# Patient Record
Sex: Male | Born: 1940 | Race: Black or African American | Hispanic: No | Smoking: Former smoker
Health system: Southern US, Community
[De-identification: ages and names within clinical notes are randomized; demographics above are authoritative.]

## PROBLEM LIST (undated history)

## (undated) DIAGNOSIS — H547 Unspecified visual loss: Secondary | ICD-10-CM

## (undated) DIAGNOSIS — E871 Hypo-osmolality and hyponatremia: Secondary | ICD-10-CM

## (undated) DIAGNOSIS — F329 Major depressive disorder, single episode, unspecified: Secondary | ICD-10-CM

## (undated) DIAGNOSIS — E785 Hyperlipidemia, unspecified: Secondary | ICD-10-CM

## (undated) DIAGNOSIS — J189 Pneumonia, unspecified organism: Secondary | ICD-10-CM

## (undated) DIAGNOSIS — N400 Enlarged prostate without lower urinary tract symptoms: Secondary | ICD-10-CM

## (undated) DIAGNOSIS — R600 Localized edema: Secondary | ICD-10-CM

## (undated) DIAGNOSIS — L209 Atopic dermatitis, unspecified: Secondary | ICD-10-CM

## (undated) DIAGNOSIS — M1990 Unspecified osteoarthritis, unspecified site: Secondary | ICD-10-CM

## (undated) DIAGNOSIS — K219 Gastro-esophageal reflux disease without esophagitis: Secondary | ICD-10-CM

## (undated) DIAGNOSIS — E11319 Type 2 diabetes mellitus with unspecified diabetic retinopathy without macular edema: Secondary | ICD-10-CM

## (undated) DIAGNOSIS — F419 Anxiety disorder, unspecified: Secondary | ICD-10-CM

## (undated) DIAGNOSIS — H409 Unspecified glaucoma: Secondary | ICD-10-CM

## (undated) DIAGNOSIS — Z7409 Other reduced mobility: Secondary | ICD-10-CM

## (undated) DIAGNOSIS — B029 Zoster without complications: Secondary | ICD-10-CM

## (undated) DIAGNOSIS — F32A Depression, unspecified: Secondary | ICD-10-CM

## (undated) DIAGNOSIS — I10 Essential (primary) hypertension: Secondary | ICD-10-CM

## (undated) DIAGNOSIS — E559 Vitamin D deficiency, unspecified: Secondary | ICD-10-CM

## (undated) DIAGNOSIS — E1140 Type 2 diabetes mellitus with diabetic neuropathy, unspecified: Secondary | ICD-10-CM

## (undated) DIAGNOSIS — J309 Allergic rhinitis, unspecified: Secondary | ICD-10-CM

## (undated) DIAGNOSIS — R609 Edema, unspecified: Secondary | ICD-10-CM

## (undated) DIAGNOSIS — I340 Nonrheumatic mitral (valve) insufficiency: Secondary | ICD-10-CM

## (undated) DIAGNOSIS — I714 Abdominal aortic aneurysm, without rupture, unspecified: Secondary | ICD-10-CM

## (undated) DIAGNOSIS — E663 Overweight: Secondary | ICD-10-CM

## (undated) DIAGNOSIS — F1010 Alcohol abuse, uncomplicated: Secondary | ICD-10-CM

## (undated) HISTORY — DX: Hyperlipidemia, unspecified: E78.5

## (undated) HISTORY — DX: Overweight: E66.3

## (undated) HISTORY — DX: Atopic dermatitis, unspecified: L20.9

## (undated) HISTORY — DX: Vitamin D deficiency, unspecified: E55.9

## (undated) HISTORY — PX: BACK SURGERY: SHX140

## (undated) HISTORY — DX: Edema, unspecified: R60.9

## (undated) HISTORY — DX: Hypo-osmolality and hyponatremia: E87.1

## (undated) HISTORY — PX: EYE SURGERY: SHX253

## (undated) HISTORY — DX: Type 2 diabetes mellitus with unspecified diabetic retinopathy without macular edema: E11.319

## (undated) HISTORY — DX: Zoster without complications: B02.9

## (undated) HISTORY — DX: Other reduced mobility: Z74.09

## (undated) HISTORY — DX: Type 2 diabetes mellitus with diabetic neuropathy, unspecified: E11.40

## (undated) HISTORY — DX: Abdominal aortic aneurysm, without rupture, unspecified: I71.40

## (undated) HISTORY — PX: PROSTATE SURGERY: SHX751

## (undated) HISTORY — DX: Essential (primary) hypertension: I10

## (undated) HISTORY — DX: Benign prostatic hyperplasia without lower urinary tract symptoms: N40.0

## (undated) HISTORY — DX: Unspecified visual loss: H54.7

## (undated) HISTORY — DX: Localized edema: R60.0

## (undated) HISTORY — DX: Allergic rhinitis, unspecified: J30.9

## (undated) HISTORY — DX: Nonrheumatic mitral (valve) insufficiency: I34.0

## (undated) HISTORY — DX: Unspecified glaucoma: H40.9

---

## 1998-07-25 ENCOUNTER — Encounter: Payer: Self-pay | Admitting: *Deleted

## 1998-07-25 ENCOUNTER — Ambulatory Visit (HOSPITAL_COMMUNITY): Admission: RE | Admit: 1998-07-25 | Discharge: 1998-07-25 | Payer: Self-pay | Admitting: *Deleted

## 1998-08-01 ENCOUNTER — Ambulatory Visit (HOSPITAL_COMMUNITY): Admission: RE | Admit: 1998-08-01 | Discharge: 1998-08-01 | Payer: Self-pay | Admitting: Neurosurgery

## 1998-08-01 ENCOUNTER — Encounter: Payer: Self-pay | Admitting: Neurosurgery

## 1998-08-02 ENCOUNTER — Inpatient Hospital Stay (HOSPITAL_COMMUNITY): Admission: RE | Admit: 1998-08-02 | Discharge: 1998-08-07 | Payer: Self-pay | Admitting: Neurosurgery

## 1998-08-03 ENCOUNTER — Encounter: Payer: Self-pay | Admitting: Neurosurgery

## 1998-08-23 ENCOUNTER — Encounter: Admission: RE | Admit: 1998-08-23 | Discharge: 1998-11-21 | Payer: Self-pay | Admitting: Internal Medicine

## 2001-02-25 ENCOUNTER — Ambulatory Visit (HOSPITAL_COMMUNITY): Admission: RE | Admit: 2001-02-25 | Discharge: 2001-02-25 | Payer: Self-pay | Admitting: Gastroenterology

## 2009-12-30 ENCOUNTER — Encounter: Payer: Self-pay | Admitting: Physician Assistant

## 2009-12-30 ENCOUNTER — Emergency Department (HOSPITAL_COMMUNITY): Admission: EM | Admit: 2009-12-30 | Discharge: 2009-12-30 | Payer: Self-pay | Admitting: Emergency Medicine

## 2009-12-30 LAB — CONVERTED CEMR LAB
HCT: 44.7 %
Hemoglobin: 15.2 g/dL
Platelets: 199 10*3/uL
WBC: 5.8 10*3/uL

## 2010-02-07 ENCOUNTER — Ambulatory Visit: Payer: Self-pay | Admitting: Physician Assistant

## 2010-02-07 ENCOUNTER — Telehealth (INDEPENDENT_AMBULATORY_CARE_PROVIDER_SITE_OTHER): Payer: Self-pay | Admitting: *Deleted

## 2010-02-07 DIAGNOSIS — E785 Hyperlipidemia, unspecified: Secondary | ICD-10-CM | POA: Insufficient documentation

## 2010-02-07 DIAGNOSIS — I10 Essential (primary) hypertension: Secondary | ICD-10-CM | POA: Insufficient documentation

## 2010-02-07 DIAGNOSIS — M48061 Spinal stenosis, lumbar region without neurogenic claudication: Secondary | ICD-10-CM

## 2010-02-07 DIAGNOSIS — F1010 Alcohol abuse, uncomplicated: Secondary | ICD-10-CM | POA: Insufficient documentation

## 2010-02-07 DIAGNOSIS — E119 Type 2 diabetes mellitus without complications: Secondary | ICD-10-CM | POA: Insufficient documentation

## 2010-02-07 LAB — CONVERTED CEMR LAB: Hgb A1c MFr Bld: >14 %

## 2010-02-09 LAB — CONVERTED CEMR LAB
ALT: 14 units/L (ref 0–53)
AST: 20 units/L (ref 0–37)
Albumin: 4.3 g/dL (ref 3.5–5.2)
Alkaline Phosphatase: 58 units/L (ref 39–117)
BUN: 14 mg/dL (ref 6–23)
CO2: 24 meq/L (ref 19–32)
Calcium: 9.4 mg/dL (ref 8.4–10.5)
Chloride: 99 meq/L (ref 96–112)
Creatinine, Ser: 0.82 mg/dL (ref 0.40–1.50)
Free T4: 1.43 ng/dL (ref 0.80–1.80)
Glucose, Bld: 329 mg/dL — ABNORMAL HIGH (ref 70–99)
Potassium: 3.6 meq/L (ref 3.5–5.3)
Sodium: 138 meq/L (ref 135–145)
TSH: 2.366 microintl units/mL (ref 0.350–4.500)
Total Bilirubin: 0.6 mg/dL (ref 0.3–1.2)
Total Protein: 6.8 g/dL (ref 6.0–8.3)

## 2010-02-11 ENCOUNTER — Ambulatory Visit: Payer: Self-pay | Admitting: Physician Assistant

## 2010-02-11 ENCOUNTER — Ambulatory Visit (HOSPITAL_COMMUNITY): Admission: RE | Admit: 2010-02-11 | Discharge: 2010-02-11 | Payer: Self-pay | Admitting: Internal Medicine

## 2010-02-14 ENCOUNTER — Telehealth: Payer: Self-pay | Admitting: Physician Assistant

## 2010-02-14 DIAGNOSIS — D179 Benign lipomatous neoplasm, unspecified: Secondary | ICD-10-CM | POA: Insufficient documentation

## 2010-02-18 ENCOUNTER — Ambulatory Visit: Payer: Self-pay | Admitting: Physician Assistant

## 2010-02-20 ENCOUNTER — Telehealth (INDEPENDENT_AMBULATORY_CARE_PROVIDER_SITE_OTHER): Payer: Self-pay | Admitting: *Deleted

## 2010-02-27 ENCOUNTER — Encounter: Payer: Self-pay | Admitting: Physician Assistant

## 2010-02-28 ENCOUNTER — Telehealth: Payer: Self-pay | Admitting: Physician Assistant

## 2010-03-04 ENCOUNTER — Encounter: Payer: Self-pay | Admitting: Physician Assistant

## 2010-03-07 ENCOUNTER — Ambulatory Visit: Payer: Self-pay | Admitting: Physician Assistant

## 2010-03-07 LAB — CONVERTED CEMR LAB
BUN: 11 mg/dL (ref 6–23)
Blood Glucose, Fingerstick: 116
CO2: 28 meq/L (ref 19–32)
Calcium: 9.4 mg/dL (ref 8.4–10.5)
Chloride: 102 meq/L (ref 96–112)
Creatinine, Ser: 0.88 mg/dL (ref 0.40–1.50)
Glucose, Bld: 108 mg/dL — ABNORMAL HIGH (ref 70–99)
Potassium: 3.8 meq/L (ref 3.5–5.3)
Sodium: 144 meq/L (ref 135–145)

## 2010-03-10 ENCOUNTER — Encounter: Payer: Self-pay | Admitting: Physician Assistant

## 2010-03-11 ENCOUNTER — Encounter: Payer: Self-pay | Admitting: Physician Assistant

## 2010-03-12 ENCOUNTER — Encounter (INDEPENDENT_AMBULATORY_CARE_PROVIDER_SITE_OTHER): Payer: Self-pay | Admitting: Nurse Practitioner

## 2010-03-17 ENCOUNTER — Ambulatory Visit: Payer: Self-pay | Admitting: Physician Assistant

## 2010-03-18 DIAGNOSIS — E876 Hypokalemia: Secondary | ICD-10-CM | POA: Insufficient documentation

## 2010-03-18 LAB — CONVERTED CEMR LAB
BUN: 12 mg/dL (ref 6–23)
CO2: 26 meq/L (ref 19–32)
Calcium: 9.6 mg/dL (ref 8.4–10.5)
Chloride: 99 meq/L (ref 96–112)
Creatinine, Ser: 0.84 mg/dL (ref 0.40–1.50)
Glucose, Bld: 163 mg/dL — ABNORMAL HIGH (ref 70–99)
Potassium: 3.4 meq/L — ABNORMAL LOW (ref 3.5–5.3)
Sodium: 138 meq/L (ref 135–145)

## 2010-03-31 ENCOUNTER — Ambulatory Visit: Payer: Self-pay | Admitting: Physician Assistant

## 2010-03-31 ENCOUNTER — Telehealth (INDEPENDENT_AMBULATORY_CARE_PROVIDER_SITE_OTHER): Payer: Self-pay | Admitting: Nurse Practitioner

## 2010-04-01 LAB — CONVERTED CEMR LAB
BUN: 10 mg/dL (ref 6–23)
CO2: 31 meq/L (ref 19–32)
Calcium: 9.2 mg/dL (ref 8.4–10.5)
Chloride: 95 meq/L — ABNORMAL LOW (ref 96–112)
Creatinine, Ser: 0.83 mg/dL (ref 0.40–1.50)
Glucose, Bld: 116 mg/dL — ABNORMAL HIGH (ref 70–99)
Potassium: 3.1 meq/L — ABNORMAL LOW (ref 3.5–5.3)
Sodium: 139 meq/L (ref 135–145)

## 2010-04-04 ENCOUNTER — Telehealth (INDEPENDENT_AMBULATORY_CARE_PROVIDER_SITE_OTHER): Payer: Self-pay | Admitting: Internal Medicine

## 2010-04-14 ENCOUNTER — Encounter (INDEPENDENT_AMBULATORY_CARE_PROVIDER_SITE_OTHER): Payer: Self-pay | Admitting: General Surgery

## 2010-04-14 ENCOUNTER — Ambulatory Visit (HOSPITAL_COMMUNITY)
Admission: RE | Admit: 2010-04-14 | Discharge: 2010-04-15 | Payer: Self-pay | Source: Home / Self Care | Admitting: General Surgery

## 2010-04-18 ENCOUNTER — Encounter (INDEPENDENT_AMBULATORY_CARE_PROVIDER_SITE_OTHER): Payer: Self-pay | Admitting: Nurse Practitioner

## 2010-04-22 ENCOUNTER — Encounter (INDEPENDENT_AMBULATORY_CARE_PROVIDER_SITE_OTHER): Payer: Self-pay | Admitting: Internal Medicine

## 2010-05-12 ENCOUNTER — Ambulatory Visit: Payer: Self-pay | Admitting: Nurse Practitioner

## 2010-05-12 DIAGNOSIS — G609 Hereditary and idiopathic neuropathy, unspecified: Secondary | ICD-10-CM

## 2010-05-12 LAB — CONVERTED CEMR LAB
Bilirubin Urine: NEGATIVE
Blood Glucose, Fingerstick: 121
Blood in Urine, dipstick: NEGATIVE
Cholesterol, target level: 200 mg/dL
Glucose, Urine, Semiquant: NEGATIVE
HDL goal, serum: 40 mg/dL
Ketones, urine, test strip: NEGATIVE
LDL Goal: 100 mg/dL
Nitrite: NEGATIVE
Protein, U semiquant: NEGATIVE
Specific Gravity, Urine: 1.01
Urobilinogen, UA: 0.2
WBC Urine, dipstick: NEGATIVE
pH: 7.5

## 2010-05-13 LAB — CONVERTED CEMR LAB
Cholesterol: 154 mg/dL (ref 0–200)
HDL: 46 mg/dL (ref >39)
Hgb A1c MFr Bld: 7 % — ABNORMAL HIGH (ref <5.7)
LDL Cholesterol: 88 mg/dL (ref 0–99)
Microalb, Ur: 0.99 mg/dL (ref 0.00–1.89)
Total CHOL/HDL Ratio: 3.3
Triglycerides: 102 mg/dL (ref <150)
VLDL: 20 mg/dL (ref 0–40)

## 2010-05-15 ENCOUNTER — Encounter (INDEPENDENT_AMBULATORY_CARE_PROVIDER_SITE_OTHER): Payer: Self-pay | Admitting: Nurse Practitioner

## 2010-05-27 ENCOUNTER — Inpatient Hospital Stay (HOSPITAL_COMMUNITY)
Admission: RE | Admit: 2010-05-27 | Discharge: 2010-05-30 | Payer: Self-pay | Source: Home / Self Care | Attending: Neurosurgery | Admitting: Neurosurgery

## 2010-06-03 ENCOUNTER — Encounter (INDEPENDENT_AMBULATORY_CARE_PROVIDER_SITE_OTHER): Payer: Self-pay | Admitting: Nurse Practitioner

## 2010-06-11 ENCOUNTER — Encounter (INDEPENDENT_AMBULATORY_CARE_PROVIDER_SITE_OTHER): Payer: Self-pay | Admitting: Nurse Practitioner

## 2010-06-19 ENCOUNTER — Ambulatory Visit
Admission: RE | Admit: 2010-06-19 | Discharge: 2010-06-20 | Payer: Self-pay | Source: Home / Self Care | Attending: Urology | Admitting: Urology

## 2010-06-19 DIAGNOSIS — N401 Enlarged prostate with lower urinary tract symptoms: Secondary | ICD-10-CM | POA: Insufficient documentation

## 2010-06-19 LAB — GLUCOSE, CAPILLARY: Glucose-Capillary: 134 mg/dL — ABNORMAL HIGH (ref 70–99)

## 2010-06-19 LAB — POCT I-STAT 4, (NA,K, GLUC, HGB,HCT)
Glucose, Bld: 121 mg/dL — ABNORMAL HIGH (ref 70–99)
HCT: 38 % — ABNORMAL LOW (ref 39.0–52.0)
Hemoglobin: 12.9 g/dL — ABNORMAL LOW (ref 13.0–17.0)
Potassium: 3.8 mEq/L (ref 3.5–5.1)
Sodium: 129 mEq/L — ABNORMAL LOW (ref 135–145)

## 2010-06-20 ENCOUNTER — Encounter (INDEPENDENT_AMBULATORY_CARE_PROVIDER_SITE_OTHER): Payer: Self-pay | Admitting: Nurse Practitioner

## 2010-06-20 LAB — GLUCOSE, CAPILLARY: Glucose-Capillary: 124 mg/dL — ABNORMAL HIGH (ref 70–99)

## 2010-06-20 LAB — BASIC METABOLIC PANEL
BUN: 10 mg/dL (ref 6–23)
CO2: 29 mEq/L (ref 19–32)
Calcium: 8.6 mg/dL (ref 8.4–10.5)
Chloride: 93 mEq/L — ABNORMAL LOW (ref 96–112)
Creatinine, Ser: 0.83 mg/dL (ref 0.4–1.5)
GFR calc Af Amer: >60 mL/min (ref >60)
GFR calc non Af Amer: >60 mL/min (ref >60)
Glucose, Bld: 100 mg/dL — ABNORMAL HIGH (ref 70–99)
Potassium: 3.9 mEq/L (ref 3.5–5.1)
Sodium: 128 mEq/L — ABNORMAL LOW (ref 135–145)

## 2010-06-21 ENCOUNTER — Emergency Department (HOSPITAL_COMMUNITY)
Admission: EM | Admit: 2010-06-21 | Discharge: 2010-06-21 | Payer: Self-pay | Source: Home / Self Care | Admitting: Emergency Medicine

## 2010-06-27 ENCOUNTER — Encounter (INDEPENDENT_AMBULATORY_CARE_PROVIDER_SITE_OTHER): Payer: Self-pay | Admitting: Nurse Practitioner

## 2010-06-27 DIAGNOSIS — R220 Localized swelling, mass and lump, head: Secondary | ICD-10-CM | POA: Insufficient documentation

## 2010-06-27 DIAGNOSIS — R221 Localized swelling, mass and lump, neck: Secondary | ICD-10-CM | POA: Insufficient documentation

## 2010-07-17 NOTE — Progress Notes (Signed)
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 I don't like how it is wrapped around and next to certain important structures in his neck. I want him to see a surgeon to evaluate this. Referral in system. Send to Ar[facility] Medici once he is notified.  Initial call taken by: Tereso Newcomer PA-C,  February 14, 2010 1:24 PM  Follow-up for Phone Call        Left message for the pt. to call the office... Linzie Collin CMA  Student February 14, 2010 5:12 PM   pt is aware... [place] Shannon  February 18, 2010 4:00 PM    New Problems: LIPOMA (ICD-214.9)   New Problems: LIPOMA (ICD-214.9)     Past History:  Past Medical History: Diabetes mellitus, type II Hypertension Spinal Stenosis Hyperlipidemia ? h/o Colon Cancer   a.  first told he had cancer then had repeat colo and was told he did not have cancer   b.  record of colo done in 2002 avail; + hem's; no mention of polyps   c.  states last colo 2007(?) Large right neck lipoma   a.  CT 01/2010: Large lipoma in the right neck.  This appears to be an intramuscular lipoma of the        sternocleidomastoid muscle and appears to be a simple lipoma.  No soft tissue mass.   Impression & Recommendations:  Problem # 1:  LIPOMA (ICD-214.9)  refer to surgery  Orders: Surgical Referral (Surgery)  Complete Medication List: 1)  Metformin Hcl 500 Mg Tabs (Metformin hcl) .... Take 1 tablet by mouth two times a day for diabetes 2)  Lantus 100 Unit/ml Soln (Insulin glargine) .... Inject 10 units at bedtime for diabetes 3)  Insulin Syringes  .... Inject as directed 4)  Glucose Meter  .... Check sugar three times a day before each meal 5)  Glucose Meter Strips  .... Check sugar three times a day before each meal 6)  Lancets  .... Check sugar three times a day 7)  Lisinopril 20 Mg Tabs (Lisinopril) .... Take 1 tablet by mouth once a day for blood  pressure 8)  Oxycodone Hcl 5 Mg Tabs (Oxycodone hcl) .... Take 1-2 tabs every 8 hours as needed for severe pain

## 2010-07-17 NOTE — Letter (Signed)
Summary: OFFICE NOTE DR. FAERA BYERLY  OFFICE NOTE DR. FAERA BYERLY   Imported By: Arta Bruce 03/24/2010 10:01:35  _____________________________________________________________________  External Attachment:    Type:   Image     Comment:   External Document

## 2010-07-17 NOTE — Letter (Signed)
Summary: TEST ORDER FORM  TEST ORDER FORM   Imported By: Arta Bruce 02/07/2010 14:34:55  _____________________________________________________________________  External Attachment:    Type:   Image     Comment:   External Document

## 2010-07-17 NOTE — Medication Information (Signed)
Summary: [facility]   Imported By: Arta Bruce 03/10/2010 10:50:13  _____________________________________________________________________  External Attachment:    Type:   Image     Comment:   External Document

## 2010-07-17 NOTE — Progress Notes (Signed)
  Phone Note From Pharmacy   Caller: [facility] Summary of Call: Request for refill of KCl, though should have refills--will send in new Rx.  Looks like pt. received a written Rx and did not take in--please call him and have him toss the one he has if still in his possession Initial call taken by: Julieanne Manson MD,  April 04, 2010 9:13 AM  Follow-up for Phone Call        spoke with pt and he said that he pick up his med from the pharamcy yesterday Follow-up by: [place] Shannon,  April 04, 2010 9:45 AM    Prescriptions: KLOR-CON M20 20 MEQ CR-TABS (POTASSIUM CHLORIDE CRYS CR) Take 1 tablet by mouth once a day  #30 x 3   Entered and Authorized by:   Julieanne Manson MD   Signed by:   Julieanne Manson MD on 04/04/2010   Method used:   Electronically to        [facility] (retail)       [address]       [place], [place]  [id number]       Ph: [phone]       Fax: [phone]   RxID:   [id number]

## 2010-07-17 NOTE — Assessment & Plan Note (Signed)
Summary: Diabetes/HTN   Vital Signs:  Patient profile:   70 year old male Height:      69.25 inches Weight:      180.0 pounds BMI:     26.49 BSA:     1.98 Temp:     97.3 degrees F oral Pulse rate:   112 / minute Pulse rhythm:   regular Resp:     20 per minute BP sitting:   122 / 90  (left arm) Cuff size:   large  Vitals Entered By: Levon Hedger (May 12, 2010 11:47 AM)  Nutrition Counseling: Patient's BMI is greater than 25 and therefore counseled on weight management options. CC: follow-up visit BP, DM, Hypertension Management, Lipid Management Is Patient Diabetic? Yes Pain Assessment Patient in pain? no      CBG Result 121 CBG Device ID B  Does patient need assistance? Functional Status Self care Ambulation Normal  Diabetic Foot Exam Last Podiatry Exam Date: 03/07/2010 Foot Inspection Is there a history of a foot ulcer?              No Is there a foot ulcer now?              No Can the patient see the bottom of their feet?          No Are the shoes appropriate in style and fit?          Yes Is there swelling or an abnormal foot shape?          No Are the toenails long?                Yes Are the toenails thick?                Yes Are the toenails ingrown?              No Is there heavy callous build-up?              No Is there pain in the calf muscle (Intermittent claudication) when walking?    NoIs there a claw toe deformity?              No Is there elevated skin temperature?            No Is there limited ankle dorsiflexion?            No Is there foot or ankle muscle weakness?            No  Diabetic Foot Care Education Patient educated on appropriate care of diabetic feet.  Pulse Check          Right Foot          Left Foot Dorsalis Pedis:        normal            normal  High Risk Feet? Yes   10-g (5.07) Semmes-Weinstein Monofilament Test Performed by: Levon Hedger          Right Foot          Left Foot Visual Inspection                  Primary Care Vertis Bauder:  Tereso Newcomer, PA-C  CC:  follow-up visit BP, DM, Hypertension Management, and Lipid Management.  History of Present Illness:  Pt into the office for diabetes and htn  s/p lipoma removal about 3 weeks ago. healing well  Diabetes Management History:      The patient is  a 70 years old male who comes in for evaluation of DM Type 2.  He has not been enrolled in the "Diabetic Education Program".  He states understanding of dietary principles and is following his diet appropriately.  No sensory loss is reported.  Self foot exams are not being performed.  He is checking home blood sugars.        Other comments include: Pt is checking blood sugar twice daily and in the morning.        No changes have been made to his treatment plan since last visit.    Hypertension History:      He denies headache, chest pain, and palpitations.  He notes no problems with any antihypertensive medication side effects.        Positive major cardiovascular risk factors include male age 68 years old or older, diabetes, hyperlipidemia, and hypertension.  Negative major cardiovascular risk factors include non-tobacco-user status.        Further assessment for target organ damage reveals no history of peripheral vascular disease.    Lipid Management History:      Positive NCEP/ATP III risk factors include male age 4 years old or older, diabetes, and hypertension.  Negative NCEP/ATP III risk factors include non-tobacco-user status and no peripheral vascular disease.        The patient states that he does not know about the "Therapeutic Lifestyle Change" diet.  The patient does not know about adjunctive measures for cholesterol lowering.  He expresses no side effects from his lipid-lowering medication.  The patient denies any symptoms to suggest myopathy or liver disease.      Habits & Providers  Alcohol-Tobacco-Diet     Alcohol drinks/day: 0     Alcohol Counseling: to decrease amount and/or  frequency of alcohol intake     Tobacco Status: quit  Exercise-Depression-Behavior     Have you felt down or hopeless? no     Have you felt little pleasure in things? no     Drug Use: no  Allergies: 1)  ! Penicillin G Potassium (Penicillin G Potassium)  Review of Systems General:  Denies fever. CV:  Denies chest pain or discomfort. Resp:  Denies cough. GI:  Denies abdominal pain, nausea, and vomiting. MS:  Complains of low back pain; pt is going to have back surgery on next month - by Dr. Channing Mutters at [facility]. Neuro:  Complains of numbness and tingling; bilateral feet.  Physical Exam  General:  alert.   Head:  normocephalic.   Mouth:  poor dentition.   Lungs:  normal breath sounds.   Heart:  normal rate and regular rhythm.   Neurologic:  cane use Skin:  right - carotid healed incision Psych:  Oriented X3.    Diabetes Management Exam:    Foot Exam (with socks and/or shoes not present):       Sensory-Monofilament:          Left foot: abnormal          Right foot: abnormal       Nails:          Left foot: normal          Right foot: normal   Impression & Recommendations:  Problem # 1:  DIABETES MELLITUS, TYPE II (ICD-250.00) will check hgba1c may need titration of meds ? neuropathy due to uncontrolled blood sugar His updated medication list for this problem includes:    Metformin Hcl 500 Mg Tabs (Metformin hcl) .[name]... Take 1 tablet by  mouth two times a day for diabetes    Lantus 100 Unit/ml Soln (Insulin glargine) ..... Inject 10 units at bedtime for diabetes    Lisinopril 20 Mg Tabs (Lisinopril) .[name]... Take 1 tablet by mouth once a day for blood pressure  Orders: UA Dipstick w/o Micro (manual) (29528) T- Hemoglobin A1C (41324-40102)  Problem # 2:  HYPERTENSION (ICD-401.9) BP is stable continue current meds His updated medication list for this problem includes:    Lisinopril 20 Mg Tabs (Lisinopril) .[name]... Take 1 tablet by mouth once a day for blood pressure     Hydrochlorothiazide 25 Mg Tabs (Hydrochlorothiazide) .[name]... 1 tab by mouth once daily for blood pressure  Problem # 3:  HYPERLIPIDEMIA (ICD-272.4) will check labs today Orders: T-Lipid Profile ([id number]) T-Urine Microalbumin w/creat. 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 Take 1 tablet by mouth two times a day for diabetes 2)  Lantus 100 Unit/ml Soln (Insulin glargine) .... Inject 10 units at bedtime for diabetes 3)  Insulin Syringes  .... Inject as directed 4)  Glucose Meter  .... Check sugar three times a day before each meal 5)  Glucose Meter Strips  .... Check sugar three times a day before each meal 6)  Lancets  .... Check sugar three times a day 7)  Lisinopril 20 Mg Tabs (Lisinopril) .... Take 1 tablet by mouth once a day for blood pressure 8)  Oxycodone Hcl 5 Mg Tabs (Oxycodone hcl) .... Take 1-2 tabs every 8 hours as needed for severe pain 9)  Hydrochlorothiazide 25 Mg Tabs (Hydrochlorothiazide) .[name].. 1 tab by mouth once daily for blood pressure 10)  Klor-con M20 20 Meq Cr-tabs (Potassium chloride crys cr) .... Take 1 tablet by mouth once a day  Diabetes Management Assessment/Plan:      The following lipid goals have been established for the patient: Total cholesterol goal of 200; LDL cholesterol goal of 100; HDL cholesterol goal of 40; Triglyceride goal of 150.  His blood pressure goal is < 130/80.    Hypertension Assessment/Plan:      The patient's hypertensive risk group is category C: Target organ damage and/or diabetes.  Today's blood pressure is 122/90.  His blood pressure goal is < 130/80.  Lipid Assessment/Plan:      Based on NCEP/ATP III, the patient's risk factor category is "history of diabetes".  The patient's lipid goals are as follows: Total cholesterol goal is 200;  LDL cholesterol goal is 100; HDL cholesterol goal is 40; Triglyceride goal is 150.    Patient Instructions: 1)  Diabetes - your Hgba1c (long term blood sugar value) will be checked today and you will be notified of the results.  The goal is less than 7.  If still elevated you will be notified of a medication change 2)  It was 14 back in august 3)  uncontrolled blood sugar may be the cause of your feet tingling - neuropathy - read handout.  Getting blood sugar under good control is key. There are some medications you can take if it continues to be a problems 4)  Blood pressure - doing well today 5)  Keep your appointment for back surgery as ordered 6)  CALL for a follow up appointment in 3 months for diabetes.   Orders Added: 1)  Est. Patient Level IV [59563]  2)  T-Lipid Profile [80061-22930] 3)  T-Urine Microalbumin w/creat. ratio [[id number]] 4)  UA Dipstick w/o Micro (manual) [81002] 5)  T- Hemoglobin A1C [83036-23375]    Laboratory Results   Urine Tests  Date/Time Received: May 12, 2010 12:03 PM   Routine Urinalysis   Color: lt. yellow Appearance: Clear Glucose: negative   (Normal Range: Negative) Bilirubin: negative   (Normal Range: Negative) Ketone: negative   (Normal Range: Negative) Spec. Gravity: 1.010   (Normal Range: 1.003-1.035) Blood: negative   (Normal Range: Negative) pH: 7.5   (Normal Range: 5.0-8.0) Protein: negative   (Normal Range: Negative) Urobilinogen: 0.2   (Normal Range: 0-1) Nitrite: negative   (Normal Range: Negative) Leukocyte Esterace: negative   (Normal Range: Negative)     Blood Tests     CBG Random:: 121            Diabetic Foot Exam Last Podiatry Exam Date: 03/07/2010 Diabetic Foot Care Education :Patient educated on appropriate care of diabetic feet.  Pulse Check          Right Foot          Left Foot Dorsalis Pedis:        normal            normal  High Risk Feet? Yes   10-g (5.07) Semmes-Weinstein Monofilament  Test Performed by: Levon Hedger          Right Foot          Left Foot Visual Inspection               Test Control      abnormal         abnormal Site 1         normal         normal Site 2         normal         abnormal Site 3         abnormal         abnormal Site 4         abnormal         abnormal Site 5         abnormal         abnormal Site 6         abnormal         abnormal Site 7         abnormal         abnormal Site 8         abnormal         abnormal Site 9         abnormal         abnormal Site 10         abnormal         abnormal  Impression      abnormal         abnormal

## 2010-07-17 NOTE — Letter (Signed)
Summary: *HSN Results Follow up  [facility]  [address] [place], [place] [address]   Phone: [phone]  Fax: [phone]      03/11/2010   Donald Calderon [address] [place], [place]  [address]   Dear  Mr. Donald Calderon,                            ____S.Drinkard,FNP   ____D. Gore,FNP       ____B. McPherson,MD   ____V. Rankins,MD    ____E. Mulberry,MD    ____N. Daphine Deutscher, FNP  ____D. Reche Dixon, MD    ____K. Philipp Deputy, MD    __x__S. Alben Spittle, PA-C     This letter is to inform you that your recent test(s):  _______Pap Smear    ___x____Lab Test     _______X-ray    ___x____ is within acceptable limits  _______ requires a medication change  _______ requires a follow-up lab visit  ___x____ requires a follow-up visit with your provider   Comments: Kidney function and potassium ok.  Make sure you come in for your follow up labs in a couple weeks.       _________________________________________________________ If you have any questions, please contact our office                     Sincerely,  Donald Newcomer PA-C [facility]

## 2010-07-17 NOTE — Letter (Signed)
Summary: [facility]  [facility]   Imported By: Arta Bruce 04/18/2010 11:32:51  _____________________________________________________________________  External Attachment:    Type:   Image     Comment:   External Document

## 2010-07-17 NOTE — Letter (Signed)
Summary: AGREEMENT FOR CONTROLL SUBANCES  AGREEMENT FOR CONTROLL SUBANCES   Imported By: Arta Bruce 02/07/2010 14:24:54  _____________________________________________________________________  External Attachment:    Type:   Image     Comment:   External Document

## 2010-07-17 NOTE — Miscellaneous (Signed)
Summary: Dx update  Clinical Lists Changes  Problems: Changed problem from SWELLING MASS OR LUMP IN HEAD AND NECK (ICD-784.2) to SWELLING MASS OR LUMP IN HEAD AND NECK (ICD-784.2) - Removed 05/2010 by Laurelyn Sickle

## 2010-07-17 NOTE — Progress Notes (Signed)
Summary: Surgery appt  Phone Note Outgoing Call   Summary of Call: Pls make sure patient knows about appt 9.20.2011 with [facility] at 2:10 pm, Initial call taken by: Brynda Rim,  February 28, 2010 2:13 PM  Follow-up for Phone Call        Left message on answering machine for pt to call back.Marland KitchenMarland KitchenMarland KitchenArmenia Shannon  February 28, 2010 3:27 PM   pt is aware  Follow-up by: [place] Shannon,  March 03, 2010 12:10 PM

## 2010-07-17 NOTE — Letter (Signed)
Summary: Handout Printed  Printed Handout:  - Neuropathy 

## 2010-07-17 NOTE — Miscellaneous (Signed)
Summary: [facility] Results   Diabetes Management Exam:    Eye Exam:       Eye Exam done elsewhere          Date: 03/12/2010          Results: ? Glaucome          Done by: retasure Phone Note Outgoing Call   Summary of Call: Contact pt and see if anyone every called him regarding his retasure results done 03/12/2010 looks like it was abnormal, ? glaucoma pt should try to schedule an eye exam with an eye doctor in the next 3 months to get this further tested Initial call taken by: Lehman Prom FNP,  April 18, 2010 8:17 AM  Follow-up for Phone Call        pt is aware Follow-up by: [place] Shannon,  April 18, 2010 1:02 PM    Clinical Lists Changes  Observations: Added new observation of EYES COMMENT: 03/2011 (04/18/2010 8:15) Added new observation of EYE EXAM BY: [facility] (03/12/2010 8:16) Added new observation of DMEYEEXMRES: ? Glaucome (03/12/2010 8:16) Added new observation of DIAB EYE EX: ? Glaucome (03/12/2010 8:16)

## 2010-07-17 NOTE — Progress Notes (Signed)
Summary: [facility]  Phone Note Other Incoming   Caller: [facility] Summary of Call: pt had labs drawn on 8/26 they need to verify pt insurance. contact [phone] ext [phone]  Initial call taken by: Levon Hedger,  February 20, 2010 12:38 PM  Follow-up for Phone Call        Pt was self-pay.

## 2010-07-17 NOTE — Consult Note (Signed)
Summary: DR.FAERA BYERLY  DR.FAERA BYERLY   Imported By: Arta Bruce 06/27/2010 14:55:40  _____________________________________________________________________  External Attachment:    Type:   Image     Comment:   External Document

## 2010-07-17 NOTE — Progress Notes (Signed)
Summary: Neurosurgery referral  Phone Note Outgoing Call   Summary of Call: Refer to neurosurgery for spinal stenosis.  Initial call taken by: Brynda Rim,  February 07, 2010 4:16 PM  Follow-up for Phone Call        SEND A REFERRAL WAITING FOR AN APPT   Follow-up by: Cheryll Dessert,  February 11, 2010 11:33 AM

## 2010-07-17 NOTE — Assessment & Plan Note (Signed)
Summary: NEW MEDICARE PT/ DM/ GK   Vital Signs:  Patient profile:   70 year old male Weight:      193 pounds (87.73 kg) Temp:     98.6 degrees F (37.00 degrees C) oral Pulse rate:   72 / minute Pulse rhythm:   regular Resp:     18 per minute BP sitting:   180 / 120  (left arm) Cuff size:   large  Vitals Entered By: Armenia Shannon (February 07, 2010 10:23 AM) CC: np... Is Patient Diabetic? Yes Pain Assessment Patient in pain? no       Does patient need assistance? Functional Status Self care Ambulation Normal   Primary Care Provider:  Tereso Newcomer, PA-C  CC:  np....  History of Present Illness: New Patient. Went to ED recently with back pain.  Has a h/o low back surgery.  Got MRI that demonstrated spinal stenosis.  Has increasingly worse back pain over last 6 monts.  Has pain down back of leg on left in L5-S1 region.  Notes bending forward makes better.  Pain increases the longer he is on his feet.  He denies loss of bowel or bladder function.    No meds in a long time for diabetes or HTN.    Habits & Providers  Alcohol-Tobacco-Diet     Tobacco Status: quit  Exercise-Depression-Behavior     Drug Use: no  Allergies (verified): 1)  ! Penicillin G Potassium (Penicillin G Potassium)  Past History:  Past Medical History: Diabetes mellitus, type II Hypertension Spinal Stenosis Hyperlipidemia ? h/o Colon Cancer   a.  first told he had cancer then had repeat colo and was told he did not have cancer   b.  record of colo done in 2002 avail; + hem's; no mention of polyps   c.  states last colo 2007(?)  Past Surgical History: Lumbar laminectomy   a.  L4-5; 2003; Dr. Channing Mutters   b.  MRI 12/2009:  L4-5 prior laminectomy; mod disc degen; L3-4 mod-severe spinal stenosis s/p crainial surgery by Dr. Phoebe Perch (congenital CSF leak - ?)   a.  no record in [place]  Family History: Family History Diabetes 1st degree relative  Social History: Married Retired prior Curator Former  Smoker Alcohol use-yes   a.  4 shots of whiskey per day   b.  CAGE - + C, G, E Drug use-no Smoking Status:  quit Drug Use:  no  Review of Systems      See HPI General:  Denies fever and sweats. ENT:  Denies difficulty swallowing. CV:  Denies chest pain or discomfort and shortness of breath with exertion. Resp:  Denies cough. GI:  Denies bloody stools. GU:  Denies hematuria. Neuro:  Denies headaches; + dizziness with increased BP. Endo:  Complains of polyuria.  Physical Exam  General:  alert, well-developed, and well-nourished.   Head:  normocephalic and atraumatic.   Eyes:  pupils equal, pupils round, and pupils reactive to light.  diff to visualize fundi bilat  Mouth:  pharynx pink and moist.   Neck:  very large soft mass on right neck smaller soft mass c/w probable lipomas right post scalp  Lungs:  normal breath sounds and no wheezes.   Heart:  normal rate, regular rhythm, and no murmur.   Abdomen:  soft, non-tender, and no hepatomegaly.   Extremities:  trace left pedal edema and trace right pedal edema.   Neurologic:  alert & oriented X3 and cranial nerves II-XII intact.  Psych:  normally interactive and good eye contact.     Impression & Recommendations:  Problem # 1:  DIABETES MELLITUS, TYPE II (ICD-250.00)  no tx for years creat 0.76 in ED in July start back on Metformin also start Lantus Insulin bring back with Susie next week for diet and insulin instruction  His updated medication list for this problem includes:    Metformin Hcl 500 Mg Tabs (Metformin hcl) .[name]... Take 1 tablet by mouth two times a day for diabetes    Lantus 100 Unit/ml Soln (Insulin glargine) ..... Inject 10 units at bedtime for diabetes    Lisinopril 20 Mg Tabs (Lisinopril) .[name]... Take 1 tablet by mouth once a day for blood pressure  Orders: Capillary Blood Glucose/CBG (16109) Hemoglobin A1C (60454) T-Comprehensive Metabolic Panel ([id number])  Problem # 2:  HYPERTENSION  (ICD-401.9)  no tx in years took HCTZ briefly in July creat ok at that time recheck creat today  His updated medication list for this problem includes:    Lisinopril 20 Mg Tabs (Lisinopril) .[name]... 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 Take 1 tablet by mouth two times a day for diabetes 2)  Lantus 100 Unit/ml Soln (Insulin glargine) .... Inject 10 units at bedtime for diabetes 3)  Insulin Syringes  .... Inject as directed 4)  Glucose Meter  .... Check sugar three times a day before each meal 5)  Glucose Meter Strips  .... Check sugar three times a day before each meal 6)  Lancets  .... Check sugar three times a day 7)  Lisinopril 20 Mg Tabs (Lisinopril) .... Take 1 tablet by mouth once a day for blood pressure 8)  Oxycodone Hcl 5 Mg Tabs (Oxycodone hcl) .... Take 1-2 tabs every 8 hours as needed for severe pain  Patient Instructions: 1)  Get records and record of colonoscopy from Dr. Loreta Ave (Gastroenterology). 2)  Get records from Dr. Phoebe Perch regarding surgery done on head/neck approx 8 years ago. 3)   Schedule appt with Susie Piper for Tuesday Aug 30 for diet education and training with Lantus insulin.  Bring your meter and your insulin to your appt. 4)  Come fasting to your next appt for lipid check. 5)  Return to see the nurse in 2 weeks for BP check and BMET (Dx 401.1).  Notify provider if BP > 140/90 or < 110/60. 6)  Sign pain contract. 7)  Schedule appt with Erie Noe at [facility]. for counseling on alcohol. 8)  Please schedule a follow-up appointment in 1 month with Scott for diabetes and blood pressure.  9)  Someone will call you about a referral to Dr.  Phoebe Perch. 10)  You can use Tylenol (Acetaminophen) 500 mg 1-2 tabs every 6 hours as needed for pain. 11)  You can take Ibuprofen 600 mg every 8 hours with food as needed for pain.  Try to limit the amount you take.  It makes your blood pressure go up. 12)  Use the hydrocodone only as needed for severe pain. Prescriptions: OXYCODONE HCL 5 MG TABS (OXYCODONE HCL) Take 1-2 tabs every 8 hours as needed for severe pain  #30 per month x 0   Entered and Authorized by:   Tereso Newcomer PA-C   Signed by:   Tereso Newcomer PA-C on 02/07/2010   Method used:   Print then Give to Patient   RxID:   [id number] LISINOPRIL 20 MG TABS (LISINOPRIL) Take 1 tablet by mouth once a day for blood pressure  #30 x 3   Entered and Authorized by:   Tereso Newcomer PA-C   Signed by:   Tereso Newcomer PA-C on 02/07/2010   Method used:   Print then Give to Patient   RxID:   [id number] LANCETS check sugar three times a day  #1 mo supply x 11   Entered and Authorized by:   Tereso Newcomer PA-C   Signed by:   Tereso Newcomer PA-C on 02/07/2010   Method used:   Print then Give to Patient   RxID:   [id number] GLUCOSE METER STRIPS check sugar three times a day before each meal  #1 mo supply x 11   Entered and Authorized by:   Tereso Newcomer PA-C   Signed by:   Tereso Newcomer PA-C on 02/07/2010   Method used:   Print then Give to Patient   RxID:    [id number] GLUCOSE METER check sugar three times a day before each meal  #1 x 0   Entered and Authorized by:   Tereso Newcomer PA-C   Signed by:   Tereso Newcomer PA-C on 02/07/2010   Method used:   Print then Give to Patient   RxID:   [id number] INSULIN SYRINGES inject as directed  #1 box x 11   Entered and Authorized by:   Tereso Newcomer PA-C   Signed by:   Tereso Newcomer PA-C on 02/07/2010   Method used:   Print then Give to Patient   RxID:   [id number] LANTUS 100 UNIT/ML SOLN (INSULIN GLARGINE) inject 10 units at bedtime for diabetes  #1 mo supply x 3   Entered and Authorized by:   Tereso Newcomer PA-C   Signed by:   Tereso Newcomer PA-C on 02/07/2010   Method used:   Print then Give to Patient   RxID:   [id number] METFORMIN HCL 500 MG TABS (METFORMIN HCL) Take 1 tablet by mouth two times a day for diabetes  #60 x 3   Entered and Authorized by:   Tereso Newcomer PA-C   Signed by:   Tereso Newcomer PA-C on 02/07/2010   Method used:   Print then Give to Patient   RxID:   [id number]   Laboratory Results   Blood Tests     HGBA1C: >14%   (Normal Range: Non-Diabetic - 3-6%   Control Diabetic - 6-8%)

## 2010-07-17 NOTE — Letter (Signed)
Summary: [facility]  [facility]   Imported By: Arta Bruce 02/10/2010 16:47:58  _____________________________________________________________________  External Attachment:    Type:   Image     Comment:   External Document

## 2010-07-17 NOTE — Letter (Addendum)
Summary: NUTRITION SUMMARY//SUSIE  NUTRITION SUMMARY//SUSIE   Imported By: Arta Bruce 03/10/2010 15:54:26  _____________________________________________________________________  External Attachments:     1. Type:   Image          Comment:   External Document    2. Type:   Image          Comment:   External Document

## 2010-07-17 NOTE — Assessment & Plan Note (Signed)
Summary:  BP and BMET dx 401.9  Nurse Visit   Vital Signs:  Patient profile:   70 year old male Pulse rate:   96 / minute Pulse rhythm:   regular Resp:     20 per minute BP sitting:   144 / 96  (left arm) Cuff size:   large  Vitals Entered By: Dutch Quint RN (March 17, 2010 9:27 AM)  Patient Instructions: 1)  Reviewed with Wende Mott 2)  Your blood pressure today was 144/96, which is still elevated 3)  Increase your hydrochlorothiazine to one full tablet by mouth once daily. 4)  Continue your other medications as ordered. 5)  We will call with results of your lab work. 6)  Return for blood pressure check and lab work in two weeks with triage nurse. 7)  Call if anything changes or if you have any questions.   Physical Exam  Lungs:  normal respiratory effort, normal breath sounds, no crackles, and no wheezes.   Heart:  normal rate and regular rhythm.     Primary Care Provider:  Tereso Newcomer, PA-C  CC:  BP check and BMET.  History of Present Illness: BP check and BMET.  Last visit, 03/07/10 BP was 140/86.  Currently, has some cough, nonproductive, comes usually in the morning, then subsides.  Also having back/leg pain on right.  Takes hydrocodone when pain gets bad.  Was supposed to bring in personal BP monitor but forgot it -- takes BP in the morning, around noon and in the evening.  Is having surgery 04/14/10 to have a right neck mass removal at [facility] with Dr. Donell Beers.   Review of Systems CV:  Complains of leg cramps with exertion and shortness of breath with exertion; denies bluish discoloration of lips or nails, chest pain or discomfort, difficulty breathing at night, difficulty breathing while lying down, fainting, fatigue, lightheadness, near fainting, palpitations, swelling of feet, swelling of hands, and weight gain; leg cramps start at ankle, radiates to feet.  Does complain of some coughing, denies headache, does have some haziness to vision at timesl..   Impression  & Recommendations:  Problem # 1:  HYPERTENSION (ICD-401.9) BMET done BP 144/86 Increase HCTZ to one full tablet by mouth once daily. Return for BP check and BMET in two weeks with triage nurse.   His updated medication list for this problem includes:    Lisinopril 20 Mg Tabs (Lisinopril) .[name]... Take 1 tablet by mouth once a day for blood pressure    Hydrochlorothiazide 25 Mg Tabs (Hydrochlorothiazide) .[name]... 1 tab by mouth once daily for blood pressure  Orders: Est. Patient Level I (16109) T-Basic Metabolic Panel (60454-09811)  Complete Medication List: 1)  Metformin Hcl 500 Mg Tabs (Metformin hcl) .... Take 1 tablet by mouth two times a day for diabetes 2)  Lantus 100 Unit/ml Soln (Insulin glargine) .... Inject 10 units at bedtime for diabetes 3)  Insulin Syringes  .... Inject as directed 4)  Glucose Meter  .... Check sugar three times a day before each meal 5)  Glucose Meter Strips  .... Check sugar three times a day before each meal 6)  Lancets  .... Check sugar three times a day 7)  Lisinopril 20 Mg Tabs (Lisinopril) .... Take 1 tablet by mouth once a day for blood pressure 8)  Oxycodone Hcl 5 Mg Tabs (Oxycodone hcl) .... Take 1-2 tabs every 8 hours as needed for severe pain 9)  Hydrochlorothiazide 25 Mg Tabs (Hydrochlorothiazide) .[name].. 1 tab by  mouth once daily for blood pressure  CC: BP check and BMET Is Patient Diabetic? Yes Did you bring your meter with you today? Yes Pain Assessment Patient in pain? yes     Location: back, down back of leg to foot Intensity: 10 Type: dull, sometimes shooting pain  Does patient need assistance? Functional Status Self care Ambulation Impaired:Risk for fall Comments Uses cane with ambulation   Allergies: 1)  ! Penicillin G Potassium (Penicillin G Potassium)  Orders Added: 1)  Est. Patient Level I [57846] 2)  T-Basic Metabolic Panel [[id number]]  Appended Document:  BP and BMET dx 401.9 Notify provider if BP . 140/90.  Dutch Quint RN  March 17, 2010 11:52 AM

## 2010-07-17 NOTE — Letter (Signed)
Summary: FAXED REQUESTED RECORDS TO [facility]  FAXED REQUESTED RECORDS TO [facility]   Imported By: Arta Bruce 03/03/2010 14:25:23  _____________________________________________________________________  External Attachment:    Type:   Image     Comment:   External Document

## 2010-07-17 NOTE — Letter (Signed)
Summary: NUTRITIONIST/SUSIE  NUTRITIONIST/SUSIE   Imported By: Arta Bruce 02/19/2010 12:13:59  _____________________________________________________________________  External Attachment:    Type:   Image     Comment:   External Document

## 2010-07-17 NOTE — Assessment & Plan Note (Signed)
Summary: DM and HTN   Vital Signs:  Patient profile:   70 year old male Weight:      191 pounds Temp:     98.5 degrees F oral Pulse rate:   76 / minute Pulse rhythm:   regular Resp:     18 per minute BP sitting:   140 / 86  (left arm) Cuff size:   large  Vitals Entered By: Armenia Shannon (March 07, 2010 9:12 AM) CC: f/u dm and htn... med reviewed Is Patient Diabetic? Yes Pain Assessment Patient in pain? no      CBG Result 116  Does patient need assistance? Functional Status Self care Ambulation Normal   Primary Care Waynesha Rammel:  Tereso Newcomer, PA-C  CC:  f/u dm and htn... med reviewed.  History of Present Illness: Here for f/u. Taking all meds.  Sugars were in 300 range at first.  In last 2 weeks, has reading mainly under 200.  Last few days avg near 150.  No hypoglycemic episodes. Notes some polydipsia and polyuria.  No nausea or abd pain. Feels good except for back pain.  Needs refill on oxycodone. Has appt with neurosurg in 2 mos. Saw surgeon for lipoma.  Has appt to discuss resection.  Has dysphagia sometimes.  Has hoarse voice as well.   Problems Prior to Update: 1)  Lipoma  (ICD-214.9) 2)  Swelling Mass or Lump in Head and Neck  (ICD-784.2) 3)  Spinal Stenosis, Lumbar  (ICD-724.02) 4)  Alcohol Abuse  (ICD-305.00) 5)  Family History Diabetes 1st Degree Relative  (ICD-V18.0) 6)  Hyperlipidemia  (ICD-272.4) 7)  Hypertension  (ICD-401.9) 8)  Diabetes Mellitus, Type II  (ICD-250.00)  Current Medications (verified): 1)  Metformin Hcl 500 Mg Tabs (Metformin Hcl) .... Take 1 Tablet By Mouth Two Times A Day For Diabetes 2)  Lantus 100 Unit/ml Soln (Insulin Glargine) .... Inject 10 Units At Bedtime For Diabetes 3)  Insulin Syringes .... Inject As Directed 4)  Glucose Meter .... Check Sugar Three Times A Day Before Each Meal 5)  Glucose Meter Strips .... Check Sugar Three Times A Day Before Each Meal 6)  Lancets .... Check Sugar Three Times A Day 7)  Lisinopril  20 Mg Tabs (Lisinopril) .... Take 1 Tablet By Mouth Once A Day For Blood Pressure 8)  Oxycodone Hcl 5 Mg Tabs (Oxycodone Hcl) .... Take 1-2 Tabs Every 8 Hours As Needed For Severe Pain  Allergies (verified): 1)  ! Penicillin G Potassium (Penicillin G Potassium)  Past History:  Past Medical History: Last updated: 02/14/2010 Diabetes mellitus, type II Hypertension Spinal Stenosis Hyperlipidemia ? h/o Colon Cancer   a.  first told he had cancer then had repeat colo and was told he did not have cancer   b.  record of colo done in 2002 avail; + hem's; no mention of polyps   c.  states last colo 2007(?) Large right neck lipoma   a.  CT 01/2010: Large lipoma in the right neck.  This appears to be an intramuscular lipoma of the        sternocleidomastoid muscle and appears to be a simple lipoma.  No soft tissue mass.  Past Surgical History: Last updated: 02/07/2010 Lumbar laminectomy   a.  L4-5; 2003; Dr. Channing Mutters   b.  MRI 12/2009:  L4-5 prior laminectomy; mod disc degen; L3-4 mod-severe spinal stenosis s/p crainial surgery by Dr. Phoebe Perch (congenital CSF leak - ?)   a.  no record in [place]  Physical Exam  General:  alert, well-developed, and well-nourished.   Head:  normocephalic and atraumatic.   Neck:  very large soft mass on right neck smaller soft mass c/w probable lipomas right post scalp  Lungs:  normal breath sounds and no wheezes.   Heart:  normal rate, regular rhythm, and no murmur.   Neurologic:  alert & oriented X3 and cranial nerves II-XII intact.   Psych:  normally interactive.    Diabetes Management Exam:    Foot Exam (with socks and/or shoes not present):       Sensory-Monofilament:          Left foot: normal          Right foot: normal       Inspection:          Left foot: normal          Right foot: normal       Nails:          Left foot: fungal infection          Right foot: fungal infection   Impression & Recommendations:  Problem # 1:  HYPERTENSION  (ICD-401.9) Assessment Improved add hctz  His updated medication list for this problem includes:    Lisinopril 20 Mg Tabs (Lisinopril) .[name]... Take 1 tablet by mouth once a day for blood pressure    Hydrochlorothiazide 25 Mg Tabs (Hydrochlorothiazide) .[name]... 1/2 tab by mouth once daily for blood pressure  Orders: T-Basic Metabolic Panel [phone])  Problem # 2:  HYPERLIPIDEMIA (ICD-272.4)  no fasting return fasting for lab  Problem # 3:  DIABETES MELLITUS, TYPE II (ICD-250.00) Assessment: Improved taking meds shows me his sugars last 1-2 weeks sugars consistently below 200 no hypoglycemic episodes will get retasure  His updated medication list for this problem includes:    Metformin Hcl 500 Mg Tabs (Metformin hcl) .[name]... Take 1 tablet by mouth two times a day for diabetes    Lantus 100 Unit/ml Soln (Insulin glargine) ..... Inject 10 units at bedtime for diabetes    Lisinopril 20 Mg Tabs (Lisinopril) .[name]... 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 . surgery is planned  Problem # 6:  ALCOHOL ABUSE (ICD-305.00) states he has not had anything to drink since first visit has appt with SA counselor in 2 weeks  Complete Medication List: 1)  Metformin Hcl 500 Mg Tabs (Metformin hcl) .... Take 1 tablet by mouth two times a day for diabetes 2)  Lantus 100 Unit/ml Soln (Insulin glargine) .... Inject 10 units at bedtime for diabetes 3)  Insulin Syringes  .... Inject as directed 4)  Glucose Meter  .... Check sugar three times a day before each meal 5)  Glucose Meter Strips  .... Check sugar three times a day before each meal 6)  Lancets  .... Check sugar three times a day 7)  Lisinopril 20 Mg Tabs (Lisinopril) .... Take 1 tablet by mouth once a day  for blood pressure 8)  Oxycodone Hcl 5 Mg Tabs (Oxycodone hcl) .... Take 1-2 tabs every 8 hours as needed for severe pain 9)  Hydrochlorothiazide 25 Mg Tabs (Hydrochlorothiazide) .... 1/2 tab by mouth once daily for blood pressure  Patient Instructions: 1)  Schedule Retasure. 2)  Schedule lab visit  in next 1-2 weeks fasting for Lipids (nothing to eat or drink after midnight the night before).  You can take your diabetes medicines after your blood draw.  It is ok to take your blood pressure medicine with some water. 3)  Keep taking your same medicines.  I am adding HCTZ 25 mg 1/2 tablet once daily for blood pressure. 4)  Check BP and BMET at lab visit in 1-2 weeks.  Notify Anatasia Tino if BP > 140/90. 5)  Please schedule a follow-up appointment in 2 months for blood pressure and diabetes.  Prescriptions: OXYCODONE HCL 5 MG TABS (OXYCODONE HCL) Take 1-2 tabs every 8 hours as needed for severe pain  #40 x 0   Entered and Authorized by:   Tereso Newcomer PA-C   Signed by:   Tereso Newcomer PA-C on 03/07/2010   Method used:   Print then Give to Patient   RxID:   [id number] HYDROCHLOROTHIAZIDE 25 MG TABS (HYDROCHLOROTHIAZIDE) 1/2 tab by mouth once daily for blood pressure  #90 x 1   Entered and Authorized by:   Tereso Newcomer PA-C   Signed by:   Tereso Newcomer PA-C on 03/07/2010   Method used:   Print then Give to Patient   RxID:   [id number]          Diabetic Foot Exam Last Podiatry Exam Date: 03/07/2010  Foot Inspection Is there a history of a foot ulcer?              No Is there a foot ulcer now?              No Can the patient see the bottom of their feet?          Yes Are the shoes appropriate in style and fit?          Yes Is there swelling or an abnormal foot shape?          No Are the toenails long?                No Are the toenails thick?                No Are the toenails ingrown?              No Is there heavy callous build-up?              No Is there a claw toe  deformity?                          No Is there elevated skin temperature?            No Is there limited ankle dorsiflexion?            No Is there foot or ankle muscle weakness?            No Do you have pain in calf while walking?           No         10-g (5.07) Semmes-Weinstein Monofilament Test Performed by: Armenia Shannon          Right Foot          Left Foot Visual Inspection               Test Control      normal         normal Site 1         normal  normal Site 2         normal         normal Site 3         normal         normal Site 4         normal         normal Site 5         normal         normal Site 6         normal         normal Site 7         normal         normal Site 8         normal         normal Site 9         normal         normal Site 10         normal         normal  Impression      normal         normal

## 2010-07-17 NOTE — Progress Notes (Signed)
Summary: Needs refill of pain meds  Phone Note Other Incoming   Summary of Call: Needs refills of hydrocodone.   Initial call taken by: Dutch Quint RN,  March 31, 2010 12:48 PM  Follow-up for Phone Call        Cannot refill until 10/23.  Ok to refill then, but I will not be here.  Have [facility] or Dr. Delrae Alfred fill on 10/23 Follow-up by: Tereso Newcomer PA-C,  March 31, 2010 2:01 PM  Additional Follow-up for Phone Call Additional follow up Details #1::        Sent to N. Daphine Deutscher.  Dutch Quint RN  April 01, 2010 5:06 PM     Additional Follow-up for Phone Call Additional follow up Details #2::    will fill on 04/07/2010 (Monday)  Follow-up by: Lehman Prom FNP,  April 02, 2010 8:33 AM  Additional Follow-up for Phone Call Additional follow up Details #3:: Details for Additional Follow-up Action Taken: Rx printed and in basket notify pt that he will need to come into the office to pick up n.martin,fnp April 07, 2010  6:40 PM  Pt. notified.  Dutch Quint RN  April 08, 2010 10:22 AM   Prescriptions: OXYCODONE HCL 5 MG TABS (OXYCODONE HCL) Take 1-2 tabs every 8 hours as needed for severe pain  #40 x 0   Entered and Authorized by:   Lehman Prom FNP   Signed by:   Lehman Prom FNP on 04/07/2010   Method used:   Print then Give to Patient   RxID:   [id number]

## 2010-07-17 NOTE — Letter (Signed)
Summary: External Correspondence  External Correspondence   Imported By: Arta Bruce 05/06/2010 09:30:22  _____________________________________________________________________  External Attachment:    Type:   Image     Comment:   External Document

## 2010-07-17 NOTE — Assessment & Plan Note (Signed)
Summary: Recheck BP, BMET and flu shot  Nurse Visit   Vital Signs:  Patient profile:   70 year old male Pulse rate:   88 / minute Pulse rhythm:   regular Resp:     20 per minute BP sitting:   128 / 88  (left arm) Cuff size:   large  Vitals Entered By: Dutch Quint RN (March 31, 2010 10:26 AM)  Patient Instructions: 1)  Reviewed with Wende Mott 2)  Your blood pressure is much better today 128/88. 3)  Continue medications as ordered -- no change at this time. 4)  Keep appointment with Josh Nicolosi in November. 5)  We will call with results of your lab work. 6)  You received your flu shot today -- please read handout 7)  Call if anything changes or if you have any questions.   Primary Care Kye Silverstein:  Tereso Newcomer, PA-C  CC:  BP recheck and BMET.  History of Present Illness: Took all his medications this morning.  Denies cough, headache or visual changes   Review of Systems CV:  Complains of fatigue and leg cramps with exertion; denies bluish discoloration of lips or nails, chest pain or discomfort, difficulty breathing at night, difficulty breathing while lying down, fainting, lightheadness, near fainting, palpitations, shortness of breath with exertion, swelling of feet, swelling of hands, and weight gain.   Physical Exam  Lungs:  normal respiratory effort, normal breath sounds, no crackles, and no wheezes.   Heart:  normal rate and regular rhythm.     Impression & Recommendations:  Problem # 1:  HYPERTENSION (ICD-401.9)  BP better today - 128/88 No change in meds Keep appt. with Stephie Xu in November BMET done Flu shot given  His updated medication list for this problem includes:    Lisinopril 20 Mg Tabs (Lisinopril) .[name]... Take 1 tablet by mouth once a day for blood pressure    Hydrochlorothiazide 25 Mg Tabs (Hydrochlorothiazide) .[name]... 1 tab by mouth once daily for blood pressure  Orders: T-Basic Metabolic Panel [phone])  Complete Medication List: 1)   Metformin Hcl 500 Mg Tabs (Metformin hcl) .... Take 1 tablet by mouth two times a day for diabetes 2)  Lantus 100 Unit/ml Soln (Insulin glargine) .... Inject 10 units at bedtime for diabetes 3)  Insulin Syringes  .... Inject as directed 4)  Glucose Meter  .... Check sugar three times a day before each meal 5)  Glucose Meter Strips  .... Check sugar three times a day before each meal 6)  Lancets  .... Check sugar three times a day 7)  Lisinopril 20 Mg Tabs (Lisinopril) .... Take 1 tablet by mouth once a day for blood pressure 8)  Oxycodone Hcl 5 Mg Tabs (Oxycodone hcl) .... Take 1-2 tabs every 8 hours as needed for severe pain 9)  Hydrochlorothiazide 25 Mg Tabs (Hydrochlorothiazide) .[name].. 1 tab by mouth once daily for blood pressure  Other Orders: Flu Vaccine 68yrs + (21308) Admin 1st Vaccine (65784)  CC: BP recheck and BMET Is Patient Diabetic? Yes Did you bring your meter with you today? No Pain Assessment Patient in pain? yes     Location: right leg into foot Intensity: 10 Type: shooting Onset of pain  Chronic  Does patient need assistance? Functional Status Self care Ambulation Impaired:Risk for fall Comments Uses cane for ambulation   Allergies: 1)  ! Penicillin G Potassium (Penicillin G Potassium)  Immunizations Administered:  Influenza Vaccine # 1:    Vaccine Type: Fluvax 3+  Site: right deltoid    Mfr: GlaxoSmithKline    Dose: 0.5 ml    Route: IM    Given by: Dutch Quint RN    Exp. Date: 12/13/2010    Lot #: [id number]    VIS given: 01/07/10 version given March 31, 2010.  Flu Vaccine Consent Questions:    Do you have a history of severe allergic reactions to this vaccine? no    Any prior history of allergic reactions to egg and/or gelatin? no    Do you have a sensitivity to the preservative Thimersol? no    Do you have a past history of Guillan-Barre Syndrome? no    Do you currently have an acute febrile illness? no    Have you ever had a severe reaction  to latex? no    Vaccine information given and explained to patient? yes  Orders Added: 1)  Flu Vaccine 108yrs + [90658] 2)  Admin 1st Vaccine [90471] 3)  Est. Patient Level I [09811] 4)  T-Basic Metabolic Panel [[id number]]

## 2010-07-17 NOTE — Miscellaneous (Signed)
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

## 2010-07-17 NOTE — Letter (Signed)
Summary: [facility]   Imported By: Arta Bruce 06/27/2010 14:30:41  _____________________________________________________________________  External Attachment:    Type:   Image     Comment:   External Document

## 2010-07-23 NOTE — Letter (Signed)
Summary: [facility]  [facility]   Imported By: Arta Bruce 07/17/2010 16:45:59  _____________________________________________________________________  External Attachment:    Type:   Image     Comment:   External Document

## 2010-08-08 ENCOUNTER — Encounter: Payer: Self-pay | Admitting: Nurse Practitioner

## 2010-08-08 ENCOUNTER — Encounter (INDEPENDENT_AMBULATORY_CARE_PROVIDER_SITE_OTHER): Payer: Self-pay | Admitting: Nurse Practitioner

## 2010-08-08 DIAGNOSIS — G589 Mononeuropathy, unspecified: Secondary | ICD-10-CM | POA: Insufficient documentation

## 2010-08-08 LAB — CONVERTED CEMR LAB
Bilirubin Urine: NEGATIVE
Blood Glucose, Fingerstick: 154
Glucose, Urine, Semiquant: 100
Hgb A1c MFr Bld: 6.2 %
Ketones, urine, test strip: NEGATIVE
Nitrite: NEGATIVE
Specific Gravity, Urine: 1.01
Urobilinogen, UA: 0.2
pH: 7

## 2010-08-12 NOTE — Letter (Signed)
Summary: Handout Printed  Printed Handout:  - Neuropathy 

## 2010-08-12 NOTE — Assessment & Plan Note (Signed)
Summary: Neuropathy   Vital Signs:  Patient profile:   70 year old male Weight:      181.9 pounds BMI:     26.76 Temp:     97.7 degrees F oral Pulse rate:   96 / minute Pulse rhythm:   regular Resp:     20 per minute BP sitting:   142 / 90  (left arm) Cuff size:   large  Vitals Entered By: Levon Hedger (August 08, 2010 12:17 PM)  Nutrition Counseling: Patient's BMI is greater than 25 and therefore counseled on weight management options. CC: follow-up visit DM.[name].feet numbness, Back Pain, Hypertension Management Is Patient Diabetic? Yes Pain Assessment Patient in pain? yes     Location: right side, foot, knee Intensity: 6 CBG Result 154 CBG Device ID B  Does patient need assistance? Functional Status Self care Ambulation Normal   CC:  follow-up visit DM.[name].feet numbness, Back Pain, and Hypertension Management.  History of Present Illness:  Pt into the office for routine f/u on diabetes However he c/o 3 weeks of leg pain. Right > Left   Previous history of neck x 2 and low back surgery. Last back procedure was done 05/2010 with Dr. Channing Mutters. He did f/u for suture removal and review of wound.  Back Pain History:      The patient's back pain has been present for > 6 weeks.  He states this is not work related.  He states that he has had a prior history of back pain.    Critical Exclusionary Diagnosis Criteria (CEDC) for Back Pain:      The patient denies a history of previous trauma.  He notes a prior history of spinal surgery.  There are no symptoms to suggest infection, cancer, cauda equina, or psychosocial factors for back pain.    Diabetes Management History:      The patient is a 70 years old male who comes in for evaluation of DM Type 2.  He has not been enrolled in the "Diabetic Education Program".  He states understanding of dietary principles and is following his diet appropriately.  No sensory loss is reported.  Self foot exams are not being performed.  He is  checking home blood sugars.  He says that he is not exercising regularly.        Hypoglycemic symptoms are not occurring.  No hyperglycemic symptoms are reported.  Other comments include: pt has been out of his meds for the past 3 days.    Hypertension History:      He denies headache, chest pain, and palpitations.  He notes no problems with any antihypertensive medication side effects.        Positive major cardiovascular risk factors include male age 32 years old or older, diabetes, hyperlipidemia, and hypertension.  Negative major cardiovascular risk factors include non-tobacco-user status.        Further assessment for target organ damage reveals no history of peripheral vascular disease.      Habits & Providers  Exercise-Depression-Behavior     Does Patient Exercise: no  Medications Prior to Update: 1)  Metformin Hcl 500 Mg Tabs (Metformin Hcl) .... Take 1 Tablet By Mouth Two Times A Day For Diabetes 2)  Lantus 100 Unit/ml Soln (Insulin Glargine) .... Inject 10 Units At Bedtime For Diabetes 3)  Insulin Syringes .... Inject As Directed 4)  Glucose Meter .... Check Sugar Three Times A Day Before Each Meal 5)  Glucose Meter Strips .... Check Sugar Three Times A  Day Before Each Meal 6)  Lancets .... Check Sugar Three Times A Day 7)  Lisinopril 20 Mg Tabs (Lisinopril) .... Take 1 Tablet By Mouth Once A Day For Blood Pressure 8)  Oxycodone Hcl 5 Mg Tabs (Oxycodone Hcl) .... Take 1-2 Tabs Every 8 Hours As Needed For Severe Pain 9)  Hydrochlorothiazide 25 Mg Tabs (Hydrochlorothiazide) .[name].. 1 Tab By Mouth Once Daily For Blood Pressure 10)  Klor-Con M20 20 Meq Cr-Tabs (Potassium Chloride Crys Cr) .... Take 1 Tablet By Mouth Once A Day  Current Medications (verified): 1)  Metformin Hcl 500 Mg Tabs (Metformin Hcl) .... Take 1 Tablet By Mouth Two Times A Day For Diabetes 2)  Lantus 100 Unit/ml Soln (Insulin Glargine) .... Inject 10 Units At Bedtime For Diabetes 3)  Insulin Syringes .... Inject  As Directed 4)  Glucose Meter .... Check Sugar Three Times A Day Before Each Meal 5)  Glucose Meter Strips .... Check Sugar Three Times A Day Before Each Meal 6)  Lancets .... Check Sugar Three Times A Day 7)  Lisinopril 20 Mg Tabs (Lisinopril) .... Take 1 Tablet By Mouth Once A Day For Blood Pressure 8)  Oxycodone Hcl 5 Mg Tabs (Oxycodone Hcl) .... Take 1-2 Tabs Every 8 Hours As Needed For Severe Pain 9)  Hydrochlorothiazide 25 Mg Tabs (Hydrochlorothiazide) .[name].. 1 Tab By Mouth Once Daily For Blood Pressure 10)  Klor-Con M20 20 Meq Cr-Tabs (Potassium Chloride Crys Cr) .... Take 1 Tablet By Mouth Once A Day  Allergies (verified): 1)  ! Penicillin G Potassium (Penicillin G Potassium)  Social History: Does Patient Exercise:  no  Review of Systems General:  Denies fever. CV:  Denies chest pain or discomfort. Resp:  Denies cough. GI:  Denies abdominal pain, nausea, and vomiting. Neuro:  Complains of numbness and tingling; R>L.  Physical Exam  General:  alert.   Head:  normocephalic.   Ears:  bil TM with bony landmarks present Lungs:  normal breath sounds.   Heart:  normal rate and regular rhythm.   Neurologic:  cane use  Diabetes Management Exam:    Foot Exam (with socks and/or shoes not present):       Sensory-Monofilament:          Left foot: normal          Right foot: normal   Impression & Recommendations:  Problem # 1:  DIABETES MELLITUS, TYPE II (ICD-250.00) Hgba1c = 6.2 pt would like to use lantus solostar as he has some problems with vision and it is difficult to draw up lantus using the syringe The following medications were removed from the medication list:    Lantus 100 Unit/ml Soln (Insulin glargine) ..... Inject 10 units at bedtime for diabetes His updated medication list for this problem includes:    Metformin Hcl 500 Mg Tabs (Metformin hcl) .[name]... Take 1 tablet by mouth two times a day for diabetes    Lisinopril 20 Mg Tabs (Lisinopril) .[name]... Take 1 tablet by  mouth once a day for blood pressure    Lantus Solostar 100 Unit/ml Soln (Insulin glargine) .Marland KitchenMarland KitchenMarland KitchenMarland Kitchen 10 units subcutaneously nightly for diabetes  Orders: Capillary Blood Glucose/CBG (16109)  Problem # 2:  HYPERTENSION (ICD-401.9)  His updated medication list for this problem includes:    Lisinopril 20 Mg Tabs (Lisinopril) .[name]... Take 1 tablet by mouth once a day for blood pressure    Hydrochlorothiazide 25 Mg Tabs (Hydrochlorothiazide) .[name]... 1 tab by mouth once daily for blood pressure  Problem #  3:  HYPERLIPIDEMIA (ICD-272.4)  Problem # 4:  NEUROPATHY (ICD-355.9) handout given will start neurontin  Complete Medication List: 1)  Metformin Hcl 500 Mg Tabs (Metformin hcl) .... Take 1 tablet by mouth two times a day for diabetes 2)  Insulin Syringes  .... Inject as directed 3)  Glucose Meter  .... Check sugar three times a day before each meal 4)  Glucose Meter Strips  .... Check sugar three times a day before each meal 5)  Lancets  .... Check sugar three times a day 6)  Lisinopril 20 Mg Tabs (Lisinopril) .... Take 1 tablet by mouth once a day for blood pressure 7)  Oxycodone Hcl 5 Mg Tabs (Oxycodone hcl) .... Take 1-2 tabs every 8 hours as needed for severe pain 8)  Hydrochlorothiazide 25 Mg Tabs (Hydrochlorothiazide) .[name].. 1 tab by mouth once daily for blood pressure 9)  Klor-con M20 20 Meq Cr-tabs (Potassium chloride crys cr) .... Take 1 tablet by mouth once a day 10)  Lantus Solostar 100 Unit/ml Soln (Insulin glargine) .[name].. 10 units subcutaneously nightly for diabetes 11)  Gabapentin 300 Mg Caps (Gabapentin) .... One capsule by mouth nightly for 3 nights then increase to 2 capsules by mouth nightly  Other Orders: UA Dipstick w/o Micro (manual) (16109)  Diabetes Management Assessment/Plan:      The following lipid goals have been established for the patient: Total cholesterol goal of 200; LDL cholesterol goal of 100; HDL cholesterol goal of 40; Triglyceride goal of 150.  His blood  pressure goal is < 130/80.    Hypertension Assessment/Plan:      The patient's hypertensive risk group is category C: Target organ damage and/or diabetes.  His calculated 10 year risk of coronary heart disease is 18 %.  Today's blood pressure is 142/90.  His blood pressure goal is < 130/80.  Patient Instructions: 1)  Continue to take the meloxicam for inflammation.  Take this daily with food. 2)  Gapapentin - start with 300mg  by mouth nightly for 3 nights then increase to 2 capsules by mouth nightly.  This will make you sleepy. 3)  Follow up in 4 weeks for back pain and diabetes. Prescriptions: OXYCODONE HCL 5 MG TABS (OXYCODONE HCL) Take 1-2 tabs every 8 hours as needed for severe pain  #40 x 0   Entered and Authorized by:   Lehman Prom FNP   Signed by:   Lehman Prom FNP on 08/08/2010   Method used:   Print then Give to Patient   RxID:   [id number] GABAPENTIN 300 MG CAPS (GABAPENTIN) One capsule by mouth nightly for 3 nights then increase to 2 capsules by mouth nightly  #60 x 1   Entered and Authorized by:   Lehman Prom FNP   Signed by:   Lehman Prom FNP on 08/08/2010   Method used:   Print then Give to Patient   RxID:   [id number] LANTUS SOLOSTAR 100 UNIT/ML SOLN (INSULIN GLARGINE) 10 units subcutaneously nightly for diabetes  #1 month qs x 11   Entered and Authorized by:   Lehman Prom FNP   Signed by:   Lehman Prom FNP on 08/08/2010   Method used:   Electronically to        [facility] (retail)       [address]       [place], [place]  [id number]       Ph: [phone]       Fax: [phone]   RxID:  [id number] KLOR-CON M20 20 MEQ CR-TABS (POTASSIUM CHLORIDE CRYS CR) Take 1 tablet by mouth once a day  #30 x 11   Entered and Authorized by:   Lehman Prom FNP   Signed by:   Lehman Prom FNP on 08/08/2010   Method used:   Electronically to        [facility] (retail)       [address]       [place], [place]  [address]       Ph: [phone]       Fax: [phone]   RxID:   [id number] HYDROCHLOROTHIAZIDE 25 MG TABS (HYDROCHLOROTHIAZIDE) 1 tab by mouth once daily for blood pressure  #30 x 11   Entered and Authorized by:   Lehman Prom FNP   Signed by:   Lehman Prom FNP on 08/08/2010   Method used:   Electronically to        [facility] (retail)       [address]       [place], [place]  [address]       Ph: [phone]       Fax: [phone]   RxID:   [id number] LISINOPRIL 20 MG TABS (LISINOPRIL) Take 1 tablet by mouth once a day for blood pressure  #30 x 11   Entered and Authorized by:   Lehman Prom FNP   Signed by:   Lehman Prom FNP on 08/08/2010   Method used:   Electronically to        [facility] (retail)       [address]       [place], [place]  [address]       Ph: [phone]       Fax: [phone]   RxID:   [id number]    Orders Added: 1)  Capillary Blood Glucose/CBG [82948] 2)  Est. Patient Level IV [99371] 3)  UA Dipstick w/o Micro (manual) [81002]    Laboratory Results   Urine Tests  Date/Time Received: August 08, 2010 12:50 PM   Routine Urinalysis   Color: yellow Appearance: Hazy Glucose: 100   (Normal Range: Negative) Bilirubin: negative   (Normal Range: Negative) Ketone: negative   (Normal Range: Negative) Spec. Gravity: 1.010   (Normal Range: 1.003-1.035) Blood: trace-intact   (Normal Range: Negative) pH: 7.0   (Normal Range: 5.0-8.0) Protein: trace   (Normal Range: Negative) Urobilinogen: 0.2   (Normal Range: 0-1) Nitrite: negative   (Normal Range: Negative) Leukocyte Esterace: large   (Normal Range: Negative)     Blood Tests   Date/Time Received: August 08, 2010 12:47 PM   HGBA1C: 6.2%   (Normal Range: Non-Diabetic - 3-6%   Control Diabetic - 6-8%) CBG Random:: 154       Last LDL:                                                  88 (05/12/2010 10:45:00 PM)        Diabetic Foot Exam Last Podiatry Exam Date: 03/07/2010    10-g (5.07) Semmes-Weinstein Monofilament Test Performed by: Levon Hedger          Right Foot          Left Foot Visual Inspection               Test  Control      normal         normal Site 1         normal         normal Site 2         normal         normal Site 3         normal         normal Site 4         normal         normal Site 5         normal         normal Site 6         normal         normal Site 7         normal         normal Site 8         normal         normal Site 9         normal         normal Site 10         normal         normal  Impression      normal         normal  Laboratory Results   Urine Tests    Routine Urinalysis   Color: yellow Appearance: Hazy Glucose: 100   (Normal Range: Negative) Bilirubin: negative   (Normal Range: Negative) Ketone: negative   (Normal Range: Negative) Spec. Gravity: 1.010   (Normal Range: 1.003-1.035) Blood: trace-intact   (Normal Range: Negative) pH: 7.0   (Normal Range: 5.0-8.0) Protein: trace   (Normal Range: Negative) Urobilinogen: 0.2   (Normal Range: 0-1) Nitrite: negative   (Normal Range: Negative) Leukocyte Esterace: large   (Normal Range: Negative)     Blood Tests     HGBA1C: 6.2%   (Normal Range: Non-Diabetic - 3-6%   Control Diabetic - 6-8%) CBG Random:: 154mg /dL

## 2010-08-25 LAB — GLUCOSE, CAPILLARY
Glucose-Capillary: 114 mg/dL — ABNORMAL HIGH (ref 70–99)
Glucose-Capillary: 122 mg/dL — ABNORMAL HIGH (ref 70–99)
Glucose-Capillary: 123 mg/dL — ABNORMAL HIGH (ref 70–99)
Glucose-Capillary: 174 mg/dL — ABNORMAL HIGH (ref 70–99)
Glucose-Capillary: 201 mg/dL — ABNORMAL HIGH (ref 70–99)
Glucose-Capillary: 214 mg/dL — ABNORMAL HIGH (ref 70–99)

## 2010-08-26 LAB — GLUCOSE, CAPILLARY
Glucose-Capillary: 127 mg/dL — ABNORMAL HIGH (ref 70–99)
Glucose-Capillary: 156 mg/dL — ABNORMAL HIGH (ref 70–99)
Glucose-Capillary: 159 mg/dL — ABNORMAL HIGH (ref 70–99)
Glucose-Capillary: 265 mg/dL — ABNORMAL HIGH (ref 70–99)
Glucose-Capillary: 146 mg/dL — ABNORMAL HIGH (ref 70–99)

## 2010-08-26 LAB — BASIC METABOLIC PANEL
BUN: 12 mg/dL (ref 6–23)
CO2: 32 mEq/L (ref 19–32)
Calcium: 9.8 mg/dL (ref 8.4–10.5)
Chloride: 95 mEq/L — ABNORMAL LOW (ref 96–112)
Creatinine, Ser: 0.88 mg/dL (ref 0.4–1.5)
GFR calc Af Amer: >60 mL/min (ref >60)
GFR calc non Af Amer: >60 mL/min (ref >60)
Glucose, Bld: 138 mg/dL — ABNORMAL HIGH (ref 70–99)
Potassium: 3.8 mEq/L (ref 3.5–5.1)
Sodium: 134 mEq/L — ABNORMAL LOW (ref 135–145)

## 2010-08-26 LAB — URINALYSIS, ROUTINE W REFLEX MICROSCOPIC
Bilirubin Urine: NEGATIVE
Glucose, UA: NEGATIVE mg/dL
Hgb urine dipstick: NEGATIVE
Specific Gravity, Urine: 1.012 (ref 1.005–1.030)
pH: 7 (ref 5.0–8.0)

## 2010-08-26 LAB — CBC
HCT: 41.3 % (ref 39.0–52.0)
Hemoglobin: 14.2 g/dL (ref 13.0–17.0)
MCH: 29.6 pg (ref 26.0–34.0)
MCHC: 34.4 g/dL (ref 30.0–36.0)
MCV: 86.2 fL (ref 78.0–100.0)
Platelets: 246 10*3/uL (ref 150–400)
RBC: 4.79 MIL/uL (ref 4.22–5.81)
RDW: 12.7 % (ref 11.5–15.5)
WBC: 7.4 10*3/uL (ref 4.0–10.5)

## 2010-08-26 LAB — DIFFERENTIAL
Basophils Relative: 1 % (ref 0–1)
Eosinophils Absolute: 0.2 10*3/uL (ref 0.0–0.7)
Monocytes Absolute: 0.6 10*3/uL (ref 0.1–1.0)
Monocytes Relative: 9 % (ref 3–12)
Neutrophils Relative %: 62 % (ref 43–77)

## 2010-08-26 LAB — SURGICAL PCR SCREEN: MRSA, PCR: NEGATIVE

## 2010-08-27 LAB — CBC
HCT: 42.2 % (ref 39.0–52.0)
Hemoglobin: 14.3 g/dL (ref 13.0–17.0)
MCV: 89.2 fL (ref 78.0–100.0)
RDW: 12.3 % (ref 11.5–15.5)
WBC: 6 10*3/uL (ref 4.0–10.5)

## 2010-08-27 LAB — DIFFERENTIAL
Basophils Absolute: 0 10*3/uL (ref 0.0–0.1)
Eosinophils Relative: 1 % (ref 0–5)
Lymphocytes Relative: 28 % (ref 12–46)
Monocytes Absolute: 0.5 10*3/uL (ref 0.1–1.0)
Monocytes Relative: 8 % (ref 3–12)

## 2010-08-27 LAB — BASIC METABOLIC PANEL
BUN: 7 mg/dL (ref 6–23)
Chloride: 97 mEq/L (ref 96–112)
Glucose, Bld: 171 mg/dL — ABNORMAL HIGH (ref 70–99)
Potassium: 4.1 mEq/L (ref 3.5–5.1)
Sodium: 136 mEq/L (ref 135–145)

## 2010-08-27 LAB — SURGICAL PCR SCREEN: MRSA, PCR: NEGATIVE

## 2010-08-27 LAB — TYPE AND SCREEN
ABO/RH(D): O POS
Antibody Screen: NEGATIVE

## 2010-08-27 LAB — ABO/RH: ABO/RH(D): O POS

## 2010-08-27 LAB — APTT: aPTT: 28 seconds (ref 24–37)

## 2010-08-27 LAB — GLUCOSE, CAPILLARY: Glucose-Capillary: 147 mg/dL — ABNORMAL HIGH (ref 70–99)

## 2010-08-30 LAB — URINALYSIS, ROUTINE W REFLEX MICROSCOPIC
Bilirubin Urine: NEGATIVE
Glucose, UA: >1000 mg/dL — AB
Hgb urine dipstick: NEGATIVE
Specific Gravity, Urine: 1.026 (ref 1.005–1.030)
Urobilinogen, UA: 0.2 mg/dL (ref 0.0–1.0)
pH: 7 (ref 5.0–8.0)

## 2010-08-30 LAB — CBC
Hemoglobin: 15.2 g/dL (ref 13.0–17.0)
MCHC: 34 g/dL (ref 30.0–36.0)
RDW: 11.8 % (ref 11.5–15.5)
WBC: 5.8 10*3/uL (ref 4.0–10.5)

## 2010-08-30 LAB — URINE MICROSCOPIC-ADD ON

## 2010-08-30 LAB — BASIC METABOLIC PANEL
Calcium: 9.4 mg/dL (ref 8.4–10.5)
GFR calc non Af Amer: >60 mL/min (ref >60)
Glucose, Bld: 311 mg/dL — ABNORMAL HIGH (ref 70–99)
Potassium: 3 mEq/L — ABNORMAL LOW (ref 3.5–5.1)
Sodium: 133 mEq/L — ABNORMAL LOW (ref 135–145)

## 2010-08-30 LAB — DIFFERENTIAL
Basophils Absolute: 0 10*3/uL (ref 0.0–0.1)
Basophils Relative: 0 % (ref 0–1)
Lymphocytes Relative: 24 % (ref 12–46)
Monocytes Relative: 11 % (ref 3–12)
Neutro Abs: 3.7 10*3/uL (ref 1.7–7.7)
Neutrophils Relative %: 64 % (ref 43–77)

## 2010-10-23 ENCOUNTER — Other Ambulatory Visit: Payer: Self-pay | Admitting: Neurosurgery

## 2010-10-23 DIAGNOSIS — M47816 Spondylosis without myelopathy or radiculopathy, lumbar region: Secondary | ICD-10-CM

## 2010-10-30 ENCOUNTER — Ambulatory Visit
Admission: RE | Admit: 2010-10-30 | Discharge: 2010-10-30 | Disposition: A | Discharge status: Home / Self Care | Payer: Medicare Other | Source: Ambulatory Visit | Attending: Neurosurgery | Admitting: Neurosurgery

## 2010-10-30 DIAGNOSIS — M47816 Spondylosis without myelopathy or radiculopathy, lumbar region: Secondary | ICD-10-CM

## 2010-10-31 NOTE — Procedures (Signed)
[place]  Patient:    Donald Calderon, Donald Calderon Visit Number: [id number] MRN: [id number]          Service Type: END Location: ENDO Attending Physician:  Charna Elizabeth Dictated by:   Anselmo Rod, M.D. Proc. Date: 02/25/01 Admit Date:  02/25/2001   CC:         Earlene Plater L. Cloward, M.D.   Procedure Report  DATE OF BIRTH:  03-May-1941  PROCEDURE PERFORMED:  Colonoscopy.  ENDOSCOPSIT:  Anselmo Rod, M.D.  INSTRUMENT USED:  Olympus video colonoscope.  INDICATIONS FOR PROCEDURE:  Blood in stools and recent history of rectal bleediNG in a 70 year old African-American male.  Rule out polyps, ______, hemorrhoids, etc.  PREPROCEDURE PREPARATION:  Informed consent was procured from the patient. The patient was fasted for eight hours prior to the procedure and prepped with a bottle of magnesium citrate and a gallon of NuLytely the night prior to the procedure.  PREPROCEDURE PHYSICAL:  VITAL SIGNS:  Stable.  NECK:  Supple.  CHEST:  Clear to auscultation.  CARDIAC:  S1 and S2 regular.  ABDOMEN:  Soft with normal bowel sounds.  DESCRIPTION OF PROCEDURE:  The patient was placed in the left lateral decubitus position and sedated with 60 mg of Demerol and 7 mg of Versed intravenously.  Once the patient was adequately sedated and maintained on low-flow oxygen and continuous cardiac monitoring, the Olympus video colonoscope was advanced from the rectum to the cecum with difficulty secondary to large ______ throughout the colon.  There was questionable extrinsic compression of the rectum.  No definite masses were seen in the rectum.  The entire colonic mucosa was covered with pasty stool.  Multiple washes were done.  Small lesions could have been missed.  The procedure was completed up the cecum.  The ileocecal valve and appendiceal orifice were clearly visualized and photographed.  Small internal hemorrhoids were seen on retroflexion in the  rectum.  IMPRESSION: 1. Small, nonbleeding internal hemorrhoids. 2. Question extrinsic compression of the rectum. 3. Question prostate hypertrophy.  RECOMMENDATIONS: 1. Outpatient follow up in the next four weeks for repeat guaiac testing. 2. The patient was advised to consume a high fiber diet with increased fluid    intake. Dictated by:   Anselmo Rod, M.D. Attending Physician:  Charna Elizabeth DD:  02/25/01 TD:  02/26/01 Job: [id number]

## 2010-12-04 ENCOUNTER — Other Ambulatory Visit: Payer: Self-pay | Admitting: Neurosurgery

## 2010-12-04 DIAGNOSIS — M4800 Spinal stenosis, site unspecified: Secondary | ICD-10-CM

## 2010-12-09 ENCOUNTER — Ambulatory Visit
Admission: RE | Admit: 2010-12-09 | Discharge: 2010-12-09 | Disposition: A | Discharge status: Home / Self Care | Payer: Medicare Other | Source: Ambulatory Visit | Attending: Neurosurgery | Admitting: Neurosurgery

## 2010-12-09 DIAGNOSIS — M4800 Spinal stenosis, site unspecified: Secondary | ICD-10-CM

## 2011-09-27 ENCOUNTER — Encounter (HOSPITAL_COMMUNITY): Payer: Self-pay

## 2011-09-27 ENCOUNTER — Observation Stay (HOSPITAL_COMMUNITY)
Admission: EM | Admit: 2011-09-27 | Discharge: 2011-09-29 | DRG: 552 | Disposition: A | Discharge status: Home / Self Care | Payer: Medicare Other | Attending: Internal Medicine | Admitting: Internal Medicine

## 2011-09-27 ENCOUNTER — Emergency Department (HOSPITAL_COMMUNITY): Payer: Medicare Other

## 2011-09-27 DIAGNOSIS — G609 Hereditary and idiopathic neuropathy, unspecified: Secondary | ICD-10-CM | POA: Diagnosis present

## 2011-09-27 DIAGNOSIS — E785 Hyperlipidemia, unspecified: Secondary | ICD-10-CM | POA: Diagnosis present

## 2011-09-27 DIAGNOSIS — M549 Dorsalgia, unspecified: Secondary | ICD-10-CM

## 2011-09-27 DIAGNOSIS — E1149 Type 2 diabetes mellitus with other diabetic neurological complication: Secondary | ICD-10-CM | POA: Diagnosis present

## 2011-09-27 DIAGNOSIS — I10 Essential (primary) hypertension: Secondary | ICD-10-CM | POA: Diagnosis present

## 2011-09-27 DIAGNOSIS — E119 Type 2 diabetes mellitus without complications: Secondary | ICD-10-CM | POA: Diagnosis present

## 2011-09-27 DIAGNOSIS — E1142 Type 2 diabetes mellitus with diabetic polyneuropathy: Secondary | ICD-10-CM | POA: Diagnosis present

## 2011-09-27 DIAGNOSIS — G8929 Other chronic pain: Secondary | ICD-10-CM | POA: Diagnosis present

## 2011-09-27 DIAGNOSIS — M48061 Spinal stenosis, lumbar region without neurogenic claudication: Secondary | ICD-10-CM | POA: Diagnosis present

## 2011-09-27 DIAGNOSIS — M5126 Other intervertebral disc displacement, lumbar region: Principal | ICD-10-CM | POA: Diagnosis present

## 2011-09-27 HISTORY — DX: Depression, unspecified: F32.A

## 2011-09-27 HISTORY — DX: Pneumonia, unspecified organism: J18.9

## 2011-09-27 HISTORY — DX: Essential (primary) hypertension: I10

## 2011-09-27 HISTORY — DX: Major depressive disorder, single episode, unspecified: F32.9

## 2011-09-27 HISTORY — DX: Gastro-esophageal reflux disease without esophagitis: K21.9

## 2011-09-27 HISTORY — DX: Unspecified osteoarthritis, unspecified site: M19.90

## 2011-09-27 LAB — COMPREHENSIVE METABOLIC PANEL
ALT: 13 U/L (ref 0–53)
Alkaline Phosphatase: 72 U/L (ref 39–117)
CO2: 26 mEq/L (ref 19–32)
Chloride: 93 mEq/L — ABNORMAL LOW (ref 96–112)
GFR calc Af Amer: >90 mL/min (ref >90)
GFR calc non Af Amer: >90 mL/min (ref >90)
Glucose, Bld: 312 mg/dL — ABNORMAL HIGH (ref 70–99)
Potassium: 3.8 mEq/L (ref 3.5–5.1)
Sodium: 132 mEq/L — ABNORMAL LOW (ref 135–145)
Total Bilirubin: 0.6 mg/dL (ref 0.3–1.2)

## 2011-09-27 LAB — DIFFERENTIAL
Lymphocytes Relative: 6 % — ABNORMAL LOW (ref 12–46)
Lymphs Abs: 0.5 10*3/uL — ABNORMAL LOW (ref 0.7–4.0)
Neutrophils Relative %: 93 % — ABNORMAL HIGH (ref 43–77)

## 2011-09-27 LAB — CBC
Hemoglobin: 14.8 g/dL (ref 13.0–17.0)
Platelets: 244 10*3/uL (ref 150–400)
RBC: 4.9 MIL/uL (ref 4.22–5.81)
WBC: 8.6 10*3/uL (ref 4.0–10.5)

## 2011-09-27 LAB — GLUCOSE, CAPILLARY
Glucose-Capillary: 299 mg/dL — ABNORMAL HIGH (ref 70–99)
Glucose-Capillary: 309 mg/dL — ABNORMAL HIGH (ref 70–99)
Glucose-Capillary: 335 mg/dL — ABNORMAL HIGH (ref 70–99)

## 2011-09-27 LAB — URINALYSIS, ROUTINE W REFLEX MICROSCOPIC
Bilirubin Urine: NEGATIVE
Ketones, ur: 40 mg/dL — AB
Nitrite: NEGATIVE
pH: 7.5 (ref 5.0–8.0)

## 2011-09-27 LAB — HEMOGLOBIN A1C: Mean Plasma Glucose: 252 mg/dL — ABNORMAL HIGH (ref <117)

## 2011-09-27 MED ORDER — AMLODIPINE BESYLATE 5 MG PO TABS
5.0000 mg | ORAL_TABLET | Freq: Every day | ORAL | Status: DC
  Administered 2011-09-27 – 2011-09-29 (×3): 5 mg via ORAL
  Filled 2011-09-27 (×4): qty 1

## 2011-09-27 MED ORDER — ALUM & MAG HYDROXIDE-SIMETH 200-200-20 MG/5ML PO SUSP
30.0000 mL | Freq: Once | ORAL | Status: AC
  Administered 2011-09-27: 30 mL via ORAL
  Filled 2011-09-27: qty 30

## 2011-09-27 MED ORDER — SODIUM CHLORIDE 0.9 % IV SOLN
INTRAVENOUS | Status: DC
  Administered 2011-09-27 – 2011-09-28 (×3): via INTRAVENOUS

## 2011-09-27 MED ORDER — KETOROLAC TROMETHAMINE 30 MG/ML IJ SOLN
30.0000 mg | Freq: Once | INTRAMUSCULAR | Status: AC
  Administered 2011-09-27: 30 mg via INTRAVENOUS
  Filled 2011-09-27: qty 1

## 2011-09-27 MED ORDER — HYDROMORPHONE HCL PF 1 MG/ML IJ SOLN
1.0000 mg | INTRAMUSCULAR | Status: DC | PRN
  Administered 2011-09-28: 1 mg via INTRAVENOUS
  Filled 2011-09-27: qty 1

## 2011-09-27 MED ORDER — LABETALOL HCL 100 MG PO TABS
100.0000 mg | ORAL_TABLET | ORAL | Status: AC
  Filled 2011-09-27: qty 1

## 2011-09-27 MED ORDER — OXYCODONE HCL 5 MG PO TABS
5.0000 mg | ORAL_TABLET | ORAL | Status: DC | PRN
  Administered 2011-09-27 – 2011-09-29 (×5): 5 mg via ORAL
  Filled 2011-09-27 (×5): qty 1

## 2011-09-27 MED ORDER — ONDANSETRON HCL 4 MG/2ML IJ SOLN: 4.0000 mg | Freq: Four times a day (QID) | INTRAMUSCULAR | Status: DC | PRN

## 2011-09-27 MED ORDER — SODIUM CHLORIDE 0.9 % IV SOLN: INTRAVENOUS | Status: DC

## 2011-09-27 MED ORDER — INSULIN ASPART 100 UNIT/ML ~~LOC~~ SOLN
0.0000 [IU] | Freq: Three times a day (TID) | SUBCUTANEOUS | Status: DC
  Administered 2011-09-27: 11 [IU] via SUBCUTANEOUS
  Administered 2011-09-28: 7 [IU] via SUBCUTANEOUS
  Administered 2011-09-28: 11 [IU] via SUBCUTANEOUS
  Administered 2011-09-29: 7 [IU] via SUBCUTANEOUS
  Filled 2011-09-27: qty 1

## 2011-09-27 MED ORDER — ONDANSETRON HCL 4 MG/2ML IJ SOLN: 4.0000 mg | Freq: Three times a day (TID) | INTRAMUSCULAR | Status: DC | PRN

## 2011-09-27 MED ORDER — ENOXAPARIN SODIUM 40 MG/0.4ML ~~LOC~~ SOLN
40.0000 mg | SUBCUTANEOUS | Status: DC
  Administered 2011-09-27 – 2011-09-28 (×2): 40 mg via SUBCUTANEOUS
  Filled 2011-09-27 (×3): qty 0.4

## 2011-09-27 MED ORDER — ALBUTEROL SULFATE (5 MG/ML) 0.5% IN NEBU: 2.5000 mg | INHALATION_SOLUTION | RESPIRATORY_TRACT | Status: DC | PRN

## 2011-09-27 MED ORDER — MORPHINE SULFATE 4 MG/ML IJ SOLN
8.0000 mg | Freq: Once | INTRAMUSCULAR | Status: AC
  Administered 2011-09-27: 8 mg via INTRAVENOUS
  Filled 2011-09-27: qty 2

## 2011-09-27 MED ORDER — HYDROMORPHONE HCL PF 1 MG/ML IJ SOLN
1.0000 mg | Freq: Once | INTRAMUSCULAR | Status: AC
  Administered 2011-09-27: 1 mg via INTRAVENOUS
  Filled 2011-09-27: qty 1

## 2011-09-27 MED ORDER — HYDRALAZINE HCL 20 MG/ML IJ SOLN
10.0000 mg | Freq: Four times a day (QID) | INTRAMUSCULAR | Status: DC | PRN
  Administered 2011-09-27 – 2011-09-29 (×3): 10 mg via INTRAVENOUS
  Filled 2011-09-27 (×3): qty 1

## 2011-09-27 MED ORDER — METHOCARBAMOL 100 MG/ML IJ SOLN
500.0000 mg | Freq: Three times a day (TID) | INTRAVENOUS | Status: DC | PRN
  Administered 2011-09-29: 500 mg via INTRAVENOUS
  Filled 2011-09-27 (×2): qty 5

## 2011-09-27 MED ORDER — HYDROMORPHONE HCL PF 1 MG/ML IJ SOLN: 1.0000 mg | INTRAMUSCULAR | Status: DC | PRN

## 2011-09-27 MED ORDER — MORPHINE SULFATE 4 MG/ML IJ SOLN
4.0000 mg | Freq: Once | INTRAMUSCULAR | Status: AC
  Administered 2011-09-27: 4 mg via INTRAVENOUS
  Filled 2011-09-27: qty 1

## 2011-09-27 MED ORDER — METHYLPREDNISOLONE SODIUM SUCC 40 MG IJ SOLR
40.0000 mg | Freq: Two times a day (BID) | INTRAMUSCULAR | Status: DC
  Administered 2011-09-27 – 2011-09-29 (×4): 40 mg via INTRAVENOUS
  Filled 2011-09-27 (×6): qty 1

## 2011-09-27 MED ORDER — ENALAPRILAT 1.25 MG/ML IV SOLN
1.2500 mg | Freq: Four times a day (QID) | INTRAVENOUS | Status: DC | PRN
  Administered 2011-09-27: 1.25 mg via INTRAVENOUS
  Filled 2011-09-27: qty 1

## 2011-09-27 MED ORDER — METHYLPREDNISOLONE SODIUM SUCC 125 MG IJ SOLR
125.0000 mg | Freq: Once | INTRAMUSCULAR | Status: AC
  Administered 2011-09-27: 125 mg via INTRAVENOUS
  Filled 2011-09-27: qty 2

## 2011-09-27 MED ORDER — LISINOPRIL 20 MG PO TABS
20.0000 mg | ORAL_TABLET | Freq: Every day | ORAL | Status: DC
  Administered 2011-09-27 – 2011-09-29 (×3): 20 mg via ORAL
  Filled 2011-09-27 (×4): qty 1

## 2011-09-27 MED ORDER — CLONIDINE HCL 0.3 MG/24HR TD PTWK
0.3000 mg | MEDICATED_PATCH | TRANSDERMAL | Status: DC
  Administered 2011-09-27: 0.3 mg via TRANSDERMAL
  Filled 2011-09-27: qty 1

## 2011-09-27 MED ORDER — INSULIN GLARGINE 100 UNIT/ML ~~LOC~~ SOLN
25.0000 [IU] | Freq: Every day | SUBCUTANEOUS | Status: DC
  Administered 2011-09-27: 25 [IU] via SUBCUTANEOUS

## 2011-09-27 MED ORDER — ONDANSETRON HCL 4 MG PO TABS: 4.0000 mg | ORAL_TABLET | Freq: Four times a day (QID) | ORAL | Status: DC | PRN

## 2011-09-27 NOTE — ED Provider Notes (Signed)
History     CSN: [id number]  Arrival date & time 09/27/11  1033   First MD Initiated Contact with Patient 09/27/11 1122      Chief Complaint  Patient presents with  . Back Pain    right side spasm radiating to the right leg    (Consider location/radiation/quality/duration/timing/severity/associated sxs/prior treatment) HPI Comments: History of lumbar spine fusion.  Here for worsening pain since trying to get out of bed this morning.  Patient is a 71 y.o. male presenting with back pain. The history is provided by the patient.  Back Pain  This is a recurrent problem. The current episode started 3 to 5 hours ago. The problem occurs constantly. The problem has not changed since onset.The pain is associated with no known injury. The pain is present in the lumbar spine. The quality of the pain is described as shooting and stabbing. Radiates to: right buttock. The pain is at a severity of 10/10. The pain is severe. The pain is the same all the time.    Past Medical History  Diagnosis Date  . Diabetes mellitus   . Hypertension     Past Surgical History  Procedure Date  . Back surgery     No family history on file.  History  Substance Use Topics  . Smoking status: Never Smoker   . Smokeless tobacco: Not on file  . Alcohol Use: Yes      Review of Systems  Musculoskeletal: Positive for back pain.  All other systems reviewed and are negative.    Allergies  Penicillins  Home Medications   Current Outpatient Rx  Name Route Sig Dispense Refill  . BEN GAY EX Apply externally Apply 1 application topically 2 (two) times daily as needed. For pain    . NAPROXEN SODIUM 220 MG PO TABS Oral Take 660 mg by mouth 2 (two) times daily as needed. For pain      BP 155/105  Pulse 87  Temp(Src) 97.2 F (36.2 C) (Oral)  Resp 20  SpO2 97%  Physical Exam  Nursing note and vitals reviewed. Constitutional: He is oriented to person, place, and time. He appears well-developed and  well-nourished.       The patient appears very uncomfortable.  Neck: Normal range of motion. Neck supple.  Cardiovascular: Normal rate, regular rhythm and normal heart sounds.   Pulmonary/Chest: Effort normal and breath sounds normal.  Abdominal: Soft. Bowel sounds are normal. He exhibits no distension.  Musculoskeletal:       There is ttp in the soft tissues of the lumbar spine.  The dtr's are trace and equal.    Neurological: He is alert and oriented to person, place, and time.  Skin: Skin is warm and dry.    ED Course  Procedures (including critical care time)  Labs Reviewed  GLUCOSE, CAPILLARY - Abnormal; Notable for the following:    Glucose-Capillary 289 (*)    All other components within normal limits   No results found.   No diagnosis found.    MDM  The patient has received no relief with multiple doses of pain meds.  I have consulted medicine to have him admitted for intractable back pain.          Geoffery Lyons, MD 09/27/11 [phone]

## 2011-09-27 NOTE — ED Notes (Signed)
CBG registered [id number] on ED Glucometer

## 2011-09-27 NOTE — ED Notes (Signed)
Denies recent injury 

## 2011-09-27 NOTE — ED Notes (Signed)
Pt in from home with back pain hx of back surgery pr ems pt is having pain 10/10 spasms, 180/110 bp per ems pt states can't afford medications cbg 283 in route

## 2011-09-27 NOTE — ED Notes (Signed)
Pt in from home via ems with right side back pain states radiating to the leg states worsening this am pt has a hx of back pain and back surgery (05-28-2011)

## 2011-09-27 NOTE — ED Notes (Signed)
Spoke with Dr. Susie Cassette states she does not want patient admitted to [facility]

## 2011-09-27 NOTE — ED Notes (Signed)
CBG registered [id number] on ED Glucometer

## 2011-09-27 NOTE — ED Notes (Signed)
CBG registered [id number] on ED Glucometer

## 2011-09-27 NOTE — H&P (Signed)
Donald Calderon MRN: [id number] DOB/AGE: 09/23/1940 71 y.o. Primary Care Physician:Scott [place], [place], [place] Admit date: 09/27/2011 Chief Complaint: Back pain  HPI: Patient is a 71 y.o. male presenting with back pain. The history is provided by the patient.  Back Pain . The patient woke up with this pain. He denies any trauma to his back. Denies lifting any heavy weights. The pain radiates into his right buttock and into his right leg. He is noted intermittent swelling of his right lower extremity for the last couple of weeks. Patient had back surgery in January of 2012   Pain is aggravated with bending over and rotation of the lumbar spine.The pain is associated with no known injury. He denies any fever chills or rigors. The quality of the pain is described as shooting and stabbing. Radiates to: right buttock. The pain is at a severity of 10/10. The pain is severe. The pain is the same all the time.   He is also noted to be significantly hypertensive with systolic blood pressure greater than 200. He states that he has been noncompliant with his antihypertensive medications and has not been able to afford his insulin for   Past Medical History  Diagnosis Date  . Diabetes mellitus   . Hypertension    Spinal Stenosis  Hyperlipidemia  ? h/o Colon Cancer  a. first told he had cancer then had repeat colo and was told he did not have cancer  b. record of colo done in 2002 avail; + hem's; no mention of polyps  c. states last colo 2007(?)  Large right neck lipoma  a. CT 01/2010: Large lipoma in the right neck. This appears to be an intramuscular lipoma of the sternocleidomastoid muscle and appears to be a simple lipoma. No soft tissue mass.     Past Surgical History:  Last updated: 02/07/2010  Lumbar laminectomy  a. L4-5; 2003; Dr. Channing Mutters  b. MRI 12/2009: L4-5 prior laminectomy; mod disc degen; L3-4 mod-severe spinal stenosis  s/p crainial surgery by Dr. Phoebe Perch (congenital CSF leak - ?)  a. no record  in [place]   Past Surgical History  Procedure Date  . Back surgery     Prior to Admission medications   Medication Sig Start Date End Date Taking? Authorizing Provider  Liniments (BEN GAY EX) Apply 1 application topically 2 (two) times daily as needed. For pain   Yes Historical Provider, MD  naproxen sodium (ANAPROX) 220 MG tablet Take 660 mg by mouth 2 (two) times daily as needed. For pain   Yes Historical Provider, MD  His home medications in 2011  1) Metformin Hcl 500 Mg Tabs (Metformin Hcl) .... Take 1 Tablet By Mouth Two Times A Day For Diabetes  2) Lantus 100 Unit/ml Soln (Insulin Glargine) .... Inject 10 Units At Bedtime For Diabetes  3) Insulin Syringes .... Inject As Directed  4) Glucose Meter .... Check Sugar Three Times A Day Before Each Meal  5) Glucose Meter Strips .... Check Sugar Three Times A Day Before Each Meal  6) Lancets .... Check Sugar Three Times A Day  7) Lisinopril 20 Mg Tabs (Lisinopril) .... Take 1 Tablet By Mouth Once A Day For Blood Pressure  8) Oxycodone Hcl 5 Mg Tabs (Oxycodone Hcl) .... Take 1-2 Tabs Every 8 Hours As Needed For Severe Pain  rior medications in 2011   Allergies:  Allergies  Allergen Reactions  . Penicillins Rash    No family history on file.  Social History:  reports that  he has never smoked. He does not have any smokeless tobacco history on file. He reports that he drinks alcohol. He reports that he does not use illicit drugs.         XBJ:YNWGNF of Systems  Musculoskeletal: Positive for back pain.  All other systems reviewed and are negative.   PHYSICAL EXAM: Blood pressure 232/119, pulse 89, temperature 97.9 F (36.6 C), temperature source Oral, resp. rate 18, SpO2 93.00%. Constitutional: He is oriented to person, place, and time. He appears well-developed and well-nourished.  The patient appears very uncomfortable.  Neck: Normal range of motion. Neck supple.  Cardiovascular: Normal rate, regular rhythm and normal  heart sounds.  Pulmonary/Chest: Effort normal and breath sounds normal.  Abdominal: Soft. Bowel sounds are normal. He exhibits no distension.  Musculoskeletal:  There is ttp in the soft tissues of the lumbar spine. The dtr's are trace and equal. Increased paraspinal muscle spasm . Deep tendon reflexes 2+ bilaterally  Neurological: He is alert and oriented to person, place, and time.  Skin: Skin is warm and dry.  Psychiatric appropriate mood and affect  No results found for this or any previous visit (from the past 240 hour(s)).   Results for orders placed during the hospital encounter of 09/27/11 (from the past 48 hour(s))  GLUCOSE, CAPILLARY     Status: Abnormal   Collection Time   09/27/11 10:43 AM      Component Value Range Comment   Glucose-Capillary 289 (*) 70 - 99 (mg/dL)   URINALYSIS, ROUTINE W REFLEX MICROSCOPIC     Status: Abnormal   Collection Time   09/27/11  3:40 PM      Component Value Range Comment   Color, Urine YELLOW  YELLOW     APPearance CLEAR  CLEAR     Specific Gravity, Urine 1.024  1.005 - 1.030     pH 7.5  5.0 - 8.0     Glucose, UA >1000 (*) NEGATIVE (mg/dL)    Hgb urine dipstick NEGATIVE  NEGATIVE     Bilirubin Urine NEGATIVE  NEGATIVE     Ketones, ur 40 (*) NEGATIVE (mg/dL)    Protein, ur 30 (*) NEGATIVE (mg/dL)    Urobilinogen, UA 0.2  0.0 - 1.0 (mg/dL)    Nitrite NEGATIVE  NEGATIVE     Leukocytes, UA NEGATIVE  NEGATIVE    URINE MICROSCOPIC-ADD ON     Status: Normal   Collection Time   09/27/11  3:40 PM      Component Value Range Comment   RBC / HPF 0-2  <3 (RBC/hpf)    Bacteria, UA RARE  RARE    CBC     Status: Normal   Collection Time   09/27/11  3:55 PM      Component Value Range Comment   WBC 8.6  4.0 - 10.5 (K/uL)    RBC 4.90  4.22 - 5.81 (MIL/uL)    Hemoglobin 14.8  13.0 - 17.0 (g/dL)    HCT 62.1  30.8 - 65.7 (%)    MCV 88.0  78.0 - 100.0 (fL)    MCH 30.2  26.0 - 34.0 (pg)    MCHC 34.3  30.0 - 36.0 (g/dL)    RDW 84.6  96.2 - 95.2 (%)     Platelets 244  150 - 400 (K/uL)   DIFFERENTIAL     Status: Abnormal   Collection Time   09/27/11  3:55 PM      Component Value Range Comment   Neutrophils Relative 93 (*)  43 - 77 (%)    Neutro Abs 8.0 (*) 1.7 - 7.7 (K/uL)    Lymphocytes Relative 6 (*) 12 - 46 (%)    Lymphs Abs 0.5 (*) 0.7 - 4.0 (K/uL)    Monocytes Relative 1 (*) 3 - 12 (%)    Monocytes Absolute 0.1  0.1 - 1.0 (K/uL)    Eosinophils Relative 0  0 - 5 (%)    Eosinophils Absolute 0.0  0.0 - 0.7 (K/uL)    Basophils Relative 0  0 - 1 (%)    Basophils Absolute 0.0  0.0 - 0.1 (K/uL)   COMPREHENSIVE METABOLIC PANEL     Status: Abnormal   Collection Time   09/27/11  3:55 PM      Component Value Range Comment   Sodium 132 (*) 135 - 145 (mEq/L)    Potassium 3.8  3.5 - 5.1 (mEq/L)    Chloride 93 (*) 96 - 112 (mEq/L)    CO2 26  19 - 32 (mEq/L)    Glucose, Bld 312 (*) 70 - 99 (mg/dL)    BUN 12  6 - 23 (mg/dL)    Creatinine, Ser 1.61  0.50 - 1.35 (mg/dL)    Calcium 9.5  8.4 - 10.5 (mg/dL)    Total Protein 7.6  6.0 - 8.3 (g/dL)    Albumin 4.1  3.5 - 5.2 (g/dL)    AST 19  0 - 37 (U/L)    ALT 13  0 - 53 (U/L)    Alkaline Phosphatase 72  39 - 117 (U/L)    Total Bilirubin 0.6  0.3 - 1.2 (mg/dL)    GFR calc non Af Amer >90  >90 (mL/min)    GFR calc Af Amer >90  >90 (mL/min)   GLUCOSE, CAPILLARY     Status: Abnormal   Collection Time   09/27/11  4:23 PM      Component Value Range Comment   Glucose-Capillary 299 (*) 70 - 99 (mg/dL)   GLUCOSE, CAPILLARY     Status: Abnormal   Collection Time   09/27/11  5:06 PM      Component Value Range Comment   Glucose-Capillary 309 (*) 70 - 99 (mg/dL)     Dg Lumbar Spine Complete  09/27/2011  *RADIOLOGY REPORT*  Clinical Data: Low back pain radiating down right leg.  LUMBAR SPINE - COMPLETE 4+ VIEW  Comparison: 10/30/2010  Findings: The same numbering system will be utilized as on the prior radiographs with no ribs identified at T12.  Normal alignment is noted. There is no evidence of fracture  or subluxation. Moderate degenerative disc disease, spondylosis and facet arthropathy from L3-L1 identified. There is no evidence of focal bony lesion or spondylolysis.  IMPRESSION: No evidence of acute abnormality.  Unchanged moderate degenerative changes of the lower lumbar spine.  Original Report Authenticated By: Rosendo Gros, M.D.    Impression:  Principal Problem:  *Back pain, acute Active Problems:  DIABETES MELLITUS, TYPE II  HYPERLIPIDEMIA  PERIPHERAL NEUROPATHY  SPINAL STENOSIS, LUMBAR     Plan: #1 back pain will start the patient on IV Solu-Medrol since he has failed narcotic treatment. Start Robaxin, K pad, MRI of the lumbar spine.  #2 diabetes insulin-dependent in the past, the patient has not been taking his insulin. Check hemoglobin A1c. Start the patient on Lantus as anticipated the patient will have extremely high blood sugars once he started on Solu-Medrol #3 accelerated hypertension worsened by his back pain, start IV hydralazine as needed. The patient  hasn't been placed on lisinopril, Norvasc, IV enalaprilat full   Alsace Dowd 09/27/2011, 6:06 PM

## 2011-09-27 NOTE — ED Notes (Signed)
Pt c/o pain after being moved in radiology. Verbal order given to repeat prior morphine dose.

## 2011-09-27 NOTE — ED Notes (Signed)
Bed:WA03<BR> Expected date:<BR> Expected time:<BR> Means of arrival:<BR> Comments:<BR> ems

## 2011-09-28 ENCOUNTER — Inpatient Hospital Stay (HOSPITAL_COMMUNITY): Payer: Medicare Other

## 2011-09-28 DIAGNOSIS — M7989 Other specified soft tissue disorders: Secondary | ICD-10-CM

## 2011-09-28 LAB — CBC
MCH: 29.8 pg (ref 26.0–34.0)
MCHC: 34.4 g/dL (ref 30.0–36.0)
Platelets: 248 10*3/uL (ref 150–400)
RBC: 4.47 MIL/uL (ref 4.22–5.81)
RDW: 12.3 % (ref 11.5–15.5)

## 2011-09-28 LAB — GLUCOSE, CAPILLARY: Glucose-Capillary: 237 mg/dL — ABNORMAL HIGH (ref 70–99)

## 2011-09-28 MED ORDER — INSULIN GLARGINE 100 UNIT/ML ~~LOC~~ SOLN
40.0000 [IU] | Freq: Every day | SUBCUTANEOUS | Status: DC
  Administered 2011-09-28: 40 [IU] via SUBCUTANEOUS

## 2011-09-28 MED ORDER — INSULIN GLARGINE 100 UNIT/ML ~~LOC~~ SOLN
20.0000 [IU] | Freq: Once | SUBCUTANEOUS | Status: AC
  Administered 2011-09-28: 20 [IU] via SUBCUTANEOUS

## 2011-09-28 MED ORDER — LIVING WELL WITH DIABETES BOOK
Freq: Once | Status: AC
  Administered 2011-09-28: 14:00:00
  Filled 2011-09-28: qty 1

## 2011-09-28 MED ORDER — GADOBENATE DIMEGLUMINE 529 MG/ML IV SOLN
17.0000 mL | Freq: Once | INTRAVENOUS | Status: AC | PRN
  Administered 2011-09-28: 17 mL via INTRAVENOUS

## 2011-09-28 MED ORDER — INSULIN ASPART 100 UNIT/ML ~~LOC~~ SOLN
10.0000 [IU] | Freq: Once | SUBCUTANEOUS | Status: AC
  Administered 2011-09-28: 10 [IU] via SUBCUTANEOUS

## 2011-09-28 NOTE — Progress Notes (Signed)
Subjective: Back pain somewhat improved\ bp improved overnight  Objective: Vital signs in last 24 hours: Filed Vitals:   09/27/11 2147 09/28/11 0146 09/28/11 0442 09/28/11 0925  BP: 176/88 132/85 150/87 176/87  Pulse: 99 98 91   Temp: 98.6 F (37 C) 98.5 F (36.9 C) 98.9 F (37.2 C)   TempSrc: Oral Oral Oral   Resp: 18 18 18    Height:      Weight:      SpO2: 96% 96% 93%     Intake/Output Summary (Last 24 hours) at 09/28/11 0926 Last data filed at 09/28/11 0600  Gross per 24 hour  Intake 1882.5 ml  Output   1500 ml  Net  382.5 ml    Weight change:   Constitutional: He is oriented to person, place, and time. He appears well-developed and well-nourished.  The patient appears very uncomfortable.  Neck: Normal range of motion. Neck supple.  Cardiovascular: Normal rate, regular rhythm and normal heart sounds.  Pulmonary/Chest: Effort normal and breath sounds normal.  Abdominal: Soft. Bowel sounds are normal. He exhibits no distension.  Musculoskeletal:  There is ttp in the soft tissues of the lumbar spine. The dtr's are trace and equal. Increased paraspinal muscle spasm . Deep tendon reflexes 2+ bilaterally  Neurological: He is alert and oriented to person, place, and time.  Skin: Skin is warm and dry.  Psychiatric appropriate mood and affect   Lab Results: Results for orders placed during the hospital encounter of 09/27/11 (from the past 24 hour(s))  GLUCOSE, CAPILLARY     Status: Abnormal   Collection Time   09/27/11 10:43 AM      Component Value Range   Glucose-Capillary 289 (*) 70 - 99 (mg/dL)  URINALYSIS, ROUTINE W REFLEX MICROSCOPIC     Status: Abnormal   Collection Time   09/27/11  3:40 PM      Component Value Range   Color, Urine YELLOW  YELLOW    APPearance CLEAR  CLEAR    Specific Gravity, Urine 1.024  1.005 - 1.030    pH 7.5  5.0 - 8.0    Glucose, UA >1000 (*) NEGATIVE (mg/dL)   Hgb urine dipstick NEGATIVE  NEGATIVE    Bilirubin Urine NEGATIVE  NEGATIVE     Ketones, ur 40 (*) NEGATIVE (mg/dL)   Protein, ur 30 (*) NEGATIVE (mg/dL)   Urobilinogen, UA 0.2  0.0 - 1.0 (mg/dL)   Nitrite NEGATIVE  NEGATIVE    Leukocytes, UA NEGATIVE  NEGATIVE   URINE MICROSCOPIC-ADD ON     Status: Normal   Collection Time   09/27/11  3:40 PM      Component Value Range   RBC / HPF 0-2  <3 (RBC/hpf)   Bacteria, UA RARE  RARE   CBC     Status: Normal   Collection Time   09/27/11  3:55 PM      Component Value Range   WBC 8.6  4.0 - 10.5 (K/uL)   RBC 4.90  4.22 - 5.81 (MIL/uL)   Hemoglobin 14.8  13.0 - 17.0 (g/dL)   HCT 16.1  09.6 - 04.5 (%)   MCV 88.0  78.0 - 100.0 (fL)   MCH 30.2  26.0 - 34.0 (pg)   MCHC 34.3  30.0 - 36.0 (g/dL)   RDW 40.9  81.1 - 91.4 (%)   Platelets 244  150 - 400 (K/uL)  DIFFERENTIAL     Status: Abnormal   Collection Time   09/27/11  3:55 PM  Component Value Range   Neutrophils Relative 93 (*) 43 - 77 (%)   Neutro Abs 8.0 (*) 1.7 - 7.7 (K/uL)   Lymphocytes Relative 6 (*) 12 - 46 (%)   Lymphs Abs 0.5 (*) 0.7 - 4.0 (K/uL)   Monocytes Relative 1 (*) 3 - 12 (%)   Monocytes Absolute 0.1  0.1 - 1.0 (K/uL)   Eosinophils Relative 0  0 - 5 (%)   Eosinophils Absolute 0.0  0.0 - 0.7 (K/uL)   Basophils Relative 0  0 - 1 (%)   Basophils Absolute 0.0  0.0 - 0.1 (K/uL)  COMPREHENSIVE METABOLIC PANEL     Status: Abnormal   Collection Time   09/27/11  3:55 PM      Component Value Range   Sodium 132 (*) 135 - 145 (mEq/L)   Potassium 3.8  3.5 - 5.1 (mEq/L)   Chloride 93 (*) 96 - 112 (mEq/L)   CO2 26  19 - 32 (mEq/L)   Glucose, Bld 312 (*) 70 - 99 (mg/dL)   BUN 12  6 - 23 (mg/dL)   Creatinine, Ser 6.21  0.50 - 1.35 (mg/dL)   Calcium 9.5  8.4 - 30.8 (mg/dL)   Total Protein 7.6  6.0 - 8.3 (g/dL)   Albumin 4.1  3.5 - 5.2 (g/dL)   AST 19  0 - 37 (U/L)   ALT 13  0 - 53 (U/L)   Alkaline Phosphatase 72  39 - 117 (U/L)   Total Bilirubin 0.6  0.3 - 1.2 (mg/dL)   GFR calc non Af Amer >90  >90 (mL/min)   GFR calc Af Amer >90  >90 (mL/min)    HEMOGLOBIN A1C     Status: Abnormal   Collection Time   09/27/11  3:55 PM      Component Value Range   Hemoglobin A1C 10.4 (*) <5.7 (%)   Mean Plasma Glucose 252 (*) <117 (mg/dL)  GLUCOSE, CAPILLARY     Status: Abnormal   Collection Time   09/27/11  4:23 PM      Component Value Range   Glucose-Capillary 299 (*) 70 - 99 (mg/dL)  GLUCOSE, CAPILLARY     Status: Abnormal   Collection Time   09/27/11  5:06 PM      Component Value Range   Glucose-Capillary 309 (*) 70 - 99 (mg/dL)  D-DIMER, QUANTITATIVE     Status: Abnormal   Collection Time   09/27/11  7:15 PM      Component Value Range   D-Dimer, Quant 0.70 (*) 0.00 - 0.48 (ug/mL-FEU)  GLUCOSE, CAPILLARY     Status: Abnormal   Collection Time   09/27/11  9:43 PM      Component Value Range   Glucose-Capillary 335 (*) 70 - 99 (mg/dL)  CBC     Status: Abnormal   Collection Time   09/28/11  4:00 AM      Component Value Range   WBC 11.8 (*) 4.0 - 10.5 (K/uL)   RBC 4.47  4.22 - 5.81 (MIL/uL)   Hemoglobin 13.3  13.0 - 17.0 (g/dL)   HCT 65.7 (*) 84.6 - 52.0 (%)   MCV 86.6  78.0 - 100.0 (fL)   MCH 29.8  26.0 - 34.0 (pg)   MCHC 34.4  30.0 - 36.0 (g/dL)   RDW 96.2  95.2 - 84.1 (%)   Platelets 248  150 - 400 (K/uL)  GLUCOSE, CAPILLARY     Status: Abnormal   Collection Time   09/28/11  8:08 AM  Component Value Range   Glucose-Capillary 263 (*) 70 - 99 (mg/dL)     Micro: No results found for this or any previous visit (from the past 240 hour(s)).  Studies/Results: Dg Lumbar Spine Complete  09/27/2011  *RADIOLOGY REPORT*  Clinical Data: Low back pain radiating down right leg.  LUMBAR SPINE - COMPLETE 4+ VIEW  Comparison: 10/30/2010  Findings: The same numbering system will be utilized as on the prior radiographs with no ribs identified at T12.  Normal alignment is noted. There is no evidence of fracture or subluxation. Moderate degenerative disc disease, spondylosis and facet arthropathy from L3-L1 identified. There is no evidence of  focal bony lesion or spondylolysis.  IMPRESSION: No evidence of acute abnormality.  Unchanged moderate degenerative changes of the lower lumbar spine.  Original Report Authenticated By: Rosendo Gros, M.D.   Mr Lumbar Spine W Wo Contrast  09/28/2011  *RADIOLOGY REPORT*  Clinical Data: Acute low back pain with right buttock and right leg pain.  MRI LUMBAR SPINE WITHOUT AND WITH CONTRAST  Technique:  Multiplanar and multiecho pulse sequences of the lumbar spine were obtained without and with intravenous contrast.  Contrast: 17mL MULTIHANCE GADOBENATE DIMEGLUMINE 529 MG/ML IV SOLN  Comparison: Radiographs dated 09/27/2011 and lumbar MRI dated 12/31/2009  Findings: Scan extends from T11-12 through S4.  Tip of the conus is at T12-L1 and appears normal.  T11-12 and T12-L1:  Normal.  L1-2:  Tiny focal disc protrusion into the left neural foramen without impingement.  L2-3:  Small broad-based disc bulge with slight hypertrophy of the ligamenta flava and facets.  L3-4:  Focal disc extrusion measuring 11 x 11 x 6 mm extending superiorly behind the body of L3 in the right lateral recess discretely compressing the right L3 nerve just proximal and into the right neural foramen.  Hypertrophy of the ligamenta flava and facet joints and a small broad-based disc bulge create severe spinal stenosis and severe bilateral lateral recess stenosis.  L4-5:  Previous right hemilaminotomy.  No disc bulge or protrusion. Severe left lateral recess stenosis due to left facet and ligamentum flavum hypertrophy.  L5 S1:  Mild right facet arthritis.  No abnormality of the disc.  IMPRESSION: 1.  Focal soft disc extrusion at L3-4 extends superiorly into the right lateral recess and right neural foramen compressing the right L3 nerve. 2.  Severe spinal stenosis at L3-4. 3.  Severe left lateral recess stenosis at L4-5.  Original Report Authenticated By: Gwynn Burly, M.D.    Medications:  Scheduled Meds:   . alum & mag hydroxide-simeth  30  mL Oral Once  . amLODipine  5 mg Oral Daily  . cloNIDine  0.3 mg Transdermal Weekly  . enoxaparin  40 mg Subcutaneous Q24H  .  HYDROmorphone (DILAUDID) injection  1 mg Intravenous Once  .  HYDROmorphone (DILAUDID) injection  1 mg Intravenous Once  . insulin aspart  0-20 Units Subcutaneous TID WC  . insulin glargine  40 Units Subcutaneous QHS  . ketorolac  30 mg Intravenous Once  . labetalol  100 mg Oral To ER  . lisinopril  20 mg Oral Daily  . methylPREDNISolone (SOLU-MEDROL) injection  125 mg Intravenous Once  . methylPREDNISolone (SOLU-MEDROL) injection  40 mg Intravenous Q12H  .  morphine injection  4 mg Intravenous Once  .  morphine injection  8 mg Intravenous Once  . DISCONTD: sodium chloride   Intravenous STAT  . DISCONTD: insulin glargine  25 Units Subcutaneous QHS   Continuous Infusions:   .  sodium chloride 75 mL/hr at 09/27/11 1830   PRN Meds:.albuterol, enalaprilat, gadobenate dimeglumine, hydrALAZINE, HYDROmorphone, methocarbamol (ROBAXIN) IV, ondansetron (ZOFRAN) IV, ondansetron, oxyCODONE, DISCONTD:  HYDROmorphone (DILAUDID) injection, DISCONTD: ondansetron (ZOFRAN) IV   Assessment: Principal Problem:  *Back pain, acute Active Problems:  DIABETES MELLITUS, TYPE II  HYPERLIPIDEMIA  PERIPHERAL NEUROPATHY  SPINAL STENOSIS, LUMBAR   Plan: #1 back pain will start the patient on IV Solu-Medrol since he has failed narcotic treatment. Start Robaxin, K pad, MRI of the lumbar spine.  MRI shows 1. Focal soft disc extrusion at L3-4 extends superiorly into the  right lateral recess and right neural foramen compressing the right  L3 nerve.  2. Severe spinal stenosis at L3-4.  3. Severe left lateral recess stenosis at L4-5. Call Dr. Channing Mutters, office and left a message about ABR  Called Dr Lindalou Hose office and requested a consult   #2 diabetes insulin-dependent in the past, the patient has not been taking his insulin. Check hemoglobin A1c. Start the patient on Lantus as  anticipated the patient will have extremely high blood sugars once he started on Solu-Medrol   #3 accelerated hypertension worsened by his back pain, start IV hydralazine as needed. The patient hasn't been placed on lisinopril, Norvasc, IV enalaprilat full    LOS: 1 day   [facility] 09/28/2011, 9:26 AM

## 2011-09-28 NOTE — Progress Notes (Signed)
Bilateral lower extremity venous duplex completed.  Preliminary report is negative for DVT, SVT, or a Baker's cyst. 

## 2011-09-28 NOTE — Progress Notes (Signed)
Inpatient Diabetes Program Recommendations  AACE/ADA: New Consensus Statement on Inpatient Glycemic Control (2009)  Target Ranges:  Prepandial:   less than 140 mg/dL      Peak postprandial:   less than 180 mg/dL (1-2 hours)      Critically ill patients:  140 - 180 mg/dL   Reason for Visit: Hyperglycemia  Results for Donald Calderon, Donald Calderon (MRN [id number]) as of 09/28/2011 14:04  Ref. Range 09/27/2011 10:43 09/27/2011 16:23 09/27/2011 17:06 09/27/2011 21:43 09/28/2011 08:08 09/28/2011 11:08  Glucose-Capillary Latest Range: 70-99 mg/dL 409 (H) 811 (H) 914 (H) 335 (H) 263 (H) 460 (H)    Inpatient Diabetes Program Recommendations Correction (SSI): Add HS coverage Insulin - Meal Coverage: Add meal coverage insulin while on steroids - Novolog 4 units tidwc  Note: Long discussion with pt and son regarding glycemic control at home.  Pt states he drinks a lot of juice and eats fried foods and Gingersnaps.  Stressed importance of healthy balanced diet and adding exercise.  Pt states he hasn't been on his diabetes meds for several months d/t finances.  [name] he now has a supplemental insurance so he will be able to get his meds.  Has meter at home.  Encouraged pt to view diabetes videos on pt ed channel.  Will order Living Well with Diabetes book.  Will followup for diabetes care with Dr. Alben Spittle.  Discussed with RN.

## 2011-09-28 NOTE — Evaluation (Signed)
Physical Therapy Evaluation Patient Details Name: DMAURI ROSENOW MRN: [id number] DOB: [birth year] Today's Date: 09/28/2011  Problem List:  Patient Active Problem List  Diagnoses  . LIPOMA  . DIABETES MELLITUS, TYPE II  . HYPERLIPIDEMIA  . HYPOKALEMIA  . ALCOHOL ABUSE  . PERIPHERAL NEUROPATHY  . HYPERTENSION  . SPINAL STENOSIS, LUMBAR  . BENIGN PROSTATIC HYPERTROPHY, WITH URINARY OBSTRUCTION  . SWELLING MASS OR LUMP IN HEAD AND NECK  . NEUROPATHY  . Back pain, acute    Past Medical History:  Past Medical History  Diagnosis Date  . Diabetes mellitus   . Hypertension   . Pneumonia   . Sleep apnea   . GERD (gastroesophageal reflux disease)   . Headache   . Arthritis   . Depression    Past Surgical History:  Past Surgical History  Procedure Date  . Back surgery     PT Assessment/Plan/Recommendation PT Assessment Clinical Impression Statement: Pt presents with acute back pain with history of spinal stenosis s/p 2 back surgeries with decreased strength in RLE and decreased mobility.  Noted moderate R knee instability initially with ambulation in room, however pt with increased stability as he kept ambulating.  Pt states he had MRI this am, however results are pending.  Pt will benefit from skilled PT in acute venue to address deficits.  PT recommends HHPT vs no follow up, pending pt progress and results of MRI.   PT Recommendation/Assessment: Patient will need skilled PT in the acute care venue PT Problem List: Decreased strength;Decreased activity tolerance;Decreased balance;Decreased mobility;Impaired sensation;Pain;Decreased knowledge of use of DME Barriers to Discharge: None PT Therapy Diagnosis : Difficulty walking;Abnormality of gait;Generalized weakness;Acute pain PT Plan PT Frequency: Min 5X/week PT Treatment/Interventions: DME instruction;Gait training;Stair training;Functional mobility training;Therapeutic activities;Therapeutic exercise;Balance  training;Patient/family education PT Recommendation Follow Up Recommendations: No PT follow up;Home health PT (pending pt progress) Equipment Recommended: None recommended by PT PT Goals  Acute Rehab PT Goals PT Goal Formulation: With patient Time For Goal Achievement: 2 weeks Pt will go Sit to Stand: with modified independence PT Goal: Sit to Stand - Progress: Goal set today Pt will go Stand to Sit: with modified independence PT Goal: Stand to Sit - Progress: Goal set today Pt will Ambulate: >150 feet;with modified independence;with least restrictive assistive device PT Goal: Ambulate - Progress: Goal set today Pt will Go Up / Down Stairs: 3-5 stairs;with modified independence;with least restrictive assistive device PT Goal: Up/Down Stairs - Progress: Goal set today Pt will Perform Home Exercise Program: Independently PT Goal: Perform Home Exercise Program - Progress: Goal set today  PT Evaluation Precautions/Restrictions  Precautions Precautions: Fall Restrictions Weight Bearing Restrictions: No Prior Functioning  Home Living Lives With: Spouse Available Help at Discharge: Family Type of Home: House Home Access: Stairs to enter Secretary/administrator of Steps: 3 Entrance Stairs-Rails: None Home Layout: One level Bathroom Shower/Tub: Engineer, manufacturing systems: Standard Home Adaptive Equipment: Environmental consultant - rolling;Straight cane Prior Function Level of Independence: Independent Able to Take Stairs?: Yes Driving: Yes Vocation: Retired Financial risk analyst Arousal/Alertness: Awake/alert Overall Cognitive Status: Appears within functional limits for tasks assessed Sensation/Coordination Sensation Light Touch: Impaired by gross assessment Additional Comments: Pt states that he has very little sensation in B feet, asked pt if he has diabetic neuropathy, pt states he has not heard of that but does have numbess/tingling in the feet and cannot tell where they are when he  walks.  Coordination Gross Motor Movements are Fluid and Coordinated: Yes Extremity Assessment RLE Assessment RLE  Assessment: Exceptions to [facility] RLE Strength RLE Overall Strength Comments: hip flex 3+/5, knee ext 3+/5, knee flex 4/5, ankle DF 5/5 LLE Assessment LLE Assessment: Exceptions to [facility] LLE Strength LLE Overall Strength Comments: hip flex 4/5, knee flex/ext 5/5, ankle DF 5/5 Mobility (including Balance) Bed Mobility Bed Mobility: Yes Rolling Right: 5: Supervision Rolling Right Details (indicate cue type and reason): Supervision for safety with pt correctly demonstrating log roll.   Right Sidelying to Sit: 5: Supervision Right Sidelying to Sit Details (indicate cue type and reason): Supervision for safety with pt correctly correctly demonstrating technique.  Transfers Transfers: Yes Sit to Stand: 3: Mod assist;From elevated surface;With upper extremity assist;From bed Sit to Stand Details (indicate cue type and reason): Mod assist for rise due to R knee instability with cues for hand placement on bed.   Stand to Sit: 4: Min assist;With upper extremity assist;With armrests;To chair/3-in-1 Stand to Sit Details: Min/guard for safety with cues for hand placement and safety.  Ambulation/Gait Ambulation/Gait: Yes Ambulation/Gait Assistance: 4: Min assist;3: Mod assist Ambulation/Gait Assistance Details (indicate cue type and reason): Mod assist initially due to R knee instability/buckling, however this was noted to improve with increased ambulation.  Cues for sequencing/technique with RW.   Ambulation Distance (Feet): 200 Feet Assistive device: Rolling walker Gait Pattern: Step-through pattern (R knee instability) Gait velocity: increased initially, cues for slower gait speed for safety.  Stairs: No Naval architect Mobility: No    Exercise    End of Session PT - End of Session Equipment Utilized During Treatment: Gait belt Activity Tolerance: Patient tolerated  treatment well Patient left: in chair;with call bell in reach Nurse Communication: Mobility status for transfers;Mobility status for ambulation General Behavior During Session: [facility] for tasks performed Cognition: [facility] for tasks performed Patient Class: Inpatient Page, Meribeth Mattes 09/28/2011, 10:17 AM

## 2011-09-28 NOTE — Clinical Documentation Improvement (Signed)
Hypertension Documentation Clarification Query  THIS DOCUMENT IS NOT A PERMANENT PART OF THE MEDICAL RECORD  TO RESPOND TO THE THIS QUERY, FOLLOW THE INSTRUCTIONS BELOW:  1. If needed, update documentation for the patient's encounter via the notes activity.  2. Access this query again and click edit on the In [employer].  3. After updating, or not, click F2 to complete all highlighted (required) fields concerning your review. Select "additional documentation in the medical record" OR "no additional documentation provided".  4. Click Sign note button.  5. The deficiency will fall out of your In Basket *Please let us know if you are not able to complete this workflow by phone or e-mail (listed below).        09/28/11  Dear Dr. Susie Cassette, Dorris Carnes Marton Redwood  In an effort to better capture your patient's severity of illness, reflect appropriate length of stay and utilization of resources, a review of the patient medical record has revealed the following indicators.    Based on your clinical judgment, please clarify and document in a progress note and/or discharge summary the clinical condition associated with the following supporting information:  In responding to this query please exercise your independent judgment.  The fact that a query is asked, does not imply that any particular answer is desired or expected.  Pt admitted with severe back pain.  Pt's B/P range (176/88-232/119) in setting of severe back pain and H/O HTN   Please clarify if elevated B/P can be linked to another a more acute form of HTN and document in pn or d/c summary.  Possible Clinical Conditions?   " Hypertension  " Accelerated Hypertension  " Malignant Hypertension  " Or Other Condition __________________________  " Cannot Clinically Determine   Supporting Information:  Risk Factors: Back pain, H/O HTN, DM  Signs and Symptoms: SBP range: DBP  range: Encephalopathy?  Diagnostics: 09/27/11 176/88  09/26/11 B/P 200/114 223/129 232/119  Treatment: NORMODYNE PRINIVIL, NORVASC CATAPRES    You may use possible, probable, or suspect with inpatient documentation. Possible, probable, suspected diagnoses MUST be documented at the time of discharge.  Reviewed: additional documentation in the medical record ljh  Thank You,  Enis Slipper  RN, BSN, CCDS Clinical Documentation Specialist [facility] HIM Dept Pager: [phone] / E-mail: [email or web address]  Health Information Management [place]

## 2011-09-29 LAB — GLUCOSE, CAPILLARY: Glucose-Capillary: 263 mg/dL — ABNORMAL HIGH (ref 70–99)

## 2011-09-29 MED ORDER — AMLODIPINE BESYLATE 5 MG PO TABS: 10.0000 mg | ORAL_TABLET | Freq: Every day | ORAL | Status: DC

## 2011-09-29 MED ORDER — METHOCARBAMOL 500 MG PO TABS: 500.0000 mg | ORAL_TABLET | Freq: Three times a day (TID) | ORAL | Status: AC | PRN

## 2011-09-29 MED ORDER — LISINOPRIL 20 MG PO TABS: 20.0000 mg | ORAL_TABLET | Freq: Every day | ORAL | Status: DC

## 2011-09-29 MED ORDER — CLONIDINE HCL 0.3 MG/24HR TD PTWK: 1.0000 | MEDICATED_PATCH | TRANSDERMAL | Status: DC

## 2011-09-29 MED ORDER — PREDNISONE 5 MG PO TABS: 30.0000 mg | ORAL_TABLET | Freq: Every day | ORAL | Status: AC

## 2011-09-29 MED ORDER — OXYCODONE HCL 5 MG PO TABS: 5.0000 mg | ORAL_TABLET | Freq: Three times a day (TID) | ORAL | Status: AC | PRN

## 2011-09-29 MED ORDER — INSULIN GLARGINE 100 UNIT/ML ~~LOC~~ SOLN: 40.0000 [IU] | Freq: Every day | SUBCUTANEOUS | Status: DC

## 2011-09-29 NOTE — Progress Notes (Signed)
Physical Therapy Treatment Patient Details Name: Donald Calderon MRN: [id number] DOB: 05-21-1941 Today's Date: 09/29/2011  PT Assessment/Plan  PT - Assessment/Plan Comments on Treatment Session: Pt doing well with ambulation with min cues for safety.  Again, noted somewhat improved knee control on R LE, however noted decreased foot clearance.  Pt can tell that his foot is clearing as well as it should.  PT Plan: Discharge plan remains appropriate PT Frequency: Min 5X/week Follow Up Recommendations: No PT follow up;Home health PT Equipment Recommended: None recommended by PT PT Goals  Acute Rehab PT Goals PT Goal Formulation: With patient Time For Goal Achievement: 2 weeks Pt will go Sit to Stand: with modified independence PT Goal: Sit to Stand - Progress: Progressing toward goal Pt will go Stand to Sit: with modified independence PT Goal: Stand to Sit - Progress: Progressing toward goal Pt will Ambulate: >150 feet;with modified independence;with least restrictive assistive device PT Goal: Ambulate - Progress: Progressing toward goal Pt will Perform Home Exercise Program: Independently PT Goal: Perform Home Exercise Program - Progress: Progressing toward goal  PT Treatment Precautions/Restrictions  Precautions Precautions: Fall Restrictions Weight Bearing Restrictions: No Mobility (including Balance) Bed Mobility Bed Mobility: Yes Rolling Right: 5: Supervision Rolling Right Details (indicate cue type and reason): Supervision for safety  Right Sidelying to Sit: 5: Supervision Right Sidelying to Sit Details (indicate cue type and reason): Supervision for safety  Transfers Transfers: Yes Sit to Stand: 4: Min assist;With upper extremity assist;From elevated surface;From bed Sit to Stand Details (indicate cue type and reason): Min guard for safety.  Noted somewhat better R knee control during todays session.  Stand to Sit: 4: Min assist;With upper extremity assist;With armrests;To  chair/3-in-1 Stand to Sit Details: Min/guard for safety with mod cuing for keeping RW with pt until all the way at chair for safety.  Ambulation/Gait Ambulation/Gait: Yes Ambulation/Gait Assistance: 4: Min assist;3: Mod assist Ambulation/Gait Assistance Details (indicate cue type and reason): Continue to provide Min/Mod assist due to pt stating his R leg still feels weak.  Noted that pt unable to pick up (DF) R ankle, pt is aware of this when he ambulates.  Ambulation Distance (Feet): 200 Feet Assistive device: Rolling walker Gait Pattern: Step-through pattern    Exercise  General Exercises - Lower Extremity Long Arc Quad: Strengthening;Both;20 reps;Seated Hip Flexion/Marching: Strengthening;Both;Other reps (comment);Seated (20 reps) Toe Raises: Strengthening;Both;20 reps;Seated Heel Raises: Strengthening;Both;Other reps (comment);Seated (20) End of Session PT - End of Session Equipment Utilized During Treatment: Gait belt Activity Tolerance: Patient limited by pain Patient left: in chair;with call bell in reach General Behavior During Session: [facility] for tasks performed Cognition: [facility] for tasks performed  Page, Meribeth Mattes 09/29/2011, 9:48 AM

## 2011-09-29 NOTE — Progress Notes (Signed)
CARE MANAGEMENT NOTE 09/29/2011  Patient:  Donald Calderon, Donald Calderon   Account Number:  [id number]  Date Initiated:  09/29/2011  Documentation initiated by:  Colleen Can  Subjective/Objective Assessment:   DX ACUTE BACK PAIN ,PMHX HTN, SPINAL STENOSIS, LUMBAR SPINAL FUSION 2011  MD-SCOTT WEAVER  NEURO-DR ROY  Pharmacy-[facility] [address]     Action/Plan:   CM spoke with patient and spouse. Plans are for patient to retun to his home in [place] where spouse will be caregiver. Pt currently has prescription coverage thru his ins which was effective Jun 16, 2011   Anticipated DC Date:  09/29/2011   Anticipated DC Plan:  HOME W HOME HEALTH SERVICES  In-house referral  NA      DC Planning Services  CM consult      [facility] Choice  HOME HEALTH   Choice offered to / List presented to:  C-1 Patient   DME arranged  NA      DME agency  NA     HH arranged  HH-2 PT  HH-3 OT      [facility] agency  [facility]   Status of service:  Completed, signed off Medicare Important Message given?  NA - LOS <3 / Initial given by admissions (If response is "NO", the following Medicare IM given date fields will be blank) Date Medicare IM given:   Date Additional Medicare IM given:    Discharge Disposition:  HOME W HOME HEALTH SERVICES  Comments:  09/29/2011 Raynelle Bring BSN CCM [phone] Pt already has RW. HHpt/ot has been ordered by MD. Choice list of [facility] agencies offered to patient. He chose [facility]. [facility] services with start date of tomorrow 09/30/2011. List of HH agencies placed in shadow chart. Pt will follow up with Dr.Roy. Pt understands the importance of getting prescriptions filled and taking meds as prescribed.

## 2011-09-29 NOTE — Discharge Summary (Addendum)
Physician Discharge Summary  DHRUVA ORNDOFF MRN: [id number] DOB/AGE: 71-Feb-1942 71 y.o.  PCP: No primary provider on file.   Admit date: 09/27/2011 Discharge date: 09/29/2011  Discharge Diagnoses:     *Back pain, acute Focal soft disc extrusion at L3-4 Accelerated Hypertension   DIABETES MELLITUS, TYPE II  HYPERLIPIDEMIA  PERIPHERAL NEUROPATHY  SPINAL STENOSIS, LUMBAR   Medication List  As of 09/29/2011  3:26 PM   TAKE these medications         amLODipine 5 MG tablet   Commonly known as: NORVASC   Take 2 tablets (10 mg total) by mouth daily.      BEN GAY EX   Apply 1 application topically 2 (two) times daily as needed. For pain      cloNIDine 0.3 mg/24hr   Commonly known as: CATAPRES - Dosed in mg/24 hr   Place 1 patch (0.3 mg total) onto the skin once a week.      insulin glargine 100 UNIT/ML injection   Commonly known as: LANTUS   Inject 40 Units into the skin at bedtime.      lisinopril 20 MG tablet   Commonly known as: PRINIVIL,ZESTRIL   Take 1 tablet (20 mg total) by mouth daily.      methocarbamol 500 MG tablet   Commonly known as: ROBAXIN   Take 1 tablet (500 mg total) by mouth 3 (three) times daily as needed.      naproxen sodium 220 MG tablet   Commonly known as: ANAPROX   Take 660 mg by mouth 2 (two) times daily as needed. For pain      oxyCODONE 5 MG immediate release tablet   Commonly known as: Oxy IR/ROXICODONE   Take 1 tablet (5 mg total) by mouth every 8 (eight) hours as needed.      predniSONE 5 MG tablet   Commonly known as: DELTASONE   Take 6 tablets (30 mg total) by mouth daily.            Discharge Condition: Stable Disposition:    Consults: None  Significant Diagnostic Studies: Dg Lumbar Spine Complete  09/27/2011  *RADIOLOGY REPORT*  Clinical Data: Low back pain radiating down right leg.  LUMBAR SPINE - COMPLETE 4+ VIEW  Comparison: 10/30/2010  Findings: The same numbering system will be utilized as on the prior  radiographs with no ribs identified at T12.  Normal alignment is noted. There is no evidence of fracture or subluxation. Moderate degenerative disc disease, spondylosis and facet arthropathy from L3-L1 identified. There is no evidence of focal bony lesion or spondylolysis.  IMPRESSION: No evidence of acute abnormality.  Unchanged moderate degenerative changes of the lower lumbar spine.  Original Report Authenticated By: Rosendo Gros, M.D.   Mr Lumbar Spine W Wo Contrast  09/28/2011  *RADIOLOGY REPORT*  Clinical Data: Acute low back pain with right buttock and right leg pain.  MRI LUMBAR SPINE WITHOUT AND WITH CONTRAST  Technique:  Multiplanar and multiecho pulse sequences of the lumbar spine were obtained without and with intravenous contrast.  Contrast: 17mL MULTIHANCE GADOBENATE DIMEGLUMINE 529 MG/ML IV SOLN  Comparison: Radiographs dated 09/27/2011 and lumbar MRI dated 12/31/2009  Findings: Scan extends from T11-12 through S4.  Tip of the conus is at T12-L1 and appears normal.  T11-12 and T12-L1:  Normal.  L1-2:  Tiny focal disc protrusion into the left neural foramen without impingement.  L2-3:  Small broad-based disc bulge with slight hypertrophy of the ligamenta flava and facets.  L3-4:  Focal disc extrusion measuring 11 x 11 x 6 mm extending superiorly behind the body of L3 in the right lateral recess discretely compressing the right L3 nerve just proximal and into the right neural foramen.  Hypertrophy of the ligamenta flava and facet joints and a small broad-based disc bulge create severe spinal stenosis and severe bilateral lateral recess stenosis.  L4-5:  Previous right hemilaminotomy.  No disc bulge or protrusion. Severe left lateral recess stenosis due to left facet and ligamentum flavum hypertrophy.  L5 S1:  Mild right facet arthritis.  No abnormality of the disc.  IMPRESSION: 1.  Focal soft disc extrusion at L3-4 extends superiorly into the right lateral recess and right neural foramen compressing  the right L3 nerve. 2.  Severe spinal stenosis at L3-4. 3.  Severe left lateral recess stenosis at L4-5.  Original Report Authenticated By: Gwynn Burly, M.D.      Microbiology: No results found for this or any previous visit (from the past 240 hour(s)).   Labs: Results for orders placed during the hospital encounter of 09/27/11 (from the past 48 hour(s))  URINALYSIS, ROUTINE W REFLEX MICROSCOPIC     Status: Abnormal   Collection Time   09/27/11  3:40 PM      Component Value Range Comment   Color, Urine YELLOW  YELLOW     APPearance CLEAR  CLEAR     Specific Gravity, Urine 1.024  1.005 - 1.030     pH 7.5  5.0 - 8.0     Glucose, UA >1000 (*) NEGATIVE (mg/dL)    Hgb urine dipstick NEGATIVE  NEGATIVE     Bilirubin Urine NEGATIVE  NEGATIVE     Ketones, ur 40 (*) NEGATIVE (mg/dL)    Protein, ur 30 (*) NEGATIVE (mg/dL)    Urobilinogen, UA 0.2  0.0 - 1.0 (mg/dL)    Nitrite NEGATIVE  NEGATIVE     Leukocytes, UA NEGATIVE  NEGATIVE    URINE MICROSCOPIC-ADD ON     Status: Normal   Collection Time   09/27/11  3:40 PM      Component Value Range Comment   RBC / HPF 0-2  <3 (RBC/hpf)    Bacteria, UA RARE  RARE    CBC     Status: Normal   Collection Time   09/27/11  3:55 PM      Component Value Range Comment   WBC 8.6  4.0 - 10.5 (K/uL)    RBC 4.90  4.22 - 5.81 (MIL/uL)    Hemoglobin 14.8  13.0 - 17.0 (g/dL)    HCT 40.9  81.1 - 91.4 (%)    MCV 88.0  78.0 - 100.0 (fL)    MCH 30.2  26.0 - 34.0 (pg)    MCHC 34.3  30.0 - 36.0 (g/dL)    RDW 78.2  95.6 - 21.3 (%)    Platelets 244  150 - 400 (K/uL)   DIFFERENTIAL     Status: Abnormal   Collection Time   09/27/11  3:55 PM      Component Value Range Comment   Neutrophils Relative 93 (*) 43 - 77 (%)    Neutro Abs 8.0 (*) 1.7 - 7.7 (K/uL)    Lymphocytes Relative 6 (*) 12 - 46 (%)    Lymphs Abs 0.5 (*) 0.7 - 4.0 (K/uL)    Monocytes Relative 1 (*) 3 - 12 (%)    Monocytes Absolute 0.1  0.1 - 1.0 (K/uL)    Eosinophils Relative 0  0 - 5 (%)      Eosinophils Absolute 0.0  0.0 - 0.7 (K/uL)    Basophils Relative 0  0 - 1 (%)    Basophils Absolute 0.0  0.0 - 0.1 (K/uL)   COMPREHENSIVE METABOLIC PANEL     Status: Abnormal   Collection Time   09/27/11  3:55 PM      Component Value Range Comment   Sodium 132 (*) 135 - 145 (mEq/L)    Potassium 3.8  3.5 - 5.1 (mEq/L)    Chloride 93 (*) 96 - 112 (mEq/L)    CO2 26  19 - 32 (mEq/L)    Glucose, Bld 312 (*) 70 - 99 (mg/dL)    BUN 12  6 - 23 (mg/dL)    Creatinine, Ser 1.61  0.50 - 1.35 (mg/dL)    Calcium 9.5  8.4 - 10.5 (mg/dL)    Total Protein 7.6  6.0 - 8.3 (g/dL)    Albumin 4.1  3.5 - 5.2 (g/dL)    AST 19  0 - 37 (U/L)    ALT 13  0 - 53 (U/L)    Alkaline Phosphatase 72  39 - 117 (U/L)    Total Bilirubin 0.6  0.3 - 1.2 (mg/dL)    GFR calc non Af Amer >90  >90 (mL/min)    GFR calc Af Amer >90  >90 (mL/min)   HEMOGLOBIN A1C     Status: Abnormal   Collection Time   09/27/11  3:55 PM      Component Value Range Comment   Hemoglobin A1C 10.4 (*) <5.7 (%)    Mean Plasma Glucose 252 (*) <117 (mg/dL)   GLUCOSE, CAPILLARY     Status: Abnormal   Collection Time   09/27/11  4:23 PM      Component Value Range Comment   Glucose-Capillary 299 (*) 70 - 99 (mg/dL)   GLUCOSE, CAPILLARY     Status: Abnormal   Collection Time   09/27/11  5:06 PM      Component Value Range Comment   Glucose-Capillary 309 (*) 70 - 99 (mg/dL)   D-DIMER, QUANTITATIVE     Status: Abnormal   Collection Time   09/27/11  7:15 PM      Component Value Range Comment   D-Dimer, Quant 0.70 (*) 0.00 - 0.48 (ug/mL-FEU)   GLUCOSE, CAPILLARY     Status: Abnormal   Collection Time   09/27/11  9:43 PM      Component Value Range Comment   Glucose-Capillary 335 (*) 70 - 99 (mg/dL)   CBC     Status: Abnormal   Collection Time   09/28/11  4:00 AM      Component Value Range Comment   WBC 11.8 (*) 4.0 - 10.5 (K/uL)    RBC 4.47  4.22 - 5.81 (MIL/uL)    Hemoglobin 13.3  13.0 - 17.0 (g/dL)    HCT 09.6 (*) 04.5 - 52.0 (%)    MCV  86.6  78.0 - 100.0 (fL)    MCH 29.8  26.0 - 34.0 (pg)    MCHC 34.4  30.0 - 36.0 (g/dL)    RDW 40.9  81.1 - 91.4 (%)    Platelets 248  150 - 400 (K/uL)   GLUCOSE, CAPILLARY     Status: Abnormal   Collection Time   09/28/11  8:08 AM      Component Value Range Comment   Glucose-Capillary 263 (*) 70 - 99 (mg/dL)   GLUCOSE, CAPILLARY  Status: Abnormal   Collection Time   09/28/11 11:08 AM      Component Value Range Comment   Glucose-Capillary 460 (*) 70 - 99 (mg/dL)   GLUCOSE, CAPILLARY     Status: Abnormal   Collection Time   09/28/11  5:14 PM      Component Value Range Comment   Glucose-Capillary 237 (*) 70 - 99 (mg/dL)   GLUCOSE, CAPILLARY     Status: Abnormal   Collection Time   09/28/11  9:31 PM      Component Value Range Comment   Glucose-Capillary 190 (*) 70 - 99 (mg/dL)   GLUCOSE, CAPILLARY     Status: Abnormal   Collection Time   09/29/11  7:11 AM      Component Value Range Comment   Glucose-Capillary 211 (*) 70 - 99 (mg/dL)   GLUCOSE, CAPILLARY     Status: Abnormal   Collection Time   09/29/11 12:19 PM      Component Value Range Comment   Glucose-Capillary 263 (*) 70 - 99 (mg/dL)      HPI :71 y.o. male presenting with back pain. The history is provided by the patient.  Back Pain . The patient woke up with this pain. He denies any trauma to his back. Denies lifting any heavy weights. The pain radiates into his right buttock and into his right leg. He is noted intermittent swelling of his right lower extremity for the last couple of weeks. Patient had back surgery in January of 2012  Pain is aggravated with bending over and rotation of the lumbar spine.he denied any stool or urinary incontinence. The pain is associated with no known injury. He denies any fever chills or rigors. The quality of the pain is described as shooting and stabbing. Radiates to: right buttock. The pain is at a severity of 10/10. The pain is severe. The pain is the same all the time.  He is also noted  to be significantly hypertensive with systolic blood pressure greater than 200. He states that he has been noncompliant with his antihypertensive medications and has not been able to afford his insulin for     HOSPITAL COURSE:  #1 back pain because of intractable nature he was admitted, started on IV Solu-Medrol, MRI of the back was done with results as above, the patient has been gradually improving. Physical therapy assessed him and he was found to have no neurologic compromise, he will need home health PT and OT. I spoke to Dr. Channing Mutters in [facility], he is recommended a Medrol Dosepak, muscle relaxants, and followup with him in the clinic in about a week.  #2 diabetes hemoglobin A1c was 10.4 he was started on sliding scale insulin and Lantus. He will continue with his Lantus at bedtime. He has been instructed to monitor his CBGs on a regular basis with her home glucometer.  #3 accelerated hypertension patient presented with systolic blood pressure greater than 200. He was treated with clonidine patch lisinopril and Norvasc with significant improvement in his blood pressure.   Discharge Exam:  Blood pressure 180/93, pulse 99, temperature 98.7 F (37.1 C), temperature source Oral, resp. rate 18, height 5\' 11"  (1.803 m), weight 84.823 kg (187 lb), SpO2 96.00%.  Blood pressure 232/119, pulse 89, temperature 97.9 F (36.6 C), temperature source Oral, resp. rate 18, SpO2 93.00%.  Constitutional: He is oriented to person, place, and time. He appears well-developed and well-nourished.  The patient appears very uncomfortable.  Neck: Normal range of motion.  Neck supple.  Cardiovascular: Normal rate, regular rhythm and normal heart sounds.  Pulmonary/Chest: Effort normal and breath sounds normal.  Abdominal: Soft. Bowel sounds are normal. He exhibits no distension.  Musculoskeletal:  There is ttp in the soft tissues of the lumbar spine. The dtr's are trace and equal. Increased paraspinal muscle  spasm . Deep tendon reflexes 2+ bilaterally  Neurological: He is alert and oriented to person, place, and time.  Skin: Skin is warm and dry.  Psychiatric appropriate mood and affect       Discharge Orders    Future Orders Please Complete By Expires   Diet - low sodium heart healthy      Increase activity slowly      Call MD for:  temperature >100.4      Call MD for:  persistant nausea and vomiting         Follow-up Information    Follow up with Tereso Newcomer, PA .         SignedRicharda Overlie 09/29/2011, 3:26 PM

## 2011-09-30 NOTE — Progress Notes (Signed)
Ur completed     

## 2012-01-11 ENCOUNTER — Other Ambulatory Visit: Payer: Self-pay | Admitting: Internal Medicine

## 2012-02-26 ENCOUNTER — Ambulatory Visit (INDEPENDENT_AMBULATORY_CARE_PROVIDER_SITE_OTHER): Payer: Medicare Other | Admitting: Family

## 2012-02-26 ENCOUNTER — Encounter: Payer: Self-pay | Admitting: Family

## 2012-02-26 VITALS — BP 158/94 | HR 94 | Temp 98.5°F | Ht 71.0 in | Wt 218.0 lb

## 2012-02-26 DIAGNOSIS — E119 Type 2 diabetes mellitus without complications: Secondary | ICD-10-CM

## 2012-02-26 DIAGNOSIS — Z860100 Personal history of colon polyps, unspecified: Secondary | ICD-10-CM

## 2012-02-26 DIAGNOSIS — Z23 Encounter for immunization: Secondary | ICD-10-CM

## 2012-02-26 DIAGNOSIS — G629 Polyneuropathy, unspecified: Secondary | ICD-10-CM

## 2012-02-26 DIAGNOSIS — I10 Essential (primary) hypertension: Secondary | ICD-10-CM

## 2012-02-26 DIAGNOSIS — G589 Mononeuropathy, unspecified: Secondary | ICD-10-CM

## 2012-02-26 LAB — CBC WITH DIFFERENTIAL/PLATELET
Basophils Absolute: 0 10*3/uL (ref 0.0–0.1)
Basophils Relative: 0.4 % (ref 0.0–3.0)
Eosinophils Absolute: 0.3 10*3/uL (ref 0.0–0.7)
MCHC: 32.7 g/dL (ref 30.0–36.0)
MCV: 90 fl (ref 78.0–100.0)
Monocytes Absolute: 0.7 10*3/uL (ref 0.1–1.0)
Neutrophils Relative %: 67.3 % (ref 43.0–77.0)
Platelets: 248 10*3/uL (ref 150.0–400.0)
RDW: 13.4 % (ref 11.5–14.6)

## 2012-02-26 LAB — BASIC METABOLIC PANEL
Chloride: 103 mEq/L (ref 96–112)
GFR: 62.21 mL/min (ref >60.00)
Potassium: 3.7 mEq/L (ref 3.5–5.1)
Sodium: 138 mEq/L (ref 135–145)

## 2012-02-26 LAB — LIPID PANEL
Cholesterol: 194 mg/dL (ref 0–200)
HDL: 53.5 mg/dL (ref >39.00)
Triglycerides: 146 mg/dL (ref 0.0–149.0)
VLDL: 29.2 mg/dL (ref 0.0–40.0)

## 2012-02-26 MED ORDER — INSULIN GLARGINE 100 UNIT/ML ~~LOC~~ SOLN: 40.0000 [IU] | Freq: Every day | SUBCUTANEOUS | Status: DC

## 2012-02-26 MED ORDER — LISINOPRIL-HYDROCHLOROTHIAZIDE 20-12.5 MG PO TABS: 1.0000 | ORAL_TABLET | Freq: Every day | ORAL | Status: DC

## 2012-02-26 NOTE — Patient Instructions (Addendum)
Diabetes and Exercise Regular exercise is important and can help:   Control blood glucose (sugar).   Decrease blood pressure.    Control blood lipids (cholesterol, triglycerides).   Improve overall health.  BENEFITS FROM EXERCISE  Improved fitness.   Improved flexibility.   Improved endurance.   Increased bone density.   Weight control.   Increased muscle strength.   Decreased body fat.   Improvement of the body's use of insulin, a hormone.   Increased insulin sensitivity.   Reduction of insulin needs.   Reduced stress and tension.   Helps you feel better.  People with diabetes who add exercise to their lifestyle gain additional benefits, including:  Weight loss.   Reduced appetite.   Improvement of the body's use of blood glucose.   Decreased risk factors for heart disease:   Lowering of cholesterol and triglycerides.   Raising the level of good cholesterol (high-density lipoproteins, HDL).   Lowering blood sugar.   Decreased blood pressure.  TYPE 1 DIABETES AND EXERCISE  Exercise will usually lower your blood glucose.   If blood glucose is greater than 240 mg/dl, check urine ketones. If ketones are present, do not exercise.   Location of the insulin injection sites may need to be adjusted with exercise. Avoid injecting insulin into areas of the body that will be exercised. For example, avoid injecting insulin into:   The arms when playing tennis.   The legs when jogging. For more information, discuss this with your caregiver.   Keep a record of:   Food intake.   Type and amount of exercise.   Expected peak times of insulin action.   Blood glucose levels.  Do this before, during, and after exercise. Review your records with your caregiver. This will help you to develop guidelines for adjusting food intake and insulin amounts.  TYPE 2 DIABETES AND EXERCISE  Regular physical activity can help control blood glucose.   Exercise is important  because it may:   Increase the body's sensitivity to insulin.   Improve blood glucose control.   Exercise reduces the risk of heart disease. It decreases serum cholesterol and triglycerides. It also lowers blood pressure.   Those who take insulin or oral hypoglycemic agents should watch for signs of hypoglycemia. These signs include dizziness, shaking, sweating, chills, and confusion.   Body water is lost during exercise. It must be replaced. This will help to avoid loss of body fluids (dehydration) or heat stroke.  Be sure to talk to your caregiver before starting an exercise program to make sure it is safe for you. Remember, any activity is better than none.  Document Released: 08/22/2003 Document Revised: 05/21/2011 Document Reviewed: 12/06/2008 [employer] Patient Information 2012 [employer].   Diabetes Meal Planning Guide The diabetes meal planning guide is a tool to help you plan your meals and snacks. It is important for people with diabetes to manage their blood glucose (sugar) levels. Choosing the right foods and the right amounts throughout your day will help control your blood glucose. Eating right can even help you improve your blood pressure and reach or maintain a healthy weight. CARBOHYDRATE COUNTING MADE EASY When you eat carbohydrates, they turn to sugar. This raises your blood glucose level. Counting carbohydrates can help you control this level so you feel better. When you plan your meals by counting carbohydrates, you can have more flexibility in what you eat and balance your medicine with your food intake. Carbohydrate counting simply means adding up the total   amount of carbohydrate grams in your meals and snacks. Try to eat about the same amount at each meal. Foods with carbohydrates are listed below. Each portion below is 1 carbohydrate serving or 15 grams of carbohydrates. Ask your dietician how many grams of carbohydrates you should eat at each meal or snack. Grains  and Starches 1 slice bread.   English muffin or hotdog/hamburger bun.   cup cold cereal (unsweetened).  ? cup cooked pasta or rice.   cup starchy vegetables (corn, potatoes, peas, beans, winter squash).  1 tortilla (6 inches).   bagel.  1 waffle or pancake (size of a CD).   cup cooked cereal.  4 to 6 small crackers.  *Whole grain is recommended. Fruit 1 cup fresh unsweetened berries, melon, papaya, pineapple.  1 small fresh fruit.   banana or mango.   cup fruit juice (4 oz unsweetened).   cup canned fruit in natural juice or water.  2 tbs dried fruit.  12 to 15 grapes or cherries.  Milk and Yogurt 1 cup fat-free or 1% milk.  1 cup soy milk.  6 oz light yogurt with sugar-free sweetener.  6 oz low-fat soy yogurt.  6 oz plain yogurt.  Vegetables 1 cup raw or  cup cooked is counted as 0 carbohydrates or a "free" food.  If you eat 3 or more servings at 1 meal, count them as 1 carbohydrate serving.  Other Carbohydrates  oz chips or pretzels.   cup ice cream or frozen yogurt.   cup sherbet or sorbet.  2 inch square cake, no frosting.  1 tbs honey, sugar, jam, jelly, or syrup.  2 small cookies.  3 squares of graham crackers.  3 cups popcorn.  6 crackers.  1 cup broth-based soup.  Count 1 cup casserole or other mixed foods as 2 carbohydrate servings.  Foods with less than 20 calories in a serving may be counted as 0 carbohydrates or a "free" food.  You may want to purchase a book or computer software that lists the carbohydrate gram counts of different foods. In addition, the nutrition facts panel on the labels of the foods you eat are a good source of this information. The label will tell you how big the serving size is and the total number of carbohydrate grams you will be eating per serving. Divide this number by 15 to obtain the number of carbohydrate servings in a portion. Remember, 1 carbohydrate serving equals 15 grams of carbohydrate. SERVING SIZES Measuring  foods and serving sizes helps you make sure you are getting the right amount of food. The list below tells how big or small some common serving sizes are. 1 oz.........4 stacked dice.  3 oz.........Deck of cards.  1 tsp........Tip of little finger.  1 tbs........Thumb.  2 tbs........Golf ball.   cup.......Half of a fist.  1 cup........A fist.  SAMPLE DIABETES MEAL PLAN Below is a sample meal plan that includes foods from the grain and starches, dairy, vegetable, fruit, and meat groups. A dietician can individualize a meal plan to fit your calorie needs and tell you the number of servings needed from each food group. However, controlling the total amount of carbohydrates in your meal or snack is more important than making sure you include all of the food groups at every meal. You may interchange carbohydrate containing foods (dairy, starches, and fruits). The meal plan below is an example of a 2000 calorie diet using carbohydrate counting. This meal plan has 17 carbohydrate servings. Breakfast 1   cup oatmeal (2 carb servings).   cup light yogurt (1 carb serving).  1 cup blueberries (1 carb serving).   cup almonds.  Snack 1 large apple (2 carb servings).  1 low-fat string cheese stick.  Lunch Chicken breast salad.  1 cup spinach.   cup chopped tomatoes.  2 oz chicken breast, sliced.  2 tbs low-fat Italian dressing.  12 whole-wheat crackers (2 carb servings).  12 to 15 grapes (1 carb serving).  1 cup low-fat milk (1 carb serving).  Snack 1 cup carrots.   cup hummus (1 carb serving).  Dinner 3 oz broiled salmon.  1 cup brown rice (3 carb servings).  Snack 1  cups steamed broccoli (1 carb serving) drizzled with 1 tsp olive oil and lemon juice.  1 cup light pudding (2 carb servings).  DIABETES MEAL PLANNING WORKSHEET Your dietician can use this worksheet to help you decide how many servings of foods and what types of foods are right for you.  BREAKFAST Food Group and Servings /  Carb Servings Grain/Starches __________________________________ Dairy __________________________________________ Vegetable ______________________________________ Fruit ___________________________________________ Meat __________________________________________ Fat ____________________________________________ LUNCH Food Group and Servings / Carb Servings Grain/Starches ___________________________________ Dairy ___________________________________________ Fruit ____________________________________________ Meat ___________________________________________ Fat _____________________________________________ DINNER Food Group and Servings / Carb Servings Grain/Starches ___________________________________ Dairy ___________________________________________ Fruit ____________________________________________ Meat ___________________________________________ Fat _____________________________________________ SNACKS Food Group and Servings / Carb Servings Grain/Starches ___________________________________ Dairy ___________________________________________ Vegetable _______________________________________ Fruit ____________________________________________ Meat ___________________________________________ Fat _____________________________________________ DAILY TOTALS Starches _________________________ Vegetable ________________________ Fruit ____________________________ Dairy ____________________________ Meat ____________________________ Fat ______________________________ Document Released: 02/26/2005 Document Revised: 05/21/2011 Document Reviewed: 01/07/2009 [employer] Patient Information 2012 [employer].    

## 2012-02-26 NOTE — Progress Notes (Signed)
Subjective:    Patient ID: Donald Calderon, male    DOB: 1940/12/05, 71 y.o.   MRN: [id number]  HPI 71 year old new patient to the practice in in today to be established. He has a history of Type 2 diabetes, Hypertension, hyperlipidemia, Neuropathy, and colon polys 9 years ago. [facility] He was last seen in August 2013. Patient has c/o edema in his lower extremities from time to time. He says he was prescribed Neurontin to help with the swelling and pain that has been ineffective.    Review of Systems  Constitutional: Negative.   HENT: Negative.   Respiratory: Negative.   Cardiovascular: Negative.   Gastrointestinal: Negative.   Genitourinary: Negative.   Musculoskeletal: Negative.   Skin: Negative.   Neurological: Negative.   Hematological: Negative.   Psychiatric/Behavioral: Negative.    Past Medical History  Diagnosis Date  . Diabetes mellitus   . Hypertension   . Pneumonia   . Sleep apnea   . GERD (gastroesophageal reflux disease)   . Headache   . Arthritis   . Depression     History   Social History  . Marital Status: Married    Spouse Name: N/A    Number of Children: N/A  . Years of Education: N/A   Occupational History  . Not on file.   Social History Main Topics  . Smoking status: Former Games developer  . Smokeless tobacco: Former Neurosurgeon    Quit date: 09/26/1997  . Alcohol Use: 2.4 oz/week    4 Cans of beer per week  . Drug Use: No  . Sexually Active:    Other Topics Concern  . Not on file   Social History Narrative  . No narrative on file    Past Surgical History  Procedure Date  . Back surgery     No family history on file.  Allergies  Allergen Reactions  . Penicillins Rash    Current Outpatient Prescriptions on File Prior to Visit  Medication Sig Dispense Refill  . amLODipine (NORVASC) 5 MG tablet Take 2 tablets (10 mg total) by mouth daily.  30 tablet  2  . gabapentin (NEURONTIN) 300 MG capsule Take 300 mg by  mouth 3 (three) times daily.      . Liniments (BEN GAY EX) Apply 1 application topically 2 (two) times daily as needed. For pain      . DISCONTD: insulin glargine (LANTUS) 100 UNIT/ML injection Inject 40 Units into the skin at bedtime.  1000 mL  2  . DISCONTD: lisinopril (PRINIVIL,ZESTRIL) 20 MG tablet Take 1 tablet (20 mg total) by mouth daily.  30 tablet  2  . cloNIDine (CATAPRES - DOSED IN MG/24 HR) 0.3 mg/24hr Place 1 patch (0.3 mg total) onto the skin once a week.  10 patch  2  . lisinopril-hydrochlorothiazide (ZESTORETIC) 20-12.5 MG per tablet Take 1 tablet by mouth daily.  30 tablet  3  . naproxen sodium (ANAPROX) 220 MG tablet Take 660 mg by mouth 2 (two) times daily as needed. For pain        BP 158/94  Pulse 94  Temp 98.5 F (36.9 C) (Oral)  Ht 5\' 11"  (1.803 m)  Wt 218 lb (98.884 kg)  BMI 30.40 kg/m2  SpO2 96%chart    Objective:   Physical Exam  Constitutional: He is oriented to person, place, and time. He appears well-developed and well-nourished.  HENT:  Right Ear: External ear normal.  Left Ear: External ear normal.  Nose: Nose normal.  Mouth/Throat: Oropharynx is clear and moist.  Eyes: Conjunctivae normal are normal. Pupils are equal, round, and reactive to light.  Neck: Normal range of motion. Neck supple.  Cardiovascular: Normal rate, regular rhythm and normal heart sounds.   Pulmonary/Chest: Effort normal and breath sounds normal.  Musculoskeletal: Normal range of motion.  Neurological: He is alert and oriented to person, place, and time.       Monofilament intact. Feet skin intact.  Skin: Skin is warm and dry.  Psychiatric: He has a normal mood and affect.          Assessment & Plan:  Assessment: Type 2 diabetes, Hyperlipidemia, Hypertension, Neuropathy  Plan: Increase lisinopril 20mg  to lisinopril hct 20/12.5 once daily.Labs sent. Will notify patient pending results. Follow-up in 3 months and sooner as needed. Refer to GI for colonoscopy. Refer for  diabetic eye exam.

## 2012-03-14 ENCOUNTER — Encounter (INDEPENDENT_AMBULATORY_CARE_PROVIDER_SITE_OTHER): Payer: Medicare Other | Admitting: Ophthalmology

## 2012-03-14 DIAGNOSIS — E11319 Type 2 diabetes mellitus with unspecified diabetic retinopathy without macular edema: Secondary | ICD-10-CM

## 2012-03-14 DIAGNOSIS — I10 Essential (primary) hypertension: Secondary | ICD-10-CM

## 2012-03-14 DIAGNOSIS — E1139 Type 2 diabetes mellitus with other diabetic ophthalmic complication: Secondary | ICD-10-CM

## 2012-03-14 DIAGNOSIS — H43819 Vitreous degeneration, unspecified eye: Secondary | ICD-10-CM

## 2012-03-14 DIAGNOSIS — H35039 Hypertensive retinopathy, unspecified eye: Secondary | ICD-10-CM

## 2012-03-16 ENCOUNTER — Telehealth: Payer: Self-pay | Admitting: Family

## 2012-03-16 NOTE — Telephone Encounter (Signed)
Received 2 pages from [facility]. 03/16/12/sd

## 2012-05-27 ENCOUNTER — Ambulatory Visit (INDEPENDENT_AMBULATORY_CARE_PROVIDER_SITE_OTHER): Payer: Medicare Other | Admitting: Family

## 2012-05-27 ENCOUNTER — Encounter: Payer: Self-pay | Admitting: Family

## 2012-05-27 VITALS — BP 124/78 | HR 83 | Temp 98.3°F | Wt 218.0 lb

## 2012-05-27 DIAGNOSIS — E119 Type 2 diabetes mellitus without complications: Secondary | ICD-10-CM

## 2012-05-27 DIAGNOSIS — IMO0002 Reserved for concepts with insufficient information to code with codable children: Secondary | ICD-10-CM

## 2012-05-27 DIAGNOSIS — M549 Dorsalgia, unspecified: Secondary | ICD-10-CM

## 2012-05-27 DIAGNOSIS — G8929 Other chronic pain: Secondary | ICD-10-CM

## 2012-05-27 DIAGNOSIS — M792 Neuralgia and neuritis, unspecified: Secondary | ICD-10-CM

## 2012-05-27 DIAGNOSIS — I10 Essential (primary) hypertension: Secondary | ICD-10-CM

## 2012-05-27 LAB — HEPATIC FUNCTION PANEL
AST: 26 U/L (ref 0–37)
Albumin: 4.2 g/dL (ref 3.5–5.2)
Alkaline Phosphatase: 49 U/L (ref 39–117)
Total Bilirubin: 0.9 mg/dL (ref 0.3–1.2)

## 2012-05-27 LAB — BASIC METABOLIC PANEL
GFR: 86.64 mL/min (ref >60.00)
Glucose, Bld: 79 mg/dL (ref 70–99)
Potassium: 3.6 mEq/L (ref 3.5–5.1)
Sodium: 131 mEq/L — ABNORMAL LOW (ref 135–145)

## 2012-05-27 LAB — LIPID PANEL: VLDL: 17.4 mg/dL (ref 0.0–40.0)

## 2012-05-27 LAB — HEMOGLOBIN A1C: Hgb A1c MFr Bld: 6.7 % — ABNORMAL HIGH (ref 4.6–6.5)

## 2012-05-27 MED ORDER — "INSULIN SYRINGE 28G X 1/2"" 0.5 ML MISC": 40.0000 [IU] | Freq: Every day | Status: DC

## 2012-05-27 MED ORDER — CYCLOBENZAPRINE HCL 10 MG PO TABS: 10.0000 mg | ORAL_TABLET | Freq: Every day | ORAL | Status: DC

## 2012-05-27 MED ORDER — LISINOPRIL-HYDROCHLOROTHIAZIDE 20-12.5 MG PO TABS: 1.0000 | ORAL_TABLET | Freq: Every day | ORAL | Status: DC

## 2012-05-27 MED ORDER — AMLODIPINE BESYLATE 10 MG PO TABS: 10.0000 mg | ORAL_TABLET | Freq: Every day | ORAL | Status: DC

## 2012-05-27 MED ORDER — GLUCOSE BLOOD VI STRP: ORAL_STRIP | Status: DC

## 2012-05-27 MED ORDER — AMLODIPINE BESYLATE 5 MG PO TABS: 10.0000 mg | ORAL_TABLET | Freq: Every day | ORAL | Status: DC

## 2012-05-27 MED ORDER — GABAPENTIN 400 MG PO CAPS: 400.0000 mg | ORAL_CAPSULE | Freq: Three times a day (TID) | ORAL | Status: DC

## 2012-05-27 MED ORDER — INSULIN GLARGINE 100 UNIT/ML ~~LOC~~ SOLN: 40.0000 [IU] | Freq: Every day | SUBCUTANEOUS | Status: DC

## 2012-05-27 NOTE — Progress Notes (Signed)
Subjective:    Patient ID: Donald Calderon, male    DOB: [birth year], 71 y.o.   MRN: [id number]  HPI 71 year old African American male, nonsmoker is in for recheck of type 2 diabetes, peripheral neuropathy, hypertension. Patient reports doing well overall. He is tolerating his medications well. He has had an increase in numbness and tingling in his feet over the last 3 months, particularly at night. He is currently taken Neurontin 300 mg 3 times a day that helps if symptoms. He does not routinely check his blood sugars. Shot follow healthy diet. Not exercising.    Review of Systems  Constitutional: Negative.   HENT: Negative.   Respiratory: Negative.   Cardiovascular: Negative.   Gastrointestinal: Negative.   Genitourinary: Negative.   Musculoskeletal: Negative.   Skin: Negative.   Neurological: Negative.   Hematological: Negative.    Past Medical History  Diagnosis Date  . Diabetes mellitus   . Hypertension   . Pneumonia   . Sleep apnea   . GERD (gastroesophageal reflux disease)   . Headache   . Arthritis   . Depression     History   Social History  . Marital Status: Married    Spouse Name: N/A    Number of Children: N/A  . Years of Education: N/A   Occupational History  . Not on file.   Social History Main Topics  . Smoking status: Former Games developer  . Smokeless tobacco: Former Neurosurgeon    Quit date: 09/26/1997  . Alcohol Use: 2.4 oz/week    4 Cans of beer per week  . Drug Use: No  . Sexually Active:    Other Topics Concern  . Not on file   Social History Narrative  . No narrative on file    Past Surgical History  Procedure Date  . Back surgery     No family history on file.  Allergies  Allergen Reactions  . Penicillins Rash    Current Outpatient Prescriptions on File Prior to Visit  Medication Sig Dispense Refill  . amLODipine (NORVASC) 10 MG tablet Take 1 tablet (10 mg total) by mouth daily.  30 tablet  3  . gabapentin (NEURONTIN) 400 MG capsule  Take 1 capsule (400 mg total) by mouth 3 (three) times daily.  90 capsule  3  . insulin glargine (LANTUS) 100 UNIT/ML injection Inject 40 Units into the skin at bedtime.  1000 mL  3  . Liniments (BEN GAY EX) Apply 1 application topically 2 (two) times daily as needed. For pain      . lisinopril-hydrochlorothiazide (ZESTORETIC) 20-12.5 MG per tablet Take 1 tablet by mouth daily.  30 tablet  3  . naproxen sodium (ANAPROX) 220 MG tablet Take 660 mg by mouth 2 (two) times daily as needed. For pain      . cloNIDine (CATAPRES - DOSED IN MG/24 HR) 0.3 mg/24hr Place 1 patch (0.3 mg total) onto the skin once a week.  10 patch  2  . lisinopril (PRINIVIL,ZESTRIL) 20 MG tablet Take 40 mg by mouth daily.        BP 124/78  Pulse 83  Temp 98.3 F (36.8 C) (Oral)  Wt 218 lb (98.884 kg)  SpO2 94%chart    Objective:   Physical Exam  Constitutional: He is oriented to person, place, and time. He appears well-developed and well-nourished.  HENT:  Right Ear: External ear normal.  Left Ear: External ear normal.  Nose: Nose normal.  Mouth/Throat: Oropharynx is clear and moist.  Eyes:  Conjunctivae normal and EOM are normal. Pupils are equal, round, and reactive to light.  Neck: Normal range of motion. Neck supple.  Cardiovascular: Normal rate, regular rhythm and normal heart sounds.   Pulmonary/Chest: Effort normal and breath sounds normal.  Abdominal: Soft. Bowel sounds are normal.  Musculoskeletal: Normal range of motion.  Neurological: He is alert and oriented to person, place, and time. No cranial nerve deficit. Coordination normal.       Monofilament intact. Feet skin intact.  Skin: Skin is warm and dry.  Psychiatric: He has a normal mood and affect.          Assessment & Plan:  Assessment: Type 2 diabetes, peripheral neuropathy, hypertension  Plan: Lab sent to include BMP, CBC, lipids, A1c, LFTs will notify patient pending results. Increase Neurontin to 400 mg 3 times a day and to help  neuropathy. Encouraged healthy diet and exercise. Encouraged him to check his fasting blood sugars daily and postprandial readings. Patient to call the office with any questions or concerns. Recheck in 3 months, and sooner as needed.

## 2012-05-27 NOTE — Patient Instructions (Addendum)
Neuropathy Neuropathy means your peripheral nerves are not working normally. Peripheral nerves are the nerves outside the brain and spinal cord. Messages between the brain and the rest of the body do not work properly with peripheral nerve disorders. CAUSES There are many different causes of peripheral nerve disorders. These include:  Injury.   Infections.   Diabetes.   Vitamin deficiency.   Poor circulation.   Alcoholism.   Exposure to toxins.   Drug effects.   Tumors.   Kidney disease.  SYMPTOMS  Tingling, burning, pain, and numbness in the extremities.   Weakness and loss of muscle tone and size.  DIAGNOSIS Blood tests and special studies of nerve function may help confirm the diagnosis.  TREATMENT  Treatment includes adopting healthy life habits.   A good diet, vitamin supplements, and mild pain medicine may be needed.   Avoid known toxins such as alcohol, tobacco, and recreational drugs.   Anti-convulsant medicines are helpful in some types of neuropathy.  Make a follow-up appointment with your caregiver to be sure you are getting better with treatment.  SEEK IMMEDIATE MEDICAL CARE IF:   You have breathing problems.   You have severe or uncontrolled pain.   You notice extreme weakness or you feel faint.   You are not better after 1 week or if you have worse symptoms.  Document Released: 07/09/2004 Document Revised: 02/11/2011 Document Reviewed: 06/01/2005 Exit[employer] Patient Information 2012 [employer]. 

## 2012-05-30 ENCOUNTER — Other Ambulatory Visit: Payer: Self-pay

## 2012-05-30 MED ORDER — GLUCOSE BLOOD VI STRP: ORAL_STRIP | Status: DC

## 2012-06-02 ENCOUNTER — Telehealth: Payer: Self-pay | Admitting: Family

## 2012-06-02 MED ORDER — GLUCOSE BLOOD VI STRP: ORAL_STRIP | Status: DC

## 2012-06-02 NOTE — Telephone Encounter (Signed)
Rx resent with dx code attached

## 2012-06-02 NOTE — Telephone Encounter (Signed)
Patient called stating that they need a call back with clarification for the patient's test strips. Please assist.

## 2012-06-03 ENCOUNTER — Telehealth: Payer: Self-pay | Admitting: Family

## 2012-06-03 MED ORDER — GLUCOSE BLOOD VI STRP: ORAL_STRIP | Status: DC

## 2012-06-03 NOTE — Telephone Encounter (Signed)
Donald Calderon - pt called back. States [facility] says they still do not have the new rx. Please re-fax w/codes tp [facility] again.

## 2012-06-03 NOTE — Addendum Note (Signed)
Addended by: Beverely Low on: 06/03/2012 01:21 PM   Modules accepted: Orders

## 2012-07-18 ENCOUNTER — Telehealth: Payer: Self-pay | Admitting: Family

## 2012-07-18 MED ORDER — INSULIN GLARGINE 100 UNIT/ML ~~LOC~~ SOLN: 40.0000 [IU] | Freq: Every day | SUBCUTANEOUS | Status: DC

## 2012-07-18 NOTE — Telephone Encounter (Signed)
Patient has no refills left on his insulin glargine (LANTUS) 100 UNIT/ML injection Requesting rx be sent to [facility]. He has enough for today, but none for tomorrow.

## 2012-08-26 ENCOUNTER — Ambulatory Visit: Payer: Medicare Other | Admitting: Family

## 2012-09-02 ENCOUNTER — Ambulatory Visit: Payer: Medicare Other | Admitting: Family

## 2012-09-02 DIAGNOSIS — Z0289 Encounter for other administrative examinations: Secondary | ICD-10-CM

## 2013-01-17 NOTE — Telephone Encounter (Signed)
Close encounter 

## 2013-03-15 ENCOUNTER — Ambulatory Visit (INDEPENDENT_AMBULATORY_CARE_PROVIDER_SITE_OTHER): Payer: Medicare Other | Admitting: Ophthalmology

## 2013-05-17 ENCOUNTER — Encounter (INDEPENDENT_AMBULATORY_CARE_PROVIDER_SITE_OTHER): Payer: Self-pay | Admitting: Ophthalmology

## 2015-09-14 ENCOUNTER — Emergency Department (HOSPITAL_COMMUNITY)
Admission: EM | Admit: 2015-09-14 | Discharge: 2015-09-14 | Disposition: A | Discharge status: Home / Self Care | Payer: Medicare HMO | Attending: Emergency Medicine | Admitting: Emergency Medicine

## 2015-09-14 ENCOUNTER — Encounter (HOSPITAL_COMMUNITY): Payer: Self-pay | Admitting: *Deleted

## 2015-09-14 DIAGNOSIS — Z79899 Other long term (current) drug therapy: Secondary | ICD-10-CM | POA: Diagnosis not present

## 2015-09-14 DIAGNOSIS — Z880 Allergy status to penicillin: Secondary | ICD-10-CM | POA: Insufficient documentation

## 2015-09-14 DIAGNOSIS — F329 Major depressive disorder, single episode, unspecified: Secondary | ICD-10-CM | POA: Diagnosis not present

## 2015-09-14 DIAGNOSIS — I10 Essential (primary) hypertension: Secondary | ICD-10-CM | POA: Insufficient documentation

## 2015-09-14 DIAGNOSIS — Z87891 Personal history of nicotine dependence: Secondary | ICD-10-CM | POA: Diagnosis not present

## 2015-09-14 DIAGNOSIS — Z8701 Personal history of pneumonia (recurrent): Secondary | ICD-10-CM | POA: Diagnosis not present

## 2015-09-14 DIAGNOSIS — Z794 Long term (current) use of insulin: Secondary | ICD-10-CM | POA: Insufficient documentation

## 2015-09-14 DIAGNOSIS — M1990 Unspecified osteoarthritis, unspecified site: Secondary | ICD-10-CM | POA: Insufficient documentation

## 2015-09-14 DIAGNOSIS — E119 Type 2 diabetes mellitus without complications: Secondary | ICD-10-CM | POA: Insufficient documentation

## 2015-09-14 DIAGNOSIS — Z8719 Personal history of other diseases of the digestive system: Secondary | ICD-10-CM | POA: Diagnosis not present

## 2015-09-14 DIAGNOSIS — Z8669 Personal history of other diseases of the nervous system and sense organs: Secondary | ICD-10-CM | POA: Insufficient documentation

## 2015-09-14 DIAGNOSIS — R319 Hematuria, unspecified: Secondary | ICD-10-CM | POA: Diagnosis present

## 2015-09-14 LAB — URINALYSIS, ROUTINE W REFLEX MICROSCOPIC
Bilirubin Urine: NEGATIVE
GLUCOSE, UA: 100 mg/dL — AB
KETONES UR: NEGATIVE mg/dL
NITRITE: NEGATIVE
PH: 7.5 (ref 5.0–8.0)
PROTEIN: NEGATIVE mg/dL
Specific Gravity, Urine: 1.013 (ref 1.005–1.030)

## 2015-09-14 LAB — URINE MICROSCOPIC-ADD ON

## 2015-09-14 NOTE — Discharge Instructions (Signed)

## 2015-09-14 NOTE — ED Provider Notes (Signed)
CSN: [id number]     Arrival date & time 09/14/15  1632 History   First MD Initiated Contact with Patient 09/14/15 1649     Chief Complaint  Patient presents with  . Hematuria     (Consider location/radiation/quality/duration/timing/severity/associated sxs/prior Treatment) Patient is a 75 y.o. male presenting with hematuria. The history is provided by the patient and medical records.  Hematuria   75 year old male with history of diabetes, hypertension, sleep apnea, GERD, arthritis, depression, presenting to the ED for hematuria and difficulty urinating.  Patient states symptoms began last night. Initially he was straining to urinate and was passing small blood clots. He states he passed a rather large blood clot last night and has been urinating freely since this time but continues to have hematuria. He denies any dysuria, urinary frequency, abdominal pain, flank pain, or fever. Patient states he had similar symptoms in the past after his TURP procedure in 2012 by Dr. Jeffie Pollock.  He states he has not had consistent follow-up surgery.  Patient denies any anticoagulant use or heavy ASA use.  VSS.  Past Medical History  Diagnosis Date  . Diabetes mellitus   . Hypertension   . Pneumonia   . Sleep apnea   . GERD (gastroesophageal reflux disease)   . Headache(784.0)   . Arthritis   . Depression    Past Surgical History  Procedure Laterality Date  . Back surgery     History reviewed. No pertinent family history. Social History  Substance Use Topics  . Smoking status: Former Research scientist (life sciences)  . Smokeless tobacco: Former Systems developer    Quit date: 09/26/1997  . Alcohol Use: 2.4 oz/week    4 Cans of beer per week    Review of Systems  Genitourinary: Positive for hematuria.  All other systems reviewed and are negative.     Allergies  Penicillins  Home Medications   Prior to Admission medications   Medication Sig Start Date End Date Taking? Authorizing Provider  amLODipine (NORVASC) 10 MG tablet  Take 1 tablet (10 mg total) by mouth daily. 05/27/12 09/14/15 Yes Kennyth Arnold, FNP  dorzolamide-timolol (COSOPT) 22.3-6.8 MG/ML ophthalmic solution Place 1 drop into both eyes 2 (two) times daily. 07/04/15  Yes Historical Provider, MD  gabapentin (NEURONTIN) 400 MG capsule Take 1 capsule (400 mg total) by mouth 3 (three) times daily. Patient taking differently: Take 800 mg by mouth 3 (three) times daily.  05/27/12  Yes Kennyth Arnold, FNP  latanoprost (XALATAN) 0.005 % ophthalmic solution Place 1 drop into both eyes at bedtime. 08/29/15  Yes Historical Provider, MD  lisinopril-hydrochlorothiazide (ZESTORETIC) 20-12.5 MG per tablet Take 1 tablet by mouth daily. 05/27/12 09/14/15 Yes Kennyth Arnold, FNP  cloNIDine (CATAPRES - DOSED IN MG/24 HR) 0.3 mg/24hr Place 1 patch (0.3 mg total) onto the skin once a week. 09/29/11 09/28/12  Reyne Dumas, MD  cyclobenzaprine (FLEXERIL) 10 MG tablet Take 1 tablet (10 mg total) by mouth daily. 05/27/12   Kennyth Arnold, FNP  glucose blood (BAYER CONTOUR NEXT TEST) test strip Use as instructed 06/03/12   Kennyth Arnold, FNP  insulin glargine (LANTUS) 100 UNIT/ML injection Inject 40 Units into the skin at bedtime. 07/18/12 07/18/13  Kennyth Arnold, FNP  INSULIN SYRINGE .5CC/28G 28G X 1/2" 0.5 ML MISC 40 Units by Does not apply route at bedtime. 05/27/12   Kennyth Arnold, FNP  LEVEMIR 100 UNIT/ML injection Inject 10-30 Units into the skin 2 (two) times daily. 10 units every morning, and 30 units  every evening 08/29/15   Historical Provider, MD  Liniments (BEN GAY EX) Apply 1 application topically 2 (two) times daily as needed. For pain    Historical Provider, MD  lisinopril (PRINIVIL,ZESTRIL) 20 MG tablet Take 40 mg by mouth daily. 09/29/11 09/28/12  Reyne Dumas, MD  naproxen sodium (ANAPROX) 220 MG tablet Take 660 mg by mouth 2 (two) times daily as needed. For pain    Historical Provider, MD   BP 135/74 mmHg  Pulse 85  Temp(Src) 99.1 F (37.3 C) (Oral)  Resp 18  SpO2 96%    Physical Exam  Constitutional: He is oriented to person, place, and time. He appears well-developed and well-nourished. No distress.  HENT:  Head: Normocephalic and atraumatic.  Mouth/Throat: Oropharynx is clear and moist.  Eyes: Conjunctivae and EOM are normal. Pupils are equal, round, and reactive to light.  Neck: Normal range of motion. Neck supple.  Cardiovascular: Normal rate, regular rhythm and normal heart sounds.   Pulmonary/Chest: Effort normal and breath sounds normal. No respiratory distress. He has no wheezes.  Abdominal: Soft. Bowel sounds are normal. There is no tenderness. There is no guarding and no CVA tenderness.  Abdomen soft, non-tender, no CVA tenderness  Musculoskeletal: Normal range of motion. He exhibits no edema.  Neurological: He is alert and oriented to person, place, and time.  Skin: Skin is warm and dry. He is not diaphoretic.  Psychiatric: He has a normal mood and affect.  Nursing note and vitals reviewed.   ED Course  Procedures (including critical care time) Labs Review Labs Reviewed  URINALYSIS, ROUTINE W REFLEX MICROSCOPIC (NOT AT [facility]) - Abnormal; Notable for the following:    Color, Urine RED (*)    APPearance CLOUDY (*)    Glucose, UA 100 (*)    Hgb urine dipstick LARGE (*)    Leukocytes, UA TRACE (*)    All other components within normal limits  URINE MICROSCOPIC-ADD ON - Abnormal; Notable for the following:    Squamous Epithelial / LPF 0-5 (*)    Bacteria, UA RARE (*)    All other components within normal limits  URINE CULTURE    Imaging Review No results found. I have personally reviewed and evaluated these images and lab results as part of my medical decision-making.   EKG Interpretation None      MDM   Final diagnoses:  Hematuria   75 year old male here with hematuria. Some difficulty voiding yesterday, improved after passing moderate sized blood clot yesterday evening. Patient is afebrile, nontoxic. Abdominal exam is  benign, no CVA tenderness. No complaint of dysuria, abdominal pain, flank pain, or fever.  History of TURP procedure in 2012 with Dr. Jeffie Pollock, no close follow-up since this time. Patient's UA with large blood, rare bacteria. We'll send for culture. Patient has no other complaints at this time, he is nontoxic in appearance. Recommended close follow-up with urology.  Discussed plan with patient, he acknowledged understanding and agreed with plan of care.  Return precautions given for new or worsening symptoms.  Case discussed with attending physician, Dr. Tyrone Nine, who evaluated patient and agrees with assessment and plan of care.  Larene Pickett, PA-C 09/14/15 [place], DO 09/14/15 2155

## 2015-09-14 NOTE — ED Notes (Addendum)
Pt reports onset yesterday of difficulty urinating due to passing blood clots. Denies any pain, denies fever. No acute distress noted at triage.

## 2015-09-16 LAB — URINE CULTURE

## 2015-10-07 NOTE — Progress Notes (Addendum)
Patient ID: Donald Calderon, male   DOB: 1940/12/02, 75 y.o.   MRN: [id number]     Cardiology Office Note   Date:  10/08/2015   ID:  Donald Calderon, DOB Sep 12, 1940, MRN [id number]  PCP:  [name], NP  Cardiologist:   Jenkins Rouge, MD   Chief Complaint  Patient presents with  . Establish Care      History of Present Illness: Donald Calderon is a 75 y.o. male who presents for evaluation of aortic aneurysm  CRF;s HTN , DM and elevated lipids.  Seen by Dr Louis Meckel for hematuria.  Post TURP by Dr Jeffie Pollock 2012 Apparently CT scan done. I have not been able to get images.  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  He has no history of vascular disease. No chest pain. BP well controlled. Long standing Insulin dependent diabetic. Activity limited by left knee and hip arthritis Will likely need surgery for this. No trauma, history of syphilis , murmur or bicuspid AV.  Compliant with meds .     Past Medical History  Diagnosis Date  . Diabetes mellitus   . Hypertension   . Pneumonia   . Sleep apnea   . GERD (gastroesophageal reflux disease)   . Headache(784.0)   . Arthritis   . Depression     Past Surgical History  Procedure Laterality Date  . Back surgery       Current Outpatient Prescriptions  Medication Sig Dispense Refill  . amLODipine (NORVASC) 10 MG tablet Take 10 mg by mouth daily.    [name] gabapentin (NEURONTIN) 800 MG tablet Take 800 mg by mouth 3 (three) times daily.    [name] LEVEMIR 100 UNIT/ML injection Inject 10-30 Units into the skin 2 (two) times daily. 10 units every morning, and 30 units every evening  2  . lisinopril (PRINIVIL,ZESTRIL) 20 MG tablet Take 20 mg by mouth daily.    . insulin glargine (LANTUS) 100 UNIT/ML injection Inject 40 Units into the skin at bedtime. 1000 mL 3   No current facility-administered medications for this visit.    Allergies:   Penicillins    Social History:  The patient  reports that he has quit smoking. He quit smokeless tobacco use  about 18 years ago. He reports that he drinks about 2.4 oz of alcohol per week. He reports that he does not use illicit drugs.   Family History:  The patient's family history is not on file.    ROS:  Please see the history of present illness.   Otherwise, review of systems are positive for none.   All other systems are reviewed and negative.    PHYSICAL EXAM: VS:  BP 118/62 mmHg  Pulse 70  Ht 5\' 7"  (1.702 m)  Wt 99.156 kg (218 lb 9.6 oz)  BMI 34.23 kg/m2  SpO2 98% , BMI Body mass index is 34.23 kg/(m^2). Affect appropriate Overweight black male  HEENT: normal Neck supple with no adenopathy JVP normal no bruits no thyromegaly Lungs clear with no wheezing and good diaphragmatic motion Heart:  S1/S2 no murmur, no rub, gallop or click PMI normal Abdomen: benighn, BS positve, no tenderness, no AAA no bruit.  No HSM or HJR Distal pulses intact with no bruits No edema Neuro non-focal Skin warm and dry No muscular weakness    EKG:  SR rate 70 normal ECG    Recent Labs: No results found for requested labs within last 365 days.    Lipid Panel    Component  Value Date/Time   CHOL 173 05/27/2012 0952   TRIG 87.0 05/27/2012 0952   HDL 41.80 05/27/2012 0952   CHOLHDL 4 05/27/2012 0952   VLDL 17.4 05/27/2012 0952   LDLCALC 114* 05/27/2012 0952      Wt Readings from Last 3 Encounters:  10/08/15 99.156 kg (218 lb 9.6 oz)  05/27/12 98.884 kg (218 lb)  02/26/12 98.884 kg (218 lb)      Other studies Reviewed: Additional studies/ records that were reviewed today include: [facility] notes Dr Michaelene Song CT report .    ASSESSMENT AND PLAN:  1.  Aneurysm:  Have called [facility] to get report and disc of images. Not surgical in size at 4.5 cm.  Not clear to me why thorax imaged for hematuria or if aneurysm Is descending thoracic or abdominal BP well controlled. Check echo for root dimension and r/o bicuspid AV 2. Preop:  May need left knee and hip surgery  long standing diabetic unable to walk on treadmill f/u Lexiscan myovue 3. DM:  Discussed low carb diet.  Target hemoglobin A1c is 6.5 or less.  Continue current medications. 4. HTN:  Well controlled.  Continue current medications and low sodium Dash type diet.   5. Urology hematuria resolved PSA ok f/u Dr Louis Meckel   Current medicines are reviewed at length with the patient today.  The patient does not have concerns regarding medicines.  The following changes have been made:  no change  Labs/ tests ordered today include: Echo and Lexiscan myovue   No orders of the defined types were placed in this encounter.     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

## 2015-10-08 ENCOUNTER — Encounter: Payer: Self-pay | Admitting: Cardiovascular Disease

## 2015-10-08 ENCOUNTER — Ambulatory Visit (INDEPENDENT_AMBULATORY_CARE_PROVIDER_SITE_OTHER): Payer: Medicare HMO | Admitting: Cardiovascular Disease

## 2015-10-08 VITALS — BP 118/62 | HR 70 | Ht 67.0 in | Wt 218.6 lb

## 2015-10-08 DIAGNOSIS — Z7189 Other specified counseling: Secondary | ICD-10-CM

## 2015-10-08 DIAGNOSIS — R079 Chest pain, unspecified: Secondary | ICD-10-CM

## 2015-10-08 DIAGNOSIS — Z7689 Persons encountering health services in other specified circumstances: Secondary | ICD-10-CM

## 2015-10-08 MED ORDER — LISINOPRIL 20 MG PO TABS: 20.0000 mg | ORAL_TABLET | Freq: Every day | ORAL | Status: DC

## 2015-10-08 MED ORDER — AMLODIPINE BESYLATE 10 MG PO TABS: 10.0000 mg | ORAL_TABLET | Freq: Every day | ORAL | Status: DC

## 2015-10-08 NOTE — Patient Instructions (Addendum)
Medication Instructions:  Your physician recommends that you continue on your current medications as directed. Please refer to the Current Medication list given to you today.  Labwork: NONE  Testing/Procedures: Your physician has requested that you have an echocardiogram. Echocardiography is a painless test that uses sound waves to create images of your heart. It provides your doctor with information about the size and shape of your heart and how well your heart's chambers and valves are working. This procedure takes approximately one hour. There are no restrictions for this procedure.  Your physician has requested that you have a lexiscan myoview. For further information please visit [email or web address]. Please follow instruction sheet, as given.  Follow-Up: Your physician wants you to follow-up in: 6 months with Dr. Nishan. You will receive a reminder letter in the mail two months in advance. If you don't receive a letter, please call our office to schedule the follow-up appointment.   If you need a refill on your cardiac medications before your next appointment, please call your pharmacy.    

## 2015-10-09 ENCOUNTER — Telehealth: Payer: Self-pay | Admitting: *Deleted

## 2015-10-09 NOTE — Telephone Encounter (Signed)
called to infrom them that the lisinopril was correct. expressed understanding.

## 2015-10-21 ENCOUNTER — Telehealth (HOSPITAL_COMMUNITY): Payer: Self-pay | Admitting: *Deleted

## 2015-10-21 NOTE — Telephone Encounter (Signed)
Patient given detailed instructions per Myocardial Perfusion Study Information Sheet for the test on 10/24/15 at 1000. Patient notified to arrive 15 minutes early and that it is imperative to arrive on time for appointment to keep from having the test rescheduled.  If you need to cancel or reschedule your appointment, please call the office within 24 hours of your appointment. Failure to do so may result in a cancellation of your appointment, and a $50 no show fee. Patient verbalized understanding.Malcolm Quast, Ranae Palms

## 2015-10-24 ENCOUNTER — Encounter: Payer: Self-pay | Admitting: Cardiovascular Disease

## 2015-10-24 ENCOUNTER — Ambulatory Visit (INDEPENDENT_AMBULATORY_CARE_PROVIDER_SITE_OTHER)
Admission: RE | Admit: 2015-10-24 | Discharge: 2015-10-24 | Disposition: A | Discharge status: Home / Self Care | Payer: Medicare HMO | Source: Ambulatory Visit | Attending: Cardiovascular Disease | Admitting: Cardiovascular Disease

## 2015-10-24 ENCOUNTER — Other Ambulatory Visit: Payer: Medicare HMO

## 2015-10-24 ENCOUNTER — Telehealth: Payer: Self-pay

## 2015-10-24 ENCOUNTER — Other Ambulatory Visit: Payer: Self-pay | Admitting: Cardiovascular Disease

## 2015-10-24 ENCOUNTER — Ambulatory Visit (INDEPENDENT_AMBULATORY_CARE_PROVIDER_SITE_OTHER): Payer: Medicare HMO | Admitting: Cardiovascular Disease

## 2015-10-24 ENCOUNTER — Ambulatory Visit (HOSPITAL_COMMUNITY): Payer: Medicare HMO

## 2015-10-24 ENCOUNTER — Ambulatory Visit (HOSPITAL_COMMUNITY): Payer: Medicare HMO | Attending: Internal Medicine

## 2015-10-24 ENCOUNTER — Other Ambulatory Visit: Payer: Self-pay

## 2015-10-24 VITALS — BP 130/78 | HR 70 | Resp 11 | Ht 67.0 in | Wt 218.0 lb

## 2015-10-24 DIAGNOSIS — R079 Chest pain, unspecified: Secondary | ICD-10-CM | POA: Insufficient documentation

## 2015-10-24 DIAGNOSIS — G4733 Obstructive sleep apnea (adult) (pediatric): Secondary | ICD-10-CM | POA: Insufficient documentation

## 2015-10-24 DIAGNOSIS — I77810 Thoracic aortic ectasia: Secondary | ICD-10-CM | POA: Insufficient documentation

## 2015-10-24 DIAGNOSIS — E119 Type 2 diabetes mellitus without complications: Secondary | ICD-10-CM | POA: Insufficient documentation

## 2015-10-24 DIAGNOSIS — I71012 Dissection of descending thoracic aorta: Secondary | ICD-10-CM

## 2015-10-24 DIAGNOSIS — I7100 Dissection of unspecified site of aorta: Secondary | ICD-10-CM

## 2015-10-24 DIAGNOSIS — I719 Aortic aneurysm of unspecified site, without rupture: Secondary | ICD-10-CM

## 2015-10-24 DIAGNOSIS — I351 Nonrheumatic aortic (valve) insufficiency: Secondary | ICD-10-CM | POA: Diagnosis not present

## 2015-10-24 DIAGNOSIS — I119 Hypertensive heart disease without heart failure: Secondary | ICD-10-CM | POA: Insufficient documentation

## 2015-10-24 DIAGNOSIS — E785 Hyperlipidemia, unspecified: Secondary | ICD-10-CM | POA: Insufficient documentation

## 2015-10-24 DIAGNOSIS — Z87891 Personal history of nicotine dependence: Secondary | ICD-10-CM | POA: Insufficient documentation

## 2015-10-24 DIAGNOSIS — Z7689 Persons encountering health services in other specified circumstances: Secondary | ICD-10-CM

## 2015-10-24 DIAGNOSIS — Z7189 Other specified counseling: Secondary | ICD-10-CM | POA: Diagnosis not present

## 2015-10-24 MED ORDER — IOPAMIDOL (ISOVUE-370) INJECTION 76%
100.0000 mL | Freq: Once | INTRAVENOUS | Status: AC | PRN
  Administered 2015-10-24: 100 mL via INTRAVENOUS

## 2015-10-24 NOTE — Progress Notes (Signed)
Patient ID: Donald Calderon, male   DOB: [birth year], 75 y.o.   MRN: [id number]     Cardiology Office Note   Date:  10/24/2015   ID:  Donald Calderon, DOB [birth year], MRN [id number]  PCP:  [name], NP  Cardiologist:   Jenkins Rouge, MD   No chief complaint on file.     History of Present Illness: Donald Calderon is a 75 y.o. male initially seen  for evaluation of aortic aneurysm  CRF;s HTN , DM and elevated lipids.  Seen by Dr Louis Meckel for hematuria.  Post TURP by Dr Jeffie Pollock 2012  Apparently CT scan done. I have not been able to get images.  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 No chest pain. BP well controlled. Long standing Insulin dependent diabetic. Activity limited by left knee and hip arthritis Will likely need surgery for this. No trauma, history of syphilis , murmur or bicuspid AV.  Compliant with meds  In office today for echo .  Called to review images by tech. Aortic root 4.6 cm with shadowing in root. Unable to r/o dissection despite changing harmonics and depth In association with small pericardial effusion and AR.  Discussed with patient need for formal chest CTA to image entire aorta.  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  Additional short segment of dissection at a mildly dilated RIGHT common iliac artery.  Aneurysmal dilatation ascending thoracic aorta 4.7 cm diameter at aortic root .   This likely represents  a chronic situation since patient is asymptomatic Generally distal type 3 dissections are handled medically.  Patient and family updated Will still need surveillance of ascending aortic aneurysm.  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 Diabetes mellitus   . Hypertension   . Pneumonia   . Sleep apnea   . GERD (gastroesophageal reflux disease)   . Headache(784.0)   . Arthritis   . Depression     Past Surgical History  Procedure Laterality Date  . Back surgery       Current Outpatient Prescriptions  Medication Sig Dispense Refill  . amLODipine (NORVASC) 10 MG tablet Take 1 tablet (10 mg total) by mouth daily. 30 tablet 11  . gabapentin (NEURONTIN) 800 MG tablet Take 800 mg by mouth 3 (three) times daily.    [name] LEVEMIR 100 UNIT/ML injection Inject 10-30 Units into the skin 2 (two) times daily. 10 units every morning, and 30 units every evening  2  . lisinopril (PRINIVIL,ZESTRIL) 20 MG tablet Take 1 tablet (20 mg total) by mouth daily. 30 tablet 11  . insulin glargine (LANTUS) 100 UNIT/ML injection Inject 40 Units into the skin at bedtime. 1000 mL 3   No current facility-administered medications for this visit.    Allergies:   Penicillins    Social History:  The patient  reports that he has quit smoking. He quit smokeless tobacco use about 18 years ago. He reports that he drinks about 2.4 oz of alcohol per week. He reports that he does not use illicit drugs.   Family History:  The patient's family history is not on file.    ROS:  Please see the history of present illness.   Otherwise, review of systems are positive for none.   All other systems are reviewed and negative.    PHYSICAL EXAM: VS:  BP 130/78 mmHg  Pulse 70  Ht 5\' 7"  (1.702 m)  Wt 98.884 kg (218 lb)  BMI 34.14 kg/m2 , BMI Body mass index is 34.14 kg/(m^2). Affect appropriate Overweight black male    HEENT: normal Neck supple with no adenopathy JVP normal no bruits no thyromegaly Lungs clear with no wheezing and good diaphragmatic motion Heart:  S1/S2 soft AR  murmur, no rub, gallop or click PMI normal Abdomen: benighn, BS positve, no tenderness, no AAA no bruit.  No HSM or HJR Distal pulses intact with no bruits No edema Neuro non-focal Skin warm and dry No muscular weakness    EKG:  SR rate 70 normal ECG    Recent Labs: No results found for requested labs within last 365 days.    Lipid Panel    Component Value Date/Time   CHOL 173 05/27/2012 0952   TRIG 87.0 05/27/2012 0952   HDL 41.80 05/27/2012 0952   CHOLHDL 4 05/27/2012 0952   VLDL 17.4 05/27/2012 0952   LDLCALC 114* 05/27/2012 0952      Wt Readings from Last 3 Encounters:  10/24/15 98.884 kg (218 lb)  10/08/15 99.156 kg (218 lb 9.6 oz)  05/27/12 98.884 kg (218 lb)      Other studies Reviewed: Additional studies/ records that were reviewed today include: [facility] notes Dr Michaelene Song CT report .    ASSESSMENT AND PLAN:  1.  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 Preop:  May need left knee and hip surgery long standing diabetic unable to walk on treadmill His lexiscan was rescheduled To another day since he needed to have CTA today  3. DM:  Discussed low carb diet.  Target hemoglobin A1c is 6.5 or less.  Continue current medications. 4. HTN:  Well controlled.  Continue current medications and low sodium Dash type diet.   5. Urology hematuria resolved PSA ok f/u Dr Louis Meckel   Current medicines are reviewed at length with the patient today.  The patient does not have concerns regarding medicines.  The  following changes have been made:  no change  Labs/ tests ordered today include: CTA done today adn Lexiscan re-ordered  Referral to [facility]    Orders Placed This Encounter  Procedures  . Ambulatory referral to Vascular Surgery     Disposition:   FU with 6 months    Jenkins Rouge

## 2015-10-24 NOTE — Telephone Encounter (Signed)
Per Dr. Johnsie Cancel, will refer patient to [facility] with Dr. Donnetta Hutching for type 3 distal aorta dissection. Order is already in for referral. Left message for patient to call back.

## 2015-10-24 NOTE — Patient Instructions (Addendum)
Medication Instructions:  Your physician recommends that you continue on your current medications as directed. Please refer to the Current Medication list given to you today.  Labwork: NONE  Testing/Procedures: Please reschedule stress test from today.  Chest CT Angiography (CTA) of the Aorta today, is a special type of CT scan that uses a computer to produce multi-dimensional views of major blood vessels throughout the body. In CT angiography, a contrast material is injected through an IV to help visualize the blood vessels  Follow-Up: Your physician wants you to follow-up in: 6 months with Dr. Johnsie Cancel. You will receive a reminder letter in the mail two months in advance. If you don't receive a letter, please call our office to schedule the follow-up appointment.  You have been referred to Dr. Donnetta Hutching at [facility] for Type 3 distal aorta dissection.   If you need a refill on your cardiac medications before your next appointment, please call your pharmacy.

## 2015-10-24 NOTE — Telephone Encounter (Signed)
Made an appointment for patient on Dr. Kyla Balzarine schedule today. Patient will have Chest CT of aorta in office today.

## 2015-11-04 ENCOUNTER — Encounter (HOSPITAL_COMMUNITY): Payer: Medicare HMO

## 2015-11-04 ENCOUNTER — Telehealth (HOSPITAL_COMMUNITY): Payer: Self-pay | Admitting: *Deleted

## 2015-11-04 NOTE — Telephone Encounter (Signed)
Left message on voicemail in reference to upcoming appointment scheduled for 11/07/15. Phone number given for a call back so details instructions can be given. Donald Calderon, Donald Calderon

## 2015-11-05 ENCOUNTER — Telehealth (HOSPITAL_COMMUNITY): Payer: Self-pay

## 2015-11-05 ENCOUNTER — Telehealth (HOSPITAL_COMMUNITY): Payer: Self-pay | Admitting: *Deleted

## 2015-11-05 NOTE — Telephone Encounter (Signed)
Left message on voicemail in reference to upcoming appointment scheduled for 11/07/15. Phone number given for a call back so details instructions can be given. Jolissa Kapral, Ranae Palms

## 2015-11-05 NOTE — Telephone Encounter (Signed)
Patient given detailed instructions per Myocardial Perfusion Study Information Sheet for the test on 11/07/2015 at 9:45. Patient notified to arrive 15 minutes early and that it is imperative to arrive on time for appointment to keep from having the test rescheduled.  If you need to cancel or reschedule your appointment, please call the office within 24 hours of your appointment. Failure to do so may result in a cancellation of your appointment, and a $50 no show fee. Patient verbalized understanding.EHK

## 2015-11-07 ENCOUNTER — Ambulatory Visit (HOSPITAL_COMMUNITY): Payer: Medicare HMO | Attending: Cardiovascular Disease

## 2015-11-07 DIAGNOSIS — R9439 Abnormal result of other cardiovascular function study: Secondary | ICD-10-CM | POA: Diagnosis not present

## 2015-11-07 DIAGNOSIS — Z7189 Other specified counseling: Secondary | ICD-10-CM | POA: Insufficient documentation

## 2015-11-07 DIAGNOSIS — I10 Essential (primary) hypertension: Secondary | ICD-10-CM | POA: Diagnosis not present

## 2015-11-07 DIAGNOSIS — R079 Chest pain, unspecified: Secondary | ICD-10-CM | POA: Diagnosis not present

## 2015-11-07 DIAGNOSIS — E109 Type 1 diabetes mellitus without complications: Secondary | ICD-10-CM | POA: Diagnosis not present

## 2015-11-07 LAB — MYOCARDIAL PERFUSION IMAGING
CHL CUP NUCLEAR SDS: 3
CHL CUP NUCLEAR SRS: 5
CHL CUP NUCLEAR SSS: 8
CSEPPHR: 87 {beats}/min
LHR: 0.26
LV dias vol: 124 mL (ref 62–150)
LVSYSVOL: 52 mL
Rest HR: 61 {beats}/min
TID: 1.04

## 2015-11-07 MED ORDER — REGADENOSON 0.4 MG/5ML IV SOLN
0.4000 mg | Freq: Once | INTRAVENOUS | Status: AC
  Administered 2015-11-07: 0.4 mg via INTRAVENOUS

## 2015-11-07 MED ORDER — TECHNETIUM TC 99M TETROFOSMIN IV KIT
10.8000 | PACK | Freq: Once | INTRAVENOUS | Status: AC | PRN
  Administered 2015-11-07: 11 via INTRAVENOUS
  Filled 2015-11-07: qty 11

## 2015-11-07 MED ORDER — TECHNETIUM TC 99M TETROFOSMIN IV KIT
32.5000 | PACK | Freq: Once | INTRAVENOUS | Status: AC | PRN
  Administered 2015-11-07: 33 via INTRAVENOUS
  Filled 2015-11-07: qty 33

## 2015-11-13 ENCOUNTER — Encounter: Payer: Medicare HMO | Admitting: Surgery

## 2015-11-27 ENCOUNTER — Institutional Professional Consult (permissible substitution) (INDEPENDENT_AMBULATORY_CARE_PROVIDER_SITE_OTHER): Payer: Medicare HMO | Admitting: Surgery

## 2015-11-27 ENCOUNTER — Encounter: Payer: Self-pay | Admitting: Surgery

## 2015-11-27 VITALS — BP 156/84 | HR 78 | Resp 16 | Ht 71.0 in | Wt 220.0 lb

## 2015-11-27 DIAGNOSIS — I712 Thoracic aortic aneurysm, without rupture, unspecified: Secondary | ICD-10-CM

## 2015-11-27 NOTE — Progress Notes (Signed)
Cardiothoracic Surgery Consultation  PCP is EDWARDS, Milford Cage, NP Referring Provider is Josue Hector, MD  Chief Complaint  Patient presents with  . TAA    per [facility] CT A/P 10/02/15    HPI:  The patient is a 75 year old gentleman with hypertension, diabetes with neuropathy and retinal disease who was evaluated by urology for gross hematuria. 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 He was referred to Dr. Johnsie Cancel for further evaluation. 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 There was also a short segment dissection in the mildly dilated right common iliac artery. He denies any prior back or chest pain. He does check his BP at home about once per week and it is usually around [id number] systolic.  Past Medical History  Diagnosis Date  . Diabetes mellitus   . Hypertension   . Pneumonia   . Sleep apnea   . GERD (gastroesophageal reflux disease)   . Headache(784.0)   . Arthritis   . Depression     Past Surgical History  Procedure Laterality Date  . Back surgery      disc for pinched nerve  . Prostate surgery    . Eye surgery Right     Gaucoma    History reviewed. No pertinent family history.  Social History Social History  Substance Use Topics  . Smoking status: Former Smoker -- 1.00 packs/day for 39 years    Types: Cigarettes  . Smokeless tobacco: Former Systems developer    Types: Chew    Quit date: 09/26/1997  . Alcohol Use: 2.4 oz/week    4 Cans of beer per week     Comment: 4 cans of beer per day    Current Outpatient Prescriptions  Medication Sig Dispense Refill  . amLODipine (NORVASC) 10 MG tablet Take 1 tablet (10 mg total) by mouth daily. 30 tablet 11  . gabapentin (NEURONTIN) 800 MG tablet Take 800 mg by mouth 3 (three) times daily.    . insulin  glargine (LANTUS) 100 UNIT/ML injection Inject 40 Units into the skin at bedtime. 1000 mL 3  . LEVEMIR 100 UNIT/ML injection Inject 10-30 Units into the skin 2 (two) times daily. 10 units every morning, and 30 units every evening  2  . lisinopril (PRINIVIL,ZESTRIL) 20 MG tablet Take 1 tablet (20 mg total) by mouth daily. 30 tablet 11   No current facility-administered medications for this visit.    Allergies  Allergen Reactions  . Penicillins Rash    Review of Systems  Constitutional: Negative for activity change and fatigue.  HENT: Negative.        Has not seen dentist in years  Eyes:       Recent change in eyesight. Had recent surgery on right eye.  Cardiovascular: Positive for leg swelling. Negative for chest pain and palpitations.       Orthopnea  Gastrointestinal: Negative.   Endocrine: Negative.   Genitourinary: Positive for frequency.  Musculoskeletal: Positive for gait problem.       Pain in lower extremities with walking and at rest  Uses a cane  Skin: Negative.   Neurological: Positive for numbness.       In feet  Hematological: Negative.   Psychiatric/Behavioral: Negative.  BP 156/84 mmHg  Pulse 78  Resp 16  Ht 5\' 11"  (1.803 m)  Wt 220 lb (99.791 kg)  BMI 30.70 kg/m2  SpO2 96% Physical Exam  Constitutional: He is oriented to person, place, and time. He appears well-developed and well-nourished. No distress.  HENT:  Head: Normocephalic and atraumatic.  Eyes: EOM are normal. Pupils are equal, round, and reactive to light.  Neck: Normal range of motion. Neck supple. No JVD present. No thyromegaly present.  Scar right neck from benign tumor removal in past  Cardiovascular: Normal rate, regular rhythm, normal heart sounds and intact distal pulses.   No murmur heard. Pulmonary/Chest: Effort normal and breath sounds normal. No respiratory distress. He has no wheezes. He has no rales.  Abdominal: Soft. Bowel sounds are normal. He exhibits no distension and no  mass. There is no tenderness.  Musculoskeletal: Normal range of motion. He exhibits no edema.  Lymphadenopathy:    He has no cervical adenopathy.  Neurological: He is alert and oriented to person, place, and time. He has normal strength. No cranial nerve deficit or sensory deficit.  Skin: Skin is warm and dry.  Psychiatric: He has a normal mood and affect.     Diagnostic Tests:    [facility]  [address] [place],  [address]  [phone]  ------------------------------------------------------------------- Transthoracic Echocardiography  Patient: Gilford, Hockersmith MR #: [id number] Study Date: 10/24/2015 Gender: M Age: 40 Height: 170.2 cm Weight: 99.2 kg BSA: 2.2 m^2 Pt. Status: Room:  Jetty Duhamel, M.D. REFERRING Jenkins Rouge, M.D. ATTENDING Lyman Bishop MD PERFORMING [facility], Outpatient SONOGRAPHER [facility] RDCS  cc:  ------------------------------------------------------------------- LV EF: 55% - 60%  ------------------------------------------------------------------- Indications: Chest Pain (R07.9).  ------------------------------------------------------------------- History: Risk factors: Obstructive Sleep Apnea  Former tobacco use. Hypertension. Diabetes mellitus. Dyslipidemia.  ------------------------------------------------------------------- Study Conclusions  - Left ventricle: The cavity size was normal. Wall thickness was  increased in a pattern of mild LVH. Systolic function was normal.  The estimated ejection fraction was in the range of 55% to 60%.  Wall motion was normal; there were no regional wall motion  abnormalities. Doppler parameters are consistent with abnormal  left ventricular relaxation (grade 1 diastolic dysfunction). - Aortic valve: There was mild  regurgitation. - Aortic root: The aortic root was moderately dilated. - Left atrium: The atrium was mildly dilated.  Impressions:  - Normal LV systolic function; grade 1 diastolic dysfunction; mild  AI; mild LAE; moderately dilated ascending aorta (4.6 cm); cannot  R/O dissection; patient seen by Dr Johnsie Cancel in clinic.  Transthoracic echocardiography. M-mode, complete 2D, spectral Doppler, and color Doppler. Birthdate: Patient birthdate: Sep 17, 1940. Age: Patient is 75 yr old. Sex: Gender: male. BMI: 34.2 kg/m^2. Blood pressure: 118/62 Patient status: Outpatient. Study date: Study date: 10/24/2015. Study time: 09:30 AM. Location: [facility]  -------------------------------------------------------------------  ------------------------------------------------------------------- Left ventricle: The cavity size was normal. Wall thickness was increased in a pattern of mild LVH. Systolic function was normal. The estimated ejection fraction was in the range of 55% to 60%. Wall motion was normal; there were no regional wall motion abnormalities. Doppler parameters are consistent with abnormal left ventricular relaxation (grade 1 diastolic dysfunction).  ------------------------------------------------------------------- Aortic valve: Trileaflet; normal thickness leaflets. Mobility was not restricted. Doppler: Transvalvular velocity was within the normal range. There was no stenosis. There was mild regurgitation.  ------------------------------------------------------------------- Aorta: Aortic root: The aortic root was moderately dilated.  ------------------------------------------------------------------- Mitral valve: Structurally normal valve. Mobility was not restricted. Doppler: Transvalvular velocity was  within the normal range. There was no evidence for stenosis. There was  trivial regurgitation.  ------------------------------------------------------------------- Left atrium: The atrium was mildly dilated.  ------------------------------------------------------------------- Right ventricle: The cavity size was normal. Systolic function was normal.  ------------------------------------------------------------------- Pulmonic valve: Doppler: Transvalvular velocity was within the normal range. There was no evidence for stenosis. There was trivial regurgitation.  ------------------------------------------------------------------- Tricuspid valve: Structurally normal valve. Doppler: Transvalvular velocity was within the normal range. There was trivial regurgitation.  ------------------------------------------------------------------- Right atrium: The atrium was normal in size.  ------------------------------------------------------------------- Pericardium: There was no pericardial effusion.  ------------------------------------------------------------------- Systemic veins: Inferior vena cava: The vessel was normal in size.  ------------------------------------------------------------------- Measurements  Left ventricle Value Reference LV ID, ED, PLAX chordal (L) 42.1 mm 43 - 52 LV ID, ES, PLAX chordal 24 mm 23 - 38 LV fx shortening, PLAX chordal 43 % >=29 LV PW thickness, ED 12.1 mm --------- IVS/LV PW ratio, ED 0.97 <=1.3 Stroke volume, 2D 121 ml --------- Stroke volume/bsa, 2D 55 ml/m^2 --------- LV ejection fraction, 1-p A4C 64 % --------- LV end-diastolic volume, 2-p 123XX123 ml --------- LV end-systolic volume, 2-p 55 ml --------- LV  ejection fraction, 2-p 67 % --------- Stroke volume, 2-p 114 ml --------- LV end-diastolic volume/bsa, 2-p 77 ml/m^2 --------- LV end-systolic volume/bsa, 2-p 25 ml/m^2 --------- Stroke volume/bsa, 2-p 51.8 ml/m^2 --------- LV e&', lateral 12.2 cm/s --------- LV E/e&', lateral 5.54 --------- LV s&', lateral 12 cm/s --------- LV e&', medial 7.24 cm/s --------- LV E/e&', medial 9.34 --------- LV e&', average 9.72 cm/s --------- LV E/e&', average 6.95 ---------  Ventricular septum Value Reference IVS thickness, ED 11.7 mm ---------  LVOT Value Reference LVOT ID, S 24 mm --------- LVOT area 4.52 cm^2 --------- LVOT peak velocity, S 118 cm/s --------- LVOT mean velocity, S 83.4 cm/s --------- LVOT VTI, S 26.7 cm --------- LVOT peak gradient, S 6 mm Hg ---------  Aorta Value Reference Aortic root ID, ED 45 mm ---------  Left atrium Value Reference LA ID, A-P, ES 43 mm --------- LA ID/bsa, A-P 1.95 cm/m^2 <=2.2 LA volume, S 65 ml --------- LA volume/bsa, S 29.5 ml/m^2 --------- LA volume, ES, 1-p  A4C 67 ml --------- LA volume/bsa, ES, 1-p A4C 30.4 ml/m^2 --------- LA volume, ES, 1-p A2C 58 ml --------- LA volume/bsa, ES, 1-p A2C 26.3 ml/m^2 ---------  Mitral valve Value Reference Mitral E-wave peak velocity 67.6 cm/s --------- Mitral A-wave peak velocity 84.4 cm/s --------- Mitral deceleration time (H) 310 ms 150 - 230 Mitral E/A ratio, peak 0.8 ---------  Right ventricle Value Reference RV s&', lateral, S 12 cm/s ---------  Legend: (L) and (H) mark values outside specified reference range.  ------------------------------------------------------------------- Prepared and Electronically Authenticated by  Kirk Ruths 2017-05-11T17:22:49      Study Highlights     Nuclear stress EF: 58%.  There was no ST segment deviation noted during stress.  This is a low risk study. Arlice Colt is no evidence of ischemia.  Defect 1: There is a small defect of mild severity present in the basal inferior location. 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       CLINICAL DATA: Aortic dissection a techocardiography today, history diabetes mellitus, hypertension, former smoker  EXAM: CT ANGIOGRAPHY CHEST, ABDOMEN AND PELVIS  TECHNIQUE: Multidetector CT imaging through the chest, abdomen and pelvis was performed using the standard protocol during bolus administration of intravenous contrast. Multiplanar reconstructed images and MIPs were obtained and reviewed to evaluate the vascular anatomy. Precontrast images of the thoracic aorta were obtained as well.  CONTRAST:  100 cc Isovue 370 IV  COMPARISON: CT abdomen and pelvis 10/02/2015  FINDINGS: CTA CHEST FINDINGS  Few  scattered atherosclerotic calcifications within the aorta and coronary arteries.  No high attenuation mural crescent identified within aorta to suggest intramural hemorrhage.  Aneurysmal dilatation ascending thoracic aorta, measures 4.4 cm transverse on image 26 and 4.7 cm transverse more proximally near the aortic root image 52.  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  This is an atypical location for isolated aortic dissection, question raised of whether this could be the result of a penetrating ulcer with secondary dissection.  No evidence of aortic rupture or periaortic infiltration.  Pulmonary arteries appear grossly patent on non targeted exam.  Tortuous innominate artery/RIGHT subclavian artery.  No thoracic adenopathy.  Atelectasis at posteromedial RIGHT lower lobe.  Remaining lungs clear.  No infiltrate, pleural effusion or pneumothorax.  Review of the MIP images confirms the above findings.  CTA ABDOMEN AND PELVIS FINDINGS  Abdominal aorta normal caliber with few scattered atherosclerotic calcifications.  No evidence of abdominal aortic aneurysm.  Celiac, SMA, IMA, and renal artery origins appear patent, with mild atherosclerotic calcifications noted at the renal artery origins.  Short segment of dissection at a dilated RIGHT common iliac artery 1.9 cm diameter, extending to common iliac bifurcation.  No periaortic infiltration or hemorrhage.  Liver, gallbladder, spleen, pancreas, and kidneys normal appearance.  Fat attenuation LEFT adrenal mass 19 x 17 mm compatible with myelolipoma.  2.9 x 2.6 x 3.2 cm diameter indeterminate soft tissue mass RIGHT adrenal gland on postcontrast CT; prior noncontrast CT showed this mass measured 1.90 HU attenuation consistent with an  adrenal adenoma.  Unremarkable bladder and ureters.  Prostatic enlargement, gland 5.4 x 4.6 x ~4.2 cm.  Stomach and bowel loops normal appearance.  No additional mass, adenopathy, free air or free fluid.  Prior lumbar fusion at L3-L4.  No acute osseous findings.  Review of the MIP images confirms the above findings.  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  Additional short segment of dissection at a mildly dilated RIGHT common iliac artery.  Aneurysmal dilatation ascending thoracic aorta 4.7 cm diameter at aortic root, recommendation below. Recommend semi-annual imaging followup by CTA or MRA and referral to cardiothoracic surgery if not already obtained. This recommendation follows 2010 ACCF/AHA/AATS/ACR/ASA/SCA/SCAI/SIR/STS/SVM Guidelines for the Diagnosis and Management of Patients With Thoracic Aortic Disease. Circulation. 2010; 121: e266-e36  Prostatic enlargement.  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  Findings called to Dr. Johnsie Cancel on 10/24/2015 at 1205 hours.   Electronically Signed  By: Lavonia Dana M.D.  On: 10/24/2015 12:08   Impression:  I have personally reviewed his CT scans and echo. 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 He denies any prior symptoms to suggest aortic dissection. The descending aorta is about 3.1 cm in diameter. It is possible that this could be a penetrating ulcer with development of a pseudoaneurysm but I think it looks more like a true dissection. His echo shows a trileaflet aortic valve with no stenosis and only  mild insufficiency. I don't think he requires any surgical treatment for these problems at this time but they will require close followup. I reviewed the scans with him and his wife and discussed the indications for surgical treatment. I stressed the importance of good BP control and compliance with his medications and a low sodium diet.    Plan:  I will see him back in 6 months with a CTA of the chest, abdomen and pelvis to follow up on the aortic root and ascending aneurysm and the descending aortic dissection.  Gaye Pollack, MD [facility] [phone]

## 2016-05-05 ENCOUNTER — Other Ambulatory Visit: Payer: Self-pay | Admitting: Surgery

## 2016-05-05 DIAGNOSIS — I712 Thoracic aortic aneurysm, without rupture, unspecified: Secondary | ICD-10-CM

## 2016-05-14 ENCOUNTER — Other Ambulatory Visit: Payer: Self-pay | Admitting: Surgery

## 2016-05-14 DIAGNOSIS — I712 Thoracic aortic aneurysm, without rupture, unspecified: Secondary | ICD-10-CM

## 2016-05-26 ENCOUNTER — Other Ambulatory Visit: Payer: Self-pay | Admitting: Surgery

## 2016-05-26 DIAGNOSIS — I729 Aneurysm of unspecified site: Secondary | ICD-10-CM

## 2016-05-27 ENCOUNTER — Ambulatory Visit: Payer: Medicare HMO | Admitting: Surgery

## 2016-05-27 ENCOUNTER — Other Ambulatory Visit: Payer: Self-pay | Admitting: Surgery

## 2016-05-27 ENCOUNTER — Inpatient Hospital Stay: Admission: RE | Admit: 2016-05-27 | Payer: Medicare HMO | Source: Ambulatory Visit

## 2016-05-27 ENCOUNTER — Other Ambulatory Visit: Payer: Medicare HMO

## 2016-05-27 DIAGNOSIS — I712 Thoracic aortic aneurysm, without rupture, unspecified: Secondary | ICD-10-CM

## 2016-06-10 ENCOUNTER — Inpatient Hospital Stay: Admission: RE | Admit: 2016-06-10 | Payer: Medicare HMO | Source: Ambulatory Visit

## 2016-06-10 ENCOUNTER — Ambulatory Visit: Payer: Medicare HMO | Admitting: Surgery

## 2016-06-10 ENCOUNTER — Other Ambulatory Visit: Payer: Medicare HMO

## 2016-10-22 ENCOUNTER — Encounter (HOSPITAL_COMMUNITY): Payer: Self-pay

## 2016-10-22 ENCOUNTER — Emergency Department (HOSPITAL_COMMUNITY)
Admission: EM | Admit: 2016-10-22 | Discharge: 2016-10-22 | Disposition: A | Discharge status: Home / Self Care | Payer: Medicare HMO | Attending: Emergency Medicine | Admitting: Emergency Medicine

## 2016-10-22 DIAGNOSIS — E876 Hypokalemia: Secondary | ICD-10-CM | POA: Diagnosis not present

## 2016-10-22 DIAGNOSIS — E119 Type 2 diabetes mellitus without complications: Secondary | ICD-10-CM | POA: Insufficient documentation

## 2016-10-22 DIAGNOSIS — I10 Essential (primary) hypertension: Secondary | ICD-10-CM | POA: Diagnosis not present

## 2016-10-22 DIAGNOSIS — Z794 Long term (current) use of insulin: Secondary | ICD-10-CM | POA: Insufficient documentation

## 2016-10-22 DIAGNOSIS — Z87891 Personal history of nicotine dependence: Secondary | ICD-10-CM | POA: Insufficient documentation

## 2016-10-22 DIAGNOSIS — K649 Unspecified hemorrhoids: Secondary | ICD-10-CM | POA: Diagnosis not present

## 2016-10-22 DIAGNOSIS — K625 Hemorrhage of anus and rectum: Secondary | ICD-10-CM

## 2016-10-22 HISTORY — DX: Alcohol abuse, uncomplicated: F10.10

## 2016-10-22 LAB — CBC
HEMATOCRIT: 45.8 % (ref 39.0–52.0)
HEMOGLOBIN: 15.3 g/dL (ref 13.0–17.0)
MCH: 29.8 pg (ref 26.0–34.0)
MCHC: 33.4 g/dL (ref 30.0–36.0)
MCV: 89.1 fL (ref 78.0–100.0)
Platelets: 195 10*3/uL (ref 150–400)
RBC: 5.14 MIL/uL (ref 4.22–5.81)
RDW: 13 % (ref 11.5–15.5)
WBC: 8.2 10*3/uL (ref 4.0–10.5)

## 2016-10-22 LAB — COMPREHENSIVE METABOLIC PANEL
ALBUMIN: 3.5 g/dL (ref 3.5–5.0)
ALT: 26 U/L (ref 17–63)
ANION GAP: 11 (ref 5–15)
AST: 36 U/L (ref 15–41)
Alkaline Phosphatase: 60 U/L (ref 38–126)
BUN: 18 mg/dL (ref 6–20)
CHLORIDE: 90 mmol/L — AB (ref 101–111)
CO2: 28 mmol/L (ref 22–32)
Calcium: 9 mg/dL (ref 8.9–10.3)
Creatinine, Ser: 1.4 mg/dL — ABNORMAL HIGH (ref 0.61–1.24)
GFR calc Af Amer: 55 mL/min — ABNORMAL LOW (ref >60)
GFR calc non Af Amer: 48 mL/min — ABNORMAL LOW (ref >60)
GLUCOSE: 155 mg/dL — AB (ref 65–99)
POTASSIUM: 2.8 mmol/L — AB (ref 3.5–5.1)
Sodium: 129 mmol/L — ABNORMAL LOW (ref 135–145)
Total Bilirubin: 0.8 mg/dL (ref 0.3–1.2)
Total Protein: 6.5 g/dL (ref 6.5–8.1)

## 2016-10-22 LAB — TYPE AND SCREEN
ABO/RH(D): O POS
Antibody Screen: NEGATIVE

## 2016-10-22 LAB — POC OCCULT BLOOD, ED: Fecal Occult Bld: POSITIVE — AB

## 2016-10-22 MED ORDER — POTASSIUM CHLORIDE CRYS ER 20 MEQ PO TBCR
40.0000 meq | EXTENDED_RELEASE_TABLET | Freq: Once | ORAL | Status: AC
  Administered 2016-10-22: 40 meq via ORAL
  Filled 2016-10-22: qty 2

## 2016-10-22 MED ORDER — POTASSIUM CHLORIDE ER 10 MEQ PO TBCR: 10.0000 meq | EXTENDED_RELEASE_TABLET | Freq: Every day | ORAL | 0 refills | Status: DC

## 2016-10-22 NOTE — ED Provider Notes (Signed)
[facility] DEPT Provider Note   CSN: [id number] Arrival date & time: 10/22/16  1523     History   Chief Complaint Chief Complaint  Patient presents with  . Rectal Bleeding    HPI Donald Calderon is a 76 y.o. male.  The history is provided by the patient.  Rectal Bleeding  Quality:  Bright red Amount:  Moderate Duration:  3 days Timing:  Intermittent Chronicity:  New Context: hemorrhoids (also history of polyps)   Context: not anal fissures, not anal penetration, not constipation, not defecation, not diarrhea, not foreign body, not rectal injury, not rectal pain and not spontaneously   Relieved by:  Nothing Worsened by:  Nothing Associated symptoms: light-headedness   Associated symptoms: no abdominal pain, no dizziness, no epistaxis, no fever, no hematemesis, no loss of consciousness, no recent illness and no vomiting   Risk factors: NSAID use   Risk factors: no anticoagulant use, no hx of colorectal cancer, no hx of colorectal surgery, no hx of IBD, no liver disease and no steroid use   Risk factors comment:  Does use ETOH   Past Medical History:  Diagnosis Date  . ALCOHOL ABUSE   . Arthritis   . Depression   . Diabetes mellitus   . GERD (gastroesophageal reflux disease)   . Headache(784.0)   . Hypertension   . Pneumonia   . Sleep apnea     Patient Active Problem List   Diagnosis Date Noted  . Back pain, acute 09/27/2011  . NEUROPATHY 08/08/2010  . SWELLING MASS OR LUMP IN HEAD AND NECK 06/27/2010  . BENIGN PROSTATIC HYPERTROPHY, WITH URINARY OBSTRUCTION 06/19/2010  . PERIPHERAL NEUROPATHY 05/12/2010  . HYPOKALEMIA 03/18/2010  . 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 DIABETES MELLITUS, TYPE II 02/07/2010  . HYPERLIPIDEMIA 02/07/2010  . ALCOHOL ABUSE 02/07/2010  . HYPERTENSION 02/07/2010  . 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 BACK SURGERY     disc for pinched nerve  . EYE SURGERY Right    Gaucoma  . PROSTATE  SURGERY         Home Medications    Prior to Admission medications   Medication Sig Start Date End Date Taking? Authorizing Provider  amLODipine (NORVASC) 10 MG tablet Take 1 tablet (10 mg total) by mouth daily. 10/08/15   Josue Hector, MD  gabapentin (NEURONTIN) 800 MG tablet Take 800 mg by mouth 3 (three) times daily.    [provider]  insulin glargine (LANTUS) 100 UNIT/ML injection Inject 40 Units into the skin at bedtime. 07/18/12 11/27/15  Kennyth Arnold, FNP  LEVEMIR 100 UNIT/ML injection Inject 10-30 Units into the skin 2 (two) times daily. 10 units every morning, and 30 units every evening 08/29/15   [provider]  lisinopril (PRINIVIL,ZESTRIL) 20 MG tablet Take 1 tablet (20 mg total) by mouth daily. 10/08/15   Josue Hector, MD  potassium chloride (K-DUR) 10 MEQ tablet Take 1 tablet (10 mEq total) by mouth daily. 10/22/16 10/27/16  Lennice Sites, DO    Family History No family history on file.  Social History Social History  Substance Use Topics  . Smoking status: Former Smoker    Packs/day: 1.00    Years: 39.00    Types: Cigarettes  . Smokeless tobacco: Former Systems developer    Types: Chew    Quit date: 09/26/1997  . Alcohol use 2.4 oz/week    4 Cans of beer per week     Comment:  4 cans of beer per day     Allergies   Penicillins   Review of Systems Review of Systems  Constitutional: Negative for chills and fever.  HENT: Negative for ear pain, nosebleeds and sore throat.   Eyes: Negative for pain and visual disturbance.  Respiratory: Negative for cough and shortness of breath.   Cardiovascular: Negative for chest pain and palpitations.  Gastrointestinal: Positive for blood in stool and hematochezia. Negative for abdominal distention, abdominal pain, diarrhea, hematemesis, nausea, rectal pain and vomiting.  Genitourinary: Negative for dysuria and hematuria.  Musculoskeletal: Negative for arthralgias and back pain.  Skin: Negative for color change  and rash.  Neurological: Positive for light-headedness. Negative for dizziness, seizures, loss of consciousness and syncope.  All other systems reviewed and are negative.    Physical Exam Updated Vital Signs  ED Triage Vitals [10/22/16 1558]  Enc Vitals Group     BP 125/75     Pulse Rate 84     Resp 18     Temp 99.1 F (37.3 C)     Temp Source Oral     SpO2 96 %     Weight 218 lb (98.9 kg)     Height 5\' 9"  (1.753 m)     Head Circumference      Peak Flow      Pain Score      Pain Loc      Pain Edu?      Excl. in [place]?     Physical Exam  Constitutional: He is oriented to person, place, and time. He appears well-developed and well-nourished.  HENT:  Head: Normocephalic and atraumatic.  Eyes: Conjunctivae and EOM are normal. Pupils are equal, round, and reactive to light.  Neck: Normal range of motion. Neck supple.  Cardiovascular: Normal rate, regular rhythm, normal heart sounds and intact distal pulses.   No murmur heard. Pulmonary/Chest: Effort normal and breath sounds normal. No respiratory distress.  Abdominal: Soft. He exhibits no distension and no mass. There is no tenderness. There is no rebound and no guarding.  Genitourinary: Rectal exam shows guaiac negative stool.  Genitourinary Comments: Hemorrhoid seen on exam, no grossly blood or melena on rectal exam, no pain, no fissures  Musculoskeletal: He exhibits no edema.  Neurological: He is alert and oriented to person, place, and time.  Skin: Skin is warm and dry. Capillary refill takes less than 2 seconds. No pallor.  Psychiatric: He has a normal mood and affect.  Nursing note and vitals reviewed.    ED Treatments / Results  Labs (all labs ordered are listed, but only abnormal results are displayed) Labs Reviewed  COMPREHENSIVE METABOLIC PANEL - Abnormal; Notable for the following:       Result Value   Sodium 129 (*)    Potassium 2.8 (*)    Chloride 90 (*)    Glucose, Bld 155 (*)    Creatinine, Ser 1.40  (*)    GFR calc non Af Amer 48 (*)    GFR calc Af Amer 55 (*)    All other components within normal limits  POC OCCULT BLOOD, ED - Abnormal; Notable for the following:    Fecal Occult Bld POSITIVE (*)    All other components within normal limits  CBC  POC OCCULT BLOOD, ED  TYPE AND SCREEN    EKG  EKG Interpretation None       Radiology No results found.  Procedures Procedures (including critical care time)  Medications Ordered in ED Medications  potassium chloride SA (K-DUR,KLOR-CON) CR tablet 40 mEq (40 mEq Oral Given 10/22/16 2058)     Initial Impression / Assessment and Plan / ED Course  I have reviewed the triage vital signs and the nursing notes.  Pertinent labs & imaging results that were available during my care of the patient were reviewed by me and considered in my medical decision making (see chart for details).     Donald Calderon is a 76 year old male with history of diabetes, hypertension, reflux, alcohol abuse who presents to the ED with bright red blood per rectum. Patient's vitals at time of arrival to the ER are unremarkable and patient is without fever. Patient has noticed some bright red blood every time he uses the bathroom over the last several days. Patient does not have any pain with bowel movements. Patient does have history of hemorrhoids. He is not on any anticoagulation. Patient with no abdominal pain, no nausea, no vomiting, no constipation. Patient has history of bleeding hemorrhoids in the past. Patient had colonoscopy 10 years ago that showed polyps but no history of colon cancer. On exam, patient is well-appearing and no signs of pallor, no tachycardia and abdomen is benign. On rectal exam, patient has no gross melena or hematochezia. Patient does have external hemorrhoid but no obvious bleeding or thrombosis, no fissures. Patient with labs drawn prior to my evaluation. Hemoglobin within normal limits and and patient with hypokalemia and given PO  potassium and overall labs unremarkable. Patient likely with bleeding from internal hemorrhoids versus polyps. Patient likely needs colonoscopy outpatient. Recommend increase fiber in his diet. Patient has no chest pain, no syncope at this time and is stable for discharge to home. Recommend follow-up with primary care provider and told to return to the ED if bleeding worsens or other symptoms arise including chest pain, shortness of breath, syncope. Patient given information for ETOH abuse and counseled on alcohol abuse. Patient given potassium rx for home.  Final Clinical Impressions(s) / ED Diagnoses   Final diagnoses:  Rectal bleeding  Hypokalemia    New Prescriptions Discharge Medication List as of 10/22/2016  9:14 PM    START taking these medications   Details  potassium chloride (K-DUR) 10 MEQ tablet Take 1 tablet (10 mEq total) by mouth daily., Starting Thu 10/22/2016, Until Tue 10/27/2016, Print         [place], [place], DO 10/23/16 [place], [place], DO 10/23/16 [id number]

## 2016-10-22 NOTE — ED Provider Notes (Signed)
I have personally seen and examined the patient. I have reviewed the documentation on PMH/FH/Soc Hx. I have discussed the plan of care with the resident and patient.  I have reviewed and agree with the resident's documentation. Please see associated encounter note.   EKG Interpretation None         Cardama, Grayce Sessions, MD 10/22/16 2028

## 2016-10-22 NOTE — ED Notes (Signed)
Pt stable, understands discharge instructions, and reasons for return.   

## 2016-10-22 NOTE — ED Triage Notes (Signed)
Pt reports bright red rectal bleeding with each stool about 3 x a day. PT states each time he has bm toilet is full of blood. Pt denies abd pain but reports "stomach is bubbling a lot". PT endorses weakness and dizziness. Denies blood thinner. PT sates he has hx of Hemorid but not at this time.

## 2017-01-04 ENCOUNTER — Other Ambulatory Visit: Payer: Self-pay | Admitting: Orthopedic Surgery

## 2017-01-04 ENCOUNTER — Ambulatory Visit: Payer: Self-pay | Admitting: Physician Assistant

## 2017-01-04 DIAGNOSIS — M48062 Spinal stenosis, lumbar region with neurogenic claudication: Secondary | ICD-10-CM

## 2017-01-12 ENCOUNTER — Ambulatory Visit
Admission: RE | Admit: 2017-01-12 | Discharge: 2017-01-12 | Disposition: A | Discharge status: Home / Self Care | Payer: Medicare HMO | Source: Ambulatory Visit | Attending: Orthopedic Surgery | Admitting: Orthopedic Surgery

## 2017-01-12 DIAGNOSIS — M48062 Spinal stenosis, lumbar region with neurogenic claudication: Secondary | ICD-10-CM

## 2017-01-14 NOTE — Pre-Procedure Instructions (Addendum)
Donald Calderon  01/14/2017      [facility] [place], [place] - [address] [place] [address] Phone: [phone] Fax: [place], [place] - [address] [facility] [address] [place] [address] Phone: [phone] Fax: [phone]  [facility] [place], [place] [address] [place] [address] Phone: [phone] Fax: [phone]    Your procedure is scheduled on Thursday, August 9th   Report to [facility] Admitting at 10:30 AM             (posted surgery time 12:30 p - 3:00p)   Call this number if you have problems the morning of surgery:  [phone], for other questions call [phone] Mon-Fri from 8a-4p   Remember:   Do not eat food or drink liquids after midnight Wednesday.                4-5 days prior to surgery, STOP TAKING any vitamins, herbal supplements, anti-inflammatories   Take these medicines the morning of surgery with A SIP OF WATER : Norvasc, Lyrica   Do not wear jewelry - no rings or watches.  Do not wear lotions, colognes or deoderant.             Men may shave face and neck.   Do not bring valuables to the hospital.  [facility] is not responsible for any belongings or valuables.  Contacts, dentures or bridgework may not be worn into surgery.  Leave your suitcase in the car.  After surgery it may be brought to your room.  For patients admitted to the hospital, discharge time will be determined by your treatment team.  Please read over the following fact sheets that you were given. Pain Booklet, MRSA Information and Surgical Site Infection Prevention      How to Manage Your Diabetes Before and After Surgery  Why is it important to control my blood sugar before and after surgery? . Improving blood sugar levels before and after surgery helps  healing and can limit problems. . A way of improving blood sugar control is eating a healthy diet by: o  Eating less sugar and carbohydrates o  Increasing activity/exercise o  Talking with your doctor about reaching your blood sugar goals o  . High blood sugars (greater than 180 mg/dL) can raise your risk of infections and slow your recovery, so you will need to focus on controlling your diabetes during the weeks before surgery. .  . Make sure that the doctor who takes care of your diabetes knows about your planned surgery including the date and location.  How do I manage my blood sugar before surgery? . Check your blood sugar at least 4 times a day, starting 2 days before surgery, to make sure that the level is not too high or low. o Check your blood sugar the morning of your surgery when you wake up and every 2 hours until you get to the Short Stay unit. o  . If your blood sugar is less than 70 mg/dL, you will need to treat for low blood sugar: o Do not take insulin. o Treat a low blood sugar (less than 70 mg/dL) with  cup of clear juice (cranberry or apple), 4 glucose tablets, OR glucose gel. o  o Recheck blood sugar in 15 minutes  after treatment (to make sure it is greater than 70 mg/dL). If your blood sugar is not greater than 70 mg/dL on recheck, call [phone] for further instructions. . Report your blood sugar to the short stay nurse when you get to Short Stay.  . If you are admitted to the hospital after surgery: o Your blood sugar will be checked by the staff and you will probably be given insulin after surgery (instead of oral diabetes medicines) to make sure you have good blood sugar levels. o The goal for blood sugar control after surgery is 80-180 mg/dL.   WHAT DO I DO ABOUT MY DIABETES MEDICATION?   [name] Do not take oral diabetes medicines (pills) the morning of surgery.  . THE NIGHT BEFORE SURGERY, take 15 units of [place]  insulin and with the Lantus, you will take  only 20 units at bedtime    . THE MORNING OF SURGERY, take _5_ units of_Levemire_insulin.  . The day of surgery, do not take other diabetes injectables, including Byetta (exenatide), Bydureon (exenatide ER), Victoza (liraglutide), or Trulicity (dulaglutide).  . If your CBG is greater than 220 mg/dL, you may take  of your sliding scale (correction) dose of insulin.  Other Instructions:          Patient Signature:  Date:   Nurse Signature:  Date:   Reviewed and Endorsed by [facility] Patient Education Committee, August 2015       [facility]- Preparing For Surgery  Before surgery, you can play an important role. Because skin is not sterile, your skin needs to be as free of germs as possible. You can reduce the number of germs on your skin by washing with CHG (chlorahexidine gluconate) Soap before surgery.  CHG is an antiseptic cleaner which kills germs and bonds with the skin to continue killing germs even after washing.  Please do not use if you have an allergy to CHG or antibacterial soaps. If your skin becomes reddened/irritated stop using the CHG.  Do not shave (including legs and underarms) for at least 48 hours prior to first CHG shower. It is OK to shave your face.  Please follow these instructions carefully.   1. Shower the NIGHT BEFORE SURGERY and the MORNING OF SURGERY with CHG.   2. If you chose to wash your hair, wash your hair first as usual with your normal shampoo.  3. After you shampoo, rinse your hair and body thoroughly to remove the shampoo.  4. Use CHG as you would any other liquid soap. You can apply CHG directly to the skin and wash gently with a scrungie or a clean washcloth.   5. Apply the CHG Soap to your body ONLY FROM THE NECK DOWN.  Do not use on open wounds or open sores. Avoid contact with your eyes, ears, mouth and genitals (private parts). Wash genitals (private parts) with your normal soap.  6. Wash thoroughly, paying special attention to the  area where your surgery will be performed.  7. Thoroughly rinse your body with warm water from the neck down.  8. DO NOT shower/wash with your normal soap after using and rinsing off the CHG Soap.  9. Pat yourself dry with a CLEAN TOWEL.   10. Wear CLEAN PAJAMAS   11. Place CLEAN SHEETS on your bed the night of your first shower and DO NOT SLEEP WITH PETS.    Day of Surgery: Do not apply any deodorants/lotions. Please wear clean clothes to the hospital/surgery center.

## 2017-01-15 ENCOUNTER — Encounter (HOSPITAL_COMMUNITY)
Admission: RE | Admit: 2017-01-15 | Discharge: 2017-01-15 | Disposition: A | Discharge status: Home / Self Care | Payer: Medicare HMO | Source: Ambulatory Visit | Attending: Orthopedic Surgery | Admitting: Orthopedic Surgery

## 2017-01-15 ENCOUNTER — Encounter (HOSPITAL_COMMUNITY): Payer: Self-pay

## 2017-01-15 DIAGNOSIS — M5442 Lumbago with sciatica, left side: Secondary | ICD-10-CM | POA: Diagnosis not present

## 2017-01-15 DIAGNOSIS — Z794 Long term (current) use of insulin: Secondary | ICD-10-CM | POA: Insufficient documentation

## 2017-01-15 DIAGNOSIS — M48061 Spinal stenosis, lumbar region without neurogenic claudication: Secondary | ICD-10-CM | POA: Insufficient documentation

## 2017-01-15 DIAGNOSIS — Z01812 Encounter for preprocedural laboratory examination: Secondary | ICD-10-CM | POA: Insufficient documentation

## 2017-01-15 DIAGNOSIS — Z79899 Other long term (current) drug therapy: Secondary | ICD-10-CM | POA: Insufficient documentation

## 2017-01-15 HISTORY — DX: Anxiety disorder, unspecified: F41.9

## 2017-01-15 LAB — BASIC METABOLIC PANEL
ANION GAP: 10 (ref 5–15)
BUN: 16 mg/dL (ref 6–20)
CALCIUM: 9.4 mg/dL (ref 8.9–10.3)
CO2: 28 mmol/L (ref 22–32)
CREATININE: 0.96 mg/dL (ref 0.61–1.24)
Chloride: 100 mmol/L — ABNORMAL LOW (ref 101–111)
GFR calc Af Amer: >60 mL/min (ref >60)
GLUCOSE: 117 mg/dL — AB (ref 65–99)
Potassium: 4 mmol/L (ref 3.5–5.1)
Sodium: 138 mmol/L (ref 135–145)

## 2017-01-15 LAB — CBC
HCT: 39 % (ref 39.0–52.0)
HEMOGLOBIN: 12.8 g/dL — AB (ref 13.0–17.0)
MCH: 29.2 pg (ref 26.0–34.0)
MCHC: 32.8 g/dL (ref 30.0–36.0)
MCV: 89 fL (ref 78.0–100.0)
Platelets: 248 10*3/uL (ref 150–400)
RBC: 4.38 MIL/uL (ref 4.22–5.81)
RDW: 13.1 % (ref 11.5–15.5)
WBC: 7.1 10*3/uL (ref 4.0–10.5)

## 2017-01-15 LAB — SURGICAL PCR SCREEN
MRSA, PCR: NEGATIVE
STAPHYLOCOCCUS AUREUS: NEGATIVE

## 2017-01-15 LAB — GLUCOSE, CAPILLARY: GLUCOSE-CAPILLARY: 163 mg/dL — AB (ref 65–99)

## 2017-01-15 NOTE — Progress Notes (Signed)
Requested copy of Stress test,EKG and ov notes from Dr. Irven Shelling office

## 2017-01-16 LAB — HEMOGLOBIN A1C
Hgb A1c MFr Bld: 6.4 % — ABNORMAL HIGH (ref 4.8–5.6)
Mean Plasma Glucose: 137 mg/dL

## 2017-01-21 ENCOUNTER — Inpatient Hospital Stay (HOSPITAL_COMMUNITY)
Admission: RE | Admit: 2017-01-21 | Discharge: 2017-01-26 | DRG: 519 | Disposition: A | Discharge status: Rehabilitation Facility | Payer: Medicare HMO | Source: Ambulatory Visit | Attending: Orthopedic Surgery | Admitting: Orthopedic Surgery

## 2017-01-21 ENCOUNTER — Inpatient Hospital Stay (HOSPITAL_COMMUNITY): Payer: Medicare HMO | Admitting: Certified Registered Nurse Anesthetist

## 2017-01-21 ENCOUNTER — Inpatient Hospital Stay (HOSPITAL_COMMUNITY): Payer: Medicare HMO

## 2017-01-21 ENCOUNTER — Inpatient Hospital Stay (HOSPITAL_COMMUNITY)
Admission: RE | Disposition: A | Discharge status: Rehabilitation Facility | Payer: Self-pay | Source: Ambulatory Visit | Attending: Orthopedic Surgery

## 2017-01-21 ENCOUNTER — Encounter (HOSPITAL_COMMUNITY): Payer: Self-pay | Admitting: Certified Registered Nurse Anesthetist

## 2017-01-21 DIAGNOSIS — E1165 Type 2 diabetes mellitus with hyperglycemia: Secondary | ICD-10-CM | POA: Diagnosis not present

## 2017-01-21 DIAGNOSIS — R269 Unspecified abnormalities of gait and mobility: Secondary | ICD-10-CM | POA: Diagnosis not present

## 2017-01-21 DIAGNOSIS — R338 Other retention of urine: Secondary | ICD-10-CM | POA: Diagnosis not present

## 2017-01-21 DIAGNOSIS — Z794 Long term (current) use of insulin: Secondary | ICD-10-CM

## 2017-01-21 DIAGNOSIS — Z9889 Other specified postprocedural states: Secondary | ICD-10-CM | POA: Diagnosis not present

## 2017-01-21 DIAGNOSIS — K644 Residual hemorrhoidal skin tags: Secondary | ICD-10-CM | POA: Diagnosis not present

## 2017-01-21 DIAGNOSIS — Z6830 Body mass index (BMI) 30.0-30.9, adult: Secondary | ICD-10-CM

## 2017-01-21 DIAGNOSIS — M48062 Spinal stenosis, lumbar region with neurogenic claudication: Principal | ICD-10-CM | POA: Diagnosis present

## 2017-01-21 DIAGNOSIS — G834 Cauda equina syndrome: Secondary | ICD-10-CM | POA: Diagnosis not present

## 2017-01-21 DIAGNOSIS — K219 Gastro-esophageal reflux disease without esophagitis: Secondary | ICD-10-CM | POA: Diagnosis present

## 2017-01-21 DIAGNOSIS — I10 Essential (primary) hypertension: Secondary | ICD-10-CM | POA: Diagnosis present

## 2017-01-21 DIAGNOSIS — E1159 Type 2 diabetes mellitus with other circulatory complications: Secondary | ICD-10-CM

## 2017-01-21 DIAGNOSIS — K567 Ileus, unspecified: Secondary | ICD-10-CM | POA: Diagnosis not present

## 2017-01-21 DIAGNOSIS — D62 Acute posthemorrhagic anemia: Secondary | ICD-10-CM | POA: Diagnosis not present

## 2017-01-21 DIAGNOSIS — E119 Type 2 diabetes mellitus without complications: Secondary | ICD-10-CM | POA: Diagnosis present

## 2017-01-21 DIAGNOSIS — E669 Obesity, unspecified: Secondary | ICD-10-CM

## 2017-01-21 DIAGNOSIS — G894 Chronic pain syndrome: Secondary | ICD-10-CM | POA: Diagnosis present

## 2017-01-21 DIAGNOSIS — S064X9A Epidural hemorrhage with loss of consciousness of unspecified duration, initial encounter: Secondary | ICD-10-CM | POA: Diagnosis not present

## 2017-01-21 DIAGNOSIS — G9761 Postprocedural hematoma of a nervous system organ or structure following a nervous system procedure: Secondary | ICD-10-CM | POA: Diagnosis not present

## 2017-01-21 DIAGNOSIS — Z981 Arthrodesis status: Secondary | ICD-10-CM | POA: Diagnosis not present

## 2017-01-21 DIAGNOSIS — M2578 Osteophyte, vertebrae: Secondary | ICD-10-CM | POA: Diagnosis present

## 2017-01-21 DIAGNOSIS — F1010 Alcohol abuse, uncomplicated: Secondary | ICD-10-CM | POA: Diagnosis present

## 2017-01-21 DIAGNOSIS — F418 Other specified anxiety disorders: Secondary | ICD-10-CM | POA: Diagnosis present

## 2017-01-21 DIAGNOSIS — E1169 Type 2 diabetes mellitus with other specified complication: Secondary | ICD-10-CM

## 2017-01-21 DIAGNOSIS — E785 Hyperlipidemia, unspecified: Secondary | ICD-10-CM | POA: Diagnosis present

## 2017-01-21 DIAGNOSIS — E871 Hypo-osmolality and hyponatremia: Secondary | ICD-10-CM | POA: Diagnosis present

## 2017-01-21 DIAGNOSIS — R339 Retention of urine, unspecified: Secondary | ICD-10-CM | POA: Diagnosis not present

## 2017-01-21 DIAGNOSIS — K5900 Constipation, unspecified: Secondary | ICD-10-CM | POA: Diagnosis not present

## 2017-01-21 DIAGNOSIS — Y838 Other surgical procedures as the cause of abnormal reaction of the patient, or of later complication, without mention of misadventure at the time of the procedure: Secondary | ICD-10-CM | POA: Diagnosis not present

## 2017-01-21 DIAGNOSIS — S064XAA Epidural hemorrhage with loss of consciousness status unknown, initial encounter: Secondary | ICD-10-CM | POA: Diagnosis present

## 2017-01-21 DIAGNOSIS — D72829 Elevated white blood cell count, unspecified: Secondary | ICD-10-CM

## 2017-01-21 DIAGNOSIS — Z419 Encounter for procedure for purposes other than remedying health state, unspecified: Secondary | ICD-10-CM

## 2017-01-21 DIAGNOSIS — E1121 Type 2 diabetes mellitus with diabetic nephropathy: Secondary | ICD-10-CM | POA: Diagnosis not present

## 2017-01-21 DIAGNOSIS — K9189 Other postprocedural complications and disorders of digestive system: Secondary | ICD-10-CM | POA: Diagnosis not present

## 2017-01-21 HISTORY — PX: LUMBAR LAMINECTOMY/DECOMPRESSION MICRODISCECTOMY: SHX5026

## 2017-01-21 LAB — GLUCOSE, CAPILLARY
GLUCOSE-CAPILLARY: 144 mg/dL — AB (ref 65–99)
Glucose-Capillary: 133 mg/dL — ABNORMAL HIGH (ref 65–99)
Glucose-Capillary: 151 mg/dL — ABNORMAL HIGH (ref 65–99)

## 2017-01-21 SURGERY — LUMBAR LAMINECTOMY/DECOMPRESSION MICRODISCECTOMY 1 LEVEL
Anesthesia: General | Site: Back

## 2017-01-21 MED ORDER — DOCUSATE SODIUM 100 MG PO CAPS
100.0000 mg | ORAL_CAPSULE | Freq: Two times a day (BID) | ORAL | Status: DC
  Administered 2017-01-22 (×2): 100 mg via ORAL
  Filled 2017-01-21 (×3): qty 1

## 2017-01-21 MED ORDER — THROMBIN 20000 UNITS EX SOLR
CUTANEOUS | Status: AC
  Filled 2017-01-21: qty 20000

## 2017-01-21 MED ORDER — OXYCODONE HCL 5 MG/5ML PO SOLN: 5.0000 mg | Freq: Once | ORAL | Status: DC | PRN

## 2017-01-21 MED ORDER — BUPIVACAINE-EPINEPHRINE 0.25% -1:200000 IJ SOLN
INTRAMUSCULAR | Status: DC | PRN
Start: 2017-01-21 — End: 2017-01-21
  Administered 2017-01-21: 10 mL

## 2017-01-21 MED ORDER — POLYETHYLENE GLYCOL 3350 17 G PO PACK: 17.0000 g | PACK | Freq: Every day | ORAL | Status: DC | PRN

## 2017-01-21 MED ORDER — PHENOL 1.4 % MT LIQD
1.0000 | OROMUCOSAL | Status: DC | PRN
Start: 2017-01-21 — End: 2017-01-23

## 2017-01-21 MED ORDER — VANCOMYCIN HCL 10 G IV SOLR
1250.0000 mg | Freq: Once | INTRAVENOUS | Status: AC
  Administered 2017-01-22: 1250 mg via INTRAVENOUS
  Filled 2017-01-21: qty 1250

## 2017-01-21 MED ORDER — THROMBIN 20000 UNITS EX KIT
PACK | CUTANEOUS | Status: DC | PRN
  Administered 2017-01-21: 20000 [IU] via TOPICAL

## 2017-01-21 MED ORDER — PROMETHAZINE HCL 25 MG/ML IJ SOLN: 6.2500 mg | INTRAMUSCULAR | Status: DC | PRN

## 2017-01-21 MED ORDER — PROPOFOL 10 MG/ML IV BOLUS
INTRAVENOUS | Status: AC
  Filled 2017-01-21: qty 20

## 2017-01-21 MED ORDER — SUGAMMADEX SODIUM 200 MG/2ML IV SOLN
INTRAVENOUS | Status: AC
  Filled 2017-01-21: qty 2

## 2017-01-21 MED ORDER — ONDANSETRON HCL 4 MG/2ML IJ SOLN
INTRAMUSCULAR | Status: AC
  Filled 2017-01-21: qty 2

## 2017-01-21 MED ORDER — OXYCODONE HCL 5 MG PO TABS
ORAL_TABLET | ORAL | Status: AC
  Filled 2017-01-21: qty 2

## 2017-01-21 MED ORDER — MAGNESIUM CITRATE PO SOLN
1.0000 | Freq: Once | ORAL | Status: AC | PRN
  Administered 2017-01-22: 1 via ORAL
  Filled 2017-01-21: qty 296

## 2017-01-21 MED ORDER — ONDANSETRON HCL 4 MG/2ML IJ SOLN
INTRAMUSCULAR | Status: DC | PRN
  Administered 2017-01-21: 4 mg via INTRAVENOUS

## 2017-01-21 MED ORDER — MORPHINE SULFATE (PF) 2 MG/ML IV SOLN
2.0000 mg | INTRAVENOUS | Status: DC | PRN
  Administered 2017-01-21: 2 mg via INTRAVENOUS

## 2017-01-21 MED ORDER — ACETAMINOPHEN 325 MG PO TABS
650.0000 mg | ORAL_TABLET | ORAL | Status: DC | PRN
  Administered 2017-01-21 – 2017-01-22 (×3): 650 mg via ORAL
  Filled 2017-01-21 (×2): qty 2

## 2017-01-21 MED ORDER — METHOCARBAMOL 500 MG PO TABS: 500.0000 mg | ORAL_TABLET | Freq: Three times a day (TID) | ORAL | 0 refills | Status: DC

## 2017-01-21 MED ORDER — VANCOMYCIN HCL IN DEXTROSE 1-5 GM/200ML-% IV SOLN
1000.0000 mg | INTRAVENOUS | Status: AC
  Administered 2017-01-21: 1000 mg via INTRAVENOUS
  Filled 2017-01-21: qty 200

## 2017-01-21 MED ORDER — OXYCODONE HCL 5 MG PO TABS: 5.0000 mg | ORAL_TABLET | Freq: Once | ORAL | Status: DC | PRN

## 2017-01-21 MED ORDER — MORPHINE SULFATE (PF) 4 MG/ML IV SOLN
INTRAVENOUS | Status: AC
  Administered 2017-01-21: 2 mg
  Filled 2017-01-21: qty 1

## 2017-01-21 MED ORDER — OXYCODONE HCL 5 MG PO TABS
ORAL_TABLET | ORAL | Status: AC
  Administered 2017-01-21: 10 mg via ORAL
  Filled 2017-01-21: qty 2

## 2017-01-21 MED ORDER — BUPIVACAINE-EPINEPHRINE (PF) 0.25% -1:200000 IJ SOLN
INTRAMUSCULAR | Status: AC
  Filled 2017-01-21: qty 30

## 2017-01-21 MED ORDER — LACTATED RINGERS IV SOLN
INTRAVENOUS | Status: DC
  Administered 2017-01-21: 10:00:00 via INTRAVENOUS

## 2017-01-21 MED ORDER — ONDANSETRON HCL 4 MG PO TABS: 4.0000 mg | ORAL_TABLET | Freq: Four times a day (QID) | ORAL | Status: DC | PRN

## 2017-01-21 MED ORDER — HYDROMORPHONE HCL 1 MG/ML IJ SOLN
0.2500 mg | INTRAMUSCULAR | Status: DC | PRN
  Administered 2017-01-21 (×4): 0.5 mg via INTRAVENOUS

## 2017-01-21 MED ORDER — ACETAMINOPHEN 650 MG RE SUPP: 650.0000 mg | RECTAL | Status: DC | PRN

## 2017-01-21 MED ORDER — LIDOCAINE 2% (20 MG/ML) 5 ML SYRINGE
INTRAMUSCULAR | Status: AC
  Filled 2017-01-21: qty 5

## 2017-01-21 MED ORDER — MORPHINE SULFATE (PF) 4 MG/ML IV SOLN: 2.0000 mg | INTRAVENOUS | Status: DC | PRN

## 2017-01-21 MED ORDER — PROPOFOL 10 MG/ML IV BOLUS
INTRAVENOUS | Status: DC | PRN
  Administered 2017-01-21: 150 mg via INTRAVENOUS
  Administered 2017-01-21: 50 mg via INTRAVENOUS

## 2017-01-21 MED ORDER — DEXTROSE 5 % IV SOLN
500.0000 mg | Freq: Four times a day (QID) | INTRAVENOUS | Status: DC | PRN
  Filled 2017-01-21: qty 5

## 2017-01-21 MED ORDER — SUGAMMADEX SODIUM 200 MG/2ML IV SOLN
INTRAVENOUS | Status: DC | PRN
  Administered 2017-01-21: 200 mg via INTRAVENOUS

## 2017-01-21 MED ORDER — FENTANYL CITRATE (PF) 250 MCG/5ML IJ SOLN
INTRAMUSCULAR | Status: DC | PRN
  Administered 2017-01-21: 50 ug via INTRAVENOUS
  Administered 2017-01-21: 150 ug via INTRAVENOUS
  Administered 2017-01-21: 50 ug via INTRAVENOUS

## 2017-01-21 MED ORDER — MORPHINE SULFATE (PF) 4 MG/ML IV SOLN
INTRAVENOUS | Status: AC
  Filled 2017-01-21: qty 1

## 2017-01-21 MED ORDER — OXYCODONE-ACETAMINOPHEN 10-325 MG PO TABS: 1.0000 | ORAL_TABLET | ORAL | 0 refills | Status: DC | PRN

## 2017-01-21 MED ORDER — OXYCODONE HCL 5 MG PO TABS
5.0000 mg | ORAL_TABLET | ORAL | Status: DC | PRN
  Administered 2017-01-21 – 2017-01-23 (×9): 10 mg via ORAL
  Filled 2017-01-21 (×7): qty 2

## 2017-01-21 MED ORDER — ACETAMINOPHEN 325 MG PO TABS
ORAL_TABLET | ORAL | Status: AC
  Filled 2017-01-21: qty 2

## 2017-01-21 MED ORDER — FENTANYL CITRATE (PF) 250 MCG/5ML IJ SOLN
INTRAMUSCULAR | Status: AC
  Filled 2017-01-21: qty 5

## 2017-01-21 MED ORDER — LIDOCAINE 2% (20 MG/ML) 5 ML SYRINGE
INTRAMUSCULAR | Status: DC | PRN
  Administered 2017-01-21: 100 mg via INTRAVENOUS

## 2017-01-21 MED ORDER — ONDANSETRON 4 MG PO TBDP: 4.0000 mg | ORAL_TABLET | Freq: Three times a day (TID) | ORAL | 0 refills | Status: DC | PRN

## 2017-01-21 MED ORDER — ROCURONIUM BROMIDE 10 MG/ML (PF) SYRINGE
PREFILLED_SYRINGE | INTRAVENOUS | Status: DC | PRN
  Administered 2017-01-21 (×2): 20 mg via INTRAVENOUS
  Administered 2017-01-21: 30 mg via INTRAVENOUS
  Administered 2017-01-21: 20 mg via INTRAVENOUS

## 2017-01-21 MED ORDER — METHOCARBAMOL 500 MG PO TABS
500.0000 mg | ORAL_TABLET | Freq: Four times a day (QID) | ORAL | Status: DC | PRN
  Administered 2017-01-21 – 2017-01-23 (×5): 500 mg via ORAL
  Filled 2017-01-21 (×4): qty 1

## 2017-01-21 MED ORDER — MENTHOL 3 MG MT LOZG
1.0000 | LOZENGE | OROMUCOSAL | Status: DC | PRN
Start: 2017-01-21 — End: 2017-01-23

## 2017-01-21 MED ORDER — 0.9 % SODIUM CHLORIDE (POUR BTL) OPTIME
TOPICAL | Status: DC | PRN
  Administered 2017-01-21: 1000 mL

## 2017-01-21 MED ORDER — HEMOSTATIC AGENTS (NO CHARGE) OPTIME
TOPICAL | Status: DC | PRN
  Administered 2017-01-21 (×2): 1 via TOPICAL

## 2017-01-21 MED ORDER — PHENYLEPHRINE HCL 10 MG/ML IJ SOLN
INTRAVENOUS | Status: DC | PRN
  Administered 2017-01-21: 20 ug/min via INTRAVENOUS

## 2017-01-21 MED ORDER — HYDROMORPHONE HCL 1 MG/ML IJ SOLN
INTRAMUSCULAR | Status: AC
  Administered 2017-01-21: 0.5 mg via INTRAVENOUS
  Filled 2017-01-21: qty 1

## 2017-01-21 MED ORDER — ROCURONIUM BROMIDE 10 MG/ML (PF) SYRINGE
PREFILLED_SYRINGE | INTRAVENOUS | Status: AC
  Filled 2017-01-21: qty 10

## 2017-01-21 MED ORDER — ONDANSETRON HCL 4 MG/2ML IJ SOLN: 4.0000 mg | Freq: Four times a day (QID) | INTRAMUSCULAR | Status: DC | PRN

## 2017-01-21 MED ORDER — METHOCARBAMOL 500 MG PO TABS
ORAL_TABLET | ORAL | Status: AC
  Administered 2017-01-21: 500 mg via ORAL
  Filled 2017-01-21: qty 1

## 2017-01-21 SURGICAL SUPPLY — 71 items
AGENT HMST MTR 8 SURGIFLO (HEMOSTASIS) ×1
BUR EGG ELITE 4.0 (BURR) IMPLANT
BUR EGG ELITE 4.0MM (BURR)
BUR MATCHSTICK NEURO 3.0 LAGG (BURR) IMPLANT
CANISTER SUCT 3000ML PPV (MISCELLANEOUS) ×3 IMPLANT
CLOSURE STERI-STRIP 1/2X4 (GAUZE/BANDAGES/DRESSINGS) ×1
CLOSURE WOUND 1/2 X4 (GAUZE/BANDAGES/DRESSINGS) ×1
CLSR STERI-STRIP ANTIMIC 1/2X4 (GAUZE/BANDAGES/DRESSINGS) ×2 IMPLANT
CORD BI POLAR (MISCELLANEOUS) ×3 IMPLANT
COVER SURGICAL LIGHT HANDLE (MISCELLANEOUS) ×3 IMPLANT
DRAIN CHANNEL 15F RND FF W/TCR (WOUND CARE) IMPLANT
DRAPE POUCH INSTRU U-SHP 10X18 (DRAPES) ×3 IMPLANT
DRAPE SURG 17X23 STRL (DRAPES) ×3 IMPLANT
DRAPE U-SHAPE 47X51 STRL (DRAPES) ×3 IMPLANT
DRSG AQUACEL AG ADV 3.5X 4 (GAUZE/BANDAGES/DRESSINGS) IMPLANT
DRSG AQUACEL AG ADV 3.5X 6 (GAUZE/BANDAGES/DRESSINGS) IMPLANT
DRSG OPSITE POSTOP 4X6 (GAUZE/BANDAGES/DRESSINGS) ×2 IMPLANT
DURAPREP 26ML APPLICATOR (WOUND CARE) ×3 IMPLANT
ELECT BLADE 4.0 EZ CLEAN MEGAD (MISCELLANEOUS) ×3
ELECT CAUTERY BLADE 6.4 (BLADE) ×3 IMPLANT
ELECT PENCIL ROCKER SW 15FT (MISCELLANEOUS) ×3 IMPLANT
ELECT REM PT RETURN 9FT ADLT (ELECTROSURGICAL) ×3
ELECTRODE BLDE 4.0 EZ CLN MEGD (MISCELLANEOUS) ×1 IMPLANT
ELECTRODE REM PT RTRN 9FT ADLT (ELECTROSURGICAL) ×1 IMPLANT
EVACUATOR SILICONE 100CC (DRAIN) IMPLANT
GLOVE BIO SURGEON STRL SZ 6.5 (GLOVE) ×3 IMPLANT
GLOVE BIO SURGEONS STRL SZ 6.5 (GLOVE) ×2
GLOVE BIOGEL PI IND STRL 6.5 (GLOVE) ×1 IMPLANT
GLOVE BIOGEL PI IND STRL 8.5 (GLOVE) ×1 IMPLANT
GLOVE BIOGEL PI INDICATOR 6.5 (GLOVE) ×2
GLOVE BIOGEL PI INDICATOR 8.5 (GLOVE) ×2
GLOVE SS BIOGEL STRL SZ 8.5 (GLOVE) ×1 IMPLANT
GLOVE SUPERSENSE BIOGEL SZ 8.5 (GLOVE) ×2
GOWN STRL REUS W/TWL 2XL LVL3 (GOWN DISPOSABLE) ×6 IMPLANT
KIT BASIN OR (CUSTOM PROCEDURE TRAY) ×3 IMPLANT
KIT ROOM TURNOVER OR (KITS) ×3 IMPLANT
NDL SPNL 18GX3.5 QUINCKE PK (NEEDLE) ×2 IMPLANT
NEEDLE 22X1 1/2 (OR ONLY) (NEEDLE) ×3 IMPLANT
NEEDLE SPNL 18GX3.5 QUINCKE PK (NEEDLE) ×6 IMPLANT
NS IRRIG 1000ML POUR BTL (IV SOLUTION) ×6 IMPLANT
PACK LAMINECTOMY ORTHO (CUSTOM PROCEDURE TRAY) ×3 IMPLANT
PACK UNIVERSAL I (CUSTOM PROCEDURE TRAY) ×3 IMPLANT
PAD ARMBOARD 7.5X6 YLW CONV (MISCELLANEOUS) ×6 IMPLANT
PATTIES SURGICAL .5 X.5 (GAUZE/BANDAGES/DRESSINGS) IMPLANT
PATTIES SURGICAL .5 X1 (DISPOSABLE) ×3 IMPLANT
SPOGE SURGIFLO 8M (HEMOSTASIS) ×2
SPONGE SURGIFLO 8M (HEMOSTASIS) IMPLANT
SPONGE SURGIFOAM ABS GEL 100 (HEMOSTASIS) ×2 IMPLANT
STRIP CLOSURE SKIN 1/2X4 (GAUZE/BANDAGES/DRESSINGS) ×1 IMPLANT
SUT BONE WAX W31G (SUTURE) ×3 IMPLANT
SUT MNCRL AB 3-0 PS2 18 (SUTURE) ×3 IMPLANT
SUT MON AB 3-0 SH 27 (SUTURE)
SUT MON AB 3-0 SH27 (SUTURE) IMPLANT
SUT STRATAFIX 1PDS 45CM VIOLET (SUTURE) IMPLANT
SUT STRATAFIX MNCRL+ 3-0 PS-2 (SUTURE)
SUT STRATAFIX MONOCRYL 3-0 (SUTURE)
SUT STRATAFIX SPIRAL + 2-0 (SUTURE) IMPLANT
SUT VIC AB 0 CT1 27 (SUTURE) ×3
SUT VIC AB 0 CT1 27XBRD ANBCTR (SUTURE) ×1 IMPLANT
SUT VIC AB 1 CT1 18XCR BRD 8 (SUTURE) ×1 IMPLANT
SUT VIC AB 1 CT1 8-18 (SUTURE) ×3
SUT VIC AB 1 CTX 36 (SUTURE) ×3
SUT VIC AB 1 CTX36XBRD ANBCTR (SUTURE) IMPLANT
SUT VIC AB 2-0 CT1 18 (SUTURE) ×2 IMPLANT
SUTURE STRATFX MNCRL+ 3-0 PS-2 (SUTURE) IMPLANT
SYR BULB IRRIGATION 50ML (SYRINGE) ×3 IMPLANT
SYR CONTROL 10ML LL (SYRINGE) ×3 IMPLANT
TOWEL OR 17X24 6PK STRL BLUE (TOWEL DISPOSABLE) ×3 IMPLANT
TOWEL OR 17X26 10 PK STRL BLUE (TOWEL DISPOSABLE) ×3 IMPLANT
WATER STERILE IRR 1000ML POUR (IV SOLUTION) ×1 IMPLANT
YANKAUER SUCT BULB TIP NO VENT (SUCTIONS) ×3 IMPLANT

## 2017-01-21 NOTE — Anesthesia Preprocedure Evaluation (Signed)
Anesthesia Evaluation  Patient identified by MRN, date of birth, ID band Patient awake    Reviewed: Allergy & Precautions, NPO status , Patient's Chart, lab work & pertinent test results  Airway Mallampati: II  TM Distance: >3 FB Neck ROM: Full    Dental  (+) Edentulous Upper, Edentulous Lower   Pulmonary former smoker,    Pulmonary exam normal breath sounds clear to auscultation       Cardiovascular hypertension, Pt. 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 There was no ST segment deviation noted during stress. This is a low risk study. Donald Calderon is no evidence of ischemia. Defect 1: There is a small defect of mild severity present in the basal inferior location. 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   Sees cardiologist   Neuro/Psych PSYCHIATRIC DISORDERS Anxiety Depression negative neurological ROS     GI/Hepatic negative GI ROS, Neg liver ROS,   Endo/Other  diabetes, Insulin Dependent  Renal/GU negative Renal ROS     Musculoskeletal negative musculoskeletal ROS (+)   Abdominal   Peds  Hematology negative hematology ROS (+)   Anesthesia Other Findings Hyperlipidemia   Reproductive/Obstetrics                             Anesthesia Physical Anesthesia Plan  ASA: III  Anesthesia Plan: General   Post-op Pain Management:    Induction: Intravenous  PONV Risk Score and Plan: 2 and Ondansetron and Dexamethasone  Airway Management Planned: Oral ETT  Additional Equipment:   Intra-op Plan:   Post-operative Plan: Extubation in OR  Informed Consent: I have reviewed the patients History and Physical, chart, labs and discussed the procedure including the risks, benefits and  alternatives for the proposed anesthesia with the patient or authorized representative who has indicated his/her understanding and acceptance.   Dental advisory given  Plan Discussed with: CRNA  Anesthesia Plan Comments:         Anesthesia Quick Evaluation

## 2017-01-21 NOTE — Progress Notes (Signed)
Pharmacy Antibiotic Note  Donald Calderon is a 76 y.o. male s/p spinal procedure.  Pharmacy has been consulted for vancomycin dosing. No drain in place -1000mg  IV given at 11:30am -CrCl ~ 75  Plan: -vancomcyin 1250mg  IV x1 at 11:30pm -Will sign off. Please contact pharmacy with any other needs.  Thank you for allowing pharmacy to be a part of this patient's care.  Hildred Laser, Pharm D 01/21/2017 10:21 PM      Weight: 208 lb 6.4 oz (94.5 kg)  Temp (24hrs), Avg:98 F (36.7 C), Min:98 F (36.7 C), Max:98 F (36.7 C)   Recent Labs Lab 01/15/17 0952  WBC 7.1  CREATININE 0.96    Estimated Creatinine Clearance: 75.4 mL/min (by C-G formula based on SCr of 0.96 mg/dL).    Allergies  Allergen Reactions  . Penicillins Itching and Rash    Has patient had a PCN reaction causing immediate rash, facial/tongue/throat swelling, SOB or lightheadedness with hypotension: Yes Has patient had a PCN reaction causing severe rash involving mucus membranes or skin necrosis: No Has patient had a PCN reaction that required hospitalization: No Has patient had a PCN reaction occurring within the last 10 years: No If all of the above answers are "NO", then may proceed with Cephalosporin use.

## 2017-01-21 NOTE — Anesthesia Procedure Notes (Signed)
Procedure Name: Intubation Date/Time: 01/21/2017 11:30 AM Performed by: Julieta Bellini Pre-anesthesia Checklist: Patient identified, Emergency Drugs available, Suction available and Patient being monitored Patient Re-evaluated:Patient Re-evaluated prior to induction Oxygen Delivery Method: Circle system utilized Preoxygenation: Pre-oxygenation with 100% oxygen Induction Type: IV induction Ventilation: Mask ventilation with difficulty, Oral airway inserted - appropriate to patient size and Two handed mask ventilation required Laryngoscope Size: Mac and 4 Grade View: Grade I Tube type: Oral Tube size: 7.5 mm Number of attempts: 1 Airway Equipment and Method: Stylet Placement Confirmation: ETT inserted through vocal cords under direct vision,  positive ETCO2 and breath sounds checked- equal and bilateral Secured at: 23 cm Tube secured with: Tape Dental Injury: Teeth and Oropharynx as per pre-operative assessment

## 2017-01-21 NOTE — Brief Op Note (Signed)
01/21/2017  1:39 PM  PATIENT:  Donald Calderon  76 y.o. 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  PROCEDURE:  Procedure(s) with comments: Decompression Lumbar 2-3 (N/A) - 210 mins  SURGEON:  Surgeon(s) and Role:    Melina Schools, MD - Primary  PHYSICIAN ASSISTANT:   ASSISTANTS: carmen mayo   ANESTHESIA:   general  EBL:  Total I/O In: 1000 [I.V.:1000] Out: 180 [Urine:130; Blood:50]  BLOOD ADMINISTERED:none  DRAINS: none   LOCAL MEDICATIONS USED:  MARCAINE     SPECIMEN:  No Specimen  DISPOSITION OF SPECIMEN:  N/A  COUNTS:  YES  TOURNIQUET:  * No tourniquets in log *  DICTATION: .Dragon Dictation  PLAN OF CARE: Admit for overnight observation  PATIENT DISPOSITION:  PACU - hemodynamically stable.

## 2017-01-21 NOTE — H&P (Signed)
History of Present Illness  The patient is a 76 year old male who comes in today for a preoperative History and Physical. The patient is scheduled for a L3-4, L2-3 Decompression to be performed by Dr. Duane Lope D. Rolena Infante, MD at [facility] on 01/21/2017 . Please see the hospital record for complete dictated history and physical. pt reports DM2. he recalls his last A1c was 6. Pt has a fusion surgery 6 years ago.  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 Tobacco / smoke exposure  None. Children  5 or more Current drinker  09/18/2016: Currently drinks beer less than 5 times per week Current work status  retired Exercise  Exercises rarely; does running / walking Living situation  live with spouse Marital status  married No history of drug/alcohol rehab  Not under pain contract  Number of flights of stairs before winded  1  Medication History AmLODIPine Besylate (10MG  Tablet, Oral) Active. (qd) Levemir (100UNIT/ML Solution, Subcutaneous) Active. (1/2 bid) Lisinopril-Hydrochlorothiazide (20-25MG  Tablet, Oral) Active. Lyrica (100MG  Capsule, Oral) Active. (qd) NovoLOG (100UNIT/ML Solution, Subcutaneous) Active. (slding scale prn) Medications Reconciled  Vitals  01/12/2017 1:34 PM Weight: 218 lb Height: 70in Body Surface Area: 2.17 m Body Mass Index: 31.28 kg/m  Temp.: 98.39F  Pulse: 71 (Regular)  BP: 133/89 (Sitting, Right Arm, Standard)  General General Appearance-Not in acute  distress. Orientation-Oriented X3. Build & Nutrition-Well nourished and Well developed.  Integumentary General Characteristics Surgical Scars - no surgical scar evidence of previous lumbar surgery. Lumbar Spine-Skin examination of the lumbar spine is without deformity, skin lesions, lacerations or abrasions.  Chest and Lung Exam Auscultation Breath sounds - Normal and Clear.  Cardiovascular Auscultation Rhythm - Regular rate and rhythm.  Abdomen Palpation/Percussion Palpation and Percussion of the abdomen reveal - Soft, Non Tender and No Rebound tenderness.  Peripheral Vascular Lower Extremity Palpation - Posterior tibial pulse - Bilateral - 2+. Dorsalis pedis pulse - Bilateral - 2+.  Neurologic Sensation Lower Extremity - Bilateral - sensation is intact in the lower extremity. Reflexes Patellar Reflex - Bilateral - 2+. Achilles Reflex - Bilateral - 2+. Testing Seated Straight Leg Raise - Bilateral - Seated straight leg raise negative.  Musculoskeletal Spine/Ribs/Pelvis  Lumbosacral Spine: Inspection and Palpation - Tenderness - left lumbar paraspinals tender to palpation and right lumbar paraspinals tender to palpation. Strength and Tone: Strength - Hip Flexion - Bilateral - 5/5. Knee Extension - Bilateral - 5/5. Knee Flexion - Bilateral - 5/5. Ankle Dorsiflexion - Bilateral - 5/5. Ankle Plantarflexion - Bilateral - 5/5. Heel walk - Bilateral - unable to heel walk. Toe Walk - Bilateral - unable to walk on toes. ROM - Flexion - moderately decreased range of motion. Extension - moderately decreased range of motion and painful. Left Lateral Bending - moderately decreased range of motion and painful. Right Lateral Bending - moderately decreased range of motion and painful. Pain - neither flexion or extension is more painful than the other. Lumbosacral Spine - Waddell's Signs - no Waddell's signs present. Lower Extremity Range of Motion - No true hip, knee or ankle pain with  range of motion. Gait and  Station Furniture conservator/restorer - cane.  RADIOGRAPHS The patient's MRI from 11/06/16 is significant s/p posterior interbody fusion at L3-4, right L4-5 laminotomy. Moderate disc protrusion is present at L2-3. Ligamentum flavum hypertrophy, facet disease contributing tosevere spinal canal stenosis. L3-4 moderate sized left sided disc osteophyte noted, this contacts the L3 nerve. L4-5 moderate sized left sided disc osteophyte . Severe left and moderate right facet disease.  Assessment & Plan  Posterior Lumbar Decompression/disectomy: Risks of surgery include infection, bleeding, nerve damage, death, stroke, paralysis, failure to heal, need for further surgery, ongoing or worse pain, need for further surgery, CSF leak, loss of bowel or bladder, and recurrent disc herniation or Stenosis which would necessitate need for further surgery.  Goal Of Surgery: Discussed that goal of surgery is to reduce pain and improve function and quality of life. Patient is aware that despite all appropriate treatment that there pain and function could be the same, worse, or different.   I think a straightforward central and lateral recess decompression is all that is required with focus on that left side since that is primarily where his radicular/neuropathic pain is. We have gone over the risks which include infection, bleeding, nerve damage, death, stroke, paralysis, failure to heal, need for further surgery, ongoing or worst pain, leak of spinal fluid. All of his and his wife's questions were addressed. We will move forward with surgery in a timely fashion.

## 2017-01-21 NOTE — Op Note (Signed)
Operative report.  Preoperative diagnosis. Lumbar spinal stenosis (adjacent segment disease)  Postoperative diagnosis. Same.  Operative note. Lumbar decompression L2-3. Exploration of fusion L3-4.  Complications. 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  Indications. 76 year old on her previous L3-4 instrumented fusion. He's done well but recently developed severe back buttock and bilateral leg pain left side worse than the right. Imaging studies demonstrated adjacent segment L2-3 spinal stenosis that was severe. Patient's clinical exam was consistent with spinal stenosis with secondary neurogenic claudication. Attempts at conservative management fail to alleviate his symptoms and so we elected to proceed with surgery. Preoperatively my plan was to remove the hardware along with doing the decompression. I did tell him that if removing the hardware presented problems that we would abort that portion of the procedure.  Operative note patient was brought the operating room placed supine the operating room table. After successful induction of general anesthesia and endotracheal intubation teds SCDs and a Foley were inserted. He was turned prone onto the Wilson frame and all bony prominences were well-padded. The back was then prepped and draped in a standard fashion. Timeout was taken to confirm patient procedure and all other important data.  Identified the L3 pedicle screw with fluoroscopy and then marked out my incision site. I infiltrated this with quarter percent Marcaine and then made a midline incision. Sharp dissection was carried out down to the deep fascia. I incised the deep fascia and stripped the paraspinal muscles to expose the spinous process and lamina of L2 and the superior portion of that of L3. Graft I confirmed on fluoroscopy that I was at the appropriate level and then proceeded with my decompression. Double-action Leksell rongeur was used to remove the bulk of the L2 spinous process.  Neuro curette was used to dissect underneath thelamina and a 3 mm Kerrison was  used to perform a generous laminotomy of L2. The laminotomy was taken cranially up to the level of the L2 pedicle. This ensured adequate decompression of the affected area seen on the MRI. At this point the ligamentum flavum was now exposed this was significantly thickened and redundant and causing's significant pressure upon the thecal sac. I dissected through this with my Penfield 4 and then used my 2 and 3 mm Kerrison punch to resect the central ligamentum flavum and expose the thecal sac. I then went inferiorly and remove the superior portion of the L3 spinous process and lamina to adequately decompress the central area of the spinal canal. I then took my dissection into the lateral recess removing the overhanging osteophyte from the facet complex and completely decompressing the lateral recess. At this point I took repeat x-rays confirming that I decompression's band from the inferior aspect of the L2 pedicle to the mid to inferior aspect of the L3 pedicle. This area complete encompassed the area of maximum spinal stenosis seen on the preoperative MRI. At this point I could freely pass my [address] centrally and in the lateral recess and out the foramen without any significant problems.  With the decompression complete I then turned my attention to the removal of the hardware. I dissected out laterally. There was significant bony solid posterior lateral fusion. I ultimately was able to identify the L3 pedicle screw but it was angled such that I would may have to make a second incision to place the screw removal instrumentation system at this point I elected not to continue with the removal of the hardware. It would be technically difficult and Morton likely  I could result in greater back pain and a longer recovery time. There was no evidence that the hardware was compromising the adjacent facet nor was there any action or  other issues that would warrant removal of the hardware.  At this point I irrigated the wound copiously with normal saline and made sure hemostasis using bipolar cautery and FloSeal. Once I achieved hemostasis I then closed the deep fascia with interrupted #1 Vicryl sutures superficial with 2-0 Vicryl suture, and 3-0 Monocryl for the skin. Steri-Strips dry dressings were applied and the patient was ultimately extubated transferred the PACU without incident

## 2017-01-21 NOTE — Transfer of Care (Signed)
Immediate Anesthesia Transfer of Care Note  Patient: Donald Calderon  Procedure(s) Performed: Procedure(s) with comments: Decompression Lumbar 2-3 (N/A) - 210 mins  Patient Location: PACU  Anesthesia Type:General  Level of Consciousness: awake, alert , oriented and patient cooperative  Airway & Oxygen Therapy: Patient Spontanous Breathing and Patient connected to nasal cannula oxygen  Post-op Assessment: Report given to RN, Post -op Vital signs reviewed and stable and Patient moving all extremities X 4  Post vital signs: Reviewed and stable  Last Vitals:  Vitals:   01/21/17 1016  BP: (!) 160/87  Pulse: 74  Resp: 18  Temp: 36.7 C  SpO2: 99%    Last Pain:  Vitals:   01/21/17 1016  TempSrc: Oral  PainSc: 8       Patients Stated Pain Goal: 5 (37/54/36 0677)  Complications: No apparent anesthesia complications

## 2017-01-22 ENCOUNTER — Encounter (HOSPITAL_COMMUNITY): Payer: Self-pay | Admitting: Orthopedic Surgery

## 2017-01-22 LAB — GLUCOSE, CAPILLARY
Glucose-Capillary: 161 mg/dL — ABNORMAL HIGH (ref 65–99)
Glucose-Capillary: 106 mg/dL — ABNORMAL HIGH (ref 65–99)
Glucose-Capillary: 133 mg/dL — ABNORMAL HIGH (ref 65–99)

## 2017-01-22 MED ORDER — SODIUM CHLORIDE 0.9% FLUSH: 3.0000 mL | INTRAVENOUS | Status: DC | PRN

## 2017-01-22 MED ORDER — LACTATED RINGERS IV SOLN: INTRAVENOUS | Status: DC

## 2017-01-22 MED ORDER — SODIUM CHLORIDE 0.9% FLUSH: 3.0000 mL | Freq: Two times a day (BID) | INTRAVENOUS | Status: DC

## 2017-01-22 MED FILL — Thrombin For Soln 20000 Unit: CUTANEOUS | Qty: 1 | Status: AC

## 2017-01-22 NOTE — Progress Notes (Signed)
    Subjective: Procedure(s) (LRB): Decompression Lumbar 2-3 (N/A) 1 Day Post-Op  Patient reports pain as 2 on 0-10 scale.  Reports decreased leg pain reports incisional back pain   Foley just removed - monitor for spontaneous void Negative bowel movement Negative flatus Negative chest pain or shortness of breath  Objective: Vital signs in last 24 hours: Temp:  [98 F (36.7 C)-99.3 F (37.4 C)] 98.2 F (36.8 C) (08/10 0410) Pulse Rate:  [64-83] 77 (08/10 0410) Resp:  [10-26] 19 (08/10 0410) BP: (121-160)/(63-117) 121/63 (08/10 0410) SpO2:  [94 %-100 %] 97 % (08/10 0410) Weight:  [94.5 kg (208 lb 6.4 oz)] 94.5 kg (208 lb 6.4 oz) (08/09 1016)  Intake/Output from previous day: 08/09 0701 - 08/10 0700 In: 1200 [I.V.:1200] Out: 960 [Urine:910; Blood:50]  Labs: No results for input(s): WBC, RBC, HCT, PLT in the last 72 hours. No results for input(s): NA, K, CL, CO2, BUN, CREATININE, GLUCOSE, CALCIUM in the last 72 hours. No results for input(s): LABPT, INR in the last 72 hours.  Physical Exam: Neurologically intact ABD soft Intact pulses distally Incision: dressing C/D/I and no drainage Compartment soft  Assessment/Plan: Patient stable  xrays n/a Continue mobilization with physical therapy Continue care  Advance diet Up with therapy  Plan on d/c today or in AM depending upon PT performance   Melina Schools, MD [place] [phone]

## 2017-01-22 NOTE — Care Management Note (Signed)
Case Management Note  Patient Details  Name: Donald Calderon MRN: [id number] Date of Birth: March 04, 1941  Subjective/Objective:     76 yr old male s/p L2-3 Lumbar laminectomy decompression.               Action/Plan: Case manager spoke with patient concerning discharge plan. Choice for [facility] was offered, patient refused Home Health services.    Expected Discharge Date:   (Pending)               Expected Discharge Plan:  Home/Self Care  In-House Referral:  NA  Discharge planning Services  CM Consult  Post Acute Care Choice:  Home Health Choice offered to:  Patient, Spouse  DME Arranged:  N/A DME Agency:  NA  HH Arranged:  Patient Refused [place] Agency:  NA  Status of Service:  Completed, signed off  If discussed at [place] of Stay Meetings, dates discussed:    Additional Comments:  Ninfa Meeker, RN 01/22/2017, 11:02 AM

## 2017-01-22 NOTE — Evaluation (Signed)
Physical Therapy Evaluation Patient Details Name: Donald Calderon MRN: [id number] DOB: [birth year] Today's Date: 01/22/2017   History of Present Illness  Pt is a 76 y/o male s/p L2-3 lumbar laminectomy/decompression. PMH includes HTN, DM, anxiety, and depression.   Clinical Impression  Patient is s/p above surgery resulting in the deficits listed below (see PT Problem List). PTA, pt was using RW for ambulation. Upon eval, pt extremely limited by pain, and weakness, as well as decreased balance. REquired min guard to min A for mobility this session. Recommending HHPT at d/c to increase safety and independence with functional mobility. Will have assist from wife at home and has all DME. Patient will benefit from skilled PT to increase their independence and safety with mobility (while adhering to their precautions) to allow discharge to the venue listed below.     Follow Up Recommendations Home health PT;Supervision/Assistance - 24 hour    Equipment Recommendations  None recommended by PT    Recommendations for Other Services       Precautions / Restrictions Precautions Precautions: Back Precaution Booklet Issued: Yes (comment) Required Braces or Orthoses: Spinal Brace Spinal Brace: Lumbar corset Restrictions Weight Bearing Restrictions: No      Mobility  Bed Mobility Overal bed mobility: Needs Assistance Bed Mobility: Rolling;Sidelying to Sit Rolling: Min guard Sidelying to sit: Min guard       General bed mobility comments: Min guard to ensure appropriate log roll technique.   Transfers Overall transfer level: Needs assistance Equipment used: Rolling walker (2 wheeled) Transfers: Sit to/from Stand Sit to Stand: Min assist         General transfer comment: Min A for steadying upon standing. Verbal cues for safe hand placement. Required extended time and effor. Verbal cues to power through BLE.   Ambulation/Gait Ambulation/Gait assistance: Min guard Ambulation  Distance (Feet): 50 Feet Assistive device: Rolling walker (2 wheeled) Gait Pattern/deviations: Step-through pattern;Decreased stride length;Step-to pattern;Trunk flexed Gait velocity: Decreased Gait velocity interpretation: Below normal speed for age/gender General Gait Details: Slow, guarded gait. Distance limited secondary to pain. Multiple cues for upright posture and LE sequencing with RW. Required cues for step through pattern during gait.   Stairs            Wheelchair Mobility    Modified Rankin (Stroke Patients Only)       Balance Overall balance assessment: Needs assistance Sitting-balance support: No upper extremity supported;Feet supported Sitting balance-Leahy Scale: Good     Standing balance support: Bilateral upper extremity supported;During functional activity Standing balance-Leahy Scale: Poor Standing balance comment: Reliant on UE support for balance.                              Pertinent Vitals/Pain Pain Assessment: Faces Faces Pain Scale: Hurts whole lot Pain Location: back  Pain Descriptors / Indicators: Aching;Operative site guarding;Sore Pain Intervention(s): Limited activity within patient's tolerance;Monitored during session;Repositioned    Home Living Family/patient expects to be discharged to:: Private residence Living Arrangements: Spouse/significant other Available Help at Discharge: Family;Available 24 hours/day Type of Home: House Home Access: Stairs to enter Entrance Stairs-Rails: Psychiatric nurse of Steps: 6 Home Layout: One level Home Equipment: Walker - 2 wheels;Cane - single point;Shower seat;Grab bars - tub/shower;Grab bars - toilet      Prior Function Level of Independence: Independent with assistive device(s)         Comments: Used RW at baseline      Hand Dominance  Extremity/Trunk Assessment   Upper Extremity Assessment Upper Extremity Assessment: Defer to OT evaluation     Lower Extremity Assessment Lower Extremity Assessment: RLE deficits/detail;LLE deficits/detail;Generalized weakness RLE Deficits / Details: Numbness reported into feet.  LLE Deficits / Details: Numbness reported into feet.    Cervical / Trunk Assessment Cervical / Trunk Assessment: Other exceptions Cervical / Trunk Exceptions: s/p back surgery.   Communication   Communication: No difficulties  Cognition Arousal/Alertness: Awake/alert Behavior During Therapy: WFL for tasks assessed/performed Overall Cognitive Status: Within Functional Limits for tasks assessed                                        General Comments General comments (skin integrity, edema, etc.): Pt's wife present throughout session. Encouraged continued mobility throughout the day and walking program for return home. Unable to attempt stair training this session secondary to pain     Exercises     Assessment/Plan    PT Assessment Patient needs continued PT services  PT Problem List Decreased strength;Decreased range of motion;Decreased activity tolerance;Decreased balance;Decreased mobility;Decreased knowledge of use of DME;Decreased knowledge of precautions;Pain       PT Treatment Interventions DME instruction;Gait training;Stair training;Functional mobility training;Therapeutic exercise;Therapeutic activities;Balance training;Neuromuscular re-education;Patient/family education    PT Goals (Current goals can be found in the Care Plan section)  Acute Rehab PT Goals Patient Stated Goal: to decrease pain  PT Goal Formulation: With patient Time For Goal Achievement: 01/29/17 Potential to Achieve Goals: Good    Frequency Min 5X/week   Barriers to discharge        Co-evaluation               AM-PAC PT "6 Clicks" Daily Activity  Outcome Measure Difficulty turning over in bed (including adjusting bedclothes, sheets and blankets)?: Total Difficulty moving from lying on back to  sitting on the side of the bed? : Total Difficulty sitting down on and standing up from a chair with arms (e.g., wheelchair, bedside commode, etc,.)?: Total Help needed moving to and from a bed to chair (including a wheelchair)?: A Little Help needed walking in hospital room?: A Little Help needed climbing 3-5 steps with a railing? : A Lot 6 Click Score: 11    End of Session Equipment Utilized During Treatment: Gait belt;Back brace Activity Tolerance: Patient limited by pain Patient left: in chair;with call bell/phone within reach;with family/visitor present Nurse Communication: Mobility status PT Visit Diagnosis: Other abnormalities of gait and mobility (R26.89);Pain Pain - part of body:  (back )    Time: 7510-2585 PT Time Calculation (min) (ACUTE ONLY): 20 min   Charges:   PT Evaluation $PT Eval Moderate Complexity: 1 Mod     PT G Codes:        Leighton Ruff, PT, DPT  Acute Rehabilitation Services  Pager: [phone]   Rudean Hitt 01/22/2017, 9:38 AM

## 2017-01-22 NOTE — Evaluation (Signed)
Occupational Therapy Evaluation Patient Details Name: Donald Calderon MRN: [id number] DOB: Jul 12, 1940 Today's Date: 01/22/2017    History of Present Illness Pt is a 76 y/o male s/p L2-3 lumbar laminectomy/decompression. PMH includes HTN, DM, anxiety, and depression.    Clinical Impression   Pt reports he was mod I with ADL PTA. Currently pt requires min assist for functional mobility and UB ADL in sitting, max assist for LB ADL. Pt limited this session by pain and weakness. Back, safety, and ADL education completed with pt and wife. Pt planning to d/c home with 24/7 supervision from family. Recommending HHOT for follow up to maximize independence and safety with ADL and functional mobility. Pt would benefit from continued skilled OT to address established goals.    Follow Up Recommendations  Home health OT;Supervision/Assistance - 24 hour    Equipment Recommendations  None recommended by OT    Recommendations for Other Services       Precautions / Restrictions Precautions Precautions: Back Precaution Booklet Issued: No Precaution Comments: Pt able to recall 2/3 back precautions at start of session Required Braces or Orthoses: Spinal Brace Spinal Brace: Lumbar corset Restrictions Weight Bearing Restrictions: No      Mobility Bed Mobility Overal bed mobility: Needs Assistance Bed Mobility: Rolling;Sidelying to Sit Rolling: Supervision Sidelying to sit: Min guard       General bed mobility comments: Cues for technique. Use of bed rail with HOB flat  Transfers Overall transfer level: Needs assistance Equipment used: Rolling walker (2 wheeled) Transfers: Sit to/from Stand Sit to Stand: Min assist         General transfer comment: Cues for hand placement, min assist to boost up and steady in standing.    Balance Overall balance assessment: Needs assistance Sitting-balance support: Feet supported;Single extremity supported Sitting balance-Leahy Scale: Fair      Standing balance support: Bilateral upper extremity supported;During functional activity Standing balance-Leahy Scale: Poor Standing balance comment: RW for support                           ADL either performed or assessed with clinical judgement   ADL Overall ADL's : Needs assistance/impaired Eating/Feeding: Set up;Sitting   Grooming: Min guard;Sitting   Upper Body Bathing: Minimal assistance;Sitting   Lower Body Bathing: Maximal assistance;Sit to/from stand   Upper Body Dressing : Minimal assistance;Sitting Upper Body Dressing Details (indicate cue type and reason): to don shirt and brace Lower Body Dressing: Maximal assistance;Sit to/from stand Lower Body Dressing Details (indicate cue type and reason): with wife assisting Toilet Transfer: Minimal assistance;Ambulation;RW Toilet Transfer Details (indicate cue type and reason): simulated by sit to stand from EOB with functional mobility in room   Toileting - Clothing Manipulation Details (indicate cue type and reason): Educated on proper technique for peri care without twisting and use of wet wipes   Tub/Shower Transfer Details (indicate cue type and reason): Recommend not performing tub transfer initially due to pain and poor standing balance; pt and wife report pt will sponge bathe initially Functional mobility during ADLs: Minimal assistance;Rolling walker General ADL Comments: Educated pt on maintaining back precautions during functional activities, keeping frequently used items at coutner top height, frequent mobility thoughout the day upon return home, log roll for bed mobility.     Vision         Perception     Praxis      Pertinent Vitals/Pain Pain Assessment: Faces Faces Pain Scale: Hurts whole lot  Pain Location: back, bil LEs Pain Descriptors / Indicators: Aching;Grimacing;Sore Pain Intervention(s): Monitored during session;Repositioned     Hand Dominance     Extremity/Trunk Assessment Upper  Extremity Assessment Upper Extremity Assessment: Generalized weakness (pt reports intermittent tremors)   Lower Extremity Assessment Lower Extremity Assessment: Defer to PT evaluation   Cervical / Trunk Assessment Cervical / Trunk Assessment: Other exceptions Cervical / Trunk Exceptions: s/p back surgery.    Communication Communication Communication: No difficulties   Cognition Arousal/Alertness: Awake/alert Behavior During Therapy: WFL for tasks assessed/performed Overall Cognitive Status: Within Functional Limits for tasks assessed                                     General Comments       Exercises     Shoulder Instructions      Home Living Family/patient expects to be discharged to:: Private residence Living Arrangements: Spouse/significant other Available Help at Discharge: Family;Available 24 hours/day Type of Home: House Home Access: Stairs to enter [employer] of Steps: 6 Entrance Stairs-Rails: Right;Left Home Layout: One level     Bathroom Shower/Tub: Tub/shower unit;Curtain   Bathroom Toilet: Handicapped height     Home Equipment: Environmental consultant - 2 wheels;Cane - single point;Shower seat;Grab bars - tub/shower;Grab bars - toilet          Prior Functioning/Environment Level of Independence: Independent with assistive device(s)        Comments: Used RW at baseline         OT Problem List: Decreased strength;Decreased activity tolerance;Impaired balance (sitting and/or standing);Decreased knowledge of use of DME or AE;Decreased knowledge of precautions;Obesity;Pain      OT Treatment/Interventions: Self-care/ADL training;Energy conservation;DME and/or AE instruction;Therapeutic activities;Patient/family education;Balance training    OT Goals(Current goals can be found in the care plan section) Acute Rehab OT Goals Patient Stated Goal: to decrease pain  OT Goal Formulation: With patient/family Time For Goal Achievement:  02/05/17 Potential to Achieve Goals: Good ADL Goals Pt Will Perform Lower Body Bathing: with supervision;sit to/from stand;with adaptive equipment Pt Will Perform Lower Body Dressing: with supervision;with adaptive equipment;sit to/from stand Pt Will Transfer to Toilet: with supervision;ambulating;bedside commode Pt Will Perform Toileting - Clothing Manipulation and hygiene: with supervision;sit to/from stand Pt Will Perform Tub/Shower Transfer: Tub transfer;with supervision;ambulating;shower seat;grab bars;rolling walker  OT Frequency: Min 2X/week   Barriers to D/C:            Co-evaluation              AM-PAC PT "6 Clicks" Daily Activity     Outcome Measure Help from another person eating meals?: A Little Help from another person taking care of personal grooming?: A Little Help from another person toileting, which includes using toliet, bedpan, or urinal?: A Little Help from another person bathing (including washing, rinsing, drying)?: A Lot Help from another person to put on and taking off regular upper body clothing?: A Little Help from another person to put on and taking off regular lower body clothing?: A Lot 6 Click Score: 16   End of Session Equipment Utilized During Treatment: Gait belt;Rolling walker;Back brace Nurse Communication: Mobility status;Other (comment) (equipment and f/u needs)  Activity Tolerance: Patient tolerated treatment well;Patient limited by pain Patient left: with call bell/phone within reach;with family/visitor present;Other (comment) (sitting EOB)  OT Visit Diagnosis: Unsteadiness on feet (R26.81);Other abnormalities of gait and mobility (R26.89);Muscle weakness (generalized) (M62.81);Pain Pain - part of body:  (  back)                Time: 1840-3754 OT Time Calculation (min): 28 min Charges:  OT General Charges $OT Visit: 1 Procedure OT Evaluation $OT Eval Moderate Complexity: 1 Procedure OT Treatments $Self Care/Home Management : 8-22  mins G-Codes:     Mel Almond A. Ulice Brilliant, M.S., OTR/L Pager: [place] 01/22/2017, 2:34 PM

## 2017-01-22 NOTE — Anesthesia Postprocedure Evaluation (Signed)
Anesthesia Post Note  Patient: Donald Calderon  Procedure(s) Performed: Procedure(s) (LRB): Decompression Lumbar 2-3 (N/A)     Patient location during evaluation: PACU Anesthesia Type: General Level of consciousness: awake and alert Pain management: pain level controlled Vital Signs Assessment: post-procedure vital signs reviewed and stable Respiratory status: spontaneous breathing, nonlabored ventilation, respiratory function stable and patient connected to nasal cannula oxygen Cardiovascular status: blood pressure returned to baseline and stable Postop Assessment: no signs of nausea or vomiting Anesthetic complications: no    Last Vitals:  Vitals:   01/22/17 0008 01/22/17 0410  BP: 140/73 121/63  Pulse: 79 77  Resp: 19 19  Temp: 37.4 C 36.8 C  SpO2: 97% 97%    Last Pain:  Vitals:   01/22/17 0410  TempSrc: Oral  PainSc:                  Donald Calderon

## 2017-01-23 ENCOUNTER — Encounter (HOSPITAL_COMMUNITY)
Admission: RE | Disposition: A | Discharge status: Rehabilitation Facility | Payer: Self-pay | Source: Ambulatory Visit | Attending: Orthopedic Surgery

## 2017-01-23 ENCOUNTER — Inpatient Hospital Stay (HOSPITAL_COMMUNITY): Payer: Medicare HMO | Admitting: Certified Registered Nurse Anesthetist

## 2017-01-23 ENCOUNTER — Inpatient Hospital Stay (HOSPITAL_COMMUNITY): Payer: Medicare HMO

## 2017-01-23 DIAGNOSIS — S064XAA Epidural hemorrhage with loss of consciousness status unknown, initial encounter: Secondary | ICD-10-CM | POA: Diagnosis present

## 2017-01-23 DIAGNOSIS — S064X9A Epidural hemorrhage with loss of consciousness of unspecified duration, initial encounter: Secondary | ICD-10-CM | POA: Diagnosis present

## 2017-01-23 HISTORY — PX: LUMBAR LAMINECTOMY/DECOMPRESSION MICRODISCECTOMY: SHX5026

## 2017-01-23 LAB — GLUCOSE, CAPILLARY
Glucose-Capillary: 138 mg/dL — ABNORMAL HIGH (ref 65–99)
Glucose-Capillary: 124 mg/dL — ABNORMAL HIGH (ref 65–99)
Glucose-Capillary: 147 mg/dL — ABNORMAL HIGH (ref 65–99)
Glucose-Capillary: 243 mg/dL — ABNORMAL HIGH (ref 65–99)

## 2017-01-23 LAB — CBC
HCT: 33.5 % — ABNORMAL LOW (ref 39.0–52.0)
Hemoglobin: 11 g/dL — ABNORMAL LOW (ref 13.0–17.0)
MCH: 29.9 pg (ref 26.0–34.0)
MCHC: 32.8 g/dL (ref 30.0–36.0)
MCV: 91 fL (ref 78.0–100.0)
Platelets: 222 10*3/uL (ref 150–400)
RBC: 3.68 MIL/uL — AB (ref 4.22–5.81)
RDW: 13.3 % (ref 11.5–15.5)
WBC: 11.1 10*3/uL — ABNORMAL HIGH (ref 4.0–10.5)

## 2017-01-23 LAB — BASIC METABOLIC PANEL
Anion gap: 10 (ref 5–15)
BUN: 11 mg/dL (ref 6–20)
CHLORIDE: 98 mmol/L — AB (ref 101–111)
CO2: 26 mmol/L (ref 22–32)
CREATININE: 1.1 mg/dL (ref 0.61–1.24)
Calcium: 8.8 mg/dL — ABNORMAL LOW (ref 8.9–10.3)
GFR calc Af Amer: >60 mL/min (ref >60)
GFR calc non Af Amer: >60 mL/min (ref >60)
GLUCOSE: 129 mg/dL — AB (ref 65–99)
Potassium: 3.8 mmol/L (ref 3.5–5.1)
Sodium: 134 mmol/L — ABNORMAL LOW (ref 135–145)

## 2017-01-23 SURGERY — LUMBAR LAMINECTOMY/DECOMPRESSION MICRODISCECTOMY
Anesthesia: General

## 2017-01-23 MED ORDER — VANCOMYCIN HCL IN DEXTROSE 1-5 GM/200ML-% IV SOLN
1000.0000 mg | Freq: Two times a day (BID) | INTRAVENOUS | Status: DC
  Administered 2017-01-23 – 2017-01-26 (×6): 1000 mg via INTRAVENOUS
  Filled 2017-01-23 (×6): qty 200

## 2017-01-23 MED ORDER — INSULIN DETEMIR 100 UNIT/ML ~~LOC~~ SOLN
10.0000 [IU] | Freq: Every day | SUBCUTANEOUS | Status: DC
  Administered 2017-01-24 – 2017-01-26 (×3): 10 [IU] via SUBCUTANEOUS
  Filled 2017-01-23 (×3): qty 0.1

## 2017-01-23 MED ORDER — BUPIVACAINE-EPINEPHRINE (PF) 0.25% -1:200000 IJ SOLN
INTRAMUSCULAR | Status: AC
  Filled 2017-01-23: qty 30

## 2017-01-23 MED ORDER — ACETAMINOPHEN 10 MG/ML IV SOLN
INTRAVENOUS | Status: AC
  Filled 2017-01-23: qty 100

## 2017-01-23 MED ORDER — LORAZEPAM 2 MG/ML IJ SOLN: 1.0000 mg | Freq: Once | INTRAMUSCULAR | Status: DC

## 2017-01-23 MED ORDER — ONDANSETRON HCL 4 MG PO TABS: 4.0000 mg | ORAL_TABLET | Freq: Four times a day (QID) | ORAL | Status: DC | PRN

## 2017-01-23 MED ORDER — HYDROMORPHONE HCL 1 MG/ML IJ SOLN: 0.2500 mg | INTRAMUSCULAR | Status: DC | PRN

## 2017-01-23 MED ORDER — MIDAZOLAM HCL 5 MG/5ML IJ SOLN
INTRAMUSCULAR | Status: DC | PRN
  Administered 2017-01-23 (×2): 1 mg via INTRAVENOUS

## 2017-01-23 MED ORDER — DEXAMETHASONE SODIUM PHOSPHATE 4 MG/ML IJ SOLN
INTRAMUSCULAR | Status: DC | PRN
  Administered 2017-01-23: 4 mg via INTRAVENOUS

## 2017-01-23 MED ORDER — SODIUM CHLORIDE 0.9% FLUSH
3.0000 mL | Freq: Two times a day (BID) | INTRAVENOUS | Status: DC
  Administered 2017-01-24 – 2017-01-26 (×3): 3 mL via INTRAVENOUS

## 2017-01-23 MED ORDER — MIDAZOLAM HCL 2 MG/2ML IJ SOLN
INTRAMUSCULAR | Status: AC
Start: 2017-01-23 — End: ?
  Filled 2017-01-23: qty 2

## 2017-01-23 MED ORDER — ALUM & MAG HYDROXIDE-SIMETH 200-200-20 MG/5ML PO SUSP
30.0000 mL | Freq: Four times a day (QID) | ORAL | Status: DC | PRN
  Administered 2017-01-24 – 2017-01-26 (×5): 30 mL via ORAL
  Filled 2017-01-23 (×5): qty 30

## 2017-01-23 MED ORDER — LIDOCAINE HCL (CARDIAC) 20 MG/ML IV SOLN
INTRAVENOUS | Status: DC | PRN
  Administered 2017-01-23: 100 mg via INTRAVENOUS

## 2017-01-23 MED ORDER — PROPOFOL 10 MG/ML IV BOLUS
INTRAVENOUS | Status: DC | PRN
  Administered 2017-01-23: 120 mg via INTRAVENOUS

## 2017-01-23 MED ORDER — 0.9 % SODIUM CHLORIDE (POUR BTL) OPTIME
TOPICAL | Status: DC | PRN
  Administered 2017-01-23 (×3): 1000 mL

## 2017-01-23 MED ORDER — ONDANSETRON HCL 4 MG/2ML IJ SOLN: 4.0000 mg | Freq: Once | INTRAMUSCULAR | Status: DC | PRN

## 2017-01-23 MED ORDER — FENTANYL CITRATE (PF) 100 MCG/2ML IJ SOLN
INTRAMUSCULAR | Status: DC | PRN
  Administered 2017-01-23 (×3): 50 ug via INTRAVENOUS
  Administered 2017-01-23: 100 ug via INTRAVENOUS

## 2017-01-23 MED ORDER — PHENYLEPHRINE HCL 10 MG/ML IJ SOLN
INTRAMUSCULAR | Status: DC | PRN
  Administered 2017-01-23 (×4): 80 ug via INTRAVENOUS

## 2017-01-23 MED ORDER — ACETAMINOPHEN 325 MG PO TABS: 650.0000 mg | ORAL_TABLET | ORAL | Status: DC | PRN

## 2017-01-23 MED ORDER — POLYETHYLENE GLYCOL 3350 17 G PO PACK: 17.0000 g | PACK | Freq: Every day | ORAL | Status: DC | PRN

## 2017-01-23 MED ORDER — SUGAMMADEX SODIUM 200 MG/2ML IV SOLN
INTRAVENOUS | Status: DC | PRN
  Administered 2017-01-23: 200 mg via INTRAVENOUS

## 2017-01-23 MED ORDER — ACETAMINOPHEN 10 MG/ML IV SOLN
1000.0000 mg | Freq: Once | INTRAVENOUS | Status: AC
  Administered 2017-01-23: 1000 mg via INTRAVENOUS
  Filled 2017-01-23: qty 100

## 2017-01-23 MED ORDER — LOPERAMIDE HCL 2 MG PO CAPS
4.0000 mg | ORAL_CAPSULE | Freq: Once | ORAL | Status: AC
  Administered 2017-01-23: 4 mg via ORAL
  Filled 2017-01-23: qty 2

## 2017-01-23 MED ORDER — LISINOPRIL-HYDROCHLOROTHIAZIDE 20-25 MG PO TABS: 1.0000 | ORAL_TABLET | Freq: Every day | ORAL | Status: DC

## 2017-01-23 MED ORDER — METHOCARBAMOL 1000 MG/10ML IJ SOLN
500.0000 mg | Freq: Four times a day (QID) | INTRAVENOUS | Status: DC | PRN
  Filled 2017-01-23: qty 5

## 2017-01-23 MED ORDER — DEXAMETHASONE SODIUM PHOSPHATE 4 MG/ML IJ SOLN: 4.0000 mg | Freq: Four times a day (QID) | INTRAMUSCULAR | Status: DC

## 2017-01-23 MED ORDER — MEPERIDINE HCL 25 MG/ML IJ SOLN: 6.2500 mg | INTRAMUSCULAR | Status: DC | PRN

## 2017-01-23 MED ORDER — INSULIN ASPART 100 UNIT/ML ~~LOC~~ SOLN: 4.0000 [IU] | Freq: Three times a day (TID) | SUBCUTANEOUS | Status: DC

## 2017-01-23 MED ORDER — THROMBIN 20000 UNITS EX SOLR
CUTANEOUS | Status: AC
  Filled 2017-01-23: qty 20000

## 2017-01-23 MED ORDER — INSULIN ASPART 100 UNIT/ML ~~LOC~~ SOLN
0.0000 [IU] | Freq: Three times a day (TID) | SUBCUTANEOUS | Status: DC
  Administered 2017-01-24 (×2): 3 [IU] via SUBCUTANEOUS
  Administered 2017-01-24: 5 [IU] via SUBCUTANEOUS
  Administered 2017-01-25 – 2017-01-26 (×2): 2 [IU] via SUBCUTANEOUS
  Administered 2017-01-26: 3 [IU] via SUBCUTANEOUS

## 2017-01-23 MED ORDER — ROCURONIUM BROMIDE 100 MG/10ML IV SOLN
INTRAVENOUS | Status: DC | PRN
  Administered 2017-01-23: 50 mg via INTRAVENOUS

## 2017-01-23 MED ORDER — THROMBIN 20000 UNITS EX SOLR
CUTANEOUS | Status: DC | PRN
  Administered 2017-01-23: 20000 [IU] via TOPICAL

## 2017-01-23 MED ORDER — ONDANSETRON HCL 4 MG/2ML IJ SOLN: 4.0000 mg | Freq: Four times a day (QID) | INTRAMUSCULAR | Status: DC | PRN

## 2017-01-23 MED ORDER — LISINOPRIL 20 MG PO TABS
20.0000 mg | ORAL_TABLET | Freq: Every day | ORAL | Status: DC
  Administered 2017-01-23 – 2017-01-26 (×4): 20 mg via ORAL
  Filled 2017-01-23 (×2): qty 1
  Filled 2017-01-23: qty 2
  Filled 2017-01-23: qty 1

## 2017-01-23 MED ORDER — INSULIN DETEMIR 100 UNIT/ML ~~LOC~~ SOLN
30.0000 [IU] | Freq: Every day | SUBCUTANEOUS | Status: DC
  Administered 2017-01-23 – 2017-01-25 (×3): 30 [IU] via SUBCUTANEOUS
  Filled 2017-01-23 (×4): qty 0.3

## 2017-01-23 MED ORDER — GADOBENATE DIMEGLUMINE 529 MG/ML IV SOLN
15.0000 mL | Freq: Once | INTRAVENOUS | Status: AC
  Administered 2017-01-23: 15 mL via INTRAVENOUS

## 2017-01-23 MED ORDER — VANCOMYCIN HCL 1000 MG IV SOLR
INTRAVENOUS | Status: DC | PRN
  Administered 2017-01-23: 1000 mg via INTRAVENOUS

## 2017-01-23 MED ORDER — DEXAMETHASONE 4 MG PO TABS
4.0000 mg | ORAL_TABLET | Freq: Four times a day (QID) | ORAL | Status: DC
  Administered 2017-01-23 – 2017-01-24 (×4): 4 mg via ORAL
  Filled 2017-01-23 (×4): qty 1

## 2017-01-23 MED ORDER — LACTATED RINGERS IV SOLN
INTRAVENOUS | Status: DC | PRN
  Administered 2017-01-23 (×2): via INTRAVENOUS

## 2017-01-23 MED ORDER — DORZOLAMIDE HCL-TIMOLOL MAL 2-0.5 % OP SOLN
1.0000 [drp] | Freq: Two times a day (BID) | OPHTHALMIC | Status: DC
  Administered 2017-01-23 – 2017-01-26 (×6): 1 [drp] via OPHTHALMIC
  Filled 2017-01-23: qty 10

## 2017-01-23 MED ORDER — GELATIN ABSORBABLE 12-7 MM EX MISC
CUTANEOUS | Status: DC | PRN
  Administered 2017-01-23: 1

## 2017-01-23 MED ORDER — SODIUM CHLORIDE 0.9% FLUSH: 3.0000 mL | INTRAVENOUS | Status: DC | PRN

## 2017-01-23 MED ORDER — VANCOMYCIN HCL IN DEXTROSE 1-5 GM/200ML-% IV SOLN
INTRAVENOUS | Status: AC
  Filled 2017-01-23: qty 200

## 2017-01-23 MED ORDER — BUPIVACAINE-EPINEPHRINE 0.25% -1:200000 IJ SOLN
INTRAMUSCULAR | Status: DC | PRN
  Administered 2017-01-23: 10 mL

## 2017-01-23 MED ORDER — MENTHOL 3 MG MT LOZG: 1.0000 | LOZENGE | OROMUCOSAL | Status: DC | PRN

## 2017-01-23 MED ORDER — METHOCARBAMOL 500 MG PO TABS
500.0000 mg | ORAL_TABLET | Freq: Four times a day (QID) | ORAL | Status: DC | PRN
  Administered 2017-01-23 – 2017-01-26 (×4): 500 mg via ORAL
  Filled 2017-01-23 (×5): qty 1

## 2017-01-23 MED ORDER — LACTATED RINGERS IV SOLN: INTRAVENOUS | Status: DC

## 2017-01-23 MED ORDER — INSULIN ASPART 100 UNIT/ML ~~LOC~~ SOLN
0.0000 [IU] | Freq: Every day | SUBCUTANEOUS | Status: DC
  Administered 2017-01-23 – 2017-01-24 (×2): 2 [IU] via SUBCUTANEOUS

## 2017-01-23 MED ORDER — HYDROCHLOROTHIAZIDE 25 MG PO TABS
25.0000 mg | ORAL_TABLET | Freq: Every day | ORAL | Status: DC
  Administered 2017-01-23 – 2017-01-26 (×4): 25 mg via ORAL
  Filled 2017-01-23 (×4): qty 1

## 2017-01-23 MED ORDER — ONDANSETRON HCL 4 MG/2ML IJ SOLN
INTRAMUSCULAR | Status: DC | PRN
  Administered 2017-01-23: 4 mg via INTRAVENOUS

## 2017-01-23 MED ORDER — SODIUM CHLORIDE 0.9 % IV SOLN: 250.0000 mL | INTRAVENOUS | Status: DC

## 2017-01-23 MED ORDER — PHENOL 1.4 % MT LIQD: 1.0000 | OROMUCOSAL | Status: DC | PRN

## 2017-01-23 MED ORDER — PROPOFOL 10 MG/ML IV BOLUS
INTRAVENOUS | Status: AC
  Filled 2017-01-23: qty 20

## 2017-01-23 MED ORDER — FENTANYL CITRATE (PF) 250 MCG/5ML IJ SOLN
INTRAMUSCULAR | Status: AC
  Filled 2017-01-23: qty 5

## 2017-01-23 MED ORDER — AMLODIPINE BESYLATE 10 MG PO TABS
10.0000 mg | ORAL_TABLET | Freq: Every day | ORAL | Status: DC
  Administered 2017-01-23 – 2017-01-26 (×4): 10 mg via ORAL
  Filled 2017-01-23 (×4): qty 1

## 2017-01-23 MED ORDER — DEXTROSE 5 % IV SOLN
INTRAVENOUS | Status: DC | PRN
  Administered 2017-01-23: 50 ug/min via INTRAVENOUS

## 2017-01-23 MED ORDER — MORPHINE SULFATE (PF) 2 MG/ML IV SOLN
2.0000 mg | INTRAVENOUS | Status: DC | PRN
  Administered 2017-01-24: 2 mg via INTRAVENOUS
  Filled 2017-01-23: qty 1

## 2017-01-23 MED ORDER — MICROFIBRILLAR COLL HEMOSTAT EX POWD
CUTANEOUS | Status: AC
  Filled 2017-01-23: qty 10

## 2017-01-23 MED ORDER — MICROFIBRILLAR COLL HEMOSTAT EX POWD
CUTANEOUS | Status: DC | PRN
  Administered 2017-01-23: 5 g via TOPICAL

## 2017-01-23 MED ORDER — ACETAMINOPHEN 650 MG RE SUPP: 650.0000 mg | RECTAL | Status: DC | PRN

## 2017-01-23 MED ORDER — PRAVASTATIN SODIUM 10 MG PO TABS
10.0000 mg | ORAL_TABLET | Freq: Every evening | ORAL | Status: DC
  Administered 2017-01-23 – 2017-01-25 (×3): 10 mg via ORAL
  Filled 2017-01-23 (×3): qty 1

## 2017-01-23 SURGICAL SUPPLY — 58 items
BNDG GAUZE ELAST 4 BULKY (GAUZE/BANDAGES/DRESSINGS) ×3 IMPLANT
CANISTER SUCT 3000ML PPV (MISCELLANEOUS) ×3 IMPLANT
CLOSURE STERI-STRIP 1/2X4 (GAUZE/BANDAGES/DRESSINGS) ×1
CLSR STERI-STRIP ANTIMIC 1/2X4 (GAUZE/BANDAGES/DRESSINGS) ×2 IMPLANT
COVER SURGICAL LIGHT HANDLE (MISCELLANEOUS) ×3 IMPLANT
DRAIN CHANNEL 15F RND FF W/TCR (WOUND CARE) ×2 IMPLANT
DRAPE SURG 17X23 STRL (DRAPES) ×9 IMPLANT
DRAPE U-SHAPE 47X51 STRL (DRAPES) ×3 IMPLANT
DRSG AQUACEL AG ADV 3.5X 6 (GAUZE/BANDAGES/DRESSINGS) ×3 IMPLANT
DRSG OPSITE POSTOP 4X6 (GAUZE/BANDAGES/DRESSINGS) ×2 IMPLANT
DURAPREP 26ML APPLICATOR (WOUND CARE) ×3 IMPLANT
ELECT BLADE 4.0 EZ CLEAN MEGAD (MISCELLANEOUS) ×3
ELECT PENCIL ROCKER SW 15FT (MISCELLANEOUS) ×3 IMPLANT
ELECT REM PT RETURN 9FT ADLT (ELECTROSURGICAL) ×3
ELECTRODE BLDE 4.0 EZ CLN MEGD (MISCELLANEOUS) ×1 IMPLANT
ELECTRODE REM PT RTRN 9FT ADLT (ELECTROSURGICAL) ×1 IMPLANT
GAUZE SPONGE 4X4 16PLY XRAY LF (GAUZE/BANDAGES/DRESSINGS) ×2 IMPLANT
GLOVE BIO SURGEON STRL SZ 6.5 (GLOVE) ×2 IMPLANT
GLOVE BIO SURGEONS STRL SZ 6.5 (GLOVE) ×1
GLOVE BIOGEL PI IND STRL 6.5 (GLOVE) ×1 IMPLANT
GLOVE BIOGEL PI IND STRL 8.5 (GLOVE) ×1 IMPLANT
GLOVE BIOGEL PI INDICATOR 6.5 (GLOVE) ×2
GLOVE BIOGEL PI INDICATOR 8.5 (GLOVE) ×2
GLOVE SS BIOGEL STRL SZ 8.5 (GLOVE) ×1 IMPLANT
GLOVE SUPERSENSE BIOGEL SZ 8.5 (GLOVE) ×2
GOWN STRL REUS W/ TWL XL LVL3 (GOWN DISPOSABLE) ×2 IMPLANT
GOWN STRL REUS W/TWL 2XL LVL3 (GOWN DISPOSABLE) ×3 IMPLANT
GOWN STRL REUS W/TWL XL LVL3 (GOWN DISPOSABLE) ×6
KIT BASIN OR (CUSTOM PROCEDURE TRAY) ×3 IMPLANT
KIT ROOM TURNOVER OR (KITS) ×3 IMPLANT
NDL SPNL 18GX3.5 QUINCKE PK (NEEDLE) ×2 IMPLANT
NEEDLE 22X1 1/2 (OR ONLY) (NEEDLE) ×3 IMPLANT
NEEDLE SPNL 18GX3.5 QUINCKE PK (NEEDLE) ×6 IMPLANT
NS IRRIG 1000ML POUR BTL (IV SOLUTION) ×3 IMPLANT
PACK LAMINECTOMY ORTHO (CUSTOM PROCEDURE TRAY) ×3 IMPLANT
PACK UNIVERSAL I (CUSTOM PROCEDURE TRAY) ×3 IMPLANT
PAD ARMBOARD 7.5X6 YLW CONV (MISCELLANEOUS) ×6 IMPLANT
PATTIES SURGICAL .5 X.5 (GAUZE/BANDAGES/DRESSINGS) IMPLANT
PATTIES SURGICAL .5 X1 (DISPOSABLE) ×3 IMPLANT
SPONGE SURGIFOAM ABS GEL 100 (HEMOSTASIS) ×3 IMPLANT
SURGIFLO W/THROMBIN 8M KIT (HEMOSTASIS) ×3 IMPLANT
SUT BONE WAX W31G (SUTURE) ×3 IMPLANT
SUT MON AB 3-0 SH 27 (SUTURE) ×3
SUT MON AB 3-0 SH27 (SUTURE) ×1 IMPLANT
SUT PROLENE 2 0 SH 30 (SUTURE) ×2 IMPLANT
SUT SILK 2 0SH CR/8 30 (SUTURE) ×2 IMPLANT
SUT VIC AB 1 CT1 27 (SUTURE) ×9
SUT VIC AB 1 CT1 27XBRD ANBCTR (SUTURE) ×1 IMPLANT
SUT VIC AB 2-0 CT1 18 (SUTURE) ×5 IMPLANT
SUT VIC AB 2-0 CT1 27 (SUTURE) ×3
SUT VIC AB 2-0 CT1 TAPERPNT 27 (SUTURE) IMPLANT
SYR BULB IRRIGATION 50ML (SYRINGE) ×2 IMPLANT
SYR CONTROL 10ML LL (SYRINGE) ×3 IMPLANT
TAPE STRIPS DRAPE STRL (GAUZE/BANDAGES/DRESSINGS) ×2 IMPLANT
TOWEL OR 17X24 6PK STRL BLUE (TOWEL DISPOSABLE) ×3 IMPLANT
TOWEL OR 17X26 10 PK STRL BLUE (TOWEL DISPOSABLE) ×3 IMPLANT
WATER STERILE IRR 1000ML POUR (IV SOLUTION) ×3 IMPLANT
YANKAUER SUCT BULB TIP NO VENT (SUCTIONS) ×3 IMPLANT

## 2017-01-23 NOTE — Progress Notes (Signed)
Pharmacy Antibiotic Note  Donald Calderon is a 76 y.o. male admitted on 01/21/2017 with surgical prophylaxis.  Pharmacy has been consulted for Vancomycin dosing while drain in place  Plan: Vancomycin 1 gram iv Q 12  Height: 5\' 9"  (175.3 cm) Weight: 208 lb (94.3 kg) IBW/kg (Calculated) : 70.7  Temp (24hrs), Avg:98.9 F (37.2 C), Min:97.3 F (36.3 C), Max:100.1 F (37.8 C)   Recent Labs Lab 01/23/17 1315  WBC 11.1*  CREATININE 1.10    Estimated Creatinine Clearance: 65.7 mL/min (by C-G formula based on SCr of 1.1 mg/dL).    Allergies  Allergen Reactions  . Penicillins Itching and Rash    Has patient had a PCN reaction causing immediate rash, facial/tongue/throat swelling, SOB or lightheadedness with hypotension: Yes Has patient had a PCN reaction causing severe rash involving mucus membranes or skin necrosis: No Has patient had a PCN reaction that required hospitalization: No Has patient had a PCN reaction occurring within the last 10 years: No If all of the above answers are "NO", then may proceed with Cephalosporin use.      Thank you for allowing pharmacy to be a part of this patient's care. Anette Guarneri, PharmD [phone] 01/23/2017 4:24 PM

## 2017-01-23 NOTE — Progress Notes (Signed)
Patient arrived to unit. Denies pain at this time. Incentive spirometer given and educated.

## 2017-01-23 NOTE — Anesthesia Procedure Notes (Signed)
Procedure Name: Intubation Date/Time: 01/23/2017 1:19 PM Performed by: Clearnce Sorrel Pre-anesthesia Checklist: Patient identified, Emergency Drugs available, Suction available, Patient being monitored and Timeout performed Patient Re-evaluated:Patient Re-evaluated prior to induction Oxygen Delivery Method: Circle system utilized Preoxygenation: Pre-oxygenation with 100% oxygen Induction Type: IV induction Ventilation: Mask ventilation without difficulty and Oral airway inserted - appropriate to patient size Laryngoscope Size: Mac and 3 Grade View: Grade I Tube type: Oral Tube size: 7.5 mm Number of attempts: 1 Airway Equipment and Method: Stylet Placement Confirmation: ETT inserted through vocal cords under direct vision,  positive ETCO2 and breath sounds checked- equal and bilateral Secured at: 23 cm Tube secured with: Tape Dental Injury: Teeth and Oropharynx as per pre-operative assessment

## 2017-01-23 NOTE — Anesthesia Preprocedure Evaluation (Signed)
Anesthesia Evaluation  Patient identified by MRN, date of birth, ID band Patient awake    Reviewed: Allergy & Precautions, NPO status , Patient's Chart, lab work & pertinent test results  Airway Mallampati: I  TM Distance: >3 FB Neck ROM: Full    Dental   Pulmonary former smoker,    Pulmonary exam normal        Cardiovascular hypertension, Pt. on medications Normal cardiovascular exam     Neuro/Psych Anxiety Depression    GI/Hepatic GERD  Controlled and Medicated,  Endo/Other  diabetes, Type 2, Insulin Dependent  Renal/GU      Musculoskeletal   Abdominal   Peds  Hematology   Anesthesia Other Findings   Reproductive/Obstetrics                             Anesthesia Physical Anesthesia Plan  ASA: III and emergent  Anesthesia Plan: General   Post-op Pain Management:    Induction: Intravenous  PONV Risk Score and Plan: 2 and Ondansetron and Dexamethasone  Airway Management Planned: Oral ETT  Additional Equipment:   Intra-op Plan:   Post-operative Plan: Extubation in OR  Informed Consent: I have reviewed the patients History and Physical, chart, labs and discussed the procedure including the risks, benefits and alternatives for the proposed anesthesia with the patient or authorized representative who has indicated his/her understanding and acceptance.     Plan Discussed with: CRNA and Surgeon  Anesthesia Plan Comments:         Anesthesia Quick Evaluation

## 2017-01-23 NOTE — Progress Notes (Signed)
Patient left unit to OR at this time. Alert and in stable condition. Report given to receiving nurse at [place] with all questions answered. Left unit via bed with wife, daughter  and all belongings at side.

## 2017-01-23 NOTE — Progress Notes (Addendum)
    Subjective: Procedure(s) (LRB): Decompression Lumbar 2-3 (N/A) 2 Days Post-Op  Patient reports pain as 6 on 0-10 scale.  Reports increased leg pain reports incisional back pain   Positive void Positive bowel movement Positive flatus Negative chest pain or shortness of breath  Objective: Vital signs in last 24 hours: Temp:  [98.8 F (37.1 C)-100.1 F (37.8 C)] 99 F (37.2 C) (08/11 0825) Pulse Rate:  [74-92] 82 (08/11 0825) Resp:  [20] 20 (08/11 0825) BP: (139-169)/(74-83) 141/83 (08/11 0825) SpO2:  [96 %-99 %] 96 % (08/11 0825) Weight:  [94.3 kg (208 lb)] 94.3 kg (208 lb) (08/10 1100)  Intake/Output from previous day: No intake/output data recorded.  Labs: No results for input(s): WBC, RBC, HCT, PLT in the last 72 hours. No results for input(s): NA, K, CL, CO2, BUN, CREATININE, GLUCOSE, CALCIUM in the last 72 hours. No results for input(s): LABPT, INR in the last 72 hours.  Physical Exam: Neurologically intact Neurovascular intact Intact pulses distally Incision: dressing C/D/I Compartment soft Rectal: intact sensation to pin prick.  Tone: poor Positive diarrhea - pain limits ability to get to bathroom.  Reports sensation of need to go before defecation No issues with urination  Assessment/Plan: Patient stable  xrays n/a Continue mobilization with physical therapy Continue care  I do not think patient has a cauda equina syndrome.  Clinical exam not completely consistent.  However he has subjective complaints that are worse today.  Yesterday able to ambulate 62ft -today refusing to do anything. Will obtain stat MRI to make sure no new compression NPO until after MRI reviewed   Melina Schools, MD [place] [phone]

## 2017-01-23 NOTE — Anesthesia Postprocedure Evaluation (Signed)
Anesthesia Post Note  Patient: Donald Calderon  Procedure(s) Performed: Procedure(s) (LRB): EXPLORATION AND EVACUATION OF HEMATOMA L2-L3 (N/A)     Patient location during evaluation: PACU Anesthesia Type: General Level of consciousness: awake and alert Pain management: pain level controlled Vital Signs Assessment: post-procedure vital signs reviewed and stable Respiratory status: spontaneous breathing, nonlabored ventilation, respiratory function stable and patient connected to nasal cannula oxygen Cardiovascular status: blood pressure returned to baseline and stable Postop Assessment: no signs of nausea or vomiting Anesthetic complications: no    Last Vitals:  Vitals:   01/23/17 0825 01/23/17 1232  BP: (!) 141/83 (!) 141/79  Pulse: 82 82  Resp: 20 20  Temp: 37.2 C 37.2 C  SpO2: 96% 96%    Last Pain:  Vitals:   01/23/17 0955  TempSrc:   PainSc: 6                  [name] DAVID

## 2017-01-23 NOTE — Progress Notes (Signed)
MRI. I reviewed the MRI images has spoken with the radiologist. I do agree that there is significant extrinsic compression at the operative L2-3 level. Most likely secondary to an epidural hematoma. Does not appear to be consistent with CSF fluid.  At this point time given the increasing leg pain as well as the questionable incontinence of bowel I've elected to take him back to the operating room for evacuation of the hematoma.  I've discussed the risks with the patient his wife and his daughter. Infection bleeding nerve damage death stroke paralysis failure to heal move for additional surgery ongoing or worse pain.  The plan will be do an evacuation of the hematoma exploration for ongoing bleeding and then insertion of a postoperative drain. Patient will most likely spend at least the remainder of the weekend in the hospital recovering from this.

## 2017-01-23 NOTE — Progress Notes (Signed)
PT Cancellation Note  Patient Details Name: Donald Calderon MRN: [id number] DOB: Apr 17, 1941   Cancelled Treatment:    Reason Eval/Treat Not Completed: Medical issues which prohibited therapy. Per MD, pt has had a decline since yesterday. MD ordering a STAT MRI and PT is on hold for now. Will check back once medically stable to participate.    Scheryl Marten PT, DPT  [phone]  01/23/2017, 9:24 AM

## 2017-01-23 NOTE — Progress Notes (Signed)
Occupational Therapy Treatment Patient Details Name: Donald Calderon MRN: [id number] DOB: December 11, 1940 Today's Date: 01/23/2017    History of present illness Pt is a 76 y/o male s/p L2-3 lumbar laminectomy/decompression. PMH includes HTN, DM, anxiety, and depression.    OT comments  Pt. Presenting with increased c/o muscle spasms and "numbness" in bles.  Min a to initiate rolling for bed mobility.  Mod a for sit/stand with pt. Unable to initiate pivot steps to recliner.  PT, RN, MD notified of pts. Concerns and notable change from his mobility yesterday.  Will follow along acutely.    Follow Up Recommendations  Home health OT;Supervision/Assistance - 24 hour    Equipment Recommendations  None recommended by OT    Recommendations for Other Services      Precautions / Restrictions Precautions Precautions: Back Required Braces or Orthoses: Spinal Brace Spinal Brace: Lumbar corset       Mobility Bed Mobility Overal bed mobility: Needs Assistance Bed Mobility: Rolling;Sidelying to Sit Rolling: Min assist Sidelying to sit: Min guard       General bed mobility comments: Cues for technique. no rails,  HOB flat-exists on the R side at home  Transfers Overall transfer level: Needs assistance Equipment used: Rolling walker (2 wheeled) Transfers: Sit to/from Stand Sit to Stand: Mod assist         General transfer comment: sit/stand x3, max cues to relax shoulders and maintain upright posture.  pt. unable to initiate steps had to sit down each time stating "my legs feel numb i have not control of them"    Balance                                           ADL either performed or assessed with clinical judgement   ADL Overall ADL's : Needs assistance/impaired                 Upper Body Dressing : Minimal assistance;Sitting Upper Body Dressing Details (indicate cue type and reason): to don brace                   General ADL Comments: attempted  functional mobility from eob to recliner.  pt. unable to initiate steps this am, sit to stand x3 then had to be assisted back to bed     Vision       Perception     Praxis      Cognition Arousal/Alertness: Awake/alert Behavior During Therapy: WFL for tasks assessed/performed Overall Cognitive Status: Within Functional Limits for tasks assessed                                          Exercises     Shoulder Instructions       General Comments      Pertinent Vitals/ Pain       Pain Location: back spasms  Home Living                                          Prior Functioning/Environment              Frequency  Min 2X/week        Progress  Toward Goals  OT Goals(current goals can now be found in the care plan section)  Progress towards OT goals: Not progressing toward goals - comment (pt. stating he is unable to feel his legs today)     Plan Discharge plan remains appropriate    Co-evaluation                 AM-PAC PT "6 Clicks" Daily Activity     Outcome Measure   Help from another person eating meals?: A Little Help from another person taking care of personal grooming?: A Little Help from another person toileting, which includes using toliet, bedpan, or urinal?: A Little Help from another person bathing (including washing, rinsing, drying)?: A Lot Help from another person to put on and taking off regular upper body clothing?: A Little Help from another person to put on and taking off regular lower body clothing?: A Lot 6 Click Score: 16    End of Session Equipment Utilized During Treatment: Gait belt;Rolling walker;Back brace  OT Visit Diagnosis: Unsteadiness on feet (R26.81);Other abnormalities of gait and mobility (R26.89);Muscle weakness (generalized) (M62.81);Pain   Activity Tolerance Patient limited by pain;Other (comment) (limited by c/o B LE numbness)   Patient Left in bed;with call bell/phone  within reach   Nurse Communication Mobility status (informed RN of pts. inability to initiate steps and transfer, also c/o not being able to feel his legs or buttocks)        Time: 1117-3567 OT Time Calculation (min): 29 min  Charges: OT General Charges $OT Visit: 1 Procedure OT Treatments $Self Care/Home Management : 23-37 mins  Janice Coffin, COTA/L 01/23/2017, 8:13 AM

## 2017-01-23 NOTE — Transfer of Care (Signed)
Immediate Anesthesia Transfer of Care Note  Patient: Donald Calderon  Procedure(s) Performed: Procedure(s): EXPLORATION AND EVACUATION OF HEMATOMA L2-L3 (N/A)  Patient Location: PACU  Anesthesia Type:General  Level of Consciousness: awake, alert  and oriented  Airway & Oxygen Therapy: Patient Spontanous Breathing and Patient connected to nasal cannula oxygen  Post-op Assessment: Report given to RN and Post -op Vital signs reviewed and stable  Post vital signs: Reviewed and stable  Last Vitals:  Vitals:   01/23/17 0825 01/23/17 1232  BP: (!) 141/83 (!) 141/79  Pulse: 82 82  Resp: 20 20  Temp: 37.2 C 37.2 C  SpO2: 96% 96%    Last Pain:  Vitals:   01/23/17 0955  TempSrc:   PainSc: 6       Patients Stated Pain Goal: 3 (40/35/24 8185)  Complications: No apparent anesthesia complications

## 2017-01-23 NOTE — Op Note (Signed)
Operative note   Preoperative diagnosis. Epidural hematoma with severe spinal stenosis.  Postoperative diagnosis. Same.  Operative procedure. Evacuation of hematoma with placement of postoperative drain.  Intraoperative findings. Significant coagulated hematoma formation causing significant thecal sac compression. Consistent with preoperative MRI done earlier today. Evacuated the entire hematoma restored the normal caliber to the spinal canal. Post decompression Valsalva maneuver to 40 mmHg for 10 seconds demonstrated no CSF leak, or epidural bleeding.  Anesthesia. Gen.  Indications. This is a pleasant 76 year old gentleman who earlier in the week underwent an L2-3 lumbar decompression for severe spinal stenosis. The patient initially did well. However today postoperative day 2 the patient was noted to have increased lower extremity pain especially on the left side along with diarrhea. Preoperatively imaging studies demonstrated a significant hematoma at the operative site causing recurrent marked spinal stenosis. As a result I elected to take the patient back to the operating room for evacuation of the hematoma. All appropriate risks benefits and alternatives were discussed with the patient and his family and consent was obtained.  Operative note patient was brought the operating room placed on the operating table. After successful induction of general anesthesia and endotracheal intubation teds SCDs and a Foley were inserted and he was turned prone onto the Wilson frame. All bony prominences were well-padded and the back was prepped and draped in a standard fashion. Timeout was taken to confirm patient procedure and all other important data. Once this was completed the Monocryl suture was cut and removed and I sequentially removed the 2-0 Vicryl sutures. I then opened up the superficial tissue and expose the deep fascia. I remove the #1 Vicryl sutures and exposed the into the deep fascia. Here I was  able to evaluate the area. I placed a self-retaining retractor irrigated and removed a copious amount of coagulated blood. There was no active bleeding that I could see. Using a Penfield 4 I continued to dissect through the epidural hematoma until I could visualize the dorsal surface of the thecal sac. Once I was able to use this I continued to remove the epidural hematoma. Once I evacuated the entire hematoma I then went into the lateral recess and palpated. The thecal sac itself was no longer under any undue compression as I removed the entire epidural hematoma. I copiously irrigated with about 2 L of normal saline. I then identified the epidural veins in the lateral recess and coagulated them again with bipolar cautery. There was no active bleeding in the lateral recess. Although I do not see active bleeding from the bone edges I packed the edges again with bone wax. I irrigated the wound again copiously. I then placed FloSeal in the lateral recess and along the dorsal surface of the thecal sac and Avitene along the muscle bed. After approximately 5 minutes I irrigated all this material out. Again I checked the thecal sac the lateral gutter and the foramen. I could easily pass my Woodson elevator superiorly inferiorly and centrally. At this point I then did a Valsalva to 40 and held it for 10 seconds. There was no active bleeding nor was any CSF leak.  A deep drain was placed and brought out through a separate stab incision. I then closed the deep fascia with interrupted #1 Vicryl sutures superficial with 2-0 Vicryl sutures and a 2-0 Prolene for the skin. Steri-Strips and a dry dressing were applied. Patient was extubated transferred the PACU that incident. The end of the case needle sponge counts were correct.

## 2017-01-23 NOTE — Brief Op Note (Signed)
01/21/2017 - 01/23/2017  3:01 PM  PATIENT:  Vergia Alberts  76 y.o. male  PRE-OPERATIVE DIAGNOSIS:  post op and leg pain  POST-OPERATIVE DIAGNOSIS:  post op and leg pain  PROCEDURE:  Procedure(s): EXPLORATION AND EVACUATION OF HEMATOMA L2-L3 (N/A)  SURGEON:  Surgeon(s) and Role:    Melina Schools, MD - Primary  PHYSICIAN ASSISTANT:   ASSISTANTS: none   ANESTHESIA:   general  EBL:  Total I/O In: 1000 [I.V.:1000] Out: 1050 [Urine:1000; Blood:50]  BLOOD ADMINISTERED:none  DRAINS: 1 JP in the back   LOCAL MEDICATIONS USED:  NONE  SPECIMEN:  No Specimen  DISPOSITION OF SPECIMEN:  N/A  COUNTS:  YES  TOURNIQUET:  * No tourniquets in log *  DICTATION: .Dragon Dictation  PLAN OF CARE: Admit to inpatient   PATIENT DISPOSITION:  PACU - hemodynamically stable.

## 2017-01-24 ENCOUNTER — Encounter (HOSPITAL_COMMUNITY): Payer: Self-pay | Admitting: Orthopedic Surgery

## 2017-01-24 LAB — GLUCOSE, CAPILLARY
GLUCOSE-CAPILLARY: 195 mg/dL — AB (ref 65–99)
GLUCOSE-CAPILLARY: 222 mg/dL — AB (ref 65–99)
GLUCOSE-CAPILLARY: 173 mg/dL — AB (ref 65–99)
Glucose-Capillary: 215 mg/dL — ABNORMAL HIGH (ref 65–99)

## 2017-01-24 MED ORDER — HYDROCODONE-ACETAMINOPHEN 10-325 MG PO TABS
1.0000 | ORAL_TABLET | Freq: Four times a day (QID) | ORAL | Status: DC | PRN
  Administered 2017-01-24 – 2017-01-26 (×5): 1 via ORAL
  Filled 2017-01-24 (×5): qty 1

## 2017-01-24 NOTE — Progress Notes (Signed)
Physical Therapy Re-Evaluation  Patient Details Name: Donald Calderon MRN: [id number] DOB: 1941-05-03 Today's Date: 01/24/2017    History of Present Illness Pt is a 76 y/o male s/p L2-3 lumbar laminectomy/decompression. 8/11 underwent Evacuation of hematoma with placement of postoperative drain due to change in status.PMH includes HTN, DM, anxiety, and depression.     PT Comments    PT re-eval completed following return to OR for spinal hematoma evacuation. Pt now requires min to mod assist bed mobility, +2 mod assist sit to stand, and +2 max assist stand pivot transfer. Recommend use of stedy for nursing to assist with transfers. See below for further mobility details. Goals and discharge recommendations updated.    Follow Up Recommendations  CIR     Equipment Recommendations  None recommended by PT    Recommendations for Other Services Rehab consult     Precautions / Restrictions Precautions Precautions: Fall;Back Precaution Booklet Issued: Yes (comment) Precaution Comments: Reviewed 3/3 back precautions. Required Braces or Orthoses: Spinal Brace Spinal Brace: Lumbar corset;Applied in sitting position Restrictions Weight Bearing Restrictions: No    Mobility  Bed Mobility Overal bed mobility: Needs Assistance Bed Mobility: Rolling;Sidelying to Sit;Sit to Sidelying Rolling: Min assist Sidelying to sit: Min assist     Sit to sidelying: Mod assist (A to lift BLE onto bed) General bed mobility comments: Pt in recliner. During OT session today, pt required min to mod assist with bed mobility.  Transfers Overall transfer level: Needs assistance Equipment used: Rolling walker (2 wheeled) Transfers: Sit to/from [facility] Sit to Stand: +2 physical assistance;Mod assist Stand pivot transfers: +2 physical assistance;Max assist       General transfer comment: Sit to stand x 2 reps. Multimodal cues for upright posture. Heavy reliance on BUE through  RW.  Ambulation/Gait             General Gait Details: unable   Stairs            Wheelchair Mobility    Modified Rankin (Stroke Patients Only)       Balance Overall balance assessment: Needs assistance Sitting-balance support: Feet supported;No upper extremity supported Sitting balance-Leahy Scale: Fair     Standing balance support: Bilateral upper extremity supported;During functional activity Standing balance-Leahy Scale: Zero Standing balance comment: RW for support; difficulty maintaing full upright posture                            Cognition Arousal/Alertness: Awake/alert Behavior During Therapy: WFL for tasks assessed/performed Overall Cognitive Status: Within Functional Limits for tasks assessed                                        Exercises Other Exercises Other Exercises: mini squats x 3 with +2 min assist. bilat knees blocked for safety but no buckling noted. Other Exercises: attempted heel raises. Pt unable to clear either heel from floor.    General Comments        Pertinent Vitals/Pain Pain Assessment: 0-10 Pain Score: 4  Pain Location: back Pain Descriptors / Indicators: Sore;Grimacing;Guarding Pain Intervention(s): Monitored during session;Limited activity within patient's tolerance    Home Living Family/patient expects to be discharged to:: Private residence Living Arrangements: Spouse/significant other Available Help at Discharge: Family;Available 24 hours/day Type of Home: House Home Access: Stairs to enter Entrance Stairs-Rails: Right;Left Home Layout: One level Home Equipment:  Walker - 2 wheels;Cane - single point;Shower seat;Grab bars - tub/shower;Grab bars - toilet      Prior Function Level of Independence: Independent with assistive device(s)      Comments: Used cane at baseline    PT Goals (current goals can now be found in the care plan section) Acute Rehab PT Goals Patient Stated  Goal: to return to normal PT Goal Formulation: With patient Time For Goal Achievement: 01/31/17 Potential to Achieve Goals: Good Progress towards PT goals: Goals downgraded-see care plan    Frequency    Min 5X/week      PT Plan Discharge plan needs to be updated    Co-evaluation              AM-PAC PT "6 Clicks" Daily Activity  Outcome Measure  Difficulty turning over in bed (including adjusting bedclothes, sheets and blankets)?: Total Difficulty moving from lying on back to sitting on the side of the bed? : Total Difficulty sitting down on and standing up from a chair with arms (e.g., wheelchair, bedside commode, etc,.)?: Total Help needed moving to and from a bed to chair (including a wheelchair)?: A Lot Help needed walking in hospital room?: Total Help needed climbing 3-5 steps with a railing? : Total 6 Click Score: 7    End of Session Equipment Utilized During Treatment: Gait belt;Back brace Activity Tolerance: Patient tolerated treatment well Patient left: in chair;with chair alarm set;with call bell/phone within reach Nurse Communication: Mobility status PT Visit Diagnosis: Other abnormalities of gait and mobility (R26.89);Pain;Muscle weakness (generalized) (M62.81)     Time: 8338-2505 PT Time Calculation (min) (ACUTE ONLY): 26 min  Charges:  $Therapeutic Activity: 8-22 mins                    G Codes:       Lorrin Goodell, PT  Office # [phone] Pager [phone]    Lorriane Shire 01/24/2017, 3:44 PM

## 2017-01-24 NOTE — Progress Notes (Signed)
Occupational therapist reported to RN that patient's right leg very ataxic, patient states he feels weaker in bilateral legs, reports numbness in groin, had an episode of incontinence of bowel and bladder when standing with OT. OT reported patient unsafe for transfer at this time. VSS, neuro assessment unchanged. Patient denies any pain at this time.  RN notified Dr. Rolena Infante. Dr. Rolena Infante to come to bedside.

## 2017-01-24 NOTE — Progress Notes (Signed)
OT Cancellation Note  Patient Details Name: Donald Calderon MRN: [id number] DOB: [birth year]   Cancelled Treatment:    Reason Eval/Treat Not Completed: Other (comment). Wife accidentally took his back brace home. Will return later once pt has his brace.   [place], OT/L  [phone] 01/24/2017 01/24/2017, 7:59 AM

## 2017-01-24 NOTE — Progress Notes (Signed)
Inpatient Rehabilitation  Patient was screened by Gunnar Fusi for appropriateness for an Inpatient Acute Rehab consult.  Note that a consult order has been placed.  Plan for an Admission Coordinator to up after the consult has been completed.     Carmelia Roller., CCC/SLP Admission Coordinator  [place]  Cell [phone]

## 2017-01-24 NOTE — Progress Notes (Signed)
    Subjective: Procedure(s) (LRB): EXPLORATION AND EVACUATION OF HEMATOMA L2-L3 (N/A) 1 Day Post-Op  Patient reports pain as 2 on 0-10 scale.  Reports decreased leg pain reports incisional back pain   Positive void - incontinent Positive bowel movement - incontinent Positive flatus Negative chest pain or shortness of breath  Objective: Vital signs in last 24 hours: Temp:  [97.3 F (36.3 C)-98.9 F (37.2 C)] 98.6 F (37 C) (08/12 1230) Pulse Rate:  [71-91] 73 (08/12 1230) Resp:  [10-20] 15 (08/12 1230) BP: (127-159)/(59-78) 129/59 (08/12 1230) SpO2:  [95 %-100 %] 96 % (08/12 1230)  Intake/Output from previous day: 08/11 0701 - 08/12 0700 In: 1904.8 [I.V.:1704.8; IV Piggyback:200] Out: 9562 [Urine:3525; Drains:65; Blood:50]  Labs:  Recent Labs  01/23/17 1315  WBC 11.1*  RBC 3.68*  HCT 33.5*  PLT 222    Recent Labs  01/23/17 1315  NA 134*  K 3.8  CL 98*  CO2 26  BUN 11  CREATININE 1.10  GLUCOSE 129*  CALCIUM 8.8*   No results for input(s): LABPT, INR in the last 72 hours.  Physical Exam: ABD soft Intact pulses distally Incision: dressing C/D/I Compartment soft Rectal tone: remains poor.  Sensation to pin prick: intact in saddle and peri-anal region  Assessment/Plan: Patient stable  xrays n/a Continue mobilization with physical therapy Continue care  Reviewed my note from yesterday: error noted concerning rectal tone.   I corrected that - he did have poor tone but his sensation is grossly intact to pin prick.   LE weakness improved compared to yesterday before evacuation of hematoma.   Proprioception is poor in the LE which is why his gait is poor.   Drain output has been low - will keep in for now and continue abx to prevent infection. Unfortunately patient developed a post-operative hematoma and cauda equina syndrome.  I have evacuated the hematoma and addressed the acute cord compression.  I am hopeful that in time his neuro deficits will  improve. However, given his current deficits I do not think he is going to be able to go home.  I do think to maximize his recover that CIR would be very helpful. Case discussed with nsg staff and will continue to monitor.  Melina Schools, MD [place] [phone]

## 2017-01-24 NOTE — Progress Notes (Signed)
   Subjective: 1 Day Post-Op Procedure(s) (LRB): EXPLORATION AND EVACUATION OF HEMATOMA L2-L3 (N/A) Patient reports pain as mild.   Patient seen in rounds for Dr. Rolena Infante. Patient is well, and has had no acute complaints or problems other than some soreness from incisional back pain. Reports significant improvement in his left leg pain. Voiding well. Positive flatus. Denies SOB and chest pain.    Objective: Vital signs in last 24 hours: Temp:  [97.3 F (36.3 C)-99 F (37.2 C)] 98.9 F (37.2 C) (08/12 0608) Pulse Rate:  [71-91] 78 (08/12 0608) Resp:  [10-20] 20 (08/12 0608) BP: (127-159)/(66-83) 143/70 (08/12 0608) SpO2:  [95 %-100 %] 99 % (08/12 0608)  Intake/Output from previous day:  Intake/Output Summary (Last 24 hours) at 01/24/17 0812 Last data filed at 01/24/17 0700  Gross per 24 hour  Intake          1904.83 ml  Output             3640 ml  Net         -1735.17 ml     Labs:  Recent Labs  01/23/17 1315  HGB 11.0*    Recent Labs  01/23/17 1315  WBC 11.1*  RBC 3.68*  HCT 33.5*  PLT 222    Recent Labs  01/23/17 1315  NA 134*  K 3.8  CL 98*  CO2 26  BUN 11  CREATININE 1.10  GLUCOSE 129*  CALCIUM 8.8*    EXAM General - Patient is Alert and Oriented Extremity - Dorsiflexion/Plantar flexion intact but continues to be weak on left foot Patient able to perform SLR bialterally No cellulitis present Compartment soft Dressing/Incision - clean, dry, no drainage Moderate drainage from drain Motor Function - intact, moving foot and toes well on exam.   Past Medical History:  Diagnosis Date  . ALCOHOL ABUSE   . Anxiety   . Arthritis   . Depression   . Diabetes mellitus   . GERD (gastroesophageal reflux disease)   . Hypertension   . Pneumonia     Assessment/Plan: 1 Day Post-Op Procedure(s) (LRB): EXPLORATION AND EVACUATION OF HEMATOMA L2-L3 (N/A) Active Problems:   Status post lumbar surgery   Status post lumbar spine operation   Epidural  hematoma (HCC)  Estimated body mass index is 30.72 kg/m as calculated from the following:   Height as of this encounter: 5\' 9"  (1.753 m).   Weight as of this encounter: 94.3 kg (208 lb). Advance diet Up with therapy  Patient showing much improvement from yesterday morning. Will keep drain in for today. Dr. Rolena Infante to make decision about removal tomorrow. Patient to get up and ambulate with therapy today.   Ardeen Jourdain, PA-C Orthopaedic Surgery 01/24/2017, 8:12 AM

## 2017-01-24 NOTE — Progress Notes (Signed)
Called patient's wife x2 for brace.

## 2017-01-24 NOTE — Progress Notes (Signed)
Occupational Therapy Re -Evaluation Patient Details Name: Donald Calderon MRN: [id number] DOB: April 29, 1941 Today's Date: 01/24/2017    History of Present Illness Pt is a 76 y/o male s/p L2-3 lumbar laminectomy/decompression. 8/11 underwent Evacuation of hematoma with placement of postoperative drain due to change in status.PMH includes HTN, DM, anxiety, and depression.    Clinical Impression   Pt seen for 2 visits (re-evaluation and treatment session with Dr. Rolena Infante). PTA pt modified independent with ADL and mobility - states he primarily used a cane. Pt demonstrates a significant functional decline compared to initial OT evaluation 01/22/17. Pt currently requires Max A +2 for stand pivot transfer to recliner and is currently unable to ambulate due to BLE weakness and sensory deficits. Pt will benefit from intensive rehab at CIR to facilitate safe DC home. Nsg notified of difficulty with mobility, incontinence and pt complaining that his RLE is weaker and that he is "unable to control how his leg moves" (poor proprioception). Nsg notified Dr. Rolena Infante. Will follow acutely to maximize functional level of independence and facilitate safe DC to next venue of care.      Follow Up Recommendations  Supervision/Assistance - 24 hour;CIR    Equipment Recommendations  3 in 1 bedside commode;Tub/shower bench;Wheelchair (measurements OT);Wheelchair cushion (measurements OT)    Recommendations for Other Services Rehab consult     Precautions / Restrictions Precautions Precautions: Back Precaution Booklet Issued: Yes (comment) Precaution Comments: Educated on back precuations Required Braces or Orthoses: Spinal Brace Spinal Brace:  (lumbar corsett not in room) Restrictions Weight Bearing Restrictions: No      Mobility Bed Mobility Overal bed mobility: Needs Assistance Bed Mobility: Rolling;Sidelying to Sit;Sit to Sidelying Rolling: Min assist Sidelying to sit: Min assist     Sit to sidelying:  Mod assist (A to lift BLE onto bed) General bed mobility comments: Cues for technique. heavy reliance on rails  Transfers Overall transfer level: Needs assistance Equipment used: Rolling walker (2 wheeled) Transfers: Sit to/from [facility] Sit to Stand: +2 physical assistance;Max assist Stand pivot transfers: +2 physical assistance;Max assist       General transfer comment: ataxic BLE - RLE worse than L  When pivoting to chair, pt crossing RLE over left foot and required tactile and verbal cues to increase awareness of position of RLE.    Balance Overall balance assessment: Needs assistance Sitting-balance support: Feet supported;Single extremity supported Sitting balance-Leahy Scale: Fair     Standing balance support: Bilateral upper extremity supported;During functional activity Standing balance-Leahy Scale: Zero Standing balance comment: RW for support; difficulty maintaing full upright posture                           ADL either performed or assessed with clinical judgement   ADL Overall ADL's : Needs assistance/impaired     Grooming: Sitting;Set up;Bed level   Upper Body Bathing: Set up;Bed level   Lower Body Bathing: Maximal assistance;Sit to/from stand   Upper Body Dressing : Minimal assistance;Sitting Upper Body Dressing Details (indicate cue type and reason): a Lower Body Dressing: Maximal assistance;Sit to/from stand Lower Body Dressing Details (indicate cue type and reason): with wife assisting Toilet Transfer: RW;BSC;+2 for physical assistance;Maximal assistance   Toileting- Clothing Manipulation and Hygiene: Total assistance Toileting - Clothing Manipulation Details (indicate cue type and reason): incontinent urine and BM       General ADL Comments: Pt unable to pivot/take steps to [facility]. Incontinenet during transfer; during second session, pt  able to stand stepto recliner with Max physical A +2. Pt unable to "feel where feet  are"     Vision         Perception     Praxis      Pertinent Vitals/Pain Pain Assessment: 0-10 Pain Score: 7  Pain Location: back spasms Pain Descriptors / Indicators: Aching;Grimacing;Sore Pain Intervention(s): Limited activity within patient's tolerance;Repositioned     Hand Dominance Right   Extremity/Trunk Assessment Upper Extremity Assessment Upper Extremity Assessment: Generalized weakness   Lower Extremity Assessment Lower Extremity Assessment: Defer to PT evaluation RLE Deficits / Details: Numbness reported into feet. Ataxic RLE. reports decrease in sensation. difficulty with proprioception. Unable to safely step with RLE LLE Deficits / Details: Numbness reported into feet; reports improved since surgery   Cervical / Trunk Assessment Cervical / Trunk Assessment: Other exceptions Cervical / Trunk Exceptions: s/p back surgery.    Communication Communication Communication: No difficulties   Cognition Arousal/Alertness: Awake/alert Behavior During Therapy: Anxious Overall Cognitive Status: Within Functional Limits for tasks assessed                                     General Comments       Exercises     Shoulder Instructions      Home Living Family/patient expects to be discharged to:: Private residence Living Arrangements: Spouse/significant other Available Help at Discharge: Family;Available 24 hours/day Type of Home: House Home Access: Stairs to enter [employer] of Steps: 6 Entrance Stairs-Rails: Right;Left Home Layout: One level     Bathroom Shower/Tub: Tub/shower unit;Curtain   Bathroom Toilet: Handicapped height     Home Equipment: Environmental consultant - 2 wheels;Cane - single point;Shower seat;Grab bars - tub/shower;Grab bars - toilet          Prior Functioning/Environment Level of Independence: Independent with assistive device(s)        Comments: Used cane at baseline         OT Problem List: Decreased  strength;Decreased activity tolerance;Impaired balance (sitting and/or standing);Decreased knowledge of use of DME or AE;Decreased knowledge of precautions;Obesity;Pain;Decreased range of motion;Decreased coordination;Decreased safety awareness;Impaired sensation;Impaired tone      OT Treatment/Interventions: Self-care/ADL training;Energy conservation;DME and/or AE instruction;Therapeutic activities;Patient/family education;Balance training;Therapeutic exercise    OT Goals(Current goals can be found in the care plan section) Acute Rehab OT Goals Patient Stated Goal: to return to normal OT Goal Formulation: With patient Time For Goal Achievement: 02/07/17 Potential to Achieve Goals: Good  OT Frequency: Min 3X/week   Barriers to D/C:            Co-evaluation              AM-PAC PT "6 Clicks" Daily Activity     Outcome Measure Help from another person eating meals?: None Help from another person taking care of personal grooming?: A Little Help from another person toileting, which includes using toliet, bedpan, or urinal?: Total Help from another person bathing (including washing, rinsing, drying)?: A Lot Help from another person to put on and taking off regular upper body clothing?: A Little Help from another person to put on and taking off regular lower body clothing?: A Lot 6 Click Score: 15   End of Session Equipment Utilized During Treatment: Gait belt;Rolling walker Nurse Communication: Mobility status;Other (comment) (current status)  Activity Tolerance: Patient tolerated treatment well (limited by c/o B LE numbness) Patient left: with call bell/phone within reach;in chair;with  chair alarm set  OT Visit Diagnosis: Unsteadiness on feet (R26.81);Other abnormalities of gait and mobility (R26.89);Muscle weakness (generalized) (M62.81);Pain;Ataxia, unspecified (R27.0) Pain - part of body:  (back)                Time: 6384-5364 OT Time Calculation (min): 44 min  2nd visit   Time: 6803-2122  Calculation (min): 15 min Charges:  OT General Charges $OT Visit:  (2 visits) OT Evaluation $OT Re-eval: 1 Procedure OT Treatments $Self Care/Home Management : 38-52 mins G-Codes:     [facility], OT/L  [phone] 01/24/2017  Alecsander Hattabaugh,HILLARY 01/24/2017, 1:39 PM

## 2017-01-24 NOTE — Progress Notes (Signed)
Patient alert and oriented x 4. Able to move all extremities at this time. Patient complains of less sensation in right leg. Patient pain controled with PO oxycodone. Patient foley D/C'ed at this time. Patietn was not ambulated over night d/t LLE weakness. Patient said his wife has back brace. Wife called, no answer, voicemail left for wife to return call, awaiting response. Nursing staff waiting on back brace to ambulate patient. Will continue to monitor patient.

## 2017-01-25 ENCOUNTER — Encounter (HOSPITAL_COMMUNITY): Payer: Self-pay | Admitting: Physical Medicine and Rehabilitation

## 2017-01-25 DIAGNOSIS — S064X9A Epidural hemorrhage with loss of consciousness of unspecified duration, initial encounter: Secondary | ICD-10-CM

## 2017-01-25 DIAGNOSIS — E871 Hypo-osmolality and hyponatremia: Secondary | ICD-10-CM

## 2017-01-25 DIAGNOSIS — F1010 Alcohol abuse, uncomplicated: Secondary | ICD-10-CM

## 2017-01-25 DIAGNOSIS — E1169 Type 2 diabetes mellitus with other specified complication: Secondary | ICD-10-CM

## 2017-01-25 DIAGNOSIS — D72829 Elevated white blood cell count, unspecified: Secondary | ICD-10-CM

## 2017-01-25 DIAGNOSIS — I10 Essential (primary) hypertension: Secondary | ICD-10-CM

## 2017-01-25 DIAGNOSIS — Z9889 Other specified postprocedural states: Secondary | ICD-10-CM

## 2017-01-25 DIAGNOSIS — D62 Acute posthemorrhagic anemia: Secondary | ICD-10-CM

## 2017-01-25 DIAGNOSIS — I152 Hypertension secondary to endocrine disorders: Secondary | ICD-10-CM

## 2017-01-25 DIAGNOSIS — G894 Chronic pain syndrome: Secondary | ICD-10-CM

## 2017-01-25 DIAGNOSIS — E669 Obesity, unspecified: Secondary | ICD-10-CM

## 2017-01-25 LAB — GLUCOSE, CAPILLARY
GLUCOSE-CAPILLARY: 133 mg/dL — AB (ref 65–99)
GLUCOSE-CAPILLARY: 99 mg/dL (ref 65–99)
GLUCOSE-CAPILLARY: 125 mg/dL — AB (ref 65–99)
Glucose-Capillary: 140 mg/dL — ABNORMAL HIGH (ref 65–99)
Glucose-Capillary: 190 mg/dL — ABNORMAL HIGH (ref 65–99)

## 2017-01-25 LAB — HEMOGLOBIN A1C
Hgb A1c MFr Bld: 6 % — ABNORMAL HIGH (ref 4.8–5.6)
Mean Plasma Glucose: 126 mg/dL

## 2017-01-25 MED ORDER — TAMSULOSIN HCL 0.4 MG PO CAPS
0.4000 mg | ORAL_CAPSULE | Freq: Every day | ORAL | Status: DC
  Administered 2017-01-25 – 2017-01-26 (×2): 0.4 mg via ORAL
  Filled 2017-01-25 (×2): qty 1

## 2017-01-25 NOTE — Care Management Important Message (Signed)
Important Message  Patient Details  Name: Donald Calderon MRN: [id number] Date of Birth: 02/21/1941   Medicare Important Message Given:  Yes    Orbie Pyo 01/25/2017, 11:33 AM

## 2017-01-25 NOTE — Care Management Note (Signed)
Case Management Note  Patient Details  Name: Donald Calderon MRN: [id number] Date of Birth: July 20, 1940  Subjective/Objective:                    Action/Plan: Recommendations are for CIR. CM following for d/c disposition.   Expected Discharge Date:   (Pending)               Expected Discharge Plan:  Home/Self Care  In-House Referral:  NA  Discharge planning Services  CM Consult  Post Acute Care Choice:  Home Health Choice offered to:  Patient, Spouse  DME Arranged:  N/A DME Agency:  NA  HH Arranged:  Patient Refused [facility] Agency:  NA  Status of Service:  Completed, signed off  If discussed at [place] of Stay Meetings, dates discussed:    Additional Comments:  Pollie Friar, RN 01/25/2017, 10:21 AM

## 2017-01-25 NOTE — Progress Notes (Signed)
Physical Therapy Treatment Patient Details Name: Donald Calderon MRN: [id number] DOB: 02-Feb-1941 Today's Date: 01/25/2017    History of Present Illness Pt is a 76 y/o male s/p L2-3 lumbar laminectomy/decompression. 8/11 underwent Evacuation of hematoma with placement of postoperative drain due to change in status.PMH includes HTN, DM, anxiety, and depression.     PT Comments    Pt continues to be very weak and unsteady on his feet.  He did successfully transfer to and from the [facility] with the RW (staff was using the steady, which is safer).  He had some incontinence of urine and thought he had to have a bowel movement, but did not.  He reports numbness of his genital area making it hard to tell when he has to go to the bathroom.  Pt continues to be appropriate for CIR level therapy at discharge and I believe we can progress gait away from the bed with chair to follow closely behind next session.   Follow Up Recommendations  CIR     Equipment Recommendations  None recommended by PT    Recommendations for Other Services   NA     Precautions / Restrictions Precautions Precautions: Fall;Back Precaution Comments: Reviewed 3/3 back precautions Pt was able to report 2/3 on his own. Required Braces or Orthoses: Spinal Brace Spinal Brace: Lumbar corset;Applied in sitting position    Mobility  Bed Mobility Overal bed mobility: Needs Assistance Bed Mobility: Rolling;Sidelying to Sit Rolling: Min assist Sidelying to sit: Min assist     Sit to sidelying: Mod assist General bed mobility comments: Min assist to help progress trunk and legs to EOB, mod assist to help lift both legs back up into the bed from sitting.  Log roll and reverse log roll technique used.   Transfers Overall transfer level: Needs assistance Equipment used: Rolling walker (2 wheeled) Transfers: Sit to/from [facility] Sit to Stand: +2 physical assistance;Mod assist Stand pivot transfers: +2 physical  assistance;Mod assist       General transfer comment: Two person mod assist to stand from elevated bed with RW, legs blocked for safety, verbal cues for safe hand placement.  Pt with poor control of legs and significant weakness.  We were only able to safely take a few steps around to the [facility] and back to the bed.  Incontinent bladder dribbling and inability to know if his bowels were moving.   Ambulation/Gait             General Gait Details: unable to safely progress at this time.           Balance Overall balance assessment: Needs assistance Sitting-balance support: Feet supported;Bilateral upper extremity supported Sitting balance-Leahy Scale: Fair     Standing balance support: Bilateral upper extremity supported Standing balance-Leahy Scale: Poor Standing balance comment: two person mod assist with RW.                              Cognition Arousal/Alertness: Awake/alert Behavior During Therapy: WFL for tasks assessed/performed Overall Cognitive Status: Within Functional Limits for tasks assessed                                               Pertinent Vitals/Pain Pain Assessment: Faces Faces Pain Scale: Hurts even more Pain Location: back Pain Descriptors / Indicators: Sore;Grimacing;Guarding  Pain Intervention(s): Limited activity within patient's tolerance;Monitored during session;Repositioned;Patient requesting pain meds-RN notified           PT Goals (current goals can now be found in the care plan section) Acute Rehab PT Goals Patient Stated Goal: to return to normal Progress towards PT goals: Progressing toward goals    Frequency    Min 5X/week      PT Plan Current plan remains appropriate       AM-PAC PT "6 Clicks" Daily Activity  Outcome Measure  Difficulty turning over in bed (including adjusting bedclothes, sheets and blankets)?: Total Difficulty moving from lying on back to sitting on the side of the bed?  : Total Difficulty sitting down on and standing up from a chair with arms (e.g., wheelchair, bedside commode, etc,.)?: Total Help needed moving to and from a bed to chair (including a wheelchair)?: A Lot Help needed walking in hospital room?: Total Help needed climbing 3-5 steps with a railing? : Total 6 Click Score: 7    End of Session Equipment Utilized During Treatment: Gait belt;Back brace Activity Tolerance: Patient limited by pain;Patient limited by fatigue Patient left: in bed;with call bell/phone within reach Nurse Communication: Mobility status;Patient requests pain meds PT Visit Diagnosis: Other abnormalities of gait and mobility (R26.89);Pain;Muscle weakness (generalized) (M62.81) Pain - Right/Left:  (lower) Pain - part of body:  (back)     Time: 2202-5427 PT Time Calculation (min) (ACUTE ONLY): 25 min  Charges:  $Therapeutic Activity: 23-37 mins          Donald Calderon B. Donald Calderon, [place], DPT [phone]            01/25/2017, 5:17 PM

## 2017-01-25 NOTE — Progress Notes (Addendum)
Patient unable to void large amounts. Bladder scan shows 986ml. On call notified. Order given to place urinary cath till pt is seen by provider. Foley placed. 1061ml urine emptied. Peri and foley care done. Will continue with care.

## 2017-01-25 NOTE — Progress Notes (Signed)
    Subjective: Procedure(s) (LRB): EXPLORATION AND EVACUATION OF HEMATOMA L2-L3 (N/A) 2 Days Post-Op  Patient reports pain as 2 on 0-10 scale.  Reports decreased leg pain denies incisional back pain   Foley placed due to difficulty with condom cath.  Bladder scan 950 - got 1 L out with foley Positive bowel movement - no  Further episodes of incontinence  Positive flatus Negative chest pain or shortness of breath  Objective: Vital signs in last 24 hours: Temp:  [97.8 F (36.6 C)-99.6 F (37.6 C)] 97.8 F (36.6 C) (08/13 0523) Pulse Rate:  [68-84] 77 (08/13 0523) Resp:  [15-18] 18 (08/13 0523) BP: (129-144)/(59-80) 138/67 (08/13 0523) SpO2:  [95 %-100 %] 97 % (08/13 0523)  Intake/Output from previous day: 08/12 0701 - 08/13 0700 In: 680 [P.O.:480; IV Piggyback:200] Out: 2020 [Urine:2000; Drains:20]  Labs:  Recent Labs  01/23/17 1315  WBC 11.1*  RBC 3.68*  HCT 33.5*  PLT 222    Recent Labs  01/23/17 1315  NA 134*  K 3.8  CL 98*  CO2 26  BUN 11  CREATININE 1.10  GLUCOSE 129*  CALCIUM 8.8*   No results for input(s): LABPT, INR in the last 72 hours.  Physical Exam: ABD soft Intact pulses distally Incision: dressing C/D/I Compartment soft Drain: 15 overnight  Assessment/Plan: Patient stable  xrays n/a Continue mobilization with physical therapy Continue care  Advance diet Up with therapy  Rehab consult pending Urine: will remove foley (concern for UTI) and start Flomax for hx of prostate issues and education about retention Bowel: monitor incontinence Less leg and back pain and low output from drain.  I do think the compression has been addressed - will monitor clinical exam.  I do think in time he will regain some/all function  Melina Schools, MD [place] [phone]

## 2017-01-25 NOTE — Progress Notes (Addendum)
Inpatient Rehabilitation  Met with patient to discuss team's recommendation for IP Rehab.  Shared booklets and answered questions.  I have also initiated insurance authorization and plan to follow up with patient tomorrow after he has discussed it with his spouse.  Will follow for timing of medical readiness, insurance authorization, patient's decision, and bed availability.  Please call with questions.  Update: Insurance has requested updated PT/OT clinicals and as a result acute has been called to notify of request for tomorrow, 01/26/17.   Carmelia Roller., CCC/SLP Admission Coordinator  [place]  Cell [phone]

## 2017-01-25 NOTE — Progress Notes (Signed)
SCANT UOP THROUGHOUT DAY, SMALL EPISODES INCONTINENCE. BALDDER SCANS HAVE REVELED PROGRESSIVELY INCREASING RETENTION VALUES. STRAIGHT CATH PERFORMED 1800 WITH 700 CLEAR YELLOW URINE RECORDED.

## 2017-01-25 NOTE — Consult Note (Signed)
Physical Medicine and Rehabilitation Consult   Reason for Consult: Central cord syndrome Referring Physician:  Dr. Rolena Infante.    HPI: Donald Calderon is a 76 y.o. 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 Rolena Infante. Post op course complicated by increase in leg pain with difficulty walking on 8/11. MRI lumbar spin reviewed, showing L2 hematoma with mass effect. Per report, fluid collection at L2 laminectomy site with severe central canal stenosis and mass effect felt to be due to hematoma. He was taken to OR that day for evacuation of hematoma. He has had issues with BLE weakness with ataxia, urinary retention due to neurogenic bladder and incontinence of bowel. Therapy ongoing and patient with significant decline in mobility as well as ability to carry out ADL tasks. CIR recommended for follow up therapy.    Review of Systems  Constitutional: Negative for chills and fever.  HENT: Negative for hearing loss.   Eyes: Positive for double vision.  Respiratory: Negative for cough and sputum production.   Cardiovascular: Positive for leg swelling. Negative for chest pain and palpitations.  Gastrointestinal: Positive for abdominal pain and constipation. Negative for heartburn and nausea.  Genitourinary: Negative for dysuria and urgency.  Musculoskeletal: Positive for back pain and falls (last fall a couple of months ago). Negative for neck pain.  Skin: Negative for itching and rash.  Neurological: Positive for sensory change, speech change and focal weakness. Negative for dizziness and headaches.  Psychiatric/Behavioral: The patient has insomnia.   All other systems reviewed and are negative.    Past Medical History:  Diagnosis Date  . ALCOHOL ABUSE   . Anxiety   . Arthritis   . Depression   . Diabetes  mellitus   . GERD (gastroesophageal reflux disease)   . Hypertension   . Pneumonia     Past Surgical History:  Procedure Laterality Date  . BACK SURGERY      disc fro picched nerve times 2  . EYE SURGERY Right    Gaucoma  . LUMBAR LAMINECTOMY/DECOMPRESSION MICRODISCECTOMY N/A 01/21/2017   Procedure: Decompression Lumbar 2-3;  Surgeon: Melina Schools, MD;  Location: [facility];  Service: Orthopedics;  Laterality: N/A;  210 mins  . LUMBAR LAMINECTOMY/DECOMPRESSION MICRODISCECTOMY N/A 01/23/2017   Procedure: EXPLORATION AND EVACUATION OF HEMATOMA L2-L3;  Surgeon: Melina Schools, MD;  Location: [facility];  Service: Orthopedics;  Laterality: N/A;  . PROSTATE SURGERY      Family History  Problem Relation Age of Onset  . Diabetes Mother   . Diabetes Father      Social History:  Married. Retired Radio broadcast assistant. He was independent with cane/walker PTA. Does not drive He reports that he quit smoking about 18 years ago. His smoking use included Cigarettes. He has a 39.00 pack-year smoking history. He quit smokeless tobacco use about 19 years ago. His smokeless tobacco use included Chew. He reports that he has cut down to drinking about 3-4 shots of liquor/daily as well as beer occasionally.  He reports that he does not use drugs.   Allergies  Allergen Reactions  . Penicillins Itching and Rash    Has patient had a PCN reaction causing immediate rash, facial/tongue/throat swelling, SOB or lightheadedness with hypotension: Yes Has patient had a PCN reaction causing severe rash involving mucus membranes or skin necrosis: No Has patient  had a PCN reaction that required hospitalization: No Has patient had a PCN reaction occurring within the last 10 years: No If all of the above answers are "NO", then may proceed with Cephalosporin use.      Medications Prior to Admission  Medication Sig Dispense Refill  . amLODipine (NORVASC) 10 MG tablet Take 1 tablet (10 mg total) by mouth daily. 30 tablet 11  .  BISACODYL 5 MG EC tablet Take 5 mg by mouth daily as needed for mild constipation.   0  . insulin aspart (NOVOLOG) 100 UNIT/ML injection Inject 4-10 Units into the skin 3 (three) times daily before meals. Per sliding scale    . LEVEMIR 100 UNIT/ML injection Inject 10-30 Units into the skin 2 (two) times daily. 10 units every morning, and 30 units every evening  2  . lisinopril (PRINIVIL,ZESTRIL) 20 MG tablet Take 1 tablet (20 mg total) by mouth daily. 30 tablet 11  . lisinopril-hydrochlorothiazide (PRINZIDE,ZESTORETIC) 20-25 MG tablet Take 1 tablet by mouth daily.  1  . naproxen sodium (ANAPROX) 220 MG tablet Take 220 mg by mouth 2 (two) times daily as needed (pain).    . pravastatin (PRAVACHOL) 10 MG tablet take 1 tablet by mouth daily in the evening  3  . dorzolamide-timolol (COSOPT) 22.3-6.8 MG/ML ophthalmic solution Place 1 drop into both eyes 2 (two) times daily.  6  . insulin glargine (LANTUS) 100 UNIT/ML injection Inject 40 Units into the skin at bedtime. 1000 mL 3  . LYRICA 100 MG capsule Take 100 mg by mouth 2 (two) times daily.  0  . potassium chloride (K-DUR) 10 MEQ tablet Take 1 tablet (10 mEq total) by mouth daily. 5 tablet 0    Home: Home Living Family/patient expects to be discharged to:: Private residence Living Arrangements: Spouse/significant other Available Help at Discharge: Family, Available 24 hours/day Type of Home: House Home Access: Stairs to enter [employer] of Steps: 6 Entrance Stairs-Rails: Right, Left Home Layout: One level Bathroom Shower/Tub: Tub/shower unit, Architectural technologist: Handicapped height Home Equipment: Environmental consultant - 2 wheels, [employer] - single point, Shower seat, Grab bars - tub/shower, Grab bars - toilet  Functional History: Prior Function Level of Independence: Independent with assistive device(s) Comments: Used cane at baseline  Functional Status:  Mobility: Bed Mobility Overal bed mobility: Needs Assistance Bed Mobility: Rolling,  Sidelying to Sit, Sit to Sidelying Rolling: Min assist Sidelying to sit: Min assist Sit to sidelying: Mod assist (A to lift BLE onto bed) General bed mobility comments: Pt in recliner. During OT session today, pt required min to mod assist with bed mobility. Transfers Overall transfer level: Needs assistance Equipment used: Rolling walker (2 wheeled) Transfers: Sit to/from Stand, Stand Pivot Transfers Sit to Stand: +2 physical assistance, Mod assist Stand pivot transfers: +2 physical assistance, Max assist General transfer comment: Sit to stand x 2 reps. Multimodal cues for upright posture. Heavy reliance on BUE through RW. Ambulation/Gait Ambulation/Gait assistance: Min guard Ambulation Distance (Feet): 50 Feet Assistive device: Rolling walker (2 wheeled) Gait Pattern/deviations: Step-through pattern, Decreased stride length, Step-to pattern, Trunk flexed General Gait Details: unable Gait velocity: Decreased Gait velocity interpretation: Below normal speed for age/gender    ADL: ADL Overall ADL's : Needs assistance/impaired Eating/Feeding: Set up, Sitting Grooming: Sitting, Set up, Bed level Upper Body Bathing: Set up, Bed level Lower Body Bathing: Maximal assistance, Sit to/from stand Upper Body Dressing : Minimal assistance, Sitting Upper Body Dressing Details (indicate cue type and reason): a Lower Body Dressing: Maximal  assistance, Sit to/from stand Lower Body Dressing Details (indicate cue type and reason): with wife assisting Toilet Transfer: RW, BSC, +2 for physical assistance, Maximal assistance Toilet Transfer Details (indicate cue type and reason): simulated by sit to stand from EOB with functional mobility in room Toileting- Clothing Manipulation and Hygiene: Total assistance Toileting - Clothing Manipulation Details (indicate cue type and reason): incontinent urine and BM Tub/Shower Transfer Details (indicate cue type and reason): Recommend not performing tub  transfer initially due to pain and poor standing balance; pt and wife report pt will sponge bathe initially Functional mobility during ADLs: Minimal assistance, Rolling walker General ADL Comments: Pt unable to pivot/take steps to [facility]. Incontinenet during transfer; during second session, pt able to stand stepto recliner with Max physical A +2. Pt unable to "feel where feet are"  Cognition: Cognition Overall Cognitive Status: Within Functional Limits for tasks assessed Orientation Level: Oriented X4 Cognition Arousal/Alertness: Awake/alert Behavior During Therapy: WFL for tasks assessed/performed Overall Cognitive Status: Within Functional Limits for tasks assessed   Blood pressure 138/67, pulse 77, temperature 97.8 F (36.6 C), temperature source Oral, resp. rate 18, height 5\' 9"  (1.753 m), weight 94.3 kg (208 lb), SpO2 97 %. Physical Exam  Nursing note and vitals reviewed. Constitutional: He is oriented to person, place, and time. He appears well-developed and well-nourished.  HENT:  Head: Normocephalic and atraumatic.  Mouth/Throat: Oropharynx is clear and moist.  Eyes: Pupils are equal, round, and reactive to light. Conjunctivae and EOM are normal.  Neck: Normal range of motion. Neck supple.  Cardiovascular: Normal rate and regular rhythm.   Respiratory: Effort normal and breath sounds normal. No stridor. No respiratory distress. He has no wheezes.  GI: He exhibits distension. There is no tenderness.  Musculoskeletal: He exhibits no edema or tenderness.  Neurological: He is alert and oriented to person, place, and time.  Motor: B/l UE 4+/5 proximal to distal LLE: 3/5 proximal to distal RLE: HF 3+/5, KE 3+/5, ADF/PF 4-/5 DTRs symmetric Sensation intact to light touch  Skin: Skin is warm and dry. No rash noted. No erythema.  Psychiatric: He has a normal mood and affect. His behavior is normal. Thought content normal.    Results for orders placed or performed during the hospital  encounter of 01/21/17 (from the past 24 hour(s))  Glucose, capillary     Status: Abnormal   Collection Time: 01/24/17 11:03 AM  Result Value Ref Range   Glucose-Capillary 222 (H) 65 - 99 mg/dL   Comment 1 Notify RN    Comment 2 Document in Chart   Glucose, capillary     Status: Abnormal   Collection Time: 01/24/17  4:21 PM  Result Value Ref Range   Glucose-Capillary 173 (H) 65 - 99 mg/dL   Comment 1 Notify RN    Comment 2 Document in Chart   Glucose, capillary     Status: Abnormal   Collection Time: 01/24/17  9:44 PM  Result Value Ref Range   Glucose-Capillary 215 (H) 65 - 99 mg/dL   Comment 1 Notify RN    Comment 2 Document in Chart   Glucose, capillary     Status: Abnormal   Collection Time: 01/25/17  6:24 AM  Result Value Ref Range   Glucose-Capillary 133 (H) 65 - 99 mg/dL   Comment 1 Notify RN    Comment 2 Document in Chart    Mr Lumbar Spine W Wo Contrast  Result Date: 01/23/2017 CLINICAL DATA:  Back pain. EXAM: MRI LUMBAR SPINE WITHOUT  AND WITH CONTRAST TECHNIQUE: Multiplanar and multiecho pulse sequences of the lumbar spine were obtained without and with intravenous contrast. CONTRAST:  <See Chart> MULTIHANCE GADOBENATE DIMEGLUMINE 529 MG/ML IV SOLN COMPARISON:  None. FINDINGS: Segmentation: 5 non rib-bearing lumbar type vertebral bodies are present. Alignment: AP alignment is anatomic. Leftward curvature is centered at L2-3. Vertebrae: Vertebral body heights are preserved. Heterogeneous marrow signal likely represents fatty infiltration. Conus medullaris: Extends to the T12 level and appears normal. Paraspinal and other soft tissues: Limited imaging of the abdomen is unremarkable. Disc levels: L1-2: Mild facet hypertrophy and disc bulging is present. There is no significant stenosis. L2-3: Interval laminectomy is noted. There is heterogeneous soft tissue within the operative site, likely hemorrhage extending 2.3 cm cephalo caudad. This results in severe central canal stenosis with  extrinsic compression on the thecal sac. This does not extend significantly above or below the disc space. Facet spurring contributes to mild right foraminal narrowing. L3-4: Profusion is noted. Mild right subarticular and foraminal stenosis is present. Right laminectomy is noted. L4-5: There is chronic loss of disc height. Right laminectomy is noted. There is thickening of nerve roots. Mild subarticular and foraminal narrowing is present bilaterally. L5-S1:  Moderate facet hypertrophy is noted bilaterally. IMPRESSION: 1. Heterogeneous fluid and soft tissue collection at the L2 laminectomy site resulting in severe central canal stenosis. This likely represents postoperative hematoma with significant mass effect. There is no extension beyond the L2-3 level. 2. Mild right foraminal narrowing at L2-3. 3. Right laminectomy and fusion at L3-4 with residual or recurrent mild right subarticular and foraminal stenosis. 4. Mild subarticular and foraminal narrowing bilaterally at L4-5. These results were called by telephone at the time of interpretation on 01/23/2017 at 12:10 pm to Dr. Melina Schools , who verbally acknowledged these results. Electronically Signed   By: San Morelle M.D.   On: 01/23/2017 12:19    Assessment/Plan: Diagnosis: Lumbar stenosis with hematoma s/p decompression Labs and images independently reviewed.  Records reviewed and summated above.  1. Does the need for close, 24 hr/day medical supervision in concert with the patient's rehab needs make it unreasonable for this patient to be served in a less intensive setting? Yes  2. 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 Due to bladder management, bowel management, safety, skin/wound care, disease management, pain management and patient education, does the patient require 24 hr/day rehab nursing? Yes 4. Does the patient require coordinated care of a physician, rehab nurse, PT (1-2 hrs/day, 5 days/week) and OT (1-2 hrs/day, 5 days/week) to address physical and functional deficits in the context of the above medical diagnosis(es)? Yes Addressing deficits in the following areas: balance, endurance, locomotion, strength, transferring, bowel/bladder control, bathing, dressing, toileting and psychosocial support 5. Can the patient actively participate in an intensive therapy program of at least 3 hrs of therapy per day at least  5 days per week? Yes 6. The potential for patient to make measurable gains while on inpatient rehab is excellent 7. Anticipated functional outcomes upon discharge from inpatient rehab are supervision and min assist  with PT, supervision and min assist with OT, n/a with SLP. 8. Estimated rehab length of stay to reach the above functional goals is: 16-20 days. 9. Anticipated D/C setting: Home 10. Anticipated post D/C treatments: HH therapy and Home excercise program 11. Overall Rehab/Functional Prognosis: good  RECOMMENDATIONS: This patient's condition is appropriate for continued rehabilitative care in the following setting: CIR Patient has agreed to participate in recommended program. Yes Note that insurance prior  authorization may be required for reimbursement for recommended care.  Comment: Rehab Admissions Coordinator to follow up.  Delice Lesch, MD, [address], [place] 01/25/2017

## 2017-01-26 ENCOUNTER — Encounter (HOSPITAL_COMMUNITY): Payer: Self-pay | Admitting: *Deleted

## 2017-01-26 ENCOUNTER — Inpatient Hospital Stay (HOSPITAL_COMMUNITY): Payer: Medicare HMO

## 2017-01-26 ENCOUNTER — Inpatient Hospital Stay (HOSPITAL_COMMUNITY)
Admission: RE | Admit: 2017-01-26 | Discharge: 2017-02-12 | DRG: 560 | Disposition: A | Discharge status: Home Health | Payer: Medicare HMO | Source: Intra-hospital | Attending: Physical Medicine & Rehabilitation | Admitting: Physical Medicine & Rehabilitation

## 2017-01-26 DIAGNOSIS — E119 Type 2 diabetes mellitus without complications: Secondary | ICD-10-CM | POA: Diagnosis present

## 2017-01-26 DIAGNOSIS — Z4789 Encounter for other orthopedic aftercare: Principal | ICD-10-CM

## 2017-01-26 DIAGNOSIS — E871 Hypo-osmolality and hyponatremia: Secondary | ICD-10-CM | POA: Diagnosis present

## 2017-01-26 DIAGNOSIS — R109 Unspecified abdominal pain: Secondary | ICD-10-CM

## 2017-01-26 DIAGNOSIS — D649 Anemia, unspecified: Secondary | ICD-10-CM | POA: Diagnosis present

## 2017-01-26 DIAGNOSIS — K5900 Constipation, unspecified: Secondary | ICD-10-CM | POA: Diagnosis present

## 2017-01-26 DIAGNOSIS — K644 Residual hemorrhoidal skin tags: Secondary | ICD-10-CM | POA: Diagnosis not present

## 2017-01-26 DIAGNOSIS — M5416 Radiculopathy, lumbar region: Secondary | ICD-10-CM | POA: Diagnosis present

## 2017-01-26 DIAGNOSIS — Z833 Family history of diabetes mellitus: Secondary | ICD-10-CM | POA: Diagnosis not present

## 2017-01-26 DIAGNOSIS — Z79899 Other long term (current) drug therapy: Secondary | ICD-10-CM

## 2017-01-26 DIAGNOSIS — R262 Difficulty in walking, not elsewhere classified: Secondary | ICD-10-CM | POA: Diagnosis present

## 2017-01-26 DIAGNOSIS — K567 Ileus, unspecified: Secondary | ICD-10-CM | POA: Diagnosis present

## 2017-01-26 DIAGNOSIS — Z87891 Personal history of nicotine dependence: Secondary | ICD-10-CM | POA: Diagnosis not present

## 2017-01-26 DIAGNOSIS — Z880 Allergy status to penicillin: Secondary | ICD-10-CM

## 2017-01-26 DIAGNOSIS — Z794 Long term (current) use of insulin: Secondary | ICD-10-CM | POA: Diagnosis not present

## 2017-01-26 DIAGNOSIS — E1121 Type 2 diabetes mellitus with diabetic nephropathy: Secondary | ICD-10-CM | POA: Diagnosis not present

## 2017-01-26 DIAGNOSIS — E1169 Type 2 diabetes mellitus with other specified complication: Secondary | ICD-10-CM | POA: Diagnosis present

## 2017-01-26 DIAGNOSIS — E1165 Type 2 diabetes mellitus with hyperglycemia: Secondary | ICD-10-CM | POA: Diagnosis not present

## 2017-01-26 DIAGNOSIS — G609 Hereditary and idiopathic neuropathy, unspecified: Secondary | ICD-10-CM | POA: Diagnosis present

## 2017-01-26 DIAGNOSIS — N319 Neuromuscular dysfunction of bladder, unspecified: Secondary | ICD-10-CM | POA: Diagnosis present

## 2017-01-26 DIAGNOSIS — Z9079 Acquired absence of other genital organ(s): Secondary | ICD-10-CM | POA: Diagnosis not present

## 2017-01-26 DIAGNOSIS — E1159 Type 2 diabetes mellitus with other circulatory complications: Secondary | ICD-10-CM | POA: Diagnosis present

## 2017-01-26 DIAGNOSIS — M48062 Spinal stenosis, lumbar region with neurogenic claudication: Secondary | ICD-10-CM

## 2017-01-26 DIAGNOSIS — K9189 Other postprocedural complications and disorders of digestive system: Secondary | ICD-10-CM

## 2017-01-26 DIAGNOSIS — R339 Retention of urine, unspecified: Secondary | ICD-10-CM | POA: Diagnosis not present

## 2017-01-26 DIAGNOSIS — I10 Essential (primary) hypertension: Secondary | ICD-10-CM | POA: Diagnosis present

## 2017-01-26 DIAGNOSIS — M48061 Spinal stenosis, lumbar region without neurogenic claudication: Secondary | ICD-10-CM | POA: Diagnosis present

## 2017-01-26 DIAGNOSIS — R269 Unspecified abnormalities of gait and mobility: Secondary | ICD-10-CM | POA: Diagnosis not present

## 2017-01-26 DIAGNOSIS — R338 Other retention of urine: Secondary | ICD-10-CM | POA: Diagnosis present

## 2017-01-26 DIAGNOSIS — D62 Acute posthemorrhagic anemia: Secondary | ICD-10-CM | POA: Diagnosis not present

## 2017-01-26 DIAGNOSIS — Z6829 Body mass index (BMI) 29.0-29.9, adult: Secondary | ICD-10-CM

## 2017-01-26 DIAGNOSIS — E669 Obesity, unspecified: Secondary | ICD-10-CM | POA: Diagnosis present

## 2017-01-26 LAB — GLUCOSE, CAPILLARY
GLUCOSE-CAPILLARY: 76 mg/dL (ref 65–99)
GLUCOSE-CAPILLARY: 140 mg/dL — AB (ref 65–99)
Glucose-Capillary: 194 mg/dL — ABNORMAL HIGH (ref 65–99)
Glucose-Capillary: 188 mg/dL — ABNORMAL HIGH (ref 65–99)

## 2017-01-26 LAB — VANCOMYCIN, TROUGH: VANCOMYCIN TR: 22 ug/mL — AB (ref 15–20)

## 2017-01-26 MED ORDER — PHENOL 1.4 % MT LIQD: 1.0000 | OROMUCOSAL | Status: DC | PRN

## 2017-01-26 MED ORDER — DIPHENHYDRAMINE HCL 12.5 MG/5ML PO ELIX: 12.5000 mg | ORAL_SOLUTION | Freq: Four times a day (QID) | ORAL | Status: DC | PRN

## 2017-01-26 MED ORDER — PROCHLORPERAZINE EDISYLATE 5 MG/ML IJ SOLN: 5.0000 mg | Freq: Four times a day (QID) | INTRAMUSCULAR | Status: DC | PRN

## 2017-01-26 MED ORDER — GUAIFENESIN-DM 100-10 MG/5ML PO SYRP: 5.0000 mL | ORAL_SOLUTION | Freq: Four times a day (QID) | ORAL | Status: DC | PRN

## 2017-01-26 MED ORDER — INSULIN DETEMIR 100 UNIT/ML ~~LOC~~ SOLN
30.0000 [IU] | Freq: Every day | SUBCUTANEOUS | Status: DC
  Administered 2017-01-26 – 2017-01-28 (×3): 30 [IU] via SUBCUTANEOUS
  Filled 2017-01-26 (×3): qty 0.3

## 2017-01-26 MED ORDER — LISINOPRIL 20 MG PO TABS
20.0000 mg | ORAL_TABLET | Freq: Every day | ORAL | Status: DC
  Administered 2017-01-27 – 2017-02-12 (×17): 20 mg via ORAL
  Filled 2017-01-26 (×17): qty 1

## 2017-01-26 MED ORDER — PROCHLORPERAZINE 25 MG RE SUPP: 12.5000 mg | Freq: Four times a day (QID) | RECTAL | Status: DC | PRN

## 2017-01-26 MED ORDER — DORZOLAMIDE HCL-TIMOLOL MAL 2-0.5 % OP SOLN
1.0000 [drp] | Freq: Two times a day (BID) | OPHTHALMIC | Status: DC
  Administered 2017-01-26 – 2017-02-11 (×12): 1 [drp] via OPHTHALMIC
  Filled 2017-01-26: qty 10

## 2017-01-26 MED ORDER — FLEET ENEMA 7-19 GM/118ML RE ENEM: 1.0000 | ENEMA | Freq: Once | RECTAL | Status: DC | PRN

## 2017-01-26 MED ORDER — HYDROCHLOROTHIAZIDE 25 MG PO TABS
25.0000 mg | ORAL_TABLET | Freq: Every day | ORAL | Status: DC
  Administered 2017-01-27 – 2017-02-12 (×17): 25 mg via ORAL
  Filled 2017-01-26 (×17): qty 1

## 2017-01-26 MED ORDER — MENTHOL 3 MG MT LOZG: 1.0000 | LOZENGE | OROMUCOSAL | Status: DC | PRN

## 2017-01-26 MED ORDER — TRAZODONE HCL 50 MG PO TABS
25.0000 mg | ORAL_TABLET | Freq: Every evening | ORAL | Status: DC | PRN
  Administered 2017-01-27 – 2017-02-03 (×5): 50 mg via ORAL
  Filled 2017-01-26 (×5): qty 1

## 2017-01-26 MED ORDER — INSULIN ASPART 100 UNIT/ML ~~LOC~~ SOLN
0.0000 [IU] | Freq: Every day | SUBCUTANEOUS | Status: DC
  Administered 2017-01-27 – 2017-02-06 (×7): 2 [IU] via SUBCUTANEOUS

## 2017-01-26 MED ORDER — CYCLOBENZAPRINE HCL 5 MG PO TABS
5.0000 mg | ORAL_TABLET | Freq: Three times a day (TID) | ORAL | Status: DC | PRN
  Administered 2017-01-28 – 2017-02-12 (×10): 5 mg via ORAL
  Filled 2017-01-26 (×11): qty 1

## 2017-01-26 MED ORDER — INSULIN DETEMIR 100 UNIT/ML ~~LOC~~ SOLN
10.0000 [IU] | Freq: Every day | SUBCUTANEOUS | Status: DC
  Administered 2017-01-27 – 2017-01-29 (×3): 10 [IU] via SUBCUTANEOUS
  Filled 2017-01-26 (×3): qty 0.1

## 2017-01-26 MED ORDER — VANCOMYCIN HCL IN DEXTROSE 750-5 MG/150ML-% IV SOLN: 750.0000 mg | Freq: Two times a day (BID) | INTRAVENOUS | Status: DC

## 2017-01-26 MED ORDER — PROCHLORPERAZINE MALEATE 5 MG PO TABS: 5.0000 mg | ORAL_TABLET | Freq: Four times a day (QID) | ORAL | Status: DC | PRN

## 2017-01-26 MED ORDER — TRAMADOL HCL 50 MG PO TABS
50.0000 mg | ORAL_TABLET | Freq: Four times a day (QID) | ORAL | Status: DC | PRN
  Administered 2017-01-30 – 2017-02-12 (×9): 50 mg via ORAL
  Filled 2017-01-26 (×9): qty 1

## 2017-01-26 MED ORDER — ACETAMINOPHEN 325 MG PO TABS
325.0000 mg | ORAL_TABLET | ORAL | Status: DC | PRN
  Administered 2017-02-05 – 2017-02-08 (×2): 325 mg via ORAL
  Filled 2017-01-26 (×2): qty 1

## 2017-01-26 MED ORDER — BISACODYL 10 MG RE SUPP: 10.0000 mg | Freq: Every day | RECTAL | Status: DC | PRN

## 2017-01-26 MED ORDER — PRAVASTATIN SODIUM 20 MG PO TABS
10.0000 mg | ORAL_TABLET | Freq: Every evening | ORAL | Status: DC
  Administered 2017-01-26 – 2017-02-11 (×17): 10 mg via ORAL
  Filled 2017-01-26 (×18): qty 1

## 2017-01-26 MED ORDER — ALUM & MAG HYDROXIDE-SIMETH 200-200-20 MG/5ML PO SUSP
30.0000 mL | ORAL | Status: DC | PRN
  Administered 2017-01-26 – 2017-02-01 (×3): 30 mL via ORAL
  Filled 2017-01-26 (×3): qty 30

## 2017-01-26 MED ORDER — INSULIN ASPART 100 UNIT/ML ~~LOC~~ SOLN
0.0000 [IU] | Freq: Three times a day (TID) | SUBCUTANEOUS | Status: DC
  Administered 2017-01-27: 3 [IU] via SUBCUTANEOUS
  Administered 2017-01-27 (×2): 2 [IU] via SUBCUTANEOUS
  Administered 2017-01-28 (×2): 3 [IU] via SUBCUTANEOUS
  Administered 2017-01-29: 5 [IU] via SUBCUTANEOUS
  Administered 2017-01-29 – 2017-01-31 (×4): 3 [IU] via SUBCUTANEOUS
  Administered 2017-02-01 (×2): 2 [IU] via SUBCUTANEOUS
  Administered 2017-02-02 – 2017-02-03 (×3): 3 [IU] via SUBCUTANEOUS
  Administered 2017-02-04 (×2): 2 [IU] via SUBCUTANEOUS
  Administered 2017-02-05: 5 [IU] via SUBCUTANEOUS
  Administered 2017-02-06 – 2017-02-08 (×3): 2 [IU] via SUBCUTANEOUS
  Administered 2017-02-08 – 2017-02-09 (×2): 3 [IU] via SUBCUTANEOUS

## 2017-01-26 MED ORDER — POLYETHYLENE GLYCOL 3350 17 G PO PACK: 17.0000 g | PACK | Freq: Every day | ORAL | Status: DC | PRN

## 2017-01-26 MED ORDER — FLEET ENEMA 7-19 GM/118ML RE ENEM
1.0000 | ENEMA | Freq: Once | RECTAL | Status: AC
  Administered 2017-01-26: 1 via RECTAL

## 2017-01-26 MED ORDER — TAMSULOSIN HCL 0.4 MG PO CAPS
0.4000 mg | ORAL_CAPSULE | Freq: Every day | ORAL | Status: DC
  Administered 2017-01-27 – 2017-01-31 (×5): 0.4 mg via ORAL
  Filled 2017-01-26 (×5): qty 1

## 2017-01-26 MED ORDER — POLYETHYLENE GLYCOL 3350 17 G PO PACK
17.0000 g | PACK | Freq: Two times a day (BID) | ORAL | Status: DC
  Administered 2017-01-26 – 2017-02-11 (×26): 17 g via ORAL
  Filled 2017-01-26 (×28): qty 1

## 2017-01-26 MED ORDER — LIDOCAINE HCL 2 % EX GEL: CUTANEOUS | Status: DC | PRN

## 2017-01-26 MED ORDER — HYDROCODONE-ACETAMINOPHEN 10-325 MG PO TABS
1.0000 | ORAL_TABLET | ORAL | Status: DC | PRN
  Administered 2017-01-27 – 2017-02-11 (×35): 1 via ORAL
  Filled 2017-01-26 (×36): qty 1

## 2017-01-26 MED ORDER — AMLODIPINE BESYLATE 10 MG PO TABS
10.0000 mg | ORAL_TABLET | Freq: Every day | ORAL | Status: DC
  Administered 2017-01-27 – 2017-02-12 (×17): 10 mg via ORAL
  Filled 2017-01-26 (×17): qty 1

## 2017-01-26 NOTE — Progress Notes (Signed)
Physical Therapy Treatment Patient Details Name: Donald Calderon MRN: [id number] DOB: 1940-11-17 Today's Date: 01/26/2017    History of Present Illness Pt is a 76 y/o male s/p L2-3 lumbar laminectomy/decompression. 8/11 underwent Evacuation of hematoma with placement of postoperative drain due to change in status.PMH includes HTN, DM, anxiety, and depression.     PT Comments    Pt was able to walk into the hallway today with RW and chair to follow.  He has ataxic gait pattern, near scissoring and is heavily reliant on his arms for support.  We stopped first on the [facility] as pt has significant difficulty knowing when he needs to go and if he is going to the bathroom (both bowel and bladder incontinence with decreased sensation around his genitals).  He remains appropriate for CIR level therapies at discharge.    Follow Up Recommendations  CIR     Equipment Recommendations  None recommended by PT    Recommendations for Other Services   NA     Precautions / Restrictions Precautions Precautions: Fall;Back Precaution Comments: Pt is able to report 3/3 back precautions  Required Braces or Orthoses: Spinal Brace Spinal Brace: Lumbar corset;Applied in sitting position Restrictions Weight Bearing Restrictions: No    Mobility  Bed Mobility Overal bed mobility: Needs Assistance Bed Mobility: Rolling;Sidelying to Sit Rolling: Min assist Sidelying to sit: Min assist       General bed mobility comments: Min assist to support trunk and assist in flexing bil knees to log roll to the left.  Assist needed to support trunk during transitions to sit EOB.    Transfers Overall transfer level: Needs assistance Equipment used: Rolling walker (2 wheeled) Transfers: Sit to/from [facility] Sit to Stand: +2 safety/equipment;Mod assist;From elevated surface Stand pivot transfers: +2 physical assistance;Mod assist;From elevated surface       General transfer comment: Two person mod  assist for safety and equipment management to stand from elevated bed supporting trunk during transitions.  Verbal cues for safe hand placement.    Ambulation/Gait Ambulation/Gait assistance: Mod assist;+2 safety/equipment Ambulation Distance (Feet): 20 Feet Assistive device: Rolling walker (2 wheeled) Gait Pattern/deviations: Step-to pattern;Ataxic;Trunk flexed;Scissoring;Narrow base of support Gait velocity: Decreased Gait velocity interpretation: Below normal speed for age/gender General Gait Details: Pt with ataxic gait, nearly scissoring pattern.  Heavy reliance on bil upper extremity support on RW, verbal cues for upright posture and wider BOS.  Chair to follow for safety and to encourage increased gait distance.           Balance Overall balance assessment: Needs assistance Sitting-balance support: Feet supported;Bilateral upper extremity supported Sitting balance-Leahy Scale: Fair     Standing balance support: Bilateral upper extremity supported Standing balance-Leahy Scale: Poor Standing balance comment: two person mod assist with RW.                              Cognition Arousal/Alertness: Awake/alert Behavior During Therapy: WFL for tasks assessed/performed Overall Cognitive Status: Within Functional Limits for tasks assessed                                               Pertinent Vitals/Pain Pain Assessment: 0-10 Pain Score: 7  Pain Location: back Pain Descriptors / Indicators: Sore;Grimacing;Guarding Pain Intervention(s): Limited activity within patient's tolerance;Monitored during session;Repositioned  Prior Function Level of Independence: Independent with assistive device(s)      Comments: Used cane at baseline    PT Goals (current goals can now be found in the care plan section) Acute Rehab PT Goals Patient Stated Goal: to return to normal Progress towards PT goals: Progressing toward goals    Frequency    Min  5X/week      PT Plan Current plan remains appropriate       AM-PAC PT "6 Clicks" Daily Activity  Outcome Measure  Difficulty turning over in bed (including adjusting bedclothes, sheets and blankets)?: Total Difficulty moving from lying on back to sitting on the side of the bed? : Total Difficulty sitting down on and standing up from a chair with arms (e.g., wheelchair, bedside commode, etc,.)?: Total Help needed moving to and from a bed to chair (including a wheelchair)?: A Lot Help needed walking in hospital room?: A Lot Help needed climbing 3-5 steps with a railing? : Total 6 Click Score: 8    End of Session Equipment Utilized During Treatment: Gait belt;Back brace Activity Tolerance: Patient limited by pain Patient left: in chair;with call bell/phone within reach;with chair alarm set;with family/visitor present;Other (comment) (with rehab admissions coordinator present.)   PT Visit Diagnosis: Other abnormalities of gait and mobility (R26.89);Pain;Muscle weakness (generalized) (M62.81) Pain - Right/Left:  (back) Pain - part of body:  (lower back)     Time: 4599-7741 PT Time Calculation (min) (ACUTE ONLY): 22 min  Charges:  $Gait Training: 8-22 mins          Cobin Cadavid B. [place], [place], DPT [phone]            01/26/2017, 5:10 PM

## 2017-01-26 NOTE — Progress Notes (Signed)
Donald Arn, MD Physician Signed Physical Medicine and Rehabilitation  Consult Note Date of Service: 01/25/2017 10:28 AM  Related encounter: Admission (Discharged) from 01/21/2017 in [place] All Collapse All   [] Hide copied text [] Hover for attribution information      Physical Medicine and Rehabilitation Consult   Reason for Consult: Central cord syndrome Referring Physician:  Dr. Rolena Infante.    HPI: Donald Calderon is a 76 y.o. 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 Rolena Infante. Post op course complicated by increase in leg pain with difficulty walking on 8/11. MRI lumbar spin reviewed, showing L2 hematoma with mass effect. Per report, fluid collection at L2 laminectomy site with severe central canal stenosis and mass effect felt to be due to hematoma. He was taken to OR that day for evacuation of hematoma. He has had issues with BLE weakness with ataxia, urinary retention due to neurogenic bladder and incontinence of bowel. Therapy ongoing and patient with significant decline in mobility as well as ability to carry out ADL tasks. CIR recommended for follow up therapy.    Review of Systems  Constitutional: Negative for chills and fever.  HENT: Negative for hearing loss.   Eyes: Positive for double vision.  Respiratory: Negative for cough and sputum production.   Cardiovascular: Positive for leg swelling. Negative for chest pain and palpitations.  Gastrointestinal: Positive for abdominal pain and constipation. Negative for heartburn and nausea.  Genitourinary: Negative for dysuria and urgency.  Musculoskeletal: Positive for back pain and falls (last fall a couple of months ago). Negative for neck pain.  Skin: Negative for itching and rash.    Neurological: Positive for sensory change, speech change and focal weakness. Negative for dizziness and headaches.  Psychiatric/Behavioral: The patient has insomnia.   All other systems reviewed and are negative.        Past Medical History:  Diagnosis Date  . ALCOHOL ABUSE   . Anxiety   . Arthritis   . Depression   . Diabetes mellitus   . GERD (gastroesophageal reflux disease)   . Hypertension   . Pneumonia          Past Surgical History:  Procedure Laterality Date  . BACK SURGERY      disc fro picched nerve times 2  . EYE SURGERY Right    Gaucoma  . LUMBAR LAMINECTOMY/DECOMPRESSION MICRODISCECTOMY N/A 01/21/2017   Procedure: Decompression Lumbar 2-3;  Surgeon: Melina Schools, MD;  Location: [place];  Service: Orthopedics;  Laterality: N/A;  210 mins  . LUMBAR LAMINECTOMY/DECOMPRESSION MICRODISCECTOMY N/A 01/23/2017   Procedure: EXPLORATION AND EVACUATION OF HEMATOMA L2-L3;  Surgeon: Melina Schools, MD;  Location: [place];  Service: Orthopedics;  Laterality: N/A;  . PROSTATE SURGERY           Family History  Problem Relation Age of Onset  . Diabetes Mother   . Diabetes Father      Social History:  Married. Retired Radio broadcast assistant. He was independent with cane/walker PTA. Does not drive He reports that he quit smoking about 18 years ago. His smoking use included Cigarettes. He has a 39.00 pack-year smoking history. He quit smokeless tobacco use about 19 years ago. His smokeless tobacco use included Chew. He reports that he has cut down to drinking about  3-4 shots of liquor/daily as well as beer occasionally.  He reports that he does not use drugs.        Allergies  Allergen Reactions  . Penicillins Itching and Rash    Has patient had a PCN reaction causing immediate rash, facial/tongue/throat swelling, SOB or lightheadedness with hypotension: Yes Has patient had a PCN reaction causing severe rash involving mucus membranes or skin necrosis:  No Has patient had a PCN reaction that required hospitalization: No Has patient had a PCN reaction occurring within the last 10 years: No If all of the above answers are "NO", then may proceed with Cephalosporin use.            Medications Prior to Admission  Medication Sig Dispense Refill  . amLODipine (NORVASC) 10 MG tablet Take 1 tablet (10 mg total) by mouth daily. 30 tablet 11  . BISACODYL 5 MG EC tablet Take 5 mg by mouth daily as needed for mild constipation.   0  . insulin aspart (NOVOLOG) 100 UNIT/ML injection Inject 4-10 Units into the skin 3 (three) times daily before meals. Per sliding scale    . LEVEMIR 100 UNIT/ML injection Inject 10-30 Units into the skin 2 (two) times daily. 10 units every morning, and 30 units every evening  2  . lisinopril (PRINIVIL,ZESTRIL) 20 MG tablet Take 1 tablet (20 mg total) by mouth daily. 30 tablet 11  . lisinopril-hydrochlorothiazide (PRINZIDE,ZESTORETIC) 20-25 MG tablet Take 1 tablet by mouth daily.  1  . naproxen sodium (ANAPROX) 220 MG tablet Take 220 mg by mouth 2 (two) times daily as needed (pain).    . pravastatin (PRAVACHOL) 10 MG tablet take 1 tablet by mouth daily in the evening  3  . dorzolamide-timolol (COSOPT) 22.3-6.8 MG/ML ophthalmic solution Place 1 drop into both eyes 2 (two) times daily.  6  . insulin glargine (LANTUS) 100 UNIT/ML injection Inject 40 Units into the skin at bedtime. 1000 mL 3  . LYRICA 100 MG capsule Take 100 mg by mouth 2 (two) times daily.  0  . potassium chloride (K-DUR) 10 MEQ tablet Take 1 tablet (10 mEq total) by mouth daily. 5 tablet 0    Home: Home Living Family/patient expects to be discharged to:: Private residence Living Arrangements: Spouse/significant other Available Help at Discharge: Family, Available 24 hours/day Type of Home: House Home Access: Stairs to enter [employer] of Steps: 6 Entrance Stairs-Rails: Right, Left Home Layout: One level Bathroom Shower/Tub:  Tub/shower unit, Architectural technologist: Handicapped height Home Equipment: Environmental consultant - 2 wheels, [employer] - single point, Shower seat, Grab bars - tub/shower, Grab bars - toilet  Functional History: Prior Function Level of Independence: Independent with assistive device(s) Comments: Used cane at baseline  Functional Status:  Mobility: Bed Mobility Overal bed mobility: Needs Assistance Bed Mobility: Rolling, Sidelying to Sit, Sit to Sidelying Rolling: Min assist Sidelying to sit: Min assist Sit to sidelying: Mod assist (A to lift BLE onto bed) General bed mobility comments: Pt in recliner. During OT session today, pt required min to mod assist with bed mobility. Transfers Overall transfer level: Needs assistance Equipment used: Rolling walker (2 wheeled) Transfers: Sit to/from Stand, Stand Pivot Transfers Sit to Stand: +2 physical assistance, Mod assist Stand pivot transfers: +2 physical assistance, Max assist General transfer comment: Sit to stand x 2 reps. Multimodal cues for upright posture. Heavy reliance on BUE through RW. Ambulation/Gait Ambulation/Gait assistance: Min guard Ambulation Distance (Feet): 50 Feet Assistive device: Rolling walker (2 wheeled) Gait Pattern/deviations: Step-through  pattern, Decreased stride length, Step-to pattern, Trunk flexed General Gait Details: unable Gait velocity: Decreased Gait velocity interpretation: Below normal speed for age/gender  ADL: ADL Overall ADL's : Needs assistance/impaired Eating/Feeding: Set up, Sitting Grooming: Sitting, Set up, Bed level Upper Body Bathing: Set up, Bed level Lower Body Bathing: Maximal assistance, Sit to/from stand Upper Body Dressing : Minimal assistance, Sitting Upper Body Dressing Details (indicate cue type and reason): a Lower Body Dressing: Maximal assistance, Sit to/from stand Lower Body Dressing Details (indicate cue type and reason): with wife assisting Toilet Transfer: RW, BSC, +2 for physical  assistance, Maximal assistance Toilet Transfer Details (indicate cue type and reason): simulated by sit to stand from EOB with functional mobility in room Toileting- Clothing Manipulation and Hygiene: Total assistance Toileting - Clothing Manipulation Details (indicate cue type and reason): incontinent urine and BM Tub/Shower Transfer Details (indicate cue type and reason): Recommend not performing tub transfer initially due to pain and poor standing balance; pt and wife report pt will sponge bathe initially Functional mobility during ADLs: Minimal assistance, Rolling walker General ADL Comments: Pt unable to pivot/take steps to [facility]. Incontinenet during transfer; during second session, pt able to stand stepto recliner with Max physical A +2. Pt unable to "feel where feet are"  Cognition: Cognition Overall Cognitive Status: Within Functional Limits for tasks assessed Orientation Level: Oriented X4 Cognition Arousal/Alertness: Awake/alert Behavior During Therapy: WFL for tasks assessed/performed Overall Cognitive Status: Within Functional Limits for tasks assessed   Blood pressure 138/67, pulse 77, temperature 97.8 F (36.6 C), temperature source Oral, resp. rate 18, height 5\' 9"  (1.753 m), weight 94.3 kg (208 lb), SpO2 97 %. Physical Exam  Nursing note and vitals reviewed. Constitutional: He is oriented to person, place, and time. He appears well-developed and well-nourished.  HENT:  Head: Normocephalic and atraumatic.  Mouth/Throat: Oropharynx is clear and moist.  Eyes: Pupils are equal, round, and reactive to light. Conjunctivae and EOM are normal.  Neck: Normal range of motion. Neck supple.  Cardiovascular: Normal rate and regular rhythm.   Respiratory: Effort normal and breath sounds normal. No stridor. No respiratory distress. He has no wheezes.  GI: He exhibits distension. There is no tenderness.  Musculoskeletal: He exhibits no edema or tenderness.  Neurological: He is alert  and oriented to person, place, and time.  Motor: B/l UE 4+/5 proximal to distal LLE: 3/5 proximal to distal RLE: HF 3+/5, KE 3+/5, ADF/PF 4-/5 DTRs symmetric Sensation intact to light touch  Skin: Skin is warm and dry. No rash noted. No erythema.  Psychiatric: He has a normal mood and affect. His behavior is normal. Thought content normal.    Lab Results Last 24 Hours       Results for orders placed or performed during the hospital encounter of 01/21/17 (from the past 24 hour(s))  Glucose, capillary     Status: Abnormal   Collection Time: 01/24/17 11:03 AM  Result Value Ref Range   Glucose-Capillary 222 (H) 65 - 99 mg/dL   Comment 1 Notify RN    Comment 2 Document in Chart   Glucose, capillary     Status: Abnormal   Collection Time: 01/24/17  4:21 PM  Result Value Ref Range   Glucose-Capillary 173 (H) 65 - 99 mg/dL   Comment 1 Notify RN    Comment 2 Document in Chart   Glucose, capillary     Status: Abnormal   Collection Time: 01/24/17  9:44 PM  Result Value Ref Range   Glucose-Capillary 215 (  H) 65 - 99 mg/dL   Comment 1 Notify RN    Comment 2 Document in Chart   Glucose, capillary     Status: Abnormal   Collection Time: 01/25/17  6:24 AM  Result Value Ref Range   Glucose-Capillary 133 (H) 65 - 99 mg/dL   Comment 1 Notify RN    Comment 2 Document in Chart       Imaging Results (Last 48 hours)  Mr Lumbar Spine W Wo Contrast  Result Date: 01/23/2017 CLINICAL DATA:  Back pain. EXAM: MRI LUMBAR SPINE WITHOUT AND WITH CONTRAST TECHNIQUE: Multiplanar and multiecho pulse sequences of the lumbar spine were obtained without and with intravenous contrast. CONTRAST:  <See Chart> MULTIHANCE GADOBENATE DIMEGLUMINE 529 MG/ML IV SOLN COMPARISON:  None. FINDINGS: Segmentation: 5 non rib-bearing lumbar type vertebral bodies are present. Alignment: AP alignment is anatomic. Leftward curvature is centered at L2-3. Vertebrae: Vertebral body heights are preserved.  Heterogeneous marrow signal likely represents fatty infiltration. Conus medullaris: Extends to the T12 level and appears normal. Paraspinal and other soft tissues: Limited imaging of the abdomen is unremarkable. Disc levels: L1-2: Mild facet hypertrophy and disc bulging is present. There is no significant stenosis. L2-3: Interval laminectomy is noted. There is heterogeneous soft tissue within the operative site, likely hemorrhage extending 2.3 cm cephalo caudad. This results in severe central canal stenosis with extrinsic compression on the thecal sac. This does not extend significantly above or below the disc space. Facet spurring contributes to mild right foraminal narrowing. L3-4: Profusion is noted. Mild right subarticular and foraminal stenosis is present. Right laminectomy is noted. L4-5: There is chronic loss of disc height. Right laminectomy is noted. There is thickening of nerve roots. Mild subarticular and foraminal narrowing is present bilaterally. L5-S1:  Moderate facet hypertrophy is noted bilaterally. IMPRESSION: 1. Heterogeneous fluid and soft tissue collection at the L2 laminectomy site resulting in severe central canal stenosis. This likely represents postoperative hematoma with significant mass effect. There is no extension beyond the L2-3 level. 2. Mild right foraminal narrowing at L2-3. 3. Right laminectomy and fusion at L3-4 with residual or recurrent mild right subarticular and foraminal stenosis. 4. Mild subarticular and foraminal narrowing bilaterally at L4-5. These results were called by telephone at the time of interpretation on 01/23/2017 at 12:10 pm to Dr. Melina Schools , who verbally acknowledged these results. Electronically Signed   By: San Morelle M.D.   On: 01/23/2017 12:19     Assessment/Plan: Diagnosis: Lumbar stenosis with hematoma s/p decompression Labs and images independently reviewed.  Records reviewed and summated above.  1. Does the need for close, 24  hr/day medical supervision in concert with the patient's rehab needs make it unreasonable for this patient to be served in a less intensive setting? Yes  2. 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 Due to bladder management, bowel management,  safety, skin/wound care, disease management, pain management and patient education, does the patient require 24 hr/day rehab nursing? Yes 4. Does the patient require coordinated care of a physician, rehab nurse, PT (1-2 hrs/day, 5 days/week) and OT (1-2 hrs/day, 5 days/week) to address physical and functional deficits in the context of the above medical diagnosis(es)? Yes Addressing deficits in the following areas: balance, endurance, locomotion, strength, transferring, bowel/bladder control, bathing, dressing, toileting and psychosocial support 5. Can the patient actively participate in an intensive therapy program of at least 3 hrs of therapy per day at least 5 days per week? Yes 6. The potential for patient to make measurable gains while on inpatient rehab is excellent 7. Anticipated functional outcomes upon discharge from inpatient rehab are supervision and min assist   with PT, supervision and min assist with OT, n/a with SLP. 8. Estimated rehab length of stay to reach the above functional goals is: 16-20 days. 9. Anticipated D/C setting: Home 10. Anticipated post D/C treatments: HH therapy and Home excercise program 11. Overall Rehab/Functional Prognosis: good  RECOMMENDATIONS: This patient's condition is appropriate for continued rehabilitative care in the following setting: CIR Patient has agreed to participate in recommended program. Yes Note that insurance prior authorization may be required for reimbursement for recommended care.  Comment: Rehab Admissions Coordinator to follow up.  Delice Lesch, MD, Mellody Drown Bary [place], [place] 01/25/2017    Revision History                        Routing History

## 2017-01-26 NOTE — Evaluation (Signed)
Occupational Therapy Treatment Patient Details Name: Donald Calderon MRN: [id number] DOB: [birth year] Today's Date: 01/26/2017    History of present illness Pt is a 76 y/o male s/p L2-3 lumbar laminectomy/decompression. 8/11 underwent Evacuation of hematoma with placement of postoperative drain due to change in status.PMH includes HTN, DM, anxiety, and depression.    OT comments  Pt making progress towards goals. Pt verbalizes 3/3 back precautions, completed sit<>stand at RW and stand pivot transfer to recliner with ModA+2 this session. Pt with incontinent BM episode while standing during session (unaware of having BM), requires total assist for perihygiene with second person assisting to steady in standing during completion. Pt with increased pain during mobility however is motivated to work with therapy and progress OOB to chair. Pt will continue to benefit from acute OT services and will benefit from additional OT services in CIR setting to maximize Pt's safety and independence with ADLs and functional mobility.    Follow Up Recommendations  Supervision/Assistance - 24 hour;CIR    Equipment Recommendations  3 in 1 bedside commode;Tub/shower bench;Wheelchair (measurements OT);Wheelchair cushion (measurements OT)    Recommendations for Other Services Rehab consult    Precautions / Restrictions Precautions Precautions: Fall;Back Precaution Comments: Pt is able to report 3/3 back precautions  Required Braces or Orthoses: Spinal Brace Spinal Brace: Lumbar corset;Applied in sitting position Restrictions Weight Bearing Restrictions: No       Mobility Bed Mobility Overal bed mobility: Needs Assistance Bed Mobility: Rolling;Sidelying to Sit Rolling: Min assist Sidelying to sit: Max assist       General bed mobility comments: utilized log roll technique (Pt able to verbalize technique); requires MaxA for assisting LEs over EOB and bringing trunk into upright position during sidelying to  sit  Transfers Overall transfer level: Needs assistance Equipment used: Rolling walker (2 wheeled) Transfers: Sit to/from [facility] Sit to Stand: +2 physical assistance;Mod assist Stand pivot transfers: +2 physical assistance;Mod assist       General transfer comment: cues for hand placement, +2 ModA to rise and steady in standing, +2ModA for taking steps to pivot to recliner, Pt with decreased awareness/control of LE placement during mobility     Balance Overall balance assessment: Needs assistance Sitting-balance support: Feet supported;Bilateral upper extremity supported Sitting balance-Leahy Scale: Fair     Standing balance support: Bilateral upper extremity supported Standing balance-Leahy Scale: Poor Standing balance comment: two person mod assist with RW.                             ADL either performed or assessed with clinical judgement   ADL Overall ADL's : Needs assistance/impaired     Grooming: Set up;Sitting                   Toilet Transfer: Maximal assistance;+2 for safety/equipment;+2 for physical assistance;RW Toilet Transfer Details (indicate cue type and reason): Pt stood at RW to use urinal, unable to void bladder but is incontinent with BM during standing Toileting- Clothing Manipulation and Hygiene: Total assistance;+2 for physical assistance;+2 for safety/equipment Toileting - Clothing Manipulation Details (indicate cue type and reason): incontinent BM during session      Functional mobility during ADLs: Moderate assistance;+2 for physical assistance;+2 for safety/equipment;Rolling walker General ADL Comments: Pt requires ModA +2 for completing stand pivot to recliner from EOB; requires multimodal cues for LE placement as Pt with decreased awareness of LE placement during mobility  Cognition Arousal/Alertness: Awake/alert Behavior During Therapy: WFL for tasks  assessed/performed Overall Cognitive Status: Within Functional Limits for tasks assessed                                                            Pertinent Vitals/ Pain       Pain Assessment: 0-10 Pain Score: 7  Pain Location: back Pain Descriptors / Indicators: Sore;Grimacing;Guarding Pain Intervention(s): Limited activity within patient's tolerance;Monitored during session;Repositioned                                                          Frequency  Min 3X/week        Progress Toward Goals  OT Goals(current goals can now be found in the care plan section)  Progress towards OT goals: Progressing toward goals  Acute Rehab OT Goals Patient Stated Goal: to return to normal OT Goal Formulation: With patient Time For Goal Achievement: 02/07/17 Potential to Achieve Goals: Good  Plan Discharge plan remains appropriate                     AM-PAC PT "6 Clicks" Daily Activity     Outcome Measure   Help from another person eating meals?: None Help from another person taking care of personal grooming?: A Little Help from another person toileting, which includes using toliet, bedpan, or urinal?: Total Help from another person bathing (including washing, rinsing, drying)?: A Lot Help from another person to put on and taking off regular upper body clothing?: A Little Help from another person to put on and taking off regular lower body clothing?: A Lot 6 Click Score: 15    End of Session Equipment Utilized During Treatment: Gait belt;Rolling walker;Back brace  OT Visit Diagnosis: Unsteadiness on feet (R26.81);Other abnormalities of gait and mobility (R26.89);Muscle weakness (generalized) (M62.81);Pain;Ataxia, unspecified (R27.0) Pain - part of body:  (back )   Activity Tolerance Patient tolerated treatment well   Patient Left in chair;with call bell/phone within reach;with chair alarm set;with family/visitor  present   Nurse Communication Mobility status        Time: 9563-8756 OT Time Calculation (min): 21 min  Charges: OT General Charges $OT Visit: 1 Procedure OT Treatments $Self Care/Home Management : 8-22 mins  Lou Cal, OT Pager [phone] 01/26/2017   Donald Calderon 01/26/2017, 3:37 PM

## 2017-01-26 NOTE — Progress Notes (Signed)
Pt admitted to unit at 1745. Rn educated on rehab schedule and booklet. RN educated on neurogenic bowel and bladder with pt needing continued education. I&O cathed at 1700 on prior unit for 900cc and small incont. Bowel movement earlier today. Pt received miralaxx as ordered and fleets enema scheduled at 10pm.  Pt has honey comb dressing in place to back with a new foam placed over the old drain site. Pt complains of numbness and tingling to BLE. Small area to bottom of right great toe. Call bell within reach.

## 2017-01-26 NOTE — Progress Notes (Signed)
    Subjective: Procedure(s) (LRB): EXPLORATION AND EVACUATION OF HEMATOMA L2-L3 (N/A) 3 Days Post-Op  Patient reports pain as 2 on 0-10 scale.  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 - requiring q4-6 st cath Positive bowel movement Positive flatus Negative chest pain or shortness of breath  Objective: Vital signs in last 24 hours: Temp:  [98.1 F (36.7 C)-99.1 F (37.3 C)] 99 F (37.2 C) (08/14 0946) Pulse Rate:  [59-90] 83 (08/14 0946) Resp:  [18-20] 20 (08/14 0946) BP: (113-136)/(52-78) 113/52 (08/14 0946) SpO2:  [96 %-99 %] 99 % (08/14 0946)  Intake/Output from previous day: 08/13 0701 - 08/14 0700 In: -  Out: 7076 [Urine:3550; Drains:20]  Labs:  Recent Labs  01/23/17 1315  WBC 11.1*  RBC 3.68*  HCT 33.5*  PLT 222    Recent Labs  01/23/17 1315  NA 134*  K 3.8  CL 98*  CO2 26  BUN 11  CREATININE 1.10  GLUCOSE 129*  CALCIUM 8.8*   No results for input(s): LABPT, INR in the last 72 hours.  Physical Exam: ABD soft Intact pulses distally Incision: dressing C/D/I Compartment soft Minimal drain output Neuro exam: LE strength slowly improving.  Incontinence issue remain  Assessment/Plan: Patient stable  Continue mobilization with physical therapy Continue care  Up with therapy  Await placement in CIR Neurogenic bladder/bowel.  Continue st cath as needed  D/C drain    Melina Schools, MD [place] [phone]

## 2017-01-26 NOTE — H&P (Signed)
Physical Medicine and Rehabilitation Admission H&P    CC: Lumbar stenosis s/p decompression with hematoma and neurogenic B/B and BLE weakness.    HPI:  Donald Calderon is a 75 y.o. 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 Rolena Infante. Post op course complicated by increase in leg pain with difficulty walking on 8/11. MRI lumbar spin reviewed, showing L2 hematoma with mass effect. Per report, fluid collection at L2 laminectomy site with severe central canal stenosis and mass effect felt to be due to hematoma. He was taken to OR that day for evacuation of hematoma and treated with decadron X 2 days.  He has had issues with BLE weakness with ataxia, urinary retention due to neurogenic bladder and incontinence of bowel. Has been requiring I/O caths due to retention. Back drain removed today.  Therapy ongoing and patient with significant decline in mobility as well as ability to carry out ADL tasks. CIR recommended    Review of Systems  Constitutional: Negative for diaphoresis and fever.  HENT: Negative for hearing loss and tinnitus.   Eyes: Negative for blurred vision and double vision.  Respiratory: Negative for cough.   Cardiovascular: Negative for chest pain, palpitations and leg swelling.  Gastrointestinal: Positive for abdominal pain (due to bladder distension) and constipation (small bm today).  Musculoskeletal: Positive for back pain, falls (due to knee instability), joint pain (bilateral knees) and myalgias.  Skin: Negative for rash.  Neurological: Positive for sensory change and focal weakness. Negative for dizziness.  Psychiatric/Behavioral: Negative for depression. The patient has insomnia. The patient is not nervous/anxious.       Past Medical History:  Diagnosis Date  . ALCOHOL ABUSE   . Anxiety     . Arthritis   . Depression   . Diabetes mellitus   . GERD (gastroesophageal reflux disease)   . Hypertension   . Pneumonia     Past Surgical History:  Procedure Laterality Date  . BACK SURGERY      disc fro picched nerve times 2  . EYE SURGERY Right    Gaucoma  . LUMBAR LAMINECTOMY/DECOMPRESSION MICRODISCECTOMY N/A 01/21/2017   Procedure: Decompression Lumbar 2-3;  Surgeon: Melina Schools, MD;  Location: [facility];  Service: Orthopedics;  Laterality: N/A;  210 mins  . LUMBAR LAMINECTOMY/DECOMPRESSION MICRODISCECTOMY N/A 01/23/2017   Procedure: EXPLORATION AND EVACUATION OF HEMATOMA L2-L3;  Surgeon: Melina Schools, MD;  Location: [facility];  Service: Orthopedics;  Laterality: N/A;  . PROSTATE SURGERY      Family History  Problem Relation Age of Onset  . Diabetes Mother   . Diabetes Father     Social History:  Married. Retired Radio broadcast assistant. He was independent with cane/walker PTA. Does not drive He reports that he quit smoking about 18 years ago. His smoking use included Cigarettes. He has a 39.00 pack-year smoking history. He quit smokeless tobacco use about 19 years ago. His smokeless tobacco use included Chew. He reports that he has cut down to drinking about 3-4 shots of liquor/daily as well as beer occasionally.  He reports that he does not use drugs.   Allergies  Allergen Reactions  . Penicillins Itching and Rash    Has patient had a PCN reaction causing immediate rash, facial/tongue/throat swelling, SOB or lightheadedness with hypotension: Yes Has patient had a PCN reaction causing severe rash  involving mucus membranes or skin necrosis: No Has patient had a PCN reaction that required hospitalization: No Has patient had a PCN reaction occurring within the last 10 years: No If all of the above answers are "NO", then may proceed with Cephalosporin use.      Medications Prior to Admission  Medication Sig Dispense Refill  . amLODipine (NORVASC) 10 MG tablet Take 1 tablet (10 mg  total) by mouth daily. 30 tablet 11  . BISACODYL 5 MG EC tablet Take 5 mg by mouth daily as needed for mild constipation.   0  . insulin aspart (NOVOLOG) 100 UNIT/ML injection Inject 4-10 Units into the skin 3 (three) times daily before meals. Per sliding scale    . LEVEMIR 100 UNIT/ML injection Inject 10-30 Units into the skin 2 (two) times daily. 10 units every morning, and 30 units every evening  2  . lisinopril (PRINIVIL,ZESTRIL) 20 MG tablet Take 1 tablet (20 mg total) by mouth daily. 30 tablet 11  . lisinopril-hydrochlorothiazide (PRINZIDE,ZESTORETIC) 20-25 MG tablet Take 1 tablet by mouth daily.  1  . naproxen sodium (ANAPROX) 220 MG tablet Take 220 mg by mouth 2 (two) times daily as needed (pain).    . pravastatin (PRAVACHOL) 10 MG tablet take 1 tablet by mouth daily in the evening  3  . dorzolamide-timolol (COSOPT) 22.3-6.8 MG/ML ophthalmic solution Place 1 drop into both eyes 2 (two) times daily.  6  . insulin glargine (LANTUS) 100 UNIT/ML injection Inject 40 Units into the skin at bedtime. 1000 mL 3  . LYRICA 100 MG capsule Take 100 mg by mouth 2 (two) times daily.  0  . potassium chloride (K-DUR) 10 MEQ tablet Take 1 tablet (10 mEq total) by mouth daily. 5 tablet 0    Home: Home Living Family/patient expects to be discharged to:: Private residence Living Arrangements: Spouse/significant other Available Help at Discharge: Family, Available 24 hours/day Type of Home: House Home Access: Stairs to enter [employer] of Steps: 6 Entrance Stairs-Rails: Right, Left Home Layout: One level Bathroom Shower/Tub: Tub/shower unit, Architectural technologist: Handicapped height Home Equipment: Environmental consultant - 2 wheels, Cane - single point, Shower seat, Grab bars - tub/shower, Grab bars - toilet   Functional History: Prior Function Level of Independence: Independent with assistive device(s) Comments: Used cane at baseline   Functional Status:  Mobility: Bed Mobility Overal bed  mobility: Needs Assistance Bed Mobility: Rolling, Sidelying to Sit Rolling: Min assist Sidelying to sit: Max assist Sit to sidelying: Mod assist General bed mobility comments: utilized log roll technique (Pt able to verbalize technique); requires MaxA for assisting LEs over EOB and bringing trunk into upright position during sidelying to sit Transfers Overall transfer level: Needs assistance Equipment used: Rolling walker (2 wheeled) Transfers: Sit to/from Stand, Stand Pivot Transfers Sit to Stand: +2 physical assistance, Mod assist Stand pivot transfers: +2 physical assistance, Mod assist General transfer comment: cues for hand placement, +2 ModA to rise and steady in standing, +2ModA for taking steps to pivot to recliner, Pt with decreased awareness/control of LE placement during mobility  Ambulation/Gait Ambulation/Gait assistance: Min guard Ambulation Distance (Feet): 50 Feet Assistive device: Rolling walker (2 wheeled) Gait Pattern/deviations: Step-through pattern, Decreased stride length, Step-to pattern, Trunk flexed General Gait Details: unable to safely progress at this time.  Gait velocity: Decreased Gait velocity interpretation: Below normal speed for age/gender    ADL: ADL Overall ADL's : Needs assistance/impaired Eating/Feeding: Set up, Sitting Grooming: Set up, Sitting Upper Body Bathing: Set up, Bed  level Lower Body Bathing: Maximal assistance, Sit to/from stand Upper Body Dressing : Minimal assistance, Sitting Upper Body Dressing Details (indicate cue type and reason): a Lower Body Dressing: Maximal assistance, Sit to/from stand Lower Body Dressing Details (indicate cue type and reason): with wife assisting Toilet Transfer: Maximal assistance, +2 for safety/equipment, +2 for physical assistance, RW Toilet Transfer Details (indicate cue type and reason): Pt stood at RW to use urinal, unable to void bladder but is incontinent with BM during standing Toileting-  Clothing Manipulation and Hygiene: Total assistance, +2 for physical assistance, +2 for safety/equipment Toileting - Clothing Manipulation Details (indicate cue type and reason): incontinent BM during session  Tub/Shower Transfer Details (indicate cue type and reason): Recommend not performing tub transfer initially due to pain and poor standing balance; pt and wife report pt will sponge bathe initially Functional mobility during ADLs: Moderate assistance, +2 for physical assistance, +2 for safety/equipment, Rolling walker General ADL Comments: Pt requires ModA +2 for completing stand pivot to recliner from EOB; requires multimodal cues for LE placement as Pt with decreased awareness of LE placement during mobility   Cognition: Cognition Overall Cognitive Status: Within Functional Limits for tasks assessed Orientation Level: Oriented X4 Cognition Arousal/Alertness: Awake/alert Behavior During Therapy: WFL for tasks assessed/performed Overall Cognitive Status: Within Functional Limits for tasks assessed    Blood pressure (!) 96/51, pulse 75, temperature 98.1 F (36.7 C), temperature source Oral, resp. rate 20, height 5\' 9"  (1.753 m), weight 94.3 kg (208 lb), SpO2 99 %. Physical Exam  Nursing note and vitals reviewed. Constitutional: He is oriented to person, place, and time. He appears well-developed and well-nourished. No distress.  HENT:  Head: Normocephalic and atraumatic.  Mouth/Throat: Oropharynx is clear and moist.  Eyes: Pupils are equal, round, and reactive to light. Conjunctivae and EOM are normal.  Neck: Normal range of motion. Neck supple.  Cardiovascular: Normal rate and regular rhythm.   Respiratory: Effort normal and breath sounds normal. No stridor.  GI: Soft. Bowel sounds are normal. He exhibits no distension. There is no tenderness.  Musculoskeletal: He exhibits no edema or tenderness.  Neurological: He is alert and oriented to person, place, and time.  Speech clear.  Able to follow commands without difficulty.   Skin: Skin is warm and dry. He is not diaphoretic.  Honey comb dressing on back with dry blood.   Psychiatric: He has a normal mood and affect. His behavior is normal. Judgment and thought content normal.    Results for orders placed or performed during the hospital encounter of 01/21/17 (from the past 48 hour(s))  Glucose, capillary     Status: Abnormal   Collection Time: 01/24/17  4:21 PM  Result Value Ref Range   Glucose-Capillary 173 (H) 65 - 99 mg/dL   Comment 1 Notify RN    Comment 2 Document in Chart   Glucose, capillary     Status: Abnormal   Collection Time: 01/24/17  9:44 PM  Result Value Ref Range   Glucose-Capillary 215 (H) 65 - 99 mg/dL   Comment 1 Notify RN    Comment 2 Document in Chart   Glucose, capillary     Status: Abnormal   Collection Time: 01/25/17  6:24 AM  Result Value Ref Range   Glucose-Capillary 133 (H) 65 - 99 mg/dL   Comment 1 Notify RN    Comment 2 Document in Chart   Glucose, capillary     Status: None   Collection Time: 01/25/17 11:27 AM  Result Value  Ref Range   Glucose-Capillary 99 65 - 99 mg/dL  Glucose, capillary     Status: Abnormal   Collection Time: 01/25/17  4:28 PM  Result Value Ref Range   Glucose-Capillary 125 (H) 65 - 99 mg/dL  Glucose, capillary     Status: Abnormal   Collection Time: 01/25/17  9:57 PM  Result Value Ref Range   Glucose-Capillary 190 (H) 65 - 99 mg/dL   Comment 1 Notify RN    Comment 2 Document in Chart   Glucose, capillary     Status: None   Collection Time: 01/26/17  6:10 AM  Result Value Ref Range   Glucose-Capillary 76 65 - 99 mg/dL   Comment 1 Notify RN    Comment 2 Document in Chart   Vancomycin, trough     Status: Abnormal   Collection Time: 01/26/17 11:19 AM  Result Value Ref Range   Vancomycin Tr 22 (HH) 15 - 20 ug/mL    Comment: CRITICAL RESULT CALLED TO, READ BACK BY AND VERIFIED WITH: H.MACINTOSH,RN 01/26/17 1211 BY BSLADE   Glucose, capillary      Status: Abnormal   Collection Time: 01/26/17 11:28 AM  Result Value Ref Range   Glucose-Capillary 194 (H) 65 - 99 mg/dL   No results found.     Medical Problem List and Plan: 1.  Functional deficits secondary to lumbar stenosis with neurogenic claudication   -admit to inpatient rehab 2.  DVT Prophylaxis/Anticoagulation: Mechanical: Sequential compression devices, below knee Bilateral lower extremities 3. Pain Management: Hydrocodone prn.  4. Mood: LCSW to follow for evaluation and support.  5. Neuropsych: This patient is capable of making decisions on not own behalf. 6. Skin/Wound Care: Monitor wound daily for healing.  7. Fluids/Electrolytes/Nutrition: Monitor I/O. Check lytes in am.  8. HTN: Monitor BP bid with orthostatic vitals. He has been hypotensive requiring IVF. Will set parameters on Norvasc, HCTZ and prinivil.  9. T2DM: Monitor BS ac/hs. Continue Levemir bid with SSI for elevated BS--po intake reasonable. Titrate upwards as indicated. 10. BPH s/p TURP: Monitor voiding and cath to keep bladder decompressed. Continue flomax.   11. Abdominal pain/Constipation:  Check KUB. Will start miralax bid with enema today.     Post Admission Physician Evaluation: 1. Functional deficits secondary  to lumbar stenosis. 2. Patient is admitted to receive collaborative, interdisciplinary care between the physiatrist, rehab nursing staff, and therapy team. 3. Patient's level of medical complexity and substantial therapy needs in context of that medical necessity cannot be provided at a lesser intensity of care such as a SNF. 4. Patient has experienced substantial functional loss from his/her baseline which was documented above under the "Functional History" and "Functional Status" headings.  Judging by the patient's diagnosis, physical exam, and functional history, the patient has potential for functional progress which will result in measurable gains while on inpatient rehab.  These gains will  be of substantial and practical use upon discharge  in facilitating mobility and self-care at the household level. 5. Physiatrist will provide 24 hour management of medical needs as well as oversight of the therapy plan/treatment and provide guidance as appropriate regarding the interaction of the two. 6. The Preadmission Screening has been reviewed and patient status is unchanged unless otherwise stated above. 7. 24 hour rehab nursing will assist with bladder management, bowel management, safety, skin/wound care, disease management, medication administration, pain management and patient education  and help integrate therapy concepts, techniques,education, etc. 8. PT will assess and treat for/with: Lower extremity strength, range  of motion, stamina, balance, functional mobility, safety, adaptive techniques and equipment, NMR, back precautions.   Goals are: supervision to min assist. 9. OT will assess and treat for/with: ADL's, functional mobility, safety, upper extremity strength, adaptive techniques and equipment, NMR, back precautions, family ed.   Goals are: supervision to min assist. Therapy may proceed with showering this patient. 10. SLP will assess and treat for/with: n/a.  Goals are: n/a. 11. Case Management and Social Worker will assess and treat for psychological issues and discharge planning. 12. Team conference will be held weekly to assess progress toward goals and to determine barriers to discharge. 13. Patient will receive at least 3 hours of therapy per day at least 5 days per week. 14. ELOS: 16-20 days       15. Prognosis:  excellent     Meredith Staggers, MD, [facility] 01/26/2017  Bary Leriche, Hershal Coria 01/26/2017

## 2017-01-26 NOTE — Discharge Summary (Signed)
Physician Discharge Summary  Patient ID: Donald Calderon MRN: [id number] DOB/AGE: [birth year] 76 y.o.  Admit date: 01/21/2017 Discharge date: 01/26/2017  Admission Diagnoses:  Lumbar DDD and hematoma evacuation  Discharge Diagnoses:  Active Problems:   Status post lumbar surgery   Status post lumbar spine operation   Epidural hematoma (HCC)   Diabetes mellitus type 2 in obese (HCC)   Benign essential HTN   ETOH abuse   Chronic pain syndrome   Hyponatremia   Leukocytosis   Acute blood loss anemia   Past Medical History:  Diagnosis Date  . ALCOHOL ABUSE   . Anxiety   . Arthritis   . Depression   . Diabetes mellitus   . GERD (gastroesophageal reflux disease)   . Hypertension   . Pneumonia     Surgeries: Procedure(s): EXPLORATION AND EVACUATION OF HEMATOMA L2-L3 on 01/21/2017 - 01/23/2017   Consultants (if any):   Discharged Condition: Improved  Hospital Course: COLSON BARCO is an 76 y.o. male who was admitted 01/21/2017 with a diagnosis of Lumbar DDD and evacuation of hematoma and went to the operating room on 01/21/2017 - 01/23/2017 and underwent the above named procedures.  Post op day two after evacuation of hematoma.  Pt reports low level of pain controlled on oral medications.  Pt continues to have Urinary retention as a result of cauda equina. Pt is mobilizing with PT.  Pt has been cleared and approved for placement in inpatient rehab.   He was given perioperative antibiotics:  Anti-infectives    Start     Dose/Rate Route Frequency Ordered Stop   01/27/17 0000  vancomycin (VANCOCIN) IVPB 750 mg/150 ml premix  Status:  Discontinued     750 mg 150 mL/hr over 60 Minutes Intravenous Every 12 hours 01/26/17 1231 01/26/17 1254   01/24/17 0000  vancomycin (VANCOCIN) IVPB 1000 mg/200 mL premix  Status:  Discontinued     1,000 mg 200 mL/hr over 60 Minutes Intravenous Every 12 hours 01/23/17 1624 01/26/17 1231   01/23/17 1259  vancomycin (VANCOCIN) 1-5 GM/200ML-% IVPB    Comments:   Block, Sarah   : cabinet override      01/23/17 1259 01/24/17 0114   01/21/17 2330  vancomycin (VANCOCIN) 1,250 mg in sodium chloride 0.9 % 250 mL IVPB     1,250 mg 166.7 mL/hr over 90 Minutes Intravenous  Once 01/21/17 2222 01/22/17 0238   01/21/17 1005  vancomycin (VANCOCIN) IVPB 1000 mg/200 mL premix     1,000 mg 200 mL/hr over 60 Minutes Intravenous 60 min pre-op 01/21/17 1005 01/21/17 1232    .  He was given sequential compression devices, early ambulation, and TED for DVT prophylaxis.  He benefited maximally from the hospital stay and there were no complications.    Recent vital signs:  Vitals:   01/26/17 0946 01/26/17 1443  BP: (!) 113/52 (!) 96/51  Pulse: 83 75  Resp: 20 20  Temp: 99 F (37.2 C) 98.1 F (36.7 C)  SpO2: 99% 99%    Recent laboratory studies:  Lab Results  Component Value Date   HGB 11.0 (L) 01/23/2017   HGB 12.8 (L) 01/15/2017   HGB 15.3 10/22/2016   Lab Results  Component Value Date   WBC 11.1 (H) 01/23/2017   PLT 222 01/23/2017   Lab Results  Component Value Date   INR 0.99 05/21/2010   Lab Results  Component Value Date   NA 134 (L) 01/23/2017   K 3.8 01/23/2017   CL 98 (L)  01/23/2017   CO2 26 01/23/2017   BUN 11 01/23/2017   CREATININE 1.10 01/23/2017   GLUCOSE 129 (H) 01/23/2017    Discharge Medications:   Allergies as of 01/26/2017      Reactions   Penicillins Itching, Rash   Has patient had a PCN reaction causing immediate rash, facial/tongue/throat swelling, SOB or lightheadedness with hypotension: Yes Has patient had a PCN reaction causing severe rash involving mucus membranes or skin necrosis: No Has patient had a PCN reaction that required hospitalization: No Has patient had a PCN reaction occurring within the last 10 years: No If all of the above answers are "NO", then may proceed with Cephalosporin use.      Medication List    TAKE these medications   amLODipine 10 MG tablet Commonly known as:  NORVASC Take 1  tablet (10 mg total) by mouth daily.   bisacodyl 5 MG EC tablet Generic drug:  bisacodyl Take 5 mg by mouth daily as needed for mild constipation.   dorzolamide-timolol 22.3-6.8 MG/ML ophthalmic solution Commonly known as:  COSOPT Place 1 drop into both eyes 2 (two) times daily.   insulin aspart 100 UNIT/ML injection Commonly known as:  novoLOG Inject 4-6 Units into the skin 3 (three) times daily before meals. Per sliding scale . If CBG>150 give 4 units, if CBG >200 give 6 units.   insulin glargine 100 UNIT/ML injection Commonly known as:  LANTUS Inject 40 Units into the skin at bedtime.   LEVEMIR 100 UNIT/ML injection Generic drug:  insulin detemir Inject 10-30 Units into the skin 2 (two) times daily. 10 units every morning, and 30 units every evening   lisinopril 20 MG tablet Commonly known as:  PRINIVIL,ZESTRIL Take 1 tablet (20 mg total) by mouth daily.   lisinopril-hydrochlorothiazide 20-25 MG tablet Commonly known as:  PRINZIDE,ZESTORETIC Take 1 tablet by mouth daily.   LYRICA 100 MG capsule Generic drug:  pregabalin Take 100 mg by mouth 2 (two) times daily.   methocarbamol 500 MG tablet Commonly known as:  ROBAXIN Take 1 tablet (500 mg total) by mouth 3 (three) times daily.   naproxen sodium 220 MG tablet Commonly known as:  ANAPROX Take 220 mg by mouth 2 (two) times daily as needed (pain).   ondansetron 4 MG disintegrating tablet Commonly known as:  ZOFRAN ODT Take 1 tablet (4 mg total) by mouth every 8 (eight) hours as needed for nausea or vomiting.   oxyCODONE-acetaminophen 10-325 MG tablet Commonly known as:  PERCOCET Take 1 tablet by mouth every 4 (four) hours as needed for pain.   potassium chloride 10 MEQ tablet Commonly known as:  K-DUR Take 1 tablet (10 mEq total) by mouth daily.   pravastatin 10 MG tablet Commonly known as:  PRAVACHOL take 1 tablet by mouth daily in the evening       Diagnostic Studies: Ct Lumbar Spine Wo  Contrast  Result Date: 01/12/2017 CLINICAL DATA:  Low back pain and left leg numbness and tingling. Previous lumbar fusion. EXAM: CT LUMBAR SPINE WITHOUT CONTRAST TECHNIQUE: Multidetector CT imaging of the lumbar spine was performed without intravenous contrast administration. Multiplanar CT image reconstructions were also generated. COMPARISON:  CT abdomen 10/02/2015.  MRI 09/28/2011. FINDINGS: Segmentation: 5 lumbar type vertebral bodies. Alignment: Curvature convex to the left with the apex at L2-3. Straightening of the normal lumbar lordosis. Vertebrae: No acute bone finding. No fracture or primary bone lesion. Paraspinal and other soft tissues: Aortic atherosclerosis. Otherwise negative. Disc levels: T12-L1:  Normal L1-2:  Mild bulging of the disc. Mild facet and ligamentous hypertrophy. Mild anterior endplate changes. Mild narrowing of both lateral recesses without definite neural compression. L2-3: Circumferential bulging of the annulus with some annular calcification. Pronounced facet and ligamentous hypertrophy. Severe multifactorial spinal stenosis at this level likely to be symptomatic. L3-4: Previous posterior decompression, diskectomy and fusion. Bilateral pedicle screws and posterior rods. Fusion appears solid without evidence of ongoing motion. Interbody fusion material was inserted from a right posterolateral direction. Mild encroachment upon the inferior foramen on the right. Mild stenosis of the lateral recesses bilaterally. L4-5: Disc degeneration with loss of disc height. Circumferential annular bulging. Facet and ligamentous hypertrophy. Stenosis of both lateral recesses. Foraminal stenosis left worse than right. L5-S1: Mild posterior bulging of the disc. Anterior disc herniation which could be painful. New since the CT scan of last year. Mild facet osteoarthritis. 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 Sacroiliac joints show  osteoarthritis. IMPRESSION: Previous discectomy and fusion at L3-4. Fusion appears solid. Bony encroachment upon the inferior foramen on the right. Mild bilateral lateral recess stenosis. Adjacent segment degenerative changes at L2-3 with severe multifactorial spinal stenosis. Adjacent segment degenerative changes at L4-5 with lateral recess and foraminal stenosis that could be symptomatic. L5-S1 newly seen anterior disc herniation could be associated with pain. 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 Sacroiliac osteoarthritis could be painful. Mild degenerative changes at L1-2. Electronically Signed   By: Nelson Chimes M.D.   On: 01/12/2017 08:47   Mr Lumbar Spine W Wo Contrast  Result Date: 01/23/2017 CLINICAL DATA:  Back pain. EXAM: MRI LUMBAR SPINE WITHOUT AND WITH CONTRAST TECHNIQUE: Multiplanar and multiecho pulse sequences of the lumbar spine were obtained without and with intravenous contrast. CONTRAST:  <See Chart> MULTIHANCE GADOBENATE DIMEGLUMINE 529 MG/ML IV SOLN COMPARISON:  None. FINDINGS: Segmentation: 5 non rib-bearing lumbar type vertebral bodies are present. Alignment: AP alignment is anatomic. Leftward curvature is centered at L2-3. Vertebrae: Vertebral body heights are preserved. Heterogeneous marrow signal likely represents fatty infiltration. Conus medullaris: Extends to the T12 level and appears normal. Paraspinal and other soft tissues: Limited imaging of the abdomen is unremarkable. Disc levels: L1-2: Mild facet hypertrophy and disc bulging is present. There is no significant stenosis. L2-3: Interval laminectomy is noted. There is heterogeneous soft tissue within the operative site, likely hemorrhage extending 2.3 cm cephalo caudad. This results in severe central canal stenosis with extrinsic compression on the thecal sac. This does not extend significantly above or below the disc space. Facet spurring contributes to mild right  foraminal narrowing. L3-4: Profusion is noted. Mild right subarticular and foraminal stenosis is present. Right laminectomy is noted. L4-5: There is chronic loss of disc height. Right laminectomy is noted. There is thickening of nerve roots. Mild subarticular and foraminal narrowing is present bilaterally. L5-S1:  Moderate facet hypertrophy is noted bilaterally. IMPRESSION: 1. Heterogeneous fluid and soft tissue collection at the L2 laminectomy site resulting in severe central canal stenosis. This likely represents postoperative hematoma with significant mass effect. There is no extension beyond the L2-3 level. 2. Mild right foraminal narrowing at L2-3. 3. Right laminectomy and fusion at L3-4 with residual or recurrent mild right subarticular and foraminal stenosis. 4. Mild subarticular and foraminal narrowing bilaterally at L4-5. These results were called by telephone at the time of interpretation on 01/23/2017 at 12:10 pm to Dr. Melina Schools , who verbally acknowledged these results. Electronically  Signed   By: San Morelle M.D.   On: 01/23/2017 12:19   Dg Lumbar Spine 1 View  Result Date: 01/21/2017 CLINICAL DATA:  L2-3 decompression. EXAM: DG C-ARM 61-120 MIN; LUMBAR SPINE - 1 VIEW COMPARISON:  CT 01/12/2017 FINDINGS: Spinal numbering as on preoperative CT. Intra procedural fluoroscopy shows retractor and probe at the level of the L2-3 interspinous space. L3-4 discectomy and posterior fusion hardware. IMPRESSION: Intraoperative fluoroscopy localizing to L2-3. Electronically Signed   By: Monte Fantasia M.D.   On: 01/21/2017 14:06   Dg C-arm 1-60 Min  Result Date: 01/21/2017 CLINICAL DATA:  L2-3 decompression. EXAM: DG C-ARM 61-120 MIN; LUMBAR SPINE - 1 VIEW COMPARISON:  CT 01/12/2017 FINDINGS: Spinal numbering as on preoperative CT. Intra procedural fluoroscopy shows retractor and probe at the level of the L2-3 interspinous space. L3-4 discectomy and posterior fusion hardware. IMPRESSION:  Intraoperative fluoroscopy localizing to L2-3. Electronically Signed   By: Monte Fantasia M.D.   On: 01/21/2017 14:06    Disposition: 01-Home or Self Care Continue rehab in [facility] Continue post op pain management Continue management of Neurogenic bladder Pt will either present to clinic in 2 weeks or myself ot Dr. Rolena Infante will remove sutures in 2 weeks.  Discharge Instructions    Incentive spirometry RT    Complete by:  As directed    Incentive spirometry RT    Complete by:  As directed       Follow-up Information    Melina Schools, MD. Schedule an appointment as soon as possible for a visit in 2 week(s).   Specialty:  Orthopedic Surgery Contact information: [address] [place] [address] [phone]            Signed: Valinda Hoar 01/26/2017, 5:05 PM   Hospital course:  Patient intially was doing well. Ambulating 40-50 ft without incontinence.  POD #2 noted to have sharp increase in back and leg pain, weakness, and incontinence of bowel.  Exam and imaging studies demonstrated epidural hematoma with significant stenosis.  As a result he was taken back to the OR that afternoon for evacuation of the hematoma.   Post-operatively the significant leg and back pain resolved.  Continued to have weakness and ataxic gait secondary to proprioception deficits.  Neurogenic bowel/bladder also continued.  Required in/out cath on multiple occassions. Because of acute neuro deficits (Cauda Equina Syndrome) secondary to hematoma I elected to set up inpatient rehab.  Patient was accepted and transferred for additional therapy. I do think he will make improvements over time.

## 2017-01-26 NOTE — Progress Notes (Addendum)
Pharmacy Antibiotic Note  Donald Calderon is a 76 y.o. male admitted on 01/21/2017 with surgical prophylaxis.  Pharmacy has been consulted for Vancomycin dosing while drain in place.   S/p exploration and evacuation of hematoma L2-L3, POD #3. Drain still in place.   Tc 99, Tmax 99.1 Vancomycin trough =22 mcg/ml on Vanc 1g IV q12h (SCr 1.1)   Plan: Decrease Vancomycin to 750 mg IV Q 12h  F/u VT at steady state  Height: 5\' 9"  (175.3 cm) Weight: 208 lb (94.3 kg) IBW/kg (Calculated) : 70.7  Temp (24hrs), Avg:98.5 F (36.9 C), Min:98.1 F (36.7 C), Max:99.1 F (37.3 C)   Recent Labs Lab 01/23/17 1315 01/26/17 1119  WBC 11.1*  --   CREATININE 1.10  --   VANCOTROUGH  --  22*    Estimated Creatinine Clearance: 65.7 mL/min (by C-G formula based on SCr of 1.1 mg/dL).    Allergies  Allergen Reactions  . Penicillins Itching and Rash    Has patient had a PCN reaction causing immediate rash, facial/tongue/throat swelling, SOB or lightheadedness with hypotension: Yes Has patient had a PCN reaction causing severe rash involving mucus membranes or skin necrosis: No Has patient had a PCN reaction that required hospitalization: No Has patient had a PCN reaction occurring within the last 10 years: No If all of the above answers are "NO", then may proceed with Cephalosporin use.      Thank you for allowing pharmacy to be a part of this patient's care. Nicole Cella, RPh Clinical Pharmacist Pager: [phone]-4P [phone] [phone] [place] [phone] 01/26/2017 12:22 PM

## 2017-01-26 NOTE — PMR Pre-admission (Signed)
PMR Admission Coordinator Pre-Admission Assessment  Patient: Donald Calderon is an 76 y.o., male MRN: [id number] DOB: 05-12-1941 Height: 5\' 9"  (175.3 cm) Weight: 94.3 kg (208 lb)              Insurance Information HMO:     PPO: X     PCP:      IPA:      80/20:      OTHER:  PRIMARY: [employer] Medicare       Policy#: [id number]      Subscriber: Self CM Name: Donald Calderon       Phone#: [phone]     Fax#: [phone] Pre-Cert#: [id number]  For 8/14/1/-02/01/17    Employer: Retired  Benefits:  Phone #: verified online     Name: Donald Calderon. Date: 06/15/16     Deduct: $0      Out of Pocket Max: $4500      Life Max: N/A CIR: $250 a day, days 1-6, up to $1500 per admission      SNF: $0 a day, days 1-20; $164 a day, days 21-100 Outpatient: PT/OT     Co-Pay: $40 per visit  Home Health: 100%      Co-Pay: none DME: 80%     Co-Pay: 20% Providers: In-network   Medicaid Application Date:       Case Manager:  Disability Application Date:       Case Worker:   Emergency Facilities manager Information    Name Relation Home Work Mobile   Donald Calderon [place] Spouse [phone]     Donald Calderon Daughter   [phone]     Current Medical History  Patient Admitting Diagnosis: Lumbar stenosis with hematoma s/p decompression History of Present Illness: Donald Calderon is a 76 y.o. 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 Rolena Infante. Post op course complicated by increase in leg pain with difficulty walking on 8/11. MRI lumbar spin reviewed, showing L2 hematoma with mass effect. Per report, fluid collection at L2 laminectomy site with severe central canal stenosis and mass effect felt to be due to hematoma. He was taken to OR that day for evacuation of hematoma. He has had issues with BLE weakness with ataxia, urinary retention due  to neurogenic bladder and incontinence of bowel. Therapy ongoing and patient with significant decline in mobility as well as ability to carry out ADL tasks. CIR recommended for follow up therapy and patient admitted 01/26/17.       Past Medical History  Past Medical History:  Diagnosis Date  . ALCOHOL ABUSE   . Anxiety   . Arthritis   . Depression   . Diabetes mellitus   . GERD (gastroesophageal reflux disease)   . Hypertension   . Pneumonia     Family History  family history includes Diabetes in his father and mother.  Prior Rehab/Hospitalizations:  Has the patient had major surgery during 100 days prior to admission? No  Current Medications   Current Facility-Administered Medications:  .  0.9 %  sodium chloride infusion, 250 mL, Intravenous, Continuous, Melina Schools, MD .  acetaminophen (TYLENOL) tablet 650 mg, 650 mg, Oral, Q4H PRN **OR** acetaminophen (TYLENOL) suppository 650 mg, 650 mg, Rectal, Q4H PRN, Melina Schools, MD .  alum & mag hydroxide-simeth (MAALOX/MYLANTA) 200-200-20 MG/5ML suspension 30 mL, 30 mL, Oral, Q6H PRN, Melina Schools, MD,  30 mL at 01/26/17 1029 .  amLODipine (NORVASC) tablet 10 mg, 10 mg, Oral, Daily, Melina Schools, MD, 10 mg at 01/26/17 1021 .  dorzolamide-timolol (COSOPT) 22.3-6.8 MG/ML ophthalmic solution 1 drop, 1 drop, Both Eyes, BID, Melina Schools, MD, 1 drop at 01/26/17 1045 .  hydrochlorothiazide (HYDRODIURIL) tablet 25 mg, 25 mg, Oral, Daily, Melina Schools, MD, 25 mg at 01/26/17 1021 .  HYDROcodone-acetaminophen (NORCO) 10-325 MG per tablet 1 tablet, 1 tablet, Oral, Q6H PRN, Melina Schools, MD, 1 tablet at 01/26/17 0818 .  insulin aspart (novoLOG) injection 0-15 Units, 0-15 Units, Subcutaneous, TID WC, Melina Schools, MD, 3 Units at 01/26/17 1212 .  insulin aspart (novoLOG) injection 0-5 Units, 0-5 Units, Subcutaneous, QHS, Melina Schools, MD, 2 Units at 01/24/17 2230 .  insulin aspart (novoLOG) injection 4-10 Units, 4-10 Units,  Subcutaneous, TID AC, Melina Schools, MD, Stopped at 01/26/17 1200 .  insulin detemir (LEVEMIR) injection 10 Units, 10 Units, Subcutaneous, Daily, Melina Schools, MD, 10 Units at 01/26/17 1021 .  insulin detemir (LEVEMIR) injection 30 Units, 30 Units, Subcutaneous, QHS, Melina Schools, MD, 30 Units at 01/25/17 2219 .  lactated ringers infusion, , Intravenous, Continuous, Melina Schools, MD, Last Rate: 85 mL/hr at 01/23/17 1646 .  lisinopril (PRINIVIL,ZESTRIL) tablet 20 mg, 20 mg, Oral, Daily, Melina Schools, MD, 20 mg at 01/26/17 1021 .  menthol-cetylpyridinium (CEPACOL) lozenge 3 mg, 1 lozenge, Oral, PRN **OR** phenol (CHLORASEPTIC) mouth spray 1 spray, 1 spray, Mouth/Throat, PRN, Melina Schools, MD .  methocarbamol (ROBAXIN) tablet 500 mg, 500 mg, Oral, Q6H PRN, 500 mg at 01/26/17 0818 **OR** [DISCONTINUED] methocarbamol (ROBAXIN) 500 mg in dextrose 5 % 50 mL IVPB, 500 mg, Intravenous, Q6H PRN, Melina Schools, MD .  ondansetron (ZOFRAN) tablet 4 mg, 4 mg, Oral, Q6H PRN **OR** ondansetron (ZOFRAN) injection 4 mg, 4 mg, Intravenous, Q6H PRN, Melina Schools, MD .  pravastatin (PRAVACHOL) tablet 10 mg, 10 mg, Oral, QPM, Melina Schools, MD, 10 mg at 01/25/17 1630 .  sodium chloride flush (NS) 0.9 % injection 3 mL, 3 mL, Intravenous, Q12H, Melina Schools, MD, 3 mL at 01/26/17 1213 .  sodium chloride flush (NS) 0.9 % injection 3 mL, 3 mL, Intravenous, PRN, Melina Schools, MD .  tamsulosin [facility]) capsule 0.4 mg, 0.4 mg, Oral, QPC breakfast, Melina Schools, MD, 0.4 mg at 01/26/17 6295  Patients Current Diet: Diet Carb Modified Fluid consistency: Thin; Room service appropriate? Yes  Precautions / Restrictions Precautions Precautions: Fall, Back Precaution Booklet Issued: Yes (comment) Precaution Comments: Pt is able to report 3/3 back precautions  Spinal Brace: Lumbar corset, Applied in sitting position Restrictions Weight Bearing Restrictions: No   Has the patient had 2 or more falls or a fall  with injury in the past year?Yes, 6 with bruises   Prior Activity Level Community (5-7x/wk): Prior to admission patient reports that he has had a decline in function for about a year.  However, he was still fully independent-Mod I with all basic self-care tasks.  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    Home Assistive Devices / Equipment Home Assistive Devices/Equipment: Cane (specify quad or straight), Walker (specify type), Grab bars around toilet, Grab bars in shower, Shower chair with back, CBG Meter, Dentures (specify type) (full dentures) Home Equipment: Walker - 2 wheels, Cane - single point, Shower seat, Grab bars - tub/shower, Grab bars - toilet  Prior Device Use: Indicate devices/aids used by the patient prior to current illness, exacerbation or injury? Walker and [employer]  Prior Functional Level  Prior Function Level of Independence: Independent with assistive device(s) Comments: Used cane at baseline   Self Care: Did the patient need help bathing, dressing, using the toilet or eating? Independent  Indoor Mobility: Did the patient need assistance with walking from room to room (with or without device)? Independent  Stairs: Did the patient need assistance with internal or external stairs (with or without device)? Independent  Functional Cognition: Did the patient need help planning regular tasks such as shopping or remembering to take medications? Independent  Current Functional Level Cognition  Overall Cognitive Status: Within Functional Limits for tasks assessed Orientation Level: Oriented X4    Extremity Assessment (includes Sensation/Coordination)  Upper Extremity Assessment: Generalized weakness  Lower Extremity Assessment: Defer to PT evaluation RLE Deficits / Details: Numbness reported into feet. Ataxic RLE. reports decrease in sensation. difficulty with proprioception. Unable to safely step with RLE LLE Deficits / Details: Numbness reported into feet;  reports improved since surgery    ADLs  Overall ADL's : Needs assistance/impaired Eating/Feeding: Set up, Sitting Grooming: Set up, Sitting Upper Body Bathing: Set up, Bed level Lower Body Bathing: Maximal assistance, Sit to/from stand Upper Body Dressing : Minimal assistance, Sitting Upper Body Dressing Details (indicate cue type and reason): a Lower Body Dressing: Maximal assistance, Sit to/from stand Lower Body Dressing Details (indicate cue type and reason): with wife assisting Toilet Transfer: Maximal assistance, +2 for safety/equipment, +2 for physical assistance, RW Toilet Transfer Details (indicate cue type and reason): Pt stood at RW to use urinal, unable to void bladder but is incontinent with BM during standing Toileting- Clothing Manipulation and Hygiene: Total assistance, +2 for physical assistance, +2 for safety/equipment Toileting - Clothing Manipulation Details (indicate cue type and reason): incontinent BM during session  Tub/Shower Transfer Details (indicate cue type and reason): Recommend not performing tub transfer initially due to pain and poor standing balance; pt and wife report pt will sponge bathe initially Functional mobility during ADLs: Moderate assistance, +2 for physical assistance, +2 for safety/equipment, Rolling walker General ADL Comments: Pt requires ModA +2 for completing stand pivot to recliner from EOB; requires multimodal cues for LE placement as Pt with decreased awareness of LE placement during mobility     Mobility  Overal bed mobility: Needs Assistance Bed Mobility: Rolling, Sidelying to Sit Rolling: Min assist Sidelying to sit: Max assist Sit to sidelying: Mod assist General bed mobility comments: utilized log roll technique (Pt able to verbalize technique); requires MaxA for assisting LEs over EOB and bringing trunk into upright position during sidelying to sit    Transfers  Overall transfer level: Needs assistance Equipment used: Rolling  walker (2 wheeled) Transfers: Sit to/from Stand, Stand Pivot Transfers Sit to Stand: +2 physical assistance, Mod assist Stand pivot transfers: +2 physical assistance, Mod assist General transfer comment: cues for hand placement, +2 ModA to rise and steady in standing, +2ModA for taking steps to pivot to recliner, Pt with decreased awareness/control of LE placement during mobility     Ambulation / Gait / Stairs / Wheelchair Mobility  Ambulation/Gait Ambulation/Gait assistance: Physicist, medical (Feet): 50 Feet Assistive device: Rolling walker (2 wheeled) Gait Pattern/deviations: Step-through pattern, Decreased stride length, Step-to pattern, Trunk flexed General Gait Details: unable to safely progress at this time.  Gait velocity: Decreased Gait velocity interpretation: Below normal speed for age/gender    Posture / Balance Balance Overall balance assessment: Needs assistance Sitting-balance support: Feet supported, Bilateral upper extremity supported Sitting balance-Leahy Scale: Fair Standing balance support: Bilateral upper extremity  supported Standing balance-Leahy Scale: Poor Standing balance comment: two person mod assist with RW.      Special needs/care consideration BiPAP/CPAP: No CPM: No Continuous Drip IV: No Dialysis: No Life Vest: No Oxygen: No Special Bed: No Trach Size: No Wound Vac (area): No       Skin: WDL                              Location: surgical incision, back  Bowel mgmt: Patient reports lots of smears, last documented BM 01/23/17  Bladder mgmt: Incontinent  Diabetic mgmt: Yes, diet oral medication, and sliding scale insulin prior to admission      Previous Home Environment Living Arrangements: Spouse/significant other Available Help at Discharge: Family, Available 24 hours/day Type of Home: House Home Layout: One level Home Access: Stairs to enter Entrance Stairs-Rails: Right, Left Entrance Stairs-Number of Steps: 6 Bathroom  Shower/Tub: Tub/shower unit, Architectural technologist: Handicapped height Home Care Services: No  Discharge Living Setting Plans for Discharge Living Setting: Patient's home, Lives with (comment) (spouse) Type of Home at Discharge: House Discharge Home Layout: One level Discharge Home Access: Stairs to enter Entrance Stairs-Rails: None Entrance Stairs-Number of Steps: 2 Discharge Bathroom Shower/Tub: Tub/shower unit Discharge Bathroom Toilet: Handicapped height Discharge Bathroom Accessibility: No (patient reports that he has grab bars ) Does the patient have any problems obtaining your medications?: No  Social/Family/Support Systems Patient Roles: Spouse, Parent Contact Information: Spouse: [facility] May Batz [phone]; Daughter: Sissy Hoff [phone] Anticipated Caregiver: Family  Anticipated Caregiver's Contact Information: See above  Ability/Limitations of Caregiver: Spouse can provide Sueprvision-light Min A Caregiver Availability: 24/7 Discharge Plan Discussed with Primary Caregiver: Yes Is Caregiver In Agreement with Plan?: Yes Does Caregiver/Family have Issues with Lodging/Transportation while Pt is in Rehab?: No   Goals/Additional Needs Patient/Family Goal for Rehab: PT/OT Supervision-Min A Expected length of stay: 16-20 days  Cultural Considerations: None Dietary Needs: Carb. Mod. diet restrictions  Equipment Needs: TBD Special Service Needs: None Additional Information: None Pt/Family Agrees to Admission and willing to participate: Yes Program Orientation Provided & Reviewed with Pt/Caregiver Including Roles  & Responsibilities: Yes Additional Information Needs: None Information Needs to be Provided By: N/A   Decrease burden of Care through IP rehab admission: No  Possible need for SNF placement upon discharge: No  Patient Condition: This patient's condition remains as documented in the consult dated 01/25/17 at 11:52 AM, in which the Rehabilitation Physician  determined and documented that the patient's condition is appropriate for intensive rehabilitative care in an inpatient rehabilitation facility. Will admit to inpatient rehab today.  Preadmission Screen Completed By:  Gunnar Fusi, 01/26/2017 4:14 PM ______________________________________________________________________   Discussed status with Dr. Naaman Plummer on 01/26/17 at 1618 and received telephone approval for admission today.  Admission Coordinator:  Gunnar Fusi, time 1618/Date 01/26/17

## 2017-01-26 NOTE — Progress Notes (Signed)
Inpatient Rehabilitation  Met with patient to update; I have received authorization to admit patient to IP Rehab today and have a bed to offer.  Patient in agreement with plan.  Will proceed with admission today.  Please call with questions.   Carmelia Roller., CCC/SLP Admission Coordinator  [facility]  Cell [phone]

## 2017-01-26 NOTE — Progress Notes (Signed)
Donald Calderon Rehab Admission Coordinator Signed Physical Medicine and Rehabilitation  PMR Pre-admission Date of Service: 01/26/2017 4:10 PM  Related encounter: Admission (Discharged) from 01/21/2017 in [facility]       [] Hide copied text PMR Admission Coordinator Pre-Admission Assessment  Patient: Donald Calderon is an 76 y.o., male MRN: [id number] DOB: 01-10-1941 Height: 5\' 9"  (175.3 cm) Weight: 94.3 kg (208 lb)                                                                                                                                                  Insurance Information HMO:     PPO: X     PCP:      IPA:      80/20:      OTHER:  PRIMARY: AETNA Medicare       Policy#: [id number]      Subscriber: Self CM Name: Donald Calderon       Phone#: [phone]     Fax#: [phone] Pre-Cert#: [id number]  For 8/14/1/-02/01/17    Employer: Retired  Benefits:  Phone #: verified online     Name: Donald Calderon. Date: 06/15/16     Deduct: $0      Out of Pocket Max: $4500      Life Max: N/A CIR: $250 a day, days 1-6, up to $1500 per admission      SNF: $0 a day, days 1-20; $164 a day, days 21-100 Outpatient: PT/OT     Co-Pay: $40 per visit  Home Health: 100%      Co-Pay: none DME: 80%     Co-Pay: 20% Providers: In-network   Medicaid Application Date:       Case Manager:  Disability Application Date:       Case Worker:   Emergency Tax adviser Information    Name Relation Home Work Mobile   Calderon,Donald [place] Spouse [phone]     Calderon,Donald Daughter   [phone]     Current Medical History  Patient Admitting Diagnosis: Lumbar stenosis with hematoma s/p decompression History of Present Illness: Donald Calderon a 76 y.o.malewith history of T2DM, HTN, alcohol abuse, 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 Rolena Infante. Post op course complicated by increase in leg pain with difficulty walking on 8/11. MRI lumbar spin reviewed, showing L2 hematoma with mass effect. Per report, fluid collection at L2 laminectomy site with severe central canal stenosis and mass effect felt to be due to hematoma. He was taken to OR that day for evacuation of hematoma. He has had issues with BLE weakness with ataxia, urinary retention due to neurogenic bladder and incontinence of bowel. Therapy ongoing and  patient with significant decline in mobility as well as ability to carry out ADL tasks. CIR recommended for follow up therapy and patient admitted 01/26/17.   Past Medical History      Past Medical History:  Diagnosis Date  . ALCOHOL ABUSE   . Anxiety   . Arthritis   . Depression   . Diabetes mellitus   . GERD (gastroesophageal reflux disease)   . Hypertension   . Pneumonia     Family History  family history includes Diabetes in his father and mother.  Prior Rehab/Hospitalizations:  Has the patient had major surgery during 100 days prior to admission? No  Current Medications   Current Facility-Administered Medications:  .  0.9 %  sodium chloride infusion, 250 mL, Intravenous, Continuous, Donald Schools, MD .  acetaminophen (TYLENOL) tablet 650 mg, 650 mg, Oral, Q4H PRN **OR** acetaminophen (TYLENOL) suppository 650 mg, 650 mg, Rectal, Q4H PRN, Donald Schools, MD .  alum & mag hydroxide-simeth (MAALOX/MYLANTA) 200-200-20 MG/5ML suspension 30 mL, 30 mL, Oral, Q6H PRN, Donald Schools, MD, 30 mL at 01/26/17 1029 .  amLODipine (NORVASC) tablet 10 mg, 10 mg, Oral, Daily, Donald Schools, MD, 10 mg at 01/26/17 1021 .  dorzolamide-timolol (COSOPT) 22.3-6.8 MG/ML ophthalmic solution 1 drop, 1 drop, Both Eyes, BID, Donald Schools, MD, 1 drop at 01/26/17 1045 .  hydrochlorothiazide (HYDRODIURIL) tablet 25 mg, 25 mg, Oral, Daily, Donald Schools, MD, 25 mg at 01/26/17 1021 .   HYDROcodone-acetaminophen (NORCO) 10-325 MG per tablet 1 tablet, 1 tablet, Oral, Q6H PRN, Donald Schools, MD, 1 tablet at 01/26/17 0818 .  insulin aspart (novoLOG) injection 0-15 Units, 0-15 Units, Subcutaneous, TID WC, Donald Schools, MD, 3 Units at 01/26/17 1212 .  insulin aspart (novoLOG) injection 0-5 Units, 0-5 Units, Subcutaneous, QHS, Donald Schools, MD, 2 Units at 01/24/17 2230 .  insulin aspart (novoLOG) injection 4-10 Units, 4-10 Units, Subcutaneous, TID AC, Donald Schools, MD, Stopped at 01/26/17 1200 .  insulin detemir (LEVEMIR) injection 10 Units, 10 Units, Subcutaneous, Daily, Donald Schools, MD, 10 Units at 01/26/17 1021 .  insulin detemir (LEVEMIR) injection 30 Units, 30 Units, Subcutaneous, QHS, Donald Schools, MD, 30 Units at 01/25/17 2219 .  lactated ringers infusion, , Intravenous, Continuous, Donald Schools, MD, Last Rate: 85 mL/hr at 01/23/17 1646 .  lisinopril (PRINIVIL,ZESTRIL) tablet 20 mg, 20 mg, Oral, Daily, Donald Schools, MD, 20 mg at 01/26/17 1021 .  menthol-cetylpyridinium (CEPACOL) lozenge 3 mg, 1 lozenge, Oral, PRN **OR** phenol (CHLORASEPTIC) mouth spray 1 spray, 1 spray, Mouth/Throat, PRN, Donald Schools, MD .  methocarbamol (ROBAXIN) tablet 500 mg, 500 mg, Oral, Q6H PRN, 500 mg at 01/26/17 0818 **OR** [DISCONTINUED] methocarbamol (ROBAXIN) 500 mg in dextrose 5 % 50 mL IVPB, 500 mg, Intravenous, Q6H PRN, Donald Schools, MD .  ondansetron (ZOFRAN) tablet 4 mg, 4 mg, Oral, Q6H PRN **OR** ondansetron (ZOFRAN) injection 4 mg, 4 mg, Intravenous, Q6H PRN, Donald Schools, MD .  pravastatin (PRAVACHOL) tablet 10 mg, 10 mg, Oral, QPM, Donald Schools, MD, 10 mg at 01/25/17 1630 .  sodium chloride flush (NS) 0.9 % injection 3 mL, 3 mL, Intravenous, Q12H, Donald Schools, MD, 3 mL at 01/26/17 1213 .  sodium chloride flush (NS) 0.9 % injection 3 mL, 3 mL, Intravenous, PRN, Donald Schools, MD .  tamsulosin [facility] - Eat) capsule 0.4 mg, 0.4 mg, Oral, QPC breakfast, Donald Schools, MD, 0.4  mg at 01/26/17 7782  Patients Current Diet: Diet Carb Modified Fluid consistency: Thin; Room service appropriate? Yes  Precautions / Restrictions Precautions Precautions: Fall, Back  Precaution Booklet Issued: Yes (comment) Precaution Comments: Pt is able to report 3/3 back precautions  Spinal Brace: Lumbar corset, Applied in sitting position Restrictions Weight Bearing Restrictions: No   Has the patient had 2 or more falls or a fall with injury in the past year?Yes, 6 with bruises   Prior Activity Level Community (5-7x/wk): Prior to admission patient reports that he has had a decline in function for about a year.  However, he was still fully independent-Mod I with all basic self-care tasks.  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    Home Assistive Devices / Equipment Home Assistive Devices/Equipment: Cane (specify quad or straight), Walker (specify type), Grab bars around toilet, Grab bars in shower, Shower chair with back, CBG Meter, Dentures (specify type) (full dentures) Home Equipment: Walker - 2 wheels, Cane - single point, Shower seat, Grab bars - tub/shower, Grab bars - toilet  Prior Device Use: Indicate devices/aids used by the patient prior to current illness, exacerbation or injury? Walker and [employer]  Prior Functional Level Prior Function Level of Independence: Independent with assistive device(s) Comments: Used cane at baseline   Self Care: Did the patient need help bathing, dressing, using the toilet or eating? Independent  Indoor Mobility: Did the patient need assistance with walking from room to room (with or without device)? Independent  Stairs: Did the patient need assistance with internal or external stairs (with or without device)? Independent  Functional Cognition: Did the patient need help planning regular tasks such as shopping or remembering to take medications? Independent  Current Functional Level Cognition  Overall Cognitive  Status: Within Functional Limits for tasks assessed Orientation Level: Oriented X4    Extremity Assessment (includes Sensation/Coordination)  Upper Extremity Assessment: Generalized weakness  Lower Extremity Assessment: Defer to PT evaluation RLE Deficits / Details: Numbness reported into feet. Ataxic RLE. reports decrease in sensation. difficulty with proprioception. Unable to safely step with RLE LLE Deficits / Details: Numbness reported into feet; reports improved since surgery    ADLs  Overall ADL's : Needs assistance/impaired Eating/Feeding: Set up, Sitting Grooming: Set up, Sitting Upper Body Bathing: Set up, Bed level Lower Body Bathing: Maximal assistance, Sit to/from stand Upper Body Dressing : Minimal assistance, Sitting Upper Body Dressing Details (indicate cue type and reason): a Lower Body Dressing: Maximal assistance, Sit to/from stand Lower Body Dressing Details (indicate cue type and reason): with wife assisting Toilet Transfer: Maximal assistance, +2 for safety/equipment, +2 for physical assistance, RW Toilet Transfer Details (indicate cue type and reason): Pt stood at RW to use urinal, unable to void bladder but is incontinent with BM during standing Toileting- Clothing Manipulation and Hygiene: Total assistance, +2 for physical assistance, +2 for safety/equipment Toileting - Clothing Manipulation Details (indicate cue type and reason): incontinent BM during session  Tub/Shower Transfer Details (indicate cue type and reason): Recommend not performing tub transfer initially due to pain and poor standing balance; pt and wife report pt will sponge bathe initially Functional mobility during ADLs: Moderate assistance, +2 for physical assistance, +2 for safety/equipment, Rolling walker General ADL Comments: Pt requires ModA +2 for completing stand pivot to recliner from EOB; requires multimodal cues for LE placement as Pt with decreased awareness of LE placement during  mobility     Mobility  Overal bed mobility: Needs Assistance Bed Mobility: Rolling, Sidelying to Sit Rolling: Min assist Sidelying to sit: Max assist Sit to sidelying: Mod assist General bed mobility comments: utilized log roll technique (Pt able to verbalize technique);  requires MaxA for assisting LEs over EOB and bringing trunk into upright position during sidelying to sit    Transfers  Overall transfer level: Needs assistance Equipment used: Rolling walker (2 wheeled) Transfers: Sit to/from Stand, Stand Pivot Transfers Sit to Stand: +2 physical assistance, Mod assist Stand pivot transfers: +2 physical assistance, Mod assist General transfer comment: cues for hand placement, +2 ModA to rise and steady in standing, +2ModA for taking steps to pivot to recliner, Pt with decreased awareness/control of LE placement during mobility     Ambulation / Gait / Stairs / Wheelchair Mobility  Ambulation/Gait Ambulation/Gait assistance: Physicist, medical (Feet): 50 Feet Assistive device: Rolling walker (2 wheeled) Gait Pattern/deviations: Step-through pattern, Decreased stride length, Step-to pattern, Trunk flexed General Gait Details: unable to safely progress at this time.  Gait velocity: Decreased Gait velocity interpretation: Below normal speed for age/gender    Posture / Balance Balance Overall balance assessment: Needs assistance Sitting-balance support: Feet supported, Bilateral upper extremity supported Sitting balance-Leahy Scale: Fair Standing balance support: Bilateral upper extremity supported Standing balance-Leahy Scale: Poor Standing balance comment: two person mod assist with RW.      Special needs/care consideration BiPAP/CPAP: No CPM: No Continuous Drip IV: No Dialysis: No Life Vest: No Oxygen: No Special Bed: No Trach Size: No Wound Vac (area): No       Skin: WDL                              Location: surgical incision, back  Bowel mgmt: Patient  reports lots of smears, last documented BM 01/23/17  Bladder mgmt: Incontinent  Diabetic mgmt: Yes, diet oral medication, and sliding scale insulin prior to admission      Previous Home Environment Living Arrangements: Spouse/significant other Available Help at Discharge: Family, Available 24 hours/day Type of Home: House Home Layout: One level Home Access: Stairs to enter Entrance Stairs-Rails: Right, Left Entrance Stairs-Number of Steps: 6 Bathroom Shower/Tub: Tub/shower unit, Architectural technologist: Handicapped height Home Care Services: No  Discharge Living Setting Plans for Discharge Living Setting: Patient's home, Lives with (comment) (spouse) Type of Home at Discharge: House Discharge Home Layout: One level Discharge Home Access: Stairs to enter Entrance Stairs-Rails: None Entrance Stairs-Number of Steps: 2 Discharge Bathroom Shower/Tub: Tub/shower unit Discharge Bathroom Toilet: Handicapped height Discharge Bathroom Accessibility: No (patient reports that he has grab bars ) Does the patient have any problems obtaining your medications?: No  Social/Family/Support Systems Patient Roles: Spouse, Parent Contact Information: Spouse: [facility] May Kirkes [phone]; Daughter: Sissy Hoff [phone] Anticipated Caregiver: Family  Anticipated Caregiver's Contact Information: See above  Ability/Limitations of Caregiver: Spouse can provide Sueprvision-light Min A Caregiver Availability: 24/7 Discharge Plan Discussed with Primary Caregiver: Yes Is Caregiver In Agreement with Plan?: Yes Does Caregiver/Family have Issues with Lodging/Transportation while Pt is in Rehab?: No   Goals/Additional Needs Patient/Family Goal for Rehab: PT/OT Supervision-Min A Expected length of stay: 16-20 days  Cultural Considerations: None Dietary Needs: Carb. Mod. diet restrictions  Equipment Needs: TBD Special Service Needs: None Additional Information: None Pt/Family Agrees to Admission  and willing to participate: Yes Program Orientation Provided & Reviewed with Pt/Caregiver Including Roles  & Responsibilities: Yes Additional Information Needs: None Information Needs to be Provided By: N/A   Decrease burden of Care through IP rehab admission: No  Possible need for SNF placement upon discharge: No  Patient Condition: This patient's condition remains as documented in the consult dated 01/25/17 at  11:52 AM, in which the Rehabilitation Physician determined and documented that the patient's condition is appropriate for intensive rehabilitative care in an inpatient rehabilitation facility. Will admit to inpatient rehab today.  Preadmission Screen Completed By:  Donald Calderon, 01/26/2017 4:14 PM ______________________________________________________________________   Discussed status with Dr. Naaman Plummer on 01/26/17 at 1618 and received telephone approval for admission today.  Admission Coordinator:  Donald Calderon, time 1618/Date 01/26/17       Cosigned by: Meredith Staggers, MD at 01/26/2017 4:47 PM  Revision History

## 2017-01-26 NOTE — Care Management Note (Signed)
Case Management Note  Patient Details  Name: Donald Calderon MRN: [id number] Date of Birth: [birth year]  Subjective/Objective:                    Action/Plan: Pt discharging to CIR. No further needs per CM.  Expected Discharge Date:  01/26/17               Expected Discharge Plan:  Home/Self Care  In-House Referral:  NA  Discharge planning Services  CM Consult  Post Acute Care Choice:  Home Health Choice offered to:  Patient, Spouse  DME Arranged:  N/A DME Agency:  NA  HH Arranged:  Patient Refused [place] Agency:  NA  Status of Service:  Completed, signed off  If discussed at [place] of Stay Meetings, dates discussed:    Additional Comments:  Pollie Friar, RN 01/26/2017, 4:38 PM

## 2017-01-27 ENCOUNTER — Inpatient Hospital Stay (HOSPITAL_COMMUNITY): Payer: Medicare HMO | Admitting: Occupational Therapy

## 2017-01-27 ENCOUNTER — Inpatient Hospital Stay (HOSPITAL_COMMUNITY): Payer: Medicare HMO | Admitting: Physical Therapy

## 2017-01-27 DIAGNOSIS — E871 Hypo-osmolality and hyponatremia: Secondary | ICD-10-CM

## 2017-01-27 DIAGNOSIS — K5900 Constipation, unspecified: Secondary | ICD-10-CM

## 2017-01-27 DIAGNOSIS — D62 Acute posthemorrhagic anemia: Secondary | ICD-10-CM

## 2017-01-27 DIAGNOSIS — M48062 Spinal stenosis, lumbar region with neurogenic claudication: Secondary | ICD-10-CM

## 2017-01-27 LAB — URINALYSIS, ROUTINE W REFLEX MICROSCOPIC
Bilirubin Urine: NEGATIVE
GLUCOSE, UA: NEGATIVE mg/dL
HGB URINE DIPSTICK: NEGATIVE
Ketones, ur: NEGATIVE mg/dL
LEUKOCYTES UA: NEGATIVE
Nitrite: NEGATIVE
PH: 8 (ref 5.0–8.0)
Protein, ur: NEGATIVE mg/dL
SPECIFIC GRAVITY, URINE: 1.005 (ref 1.005–1.030)

## 2017-01-27 LAB — COMPREHENSIVE METABOLIC PANEL
ALK PHOS: 37 U/L — AB (ref 38–126)
ALT: 14 U/L — AB (ref 17–63)
AST: 16 U/L (ref 15–41)
Albumin: 2.6 g/dL — ABNORMAL LOW (ref 3.5–5.0)
Anion gap: 7 (ref 5–15)
BUN: 14 mg/dL (ref 6–20)
CALCIUM: 8.7 mg/dL — AB (ref 8.9–10.3)
CHLORIDE: 97 mmol/L — AB (ref 101–111)
CO2: 29 mmol/L (ref 22–32)
CREATININE: 1.1 mg/dL (ref 0.61–1.24)
Glucose, Bld: 131 mg/dL — ABNORMAL HIGH (ref 65–99)
Potassium: 3.5 mmol/L (ref 3.5–5.1)
SODIUM: 133 mmol/L — AB (ref 135–145)
Total Bilirubin: 0.7 mg/dL (ref 0.3–1.2)
Total Protein: 5.4 g/dL — ABNORMAL LOW (ref 6.5–8.1)

## 2017-01-27 LAB — CBC WITH DIFFERENTIAL/PLATELET
BASOS ABS: 0 10*3/uL (ref 0.0–0.1)
BASOS PCT: 1 %
EOS ABS: 0.5 10*3/uL (ref 0.0–0.7)
Eosinophils Relative: 5 %
HEMATOCRIT: 30.4 % — AB (ref 39.0–52.0)
Hemoglobin: 10.2 g/dL — ABNORMAL LOW (ref 13.0–17.0)
Lymphocytes Relative: 16 %
Lymphs Abs: 1.4 10*3/uL (ref 0.7–4.0)
MCH: 29.3 pg (ref 26.0–34.0)
MCHC: 33.6 g/dL (ref 30.0–36.0)
MCV: 87.4 fL (ref 78.0–100.0)
MONO ABS: 1.1 10*3/uL — AB (ref 0.1–1.0)
Monocytes Relative: 13 %
NEUTROS ABS: 5.6 10*3/uL (ref 1.7–7.7)
Neutrophils Relative %: 65 %
PLATELETS: 268 10*3/uL (ref 150–400)
RBC: 3.48 MIL/uL — ABNORMAL LOW (ref 4.22–5.81)
RDW: 12.5 % (ref 11.5–15.5)
WBC: 8.6 10*3/uL (ref 4.0–10.5)

## 2017-01-27 LAB — GLUCOSE, CAPILLARY
GLUCOSE-CAPILLARY: 131 mg/dL — AB (ref 65–99)
GLUCOSE-CAPILLARY: 151 mg/dL — AB (ref 65–99)
GLUCOSE-CAPILLARY: 216 mg/dL — AB (ref 65–99)
Glucose-Capillary: 140 mg/dL — ABNORMAL HIGH (ref 65–99)

## 2017-01-27 NOTE — Progress Notes (Signed)
Patient information reviewed and entered into eRehab System by Becky Demeshia Sherburne, covering PPS coordinator. Information including medical coding and functional independence measure will be reviewed and updated through discharge.  Per nursing, patient was given "Data Collection Information Summary for Patients in Inpatient Rehabilitation Facilities with attached Privacy Act Statement Health Care Records" upon admission.     

## 2017-01-27 NOTE — Progress Notes (Signed)
Social Work  Social Work Assessment and Plan  Patient Details  Name: Donald Calderon MRN: [id number] Date of Birth: 06-01-1941  Today's Date: 01/27/2017  Problem List:  Patient Active Problem List   Diagnosis Date Noted  . Lumbar stenosis 01/26/2017  . Diabetes mellitus type 2 in obese ([place])   . Benign essential HTN   . ETOH abuse   . Chronic pain syndrome   . Hyponatremia   . Leukocytosis   . Acute blood loss anemia   . Epidural hematoma ([place]) 01/23/2017  . Status post lumbar surgery 01/21/2017  . Status post lumbar spine operation 01/21/2017  . Back pain, acute 09/27/2011  . NEUROPATHY 08/08/2010  . SWELLING MASS OR LUMP IN HEAD AND NECK 06/27/2010  . BENIGN PROSTATIC HYPERTROPHY, WITH URINARY OBSTRUCTION 06/19/2010  . PERIPHERAL NEUROPATHY 05/12/2010  . HYPOKALEMIA 03/18/2010  . 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 DIABETES MELLITUS, TYPE II 02/07/2010  . HYPERLIPIDEMIA 02/07/2010  . ALCOHOL ABUSE 02/07/2010  . HYPERTENSION 02/07/2010  . SPINAL STENOSIS, LUMBAR 02/07/2010   Past Medical History:  Past Medical History:  Diagnosis Date  . ALCOHOL ABUSE   . Anxiety   . Arthritis   . Depression   . Diabetes mellitus   . GERD (gastroesophageal reflux disease)   . Hypertension   . Pneumonia    Past Surgical History:  Past Surgical History:  Procedure Laterality Date  . BACK SURGERY      disc fro picched nerve times 2  . EYE SURGERY Right    Gaucoma  . LUMBAR LAMINECTOMY/DECOMPRESSION MICRODISCECTOMY N/A 01/21/2017   Procedure: Decompression Lumbar 2-3;  Surgeon: Melina Schools, MD;  Location: [facility];  Service: Orthopedics;  Laterality: N/A;  210 mins  . LUMBAR LAMINECTOMY/DECOMPRESSION MICRODISCECTOMY N/A 01/23/2017   Procedure: EXPLORATION AND EVACUATION OF HEMATOMA L2-L3;  Surgeon: Melina Schools, MD;  Location: [facility];  Service: Orthopedics;  Laterality: N/A;  . PROSTATE SURGERY     Social History:  reports that he quit smoking about 18 years ago. His smoking use included  Cigarettes. He has a 39.00 pack-year smoking history. He quit smokeless tobacco use about 19 years ago. His smokeless tobacco use included Chew. He reports that he drinks about 2.4 oz of alcohol per week . He reports that he does not use drugs.  Family / Support Systems Marital Status: (P) Married Patient Roles: (P) Spouse, Parent, Other (Comment) (grandparent) Spouse/Significant Other: (P) wife, Barbra Sarks @ (C) [phone] Children: (P) daughter, Sissy Hoff [facility]) @ (C) [phone] plus 3 adult sons living in [place] and [place]. Anticipated Caregiver: (P) wife Ability/Limitations of Caregiver: (P) Spouse can provide Sueprvision-light Min A Caregiver Availability: (P) 24/7 Family Dynamics: (P) Pt describes family as supportive and feels wife can provide any assistance he may need.  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  He denies any emotioanl distress and is optimistic about overall recovery as he notes seeing progress the past few days.   Recent Psychosocial Issues: None  Pyschiatric History: None Substance Abuse History: None  Patient / Family Perceptions, Expectations & Goals Pt/Family understanding of illness & functional limitations: Pt and family with good understanding of surgery performed and current functional limitations/ need for CIR. Premorbid  pt/family roles/activities: Pt was independent overall. Anticipated changes in roles/activities/participation: little change anticipated if able to reach a supervision level. Pt/family expectations/goals: "I want to be able to do as much as I can for myself."  [employer]: None Premorbid Home Care/DME Agencies: None Transportation available at discharge: yes  Discharge Planning Living Arrangements: Spouse/significant other Support Systems: Spouse/significant other, Children Type of Residence: Private residence Insurance Resources: Barrister's clerk) Financial Resources: [place] Referred: No Living Expenses: Own Money Management: Patient Does the patient have any problems obtaining your medications?: No Home Management: pt and wife Patient/Family Preliminary Plans: Pt to d/c home with wife who can provide light min assist. Social Work Anticipated Follow Up Needs: HH/OP Expected length of stay: 16-20 days   Clinical Impression Very pleasant gentleman here following back surgery (his 4th back surgery).  Good understanding of surgery itself and current functional limitations/ need for CIR.  He is overall optimistic as he has seen progress the past few days.  Denies any significant emotional distress.  Wife at home and can provide 24/7, light min assist.  Will follow  For support and d/c planning needs.  Chavela Justiniano 01/27/2017, 12:50 PM

## 2017-01-27 NOTE — Care Management Note (Signed)
[facility] Individual Statement of Services  Patient Name:  Donald Calderon  Date:  01/27/2017  Welcome to the [facility].  Our goal is to provide you with an individualized program based on your diagnosis and situation, designed to meet your specific needs.  With this comprehensive rehabilitation program, you will be expected to participate in at least 3 hours of rehabilitation therapies Monday-Friday, with modified therapy programming on the weekends.  Your rehabilitation program will include the following services:  Physical Therapy (PT), Occupational Therapy (OT), 24 hour per day rehabilitation nursing, Therapeutic Recreaction (TR), Case Management (Social Worker), Rehabilitation Medicine, Nutrition Services and Pharmacy Services  Weekly team conferences will be held on Tuesdays to discuss your progress.  Your Social Worker will talk with you frequently to get your input and to update you on team discussions.  Team conferences with you and your family in attendance may also be held.  Expected length of stay: 16-18 days   Overall anticipated outcome: supervision  Depending on your progress and recovery, your program may change. Your Social Worker will coordinate services and will keep you informed of any changes. Your Social Worker's name and contact numbers are listed  below.  The following services may also be recommended but are not provided by the [facility] will be made to provide these services after discharge if needed.  Arrangements include referral to agencies that provide these services.  Your insurance has been verified to be:  [employer] Your primary doctor is:  Juluis Mire  Pertinent information will be shared with your doctor and your insurance company.  Social Worker:  [place], [place] or ([phone]   Information discussed with and copy given to patient by: Lennart Pall, 01/27/2017, 12:52 PM

## 2017-01-27 NOTE — Evaluation (Signed)
Occupational Therapy Assessment and Plan  Patient Details  Name: Donald Calderon MRN: [id number] Date of Birth: 12-12-1940  OT Diagnosis: acute pain, lumbago (low back pain) and muscle weakness (generalized) Rehab Potential: Rehab Potential (ACUTE ONLY): Excellent ELOS: 16-20 days    Today's Date: 01/27/2017 OT Individual Time:1100-1200 and  [id number] OT Individual Time Calculation (min): 60 min and 76 min     Problem List:  Patient Active Problem List   Diagnosis Date Noted  . Lumbar stenosis 01/26/2017  . Diabetes mellitus type 2 in obese ([place])   . Benign essential HTN   . ETOH abuse   . Chronic pain syndrome   . Hyponatremia   . Leukocytosis   . Acute blood loss anemia   . Epidural hematoma ([place]) 01/23/2017  . Status post lumbar surgery 01/21/2017  . Status post lumbar spine operation 01/21/2017  . Back pain, acute 09/27/2011  . NEUROPATHY 08/08/2010  . SWELLING MASS OR LUMP IN HEAD AND NECK 06/27/2010  . BENIGN PROSTATIC HYPERTROPHY, WITH URINARY OBSTRUCTION 06/19/2010  . PERIPHERAL NEUROPATHY 05/12/2010  . HYPOKALEMIA 03/18/2010  . 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 DIABETES MELLITUS, TYPE II 02/07/2010  . HYPERLIPIDEMIA 02/07/2010  . ALCOHOL ABUSE 02/07/2010  . HYPERTENSION 02/07/2010  . SPINAL STENOSIS, LUMBAR 02/07/2010    Past Medical History:  Past Medical History:  Diagnosis Date  . ALCOHOL ABUSE   . Anxiety   . Arthritis   . Depression   . Diabetes mellitus   . GERD (gastroesophageal reflux disease)   . Hypertension   . Pneumonia    Past Surgical History:  Past Surgical History:  Procedure Laterality Date  . BACK SURGERY      disc fro picched nerve times 2  . EYE SURGERY Right    Gaucoma  . LUMBAR LAMINECTOMY/DECOMPRESSION MICRODISCECTOMY N/A 01/21/2017   Procedure: Decompression Lumbar 2-3;  Surgeon: Melina Schools, MD;  Location: [place];  Service: Orthopedics;  Laterality: N/A;  210 mins  . LUMBAR LAMINECTOMY/DECOMPRESSION MICRODISCECTOMY N/A 01/23/2017    Procedure: EXPLORATION AND EVACUATION OF HEMATOMA L2-L3;  Surgeon: Melina Schools, MD;  Location: [place];  Service: Orthopedics;  Laterality: N/A;  . PROSTATE SURGERY      Assessment & Plan Clinical Impression: Patient is a 76 y.o. year old male with history of T2DM, HTN, alcohol abuse, chronic back pain due to lumbar stenosis with RLE radiculopathy and neurogenic claudication who was admitted on 01/21/17 for exploration of L3/4 and lumbar decompression L2/L3 by Dr. Rolena Infante. Post op course complicated by increase in leg pain with difficulty walking on 8/11. MRI lumbar spin reviewed, showing L2 hematoma with mass effect. Per report, fluid collection at L2 laminectomy site with severe central canal stenosis and mass effect felt to be due to hematoma. He was taken to OR that day for evacuation of hematoma and treated with decadron X 2 days. He has had issues with BLE weakness with ataxia, urinary retention due to neurogenic bladder and incontinence of bowel. Has been requiring I/O caths due to retention. Back drain removed today. Therapy ongoing and patient with significant decline in mobility as well as ability to carry out ADL tasks. CIR recommended   Patient transferred to CIR on 01/26/2017 .    Patient currently requires max with basic self-care skills secondary to muscle weakness, decreased cardiorespiratoy endurance and decreased standing balance, decreased postural control, decreased balance strategies and difficulty maintaining precautions.  Prior to hospitalization, patient could complete ADLs and some IADLs with modified independent .  Patient will benefit  from skilled intervention to decrease level of assist with basic self-care skills and increase independence with basic self-care skills prior to discharge home with care partner.  Anticipate patient will require 24 hour supervision and TBD pending Pt progress.  OT - End of Session Activity Tolerance: Tolerates 30+ min activity with multiple  rests Endurance Deficit: Yes Endurance Deficit Description: requires rest breaks  OT Assessment Rehab Potential (ACUTE ONLY): Excellent OT Patient demonstrates impairments in the following area(s): Balance;Endurance;Motor;Pain OT Basic ADL's Functional Problem(s): Grooming;Bathing;Dressing;Toileting OT Transfers Functional Problem(s): Toilet;Tub/Shower OT Plan OT Intensity: Minimum of 1-2 x/day, 45 to 90 minutes OT Frequency: 5 out of 7 days OT Duration/Estimated Length of Stay: 16-20 days  OT Treatment/Interventions: Balance/vestibular training;Discharge planning;Pain management;Self Care/advanced ADL retraining;Therapeutic Activities;UE/LE Coordination activities;Disease mangement/prevention;Functional mobility training;Patient/family education;Therapeutic Exercise;Community reintegration;DME/adaptive equipment instruction;UE/LE Strength taining/ROM;Wheelchair propulsion/positioning OT Self Feeding Anticipated Outcome(s): independent OT Basic Self-Care Anticipated Outcome(s): supervision OT Toileting Anticipated Outcome(s): supervision  OT Bathroom Transfers Anticipated Outcome(s): supervision OT Recommendation Patient destination: Home Follow Up Recommendations: Other (comment) (TBD pending Pt progress ) Equipment Recommended: To be determined   Skilled Therapeutic Intervention OT eval completed with focus/explanation of OT role, CIR process, POC, expectations during stay.  Session 1 OT tx session completed with focus on functional mobility transfers, ADL self care retraining. Pt completes sit<>stand at RW x3 times throughout session with ModA, significant effort and time to rise to full standing, Min steady assist while standing at RW. Pt completes stand pivot transfers to [facility] over toilet with ModA, multimodal cues for hand placement prior to return to sitting. Pt requires total assist for perihygiene after BM. Completed grooming ADLs from w/c level at sink. Introduced Pt to AE,  educating on use of AE to increase adherence to back precautions. Pt verbalizes 3/3 back precautions independently this session. Pt left seated in w/c, needs within reach.  Session 2 with additional focus on ADL retraining. Pt completed bathing at shower level, ambulates from w/c at doorway to tub bench using RW with [place] for sit<>Stand at RW and [place] during mobility. Pt with decreased sensation in bil LEs, impacting his awareness of LE placement during mobility. Pt requires seated rest break after transfer to tub bench, requires verbal cues for deep breathing during transfers throughout session as pt appears to hold his breath when completing transfers (suspect partially due to pain). Pt completed bathing at sit<>stand level using long handled sponge and with therapist assist to wash back and buttocks, minA to rise to partial standing while therapist washes buttocks. Pt doffs hospital gown, spinal brace while seated on tub bench prior to shower, dons new gown and brace prior to transfer out of shower. ModA for donning brace, MinA to don gown. Pt completes LB dressing from sit<>stand level from w/c<>RW, educated on use of reacher to don briefs, with Pt demonstrating good understanding of use of AE, however requires overall MaxA for task completion this session. Educated on use of sock aide with Pt return demonstrating to don socks with MinA. Pt initially attempts to don second sock on foot already wearing sock, requires verbal cues to correct. Pt left seated in w/c at end of session, call bell and needs within reach, daughter arriving and present upon therapist departure.   OT Evaluation Precautions/Restrictions  Precautions Precautions: Fall;Back Precaution Comments: Pt is able to report 3/3 back precautions  Required Braces or Orthoses: Spinal Brace Spinal Brace: Lumbar corset;Applied in sitting position Restrictions Weight Bearing Restrictions: No General Chart Reviewed: Yes Family/Caregiver  Present: Yes (  daughter present at end of session 2) Vital Signs   Pain Pain Assessment Pain Assessment: Faces Faces Pain Scale: Hurts whole lot Pain Type: Acute pain Pain Location: Back Pain Orientation: Lower Pain Descriptors / Indicators: Aching;Sore;Operative site guarding;Grimacing;Discomfort Pain Onset: With Activity Pain Intervention(s): Rest;Repositioned Home Living/Prior Functioning Home Living Living Arrangements: Spouse/significant other Available Help at Discharge: Family Type of Home: House Home Access: Stairs to enter Technical brewer of Steps: 6 Entrance Stairs-Rails: Right, Left Home Layout: One level Bathroom Shower/Tub: Tub/shower unit, Architectural technologist: Handicapped height  Lives With: Spouse IADL History Homemaking Responsibilities: Yes Meal Prep Responsibility: Secondary Cleaning Responsibility: Secondary Homemaking Comments: shares IADL responsibility with spouse; spouse is able to complete if Pt is unable  Occupation: Retired, Other (comment) Type of Occupation: Pt is a retired Sales executive, now runs a Nurse, learning disability  IADL Comments: Pt does not drive Prior Function Level of Independence: Independent with basic ADLs, Independent with homemaking with ambulation, Independent with gait, Independent with transfers, Requires assistive device for independence Driving: No Vocation: Retired Comments: Used cane at baseline  ADL ADL ADL Comments: Please see functional navigator  Vision Baseline Vision/History: Wears glasses Additional Comments: Pt does not have glasses with him currently; visual deficits do not appear to interfere with functional task completion  Perception    Praxis   Cognition Overall Cognitive Status: No family/caregiver present to determine baseline cognitive functioning Arousal/Alertness: Awake/alert Orientation Level: Person;Place;Situation Person: Oriented Place: Oriented Situation: Oriented Year:  2018 Month: August Day of Week: Correct Memory: Appears intact Immediate Memory Recall: Sock;Blue;Bed Memory Recall: Blue;Bed Memory Recall Blue: With Cue Memory Recall Bed: With Cue Attention: Alternating Awareness: Appears intact Safety/Judgment: Other (comment) (requires min verbal safety cues ) Comments: cognition overall appears WFL; when donning socks using sock aide Pt does attempt to don sock on foot which already has sock on; requires cues to correct; Min verbal safety cues and cues for adhering to back precautions during shower task  Sensation Sensation Light Touch: Appears Intact (UEs) Coordination Gross Motor Movements are Fluid and Coordinated: Yes Fine Motor Movements are Fluid and Coordinated: Yes Motor  Motor Motor: Ataxia;Motor apraxia Motor - Skilled Clinical Observations: difficulty managing bil LEs during mobility  Mobility  Transfers Transfers: Sit to Stand;Stand to Sit Sit to Stand: 3: Mod assist Sit to Stand Details: Verbal cues for sequencing;Verbal cues for safe use of DME/AE;Tactile cues for posture;Tactile cues for placement;Manual facilitation for weight shifting;Verbal cues for precautions/safety Stand to Sit: 4: Min assist Stand to Sit Details (indicate cue type and reason): Tactile cues for weight shifting;Tactile cues for posture;Verbal cues for sequencing;Verbal cues for technique;Verbal cues for safe use of DME/AE;Verbal cues for precautions/safety  Trunk/Postural Assessment  Cervical Assessment Cervical Assessment: Within Functional Limits Thoracic Assessment Thoracic Assessment: Within Functional Limits Lumbar Assessment Lumbar Assessment: Exceptions to [facility] (s/p lumbar surgery ) Postural Control Postural Control: Within Functional Limits  Balance Balance Balance Assessed: Yes Static Sitting Balance Static Sitting - Balance Support: Feet supported Static Sitting - Level of Assistance: 5: Stand by assistance Dynamic Sitting  Balance Dynamic Sitting - Balance Support: Feet supported;Left upper extremity supported;Right upper extremity supported Dynamic Sitting - Level of Assistance: 5: Stand by assistance Dynamic Sitting - Balance Activities: Reaching for objects Static Standing Balance Static Standing - Balance Support: Bilateral upper extremity supported Static Standing - Level of Assistance: 4: Min assist Extremity/Trunk Assessment RUE Assessment RUE Assessment: Within Functional Limits LUE Assessment LUE Assessment: Within Functional Limits   See Function Navigator for  Current Functional Status.   Refer to Care Plan for Long Term Goals  Recommendations for other services: None    Discharge Criteria: Patient will be discharged from OT if patient refuses treatment 3 consecutive times without medical reason, if treatment goals not met, if there is a change in medical status, if patient makes no progress towards goals or if patient is discharged from hospital.  The above assessment, treatment plan, treatment alternatives and goals were discussed and mutually agreed upon: by patient  Raymondo Band 01/27/2017, 4:24 PM

## 2017-01-27 NOTE — H&P (Signed)
Physical Medicine and Rehabilitation Admission H&P    CC: Lumbar stenosis s/p decompression with hematoma and neurogenic B/B and BLE weakness.    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 Rolena Infante. Post op course complicated by increase in leg pain with difficulty walking on 8/11. MRI lumbar spin reviewed, showing L2 hematoma with mass effect. Per report, fluid collection at L2 laminectomy site with severe central canal stenosis and mass effect felt to be due to hematoma. He was taken to OR that day for evacuation of hematoma and treated with decadron X 2 days.  He has had issues with BLE weakness with ataxia, urinary retention due to neurogenic bladder and incontinence of bowel. Has been requiring I/O caths due to retention. Back drain removed today.  Therapy ongoing and patient with significant decline in mobility as well as ability to carry out ADL tasks. CIR recommended    Review of Systems  Constitutional: Negative for diaphoresis and fever.  HENT: Negative for hearing loss and tinnitus.   Eyes: Negative for blurred vision and double vision.  Respiratory: Negative for cough.   Cardiovascular: Negative for chest pain, palpitations and leg swelling.  Gastrointestinal: Positive for abdominal pain (due to bladder distension) and constipation (small bm today).  Musculoskeletal: Positive for back pain, falls (due to knee instability), joint pain (bilateral knees) and myalgias.  Skin: Negative for rash.  Neurological: Positive for sensory change and focal weakness. Negative for dizziness.  Psychiatric/Behavioral: Negative for depression. The patient has insomnia. The patient is not nervous/anxious.           Past Medical History:  Diagnosis Date  . ALCOHOL ABUSE     . Anxiety   . Arthritis   . Depression   . Diabetes mellitus   . GERD (gastroesophageal reflux disease)   . Hypertension   . Pneumonia          Past Surgical History:  Procedure Laterality Date  . BACK SURGERY      disc fro picched nerve times 2  . EYE SURGERY Right    Gaucoma  . LUMBAR LAMINECTOMY/DECOMPRESSION MICRODISCECTOMY N/A 01/21/2017   Procedure: Decompression Lumbar 2-3;  Surgeon: Melina Schools, MD;  Location: [facility];  Service: Orthopedics;  Laterality: N/A;  210 mins  . LUMBAR LAMINECTOMY/DECOMPRESSION MICRODISCECTOMY N/A 01/23/2017   Procedure: EXPLORATION AND EVACUATION OF HEMATOMA L2-L3;  Surgeon: Melina Schools, MD;  Location: [facility];  Service: Orthopedics;  Laterality: N/A;  . PROSTATE SURGERY           Family History  Problem Relation Age of Onset  . Diabetes Mother   . Diabetes Father     Social History:  Married. Retired Holiday representative. He was independent with cane/walker PTA. Does not drive He reports that he quit smoking about 18 years ago. His smoking use included Cigarettes. He has a 39.00 pack-year smoking history. He quit smokeless tobacco use about 19 years ago. His smokeless tobacco use included Chew. He reports that he has cut down to drinking about 3-4 shots of liquor/daily as well as beer occasionally. He reports that he does not use drugs.   Allergies  Allergen Reactions  . Penicillins Itching and Rash    Has patient had a PCN reaction causing immediate rash, facial/tongue/throat swelling, SOB or lightheadedness with hypotension: Yes  Has patient had a PCN reaction causing severe rash involving mucus membranes or skin necrosis: No Has patient had a PCN reaction that required hospitalization: No Has patient had a PCN reaction occurring within the last 10 years: No If all of the above answers are "NO", then may proceed with Cephalosporin use.            Medications Prior to Admission  Medication Sig Dispense  Refill  . amLODipine (NORVASC) 10 MG tablet Take 1 tablet (10 mg total) by mouth daily. 30 tablet 11  . BISACODYL 5 MG EC tablet Take 5 mg by mouth daily as needed for mild constipation.   0  . insulin aspart (NOVOLOG) 100 UNIT/ML injection Inject 4-10 Units into the skin 3 (three) times daily before meals. Per sliding scale    . LEVEMIR 100 UNIT/ML injection Inject 10-30 Units into the skin 2 (two) times daily. 10 units every morning, and 30 units every evening  2  . lisinopril (PRINIVIL,ZESTRIL) 20 MG tablet Take 1 tablet (20 mg total) by mouth daily. 30 tablet 11  . lisinopril-hydrochlorothiazide (PRINZIDE,ZESTORETIC) 20-25 MG tablet Take 1 tablet by mouth daily.  1  . naproxen sodium (ANAPROX) 220 MG tablet Take 220 mg by mouth 2 (two) times daily as needed (pain).    . pravastatin (PRAVACHOL) 10 MG tablet take 1 tablet by mouth daily in the evening  3  . dorzolamide-timolol (COSOPT) 22.3-6.8 MG/ML ophthalmic solution Place 1 drop into both eyes 2 (two) times daily.  6  . insulin glargine (LANTUS) 100 UNIT/ML injection Inject 40 Units into the skin at bedtime. 1000 mL 3  . LYRICA 100 MG capsule Take 100 mg by mouth 2 (two) times daily.  0  . potassium chloride (K-DUR) 10 MEQ tablet Take 1 tablet (10 mEq total) by mouth daily. 5 tablet 0    Home: Home Living Family/patient expects to be discharged to:: Private residence Living Arrangements: Spouse/significant other Available Help at Discharge: Family, Available 24 hours/day Type of Home: House Home Access: Stairs to enter [employer] of Steps: 6 Entrance Stairs-Rails: Right, Left Home Layout: One level Bathroom Shower/Tub: Tub/shower unit, Architectural technologist: Handicapped height Home Equipment: Environmental consultant - 2 wheels, Cane - single point, Shower seat, Grab bars - tub/shower, Grab bars - toilet   Functional History: Prior Function Level of Independence: Independent with assistive device(s) Comments: Used cane  at baseline   Functional Status:  Mobility: Bed Mobility Overal bed mobility: Needs Assistance Bed Mobility: Rolling, Sidelying to Sit Rolling: Min assist Sidelying to sit: Max assist Sit to sidelying: Mod assist General bed mobility comments: utilized log roll technique (Pt able to verbalize technique); requires MaxA for assisting LEs over EOB and bringing trunk into upright position during sidelying to sit Transfers Overall transfer level: Needs assistance Equipment used: Rolling walker (2 wheeled) Transfers: Sit to/from Stand, Stand Pivot Transfers Sit to Stand: +2 physical assistance, Mod assist Stand pivot transfers: +2 physical assistance, Mod assist General transfer comment: cues for hand placement, +2 ModA to rise and steady in standing, +2ModA for taking steps to pivot to recliner, Pt with decreased awareness/control of LE placement during mobility  Ambulation/Gait Ambulation/Gait assistance: Min guard Ambulation Distance (Feet): 50 Feet Assistive device: Rolling walker (2 wheeled) Gait Pattern/deviations: Step-through pattern, Decreased stride length, Step-to pattern, Trunk flexed General Gait Details: unable to safely progress at this time.  Gait velocity: Decreased Gait velocity interpretation: Below normal speed for age/gender  ADL: ADL Overall ADL's : Needs assistance/impaired Eating/Feeding:  Set up, Sitting Grooming: Set up, Sitting Upper Body Bathing: Set up, Bed level Lower Body Bathing: Maximal assistance, Sit to/from stand Upper Body Dressing : Minimal assistance, Sitting Upper Body Dressing Details (indicate cue type and reason): a Lower Body Dressing: Maximal assistance, Sit to/from stand Lower Body Dressing Details (indicate cue type and reason): with wife assisting Toilet Transfer: Maximal assistance, +2 for safety/equipment, +2 for physical assistance, RW Toilet Transfer Details (indicate cue type and reason): Pt stood at RW to use urinal, unable to  void bladder but is incontinent with BM during standing Toileting- Clothing Manipulation and Hygiene: Total assistance, +2 for physical assistance, +2 for safety/equipment Toileting - Clothing Manipulation Details (indicate cue type and reason): incontinent BM during session  Tub/Shower Transfer Details (indicate cue type and reason): Recommend not performing tub transfer initially due to pain and poor standing balance; pt and wife report pt will sponge bathe initially Functional mobility during ADLs: Moderate assistance, +2 for physical assistance, +2 for safety/equipment, Rolling walker General ADL Comments: Pt requires ModA +2 for completing stand pivot to recliner from EOB; requires multimodal cues for LE placement as Pt with decreased awareness of LE placement during mobility   Cognition: Cognition Overall Cognitive Status: Within Functional Limits for tasks assessed Orientation Level: Oriented X4 Cognition Arousal/Alertness: Awake/alert Behavior During Therapy: WFL for tasks assessed/performed Overall Cognitive Status: Within Functional Limits for tasks assessed    Blood pressure (!) 96/51, pulse 75, temperature 98.1 F (36.7 C), temperature source Oral, resp. rate 20, height 5\' 9"  (1.753 m), weight 94.3 kg (208 lb), SpO2 99 %. Physical Exam  Nursing note and vitals reviewed. Constitutional: He is oriented to person, place, and time. He appears well-developed and well-nourished. No distress.  HENT:  Head: Normocephalic and atraumatic.  Mouth/Throat: Oropharynx is clear and moist.  Eyes: Pupils are equal, round, and reactive to light. Conjunctivae and EOM are normal.  Neck: Normal range of motion. Neck supple.  Cardiovascular: Normal rate and regular rhythm.   Respiratory: Effort normal and breath sounds normal. No stridor.  GI: Soft. Bowel sounds are high pitched. He exhibits  distension. There is no tenderness.  Musculoskeletal: He exhibits no edema or tenderness.    Neurological: He is alert and oriented to person, place, and time.  Speech clear. Able to follow commands without difficulty.   Skin: Skin is warm and dry. He is not diaphoretic.  Honey comb dressing on back with dry blood.   Psychiatric: He has a normal mood and affect. His behavior is normal. Judgment and thought content normal.    Lab Results Last 48 Hours        Results for orders placed or performed during the hospital encounter of 01/21/17 (from the past 48 hour(s))  Glucose, capillary     Status: Abnormal   Collection Time: 01/24/17  4:21 PM  Result Value Ref Range   Glucose-Capillary 173 (H) 65 - 99 mg/dL   Comment 1 Notify RN    Comment 2 Document in Chart   Glucose, capillary     Status: Abnormal   Collection Time: 01/24/17  9:44 PM  Result Value Ref Range   Glucose-Capillary 215 (H) 65 - 99 mg/dL   Comment 1 Notify RN    Comment 2 Document in Chart   Glucose, capillary     Status: Abnormal   Collection Time: 01/25/17  6:24 AM  Result Value Ref Range   Glucose-Capillary 133 (H) 65 - 99 mg/dL   Comment 1 Notify RN  Comment 2 Document in Chart   Glucose, capillary     Status: None   Collection Time: 01/25/17 11:27 AM  Result Value Ref Range   Glucose-Capillary 99 65 - 99 mg/dL  Glucose, capillary     Status: Abnormal   Collection Time: 01/25/17  4:28 PM  Result Value Ref Range   Glucose-Capillary 125 (H) 65 - 99 mg/dL  Glucose, capillary     Status: Abnormal   Collection Time: 01/25/17  9:57 PM  Result Value Ref Range   Glucose-Capillary 190 (H) 65 - 99 mg/dL   Comment 1 Notify RN    Comment 2 Document in Chart   Glucose, capillary     Status: None   Collection Time: 01/26/17  6:10 AM  Result Value Ref Range   Glucose-Capillary 76 65 - 99 mg/dL   Comment 1 Notify RN    Comment 2 Document in Chart   Vancomycin, trough     Status: Abnormal   Collection Time: 01/26/17 11:19 AM  Result Value Ref Range   Vancomycin Tr 22  (HH) 15 - 20 ug/mL    Comment: CRITICAL RESULT CALLED TO, READ BACK BY AND VERIFIED WITH: H.MACINTOSH,RN 01/26/17 1211 BY BSLADE   Glucose, capillary     Status: Abnormal   Collection Time: 01/26/17 11:28 AM  Result Value Ref Range   Glucose-Capillary 194 (H) 65 - 99 mg/dL     Imaging Results (Last 48 hours)  No results found.       Medical Problem List and Plan: 1.  Functional deficits secondary to lumbar stenosis with neurogenic claudication               -admit to inpatient rehab 2.  DVT Prophylaxis/Anticoagulation: Mechanical: Sequential compression devices, below knee Bilateral lower extremities 3. Pain Management: Hydrocodone prn.  4. Mood: LCSW to follow for evaluation and support.  5. Neuropsych: This patient is capable of making decisions on not own behalf. 6. Skin/Wound Care: Monitor wound daily for healing.  7. Fluids/Electrolytes/Nutrition: Monitor I/O. Electrolytes reviewed in full.    -sl hyponatremia 8. HTN: Monitor BP bid with orthostatic vitals. He has been hypotensive requiring IVF. Will set parameters on Norvasc, HCTZ and prinivil.  9. T2DM: Monitor BS ac/hs. Continue Levemir bid with SSI for elevated BS--po intake reasonable. Titrate upwards as indicated. 10. BPH s/p TURP: I/O cath to keep bladder decompressed.  - Continue flomax.    -check UA/UCx  -OOB to void 11. Abdominal pain/Constipation:  Pt with large BM last night  -KUB with gas, no obstruction  -add simethicone  -regular soft/lax 12. Post-op anemia     Post Admission Physician Evaluation: 1. Functional deficits secondary  to lumbar stenosis. 2. Patient is admitted to receive collaborative, interdisciplinary care between the physiatrist, rehab nursing staff, and therapy team. 3. Patient's level of medical complexity and substantial therapy needs in context of that medical necessity cannot be provided at a lesser intensity of care such as a SNF. 4. Patient has experienced  substantial functional loss from his/her baseline which was documented above under the "Functional History" and "Functional Status" headings.  Judging by the patient's diagnosis, physical exam, and functional history, the patient has potential for functional progress which will result in measurable gains while on inpatient rehab.  These gains will be of substantial and practical use upon discharge  in facilitating mobility and self-care at the household level. 5. Physiatrist will provide 24 hour management of medical needs as well as oversight of the therapy plan/treatment  and provide guidance as appropriate regarding the interaction of the two. 6. The Preadmission Screening has been reviewed and patient status is unchanged unless otherwise stated above. 7. 24 hour rehab nursing will assist with bladder management, bowel management, safety, skin/wound care, disease management, medication administration, pain management and patient education  and help integrate therapy concepts, techniques,education, etc. 8. PT will assess and treat for/with: Lower extremity strength, range of motion, stamina, balance, functional mobility, safety, adaptive techniques and equipment, NMR, back precautions.   Goals are: supervision to min assist. 9. OT will assess and treat for/with: ADL's, functional mobility, safety, upper extremity strength, adaptive techniques and equipment, NMR, back precautions, family ed.   Goals are: supervision to min assist. Therapy may proceed with showering this patient. 10. SLP will assess and treat for/with: n/a.  Goals are: n/a. 11. Case Management and Social Worker will assess and treat for psychological issues and discharge planning. 12. Team conference will be held weekly to assess progress toward goals and to determine barriers to discharge. 13. Patient will receive at least 3 hours of therapy per day at least 5 days per week. 14. ELOS: 16-20 days       15. Prognosis:   excellent     Meredith Staggers, MD, [facility] 01/27/2017   Bary Leriche, Hershal Coria 01/26/2017

## 2017-01-27 NOTE — Progress Notes (Signed)
Physical Therapy Assessment and Plan  Patient Details  Name: Donald Calderon MRN: [id number] Date of Birth: 1940-09-23  PT Diagnosis: Abnormality of gait, Ataxia, Difficulty walking, Impaired sensation and Muscle weakness Rehab Potential: Good ELOS: 16-18 days   Today's Date: 01/27/2017 PT Individual Time: 0900-1000 PT Individual Time Calculation (min): 60 min    Problem List:  Patient Active Problem List   Diagnosis Date Noted  . Lumbar stenosis 01/26/2017  . Diabetes mellitus type 2 in obese ([place])   . Benign essential HTN   . ETOH abuse   . Chronic pain syndrome   . Hyponatremia   . Leukocytosis   . Acute blood loss anemia   . Epidural hematoma ([place]) 01/23/2017  . Status post lumbar surgery 01/21/2017  . Status post lumbar spine operation 01/21/2017  . Back pain, acute 09/27/2011  . NEUROPATHY 08/08/2010  . SWELLING MASS OR LUMP IN HEAD AND NECK 06/27/2010  . BENIGN PROSTATIC HYPERTROPHY, WITH URINARY OBSTRUCTION 06/19/2010  . PERIPHERAL NEUROPATHY 05/12/2010  . HYPOKALEMIA 03/18/2010  . 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 DIABETES MELLITUS, TYPE II 02/07/2010  . HYPERLIPIDEMIA 02/07/2010  . ALCOHOL ABUSE 02/07/2010  . HYPERTENSION 02/07/2010  . SPINAL STENOSIS, LUMBAR 02/07/2010    Past Medical History:  Past Medical History:  Diagnosis Date  . ALCOHOL ABUSE   . Anxiety   . Arthritis   . Depression   . Diabetes mellitus   . GERD (gastroesophageal reflux disease)   . Hypertension   . Pneumonia    Past Surgical History:  Past Surgical History:  Procedure Laterality Date  . BACK SURGERY      disc fro picched nerve times 2  . EYE SURGERY Right    Gaucoma  . LUMBAR LAMINECTOMY/DECOMPRESSION MICRODISCECTOMY N/A 01/21/2017   Procedure: Decompression Lumbar 2-3;  Surgeon: Melina Schools, MD;  Location: [facility];  Service: Orthopedics;  Laterality: N/A;  210 mins  . LUMBAR LAMINECTOMY/DECOMPRESSION MICRODISCECTOMY N/A 01/23/2017   Procedure: EXPLORATION AND EVACUATION OF HEMATOMA  L2-L3;  Surgeon: Melina Schools, MD;  Location: [facility];  Service: Orthopedics;  Laterality: N/A;  . PROSTATE SURGERY      Assessment & Plan Clinical Impression: Patient is a 76 y.o.malewith history of T2DM, HTN, alcohol abuse, chronic back pain due to lumbar stenosis with RLE radiculopathy and neurogenic claudication who was admitted on 01/21/17 for exploration of L3/4 and lumbar decompression L2/L3 by Dr. Rolena Infante. Post op course complicated by increase in leg pain with difficulty walking on 8/11. MRI lumbar spin reviewed, showing L2 hematoma with mass effect. Per report, fluid collection at L2 laminectomy site with severe central canal stenosis and mass effect felt to be due to hematoma. He was taken to OR that day for evacuation of hematoma and treated with decadron X 2 days.  He has had issues with BLE weakness with ataxia, urinary retention due to neurogenic bladder and incontinence of bowel. Has been requiring I/O caths due to retention. Back drain removed today.  Therapy ongoing and patient with significant decline in mobility as well as ability to carry out ADL tasks.  Patient transferred to CIR on 01/26/2017 .   Patient currently requires min/ mod with mobility secondary to muscle weakness, decreased cardiorespiratoy endurance, impaired timing and sequencing, motor apraxia, ataxia, decreased coordination and decreased motor planning and decreased standing balance, decreased postural control and decreased balance strategies.  Prior to hospitalization, patient was modified independent  with mobility and lived with Spouse in a House home.  Home access is 6Stairs to enter.  Patient will benefit from skilled PT intervention to maximize safe functional mobility, minimize fall risk and decrease caregiver burden for planned discharge home with 24 hour supervision.  Anticipate patient will benefit from follow up [place] at discharge.  PT - End of Session Activity Tolerance: Tolerates 30+ min activity with  multiple rests Endurance Deficit: Yes Endurance Deficit Description: Rest breaks after short distances for ambulation  PT Assessment Rehab Potential (ACUTE/IP ONLY): Good PT Barriers to Discharge: Inaccessible home environment PT Patient demonstrates impairments in the following area(s): Balance;Endurance;Motor;Pain;Safety PT Transfers Functional Problem(s): Bed Mobility;Bed to Chair;Car;Furniture PT Locomotion Functional Problem(s): Ambulation;Stairs PT Plan PT Intensity: Minimum of 1-2 x/day ,45 to 90 minutes PT Frequency: 5 out of 7 days PT Treatment/Interventions: Ambulation/gait training;Community reintegration;DME/adaptive equipment instruction;Neuromuscular re-education;Stair training;UE/LE Strength taining/ROM;Training and development officer;Therapeutic Activities;UE/LE Coordination activities;Functional mobility training;Patient/family education;Therapeutic Exercise;Discharge planning PT Transfers Anticipated Outcome(s): Supervision PT Locomotion Anticipated Outcome(s): Supervision PT Recommendation Recommendations for Other Services: Neuropsych consult;Therapeutic Recreation consult Therapeutic Recreation Interventions: Pet therapy;Stress management Follow Up Recommendations: Home health PT;24 hour supervision/assistance Patient destination: Home Equipment Details: Pt has cane and walker at home  Skilled Therapeutic Intervention Pt received lying in bed, agreeable to therapy. Wife present briefly. Pt able to report back precautions. Pt reported feeling like he needed to go to the bathroom. Pt required min assist for rolling on flat bed for LE management. Pt required mod assist with sidelying to sit. Mod assist for sit to stand. Pt had 1 incontient bowel movement. Max assist for hygiene. Transferred to bed side with RW & min assist for pt to continue with BM. Cues for reaching back.  Pt able to apply back brace with min assist for positioning. Sit to stand with min assist and RW. Pt max  assist for hygiene and don of brief. Pt walked 15 ft with min assist and RW. Reported feeling like "legs will not hold him up"  Displayed ataxic gait, narrow BOS, some scissoring, and excess use of UE support. Pt with difficulty initiating steps. Due to time, unable to assess further mobility. Pt left sitting up in wheelchair, brace on, all needs in reach.  PT Evaluation Precautions/Restrictions Precautions Precautions: Fall;Back Required Braces or Orthoses: Spinal Brace Spinal Brace: Lumbar corset;Applied in sitting position Restrictions Weight Bearing Restrictions: No Other Position/Activity Restrictions: At all times Pain Pain Assessment Pain Assessment: 0-10 Pain Score: 7  (0 at rest; 7 while moving ) Pain Type: Acute pain Pain Location: Bladder Pain Orientation: Lower Pain Descriptors / Indicators: Discomfort Pain Onset: Gradual Patients Stated Pain Goal: 0 Pain Intervention(s): Other (Comment) (intermittent cath) Home Living/Prior Functioning Home Living Available Help at Discharge: Family Type of Home: House Home Access: Stairs to enter Technical brewer of Steps: 6 Entrance Stairs-Rails: Right;Left Home Layout: One level Bathroom Shower/Tub: Product/process development scientist: Handicapped height  Lives With: Spouse Prior Function Comments: Used cane at baseline  Vision/Perception  Vision - Assessment Additional Comments: No visual deficits effecting mobility   Cognition Overall Cognitive Status: Within Functional Limits for tasks assessed Orientation Level: Oriented X4 Awareness: Appears intact Safety/Judgment: Appears intact Sensation Sensation Light Touch: Appears Intact;Impaired Detail Light Touch Impaired Details: Impaired RLE (Can detect light touch but more "numb" compared to LLE) Proprioception: Impaired Detail Proprioception Impaired Details: Impaired RLE;Impaired LLE Additional Comments: Unable to detect position of hallax bilaterally   Coordination Heel Shin Test: Able to complete full range with slower movements  Motor  Motor Motor: Ataxia;Motor apraxia;Other (comment) (General weakness)  Mobility Bed Mobility Bed Mobility: Rolling Right;Right Sidelying to  Sit Rolling Right: 4: Min assist (for LE management ) Rolling Right Details: Verbal cues for technique;Tactile cues for initiation;Manual facilitation for placement Rolling Right Details (indicate cue type and reason): assist with initiating LE movement; cues for reaching, bending knees  Right Sidelying to Sit: 3: Mod assist Right Sidelying to Sit Details: Manual facilitation for placement;Tactile cues for initiation;Verbal cues for technique Right Sidelying to Sit Details (indicate cue type and reason): Assist with trunk management during push up; cues for push up, hand placement Transfers Transfers: Yes Sit to Stand: 3: Mod assist (Mod assist for initial sit to stand; min for rest of session) Sit to Stand Details: Verbal cues for sequencing;Verbal cues for safe use of DME/AE Stand to Sit: 4: Min guard Stand Pivot Transfers: 3: Mod assist (mod assist for initial stand, min for rest of session) Locomotion  Ambulation Ambulation: Yes Ambulation/Gait Assistance: 4: Min guard Ambulation Distance (Feet): 15 Feet Assistive device: Rolling walker Gait Gait: Yes Gait Pattern: Impaired Gait Pattern: Decreased hip/knee flexion - right;Decreased hip/knee flexion - left;Poor foot clearance - right;Poor foot clearance - left;Narrow base of support;Ataxic Gait velocity: very slow gait  Stairs / Additional Locomotion Stairs: Yes Stairs Assistance: Other (comment) (Did not perform due to time) Ramp:  (Did not perform due to time) Curb:  (Did not perform due to time)  Trunk/Postural Assessment  Lumbar Assessment Lumbar Assessment: Exceptions to [facility] (Lumbar in brace) Postural Control Postural Control: Within Functional Limits  Balance Balance Balance Assessed:  Yes Static Sitting Balance Static Sitting - Balance Support: Feet supported Static Sitting - Level of Assistance: 5: Stand by assistance Dynamic Sitting Balance Dynamic Sitting - Balance Support: Feet supported;Bilateral upper extremity supported Dynamic Sitting - Level of Assistance: 5: Stand by assistance Dynamic Sitting - Balance Activities: Other (comment) (MMT & ROM) Static Standing Balance Static Standing - Balance Support: Bilateral upper extremity supported Static Standing - Level of Assistance: 4: Min assist (for safety) Extremity Assessment  RUE Assessment RUE Assessment: Within Functional Limits LUE Assessment LUE Assessment: Within Functional Limits RLE Assessment RLE Assessment: Exceptions to [facility] RLE Strength Right Hip Flexion: 4-/5 Right Hip ABduction: 3+/5 Right Hip ADduction: 4/5 Right Knee Flexion: 3+/5 Right Knee Extension: 4/5 Right Ankle Dorsiflexion: 3-/5 Right Ankle Plantar Flexion: 3-/5 LLE Assessment LLE Assessment: Exceptions to [facility] LLE Strength Left Hip Flexion: 4-/5 Left Hip ABduction: 3+/5 Left Hip ADduction: 4/5 Left Knee Flexion: 3+/5 Left Knee Extension: 4/5 Left Ankle Dorsiflexion: 3-/5 Left Ankle Plantar Flexion: 3-/5   See Function Navigator for Current Functional Status.   Refer to Care Plan for Long Term Goals  Recommendations for other services: Neuropsych and Therapeutic Recreation  Pet therapy and Stress management  Discharge Criteria: Patient will be discharged from PT if patient refuses treatment 3 consecutive times without medical reason, if treatment goals not met, if there is a change in medical status, if patient makes no progress towards goals or if patient is discharged from hospital.  The above assessment, treatment plan, treatment alternatives and goals were discussed and mutually agreed upon: by patient  Gwinda Passe, SPT 01/27/2017, 10:38 AM

## 2017-01-28 ENCOUNTER — Inpatient Hospital Stay (HOSPITAL_COMMUNITY): Payer: Medicare HMO | Admitting: Occupational Therapy

## 2017-01-28 ENCOUNTER — Inpatient Hospital Stay (HOSPITAL_COMMUNITY): Payer: Medicare HMO | Admitting: Physical Therapy

## 2017-01-28 DIAGNOSIS — E1121 Type 2 diabetes mellitus with diabetic nephropathy: Secondary | ICD-10-CM

## 2017-01-28 DIAGNOSIS — R339 Retention of urine, unspecified: Secondary | ICD-10-CM

## 2017-01-28 DIAGNOSIS — Z794 Long term (current) use of insulin: Secondary | ICD-10-CM

## 2017-01-28 LAB — GLUCOSE, CAPILLARY
GLUCOSE-CAPILLARY: 114 mg/dL — AB (ref 65–99)
GLUCOSE-CAPILLARY: 165 mg/dL — AB (ref 65–99)
GLUCOSE-CAPILLARY: 183 mg/dL — AB (ref 65–99)
GLUCOSE-CAPILLARY: 231 mg/dL — AB (ref 65–99)

## 2017-01-28 LAB — URINE CULTURE: Culture: NO GROWTH

## 2017-01-28 MED ORDER — LIDOCAINE HCL 2 % EX GEL
CUTANEOUS | Status: DC | PRN
  Administered 2017-02-01 – 2017-02-02 (×2): via URETHRAL
  Administered 2017-02-03: 1 via URETHRAL
  Administered 2017-02-04 – 2017-02-07 (×4): 5 via URETHRAL
  Filled 2017-01-28 (×10): qty 5

## 2017-01-28 MED ORDER — BETHANECHOL CHLORIDE 10 MG PO TABS
10.0000 mg | ORAL_TABLET | Freq: Three times a day (TID) | ORAL | Status: DC
  Administered 2017-01-28 – 2017-01-29 (×4): 10 mg via ORAL
  Filled 2017-01-28 (×4): qty 1

## 2017-01-28 NOTE — Progress Notes (Signed)
Physical Therapy Session Note  Patient Details  Name: Donald Calderon MRN: [id number] Date of Birth: 1941-01-17  Today's Date: 01/28/2017 PT Individual Time: 1400-1500 PT Individual Time Calculation (min): 60 min   Short Term Goals: Week 1:  PT Short Term Goal 1 (Week 1): Pt will perform lying to sit with min assist and min cues  PT Short Term Goal 2 (Week 1): Pt will sit to stand with min assist consistantly through session PT Short Term Goal 3 (Week 1): Pt will ambulate 50 feet with RW and min guard PT Short Term Goal 4 (Week 1): Pt will initiate stair training   Skilled Therapeutic Interventions/Progress Updates: Pt received seated in w/c, c/o pain as below and requested pain medication from RN at beginning of session which was quickly administered. Performed seated LE strengthening/coordination exercises in room including hip flexion marching, long arc quad, glute sets, hip adduction isometric, hip abduction with orange theraband. Each exercise performed 3 sets 15 reps. Transported to gym [place]. Stand pivot transfer minA with RW to mat table. Standing alternating toe taps to 2" step for focus on upright posture, standing tolerance, weight shifting, single limb stance and LE coordination. Performed 2 trials at 10-15 taps per LE each trial; improved coordination with repetition. Static stance on airex foam pad with min guard overall; only able to demonstrate 2-3 second standing before LOB requiring UE use on RW to regain balance. Educated pt on goal of activities and carryover into gait and functional activities. Gait x25' with RW and min guard with w/c follow for safety d/t uncertain distance before fatigue; mod verbal/tactile cues for upright posture and reduced weight bearing through UEs. Note improved LE progression and foot clearance compared to gait trial yesterday. Stand pivot transfer to bed with minA and RW. Pt doffs clothing, dons clean brief in standing with totalA. Sit >supine maxA for LE  management. Remained supine in bed with all needs in reach. RN alerted to pt concern with inconsistent catheter schedule at night.      Therapy Documentation Precautions:  Precautions Precautions: Fall, Back Precaution Comments: Pt is able to report 3/3 back precautions  Required Braces or Orthoses: Spinal Brace Spinal Brace: Lumbar corset, Applied in sitting position Restrictions Weight Bearing Restrictions: No Other Position/Activity Restrictions: At all times Pain: Pain Assessment Pain Assessment: 0-10 Pain Score: 8  Pain Type: Acute pain Pain Location: Calf Pain Orientation: Right;Left Pain Descriptors / Indicators: Sharp Pain Frequency: Intermittent Pain Onset: With Activity Patients Stated Pain Goal: 3 Pain Intervention(s): Medication (See eMAR)   See Function Navigator for Current Functional Status.   Therapy/Group: Individual Therapy  Luberta Mutter 01/28/2017, 3:21 PM

## 2017-01-28 NOTE — Progress Notes (Signed)
[facility] MEDICINE [facility] REHABILITATION     PROGRESS NOTE    Subjective/Complaints: Had a reasonable night. stil having trouble emptying bladder. Volumes 400-800cc. Having some urge to empty. Belly less distended  ROS: pt denies nausea, vomiting, diarrhea, cough, shortness of breath or chest pain   Objective: Vital Signs: Blood pressure 129/67, pulse (!) 57, temperature 97.9 F (36.6 C), temperature source Oral, resp. rate 18, height 5\' 9"  (1.753 m), weight 90.4 kg (199 lb 3.2 oz), SpO2 99 %. Dg Abd 1 View  Result Date: 01/26/2017 CLINICAL DATA:  Abdomen pain EXAM: ABDOMEN - 1 VIEW COMPARISON:  CT 10/02/2015 FINDINGS: Postsurgical changes of the lumbar spine. Mild diffuse increased gas throughout. No abnormal calcification. IMPRESSION: Mild diffuse increased bowel gas throughout but with no evidence for obstruction. Electronically Signed   By: Donavan Foil M.D.   On: 01/26/2017 20:46    Recent Labs  01/27/17 0528  WBC 8.6  HGB 10.2*  HCT 30.4*  PLT 268    Recent Labs  01/27/17 0528  NA 133*  K 3.5  CL 97*  GLUCOSE 131*  BUN 14  CREATININE 1.10  CALCIUM 8.7*   CBG (last 3)   Recent Labs  01/27/17 1640 01/27/17 2048 01/28/17 0619  GLUCAP 151* 216* 114*    Wt Readings from Last 3 Encounters:  01/28/17 90.4 kg (199 lb 3.2 oz)  01/22/17 94.3 kg (208 lb)  01/15/17 94.5 kg (208 lb 6.4 oz)    Physical Exam:  Constitutional: He is oriented to person, place, and time. He appears well-developedand well-nourished. No distress.  HENT:  Head: Normocephalicand atraumatic.  Mouth/Throat: Oropharynx is clear and moist.  Eyes: Pupils are equal, round, and reactive to light. Conjunctivaeand EOMare normal.  Neck: Normal range of motion. Neck supple.  Cardiovascular: RRR without murmur. No JVD   Respiratory: CTA Bilaterally without wheezes or rales. Normal effort   GI: Soft. Bowel sounds are normal. He exhibits less  distension. There is no tenderness.   Musculoskeletal: He exhibits no edemaor tenderness.  Neurological: He is alertand oriented to person, place, and time.  Speech clear. Able to follow commands without difficulty.  Skin: Skin is warmand dry. He is not diaphoretic.  Honey comb dressing on back, incision intact Psychiatric: He has a normal mood and affect. His behavior is normal. Judgmentand thought contentnormal.   Assessment/Plan: 1. Functional deficits secondary to lumbar stenosis/neurogenic claudication which require 3+ hours per day of interdisciplinary therapy in a comprehensive inpatient rehab setting. Physiatrist is providing close team supervision and 24 hour management of active medical problems listed below. Physiatrist and rehab team continue to assess barriers to discharge/monitor patient progress toward functional and medical goals.  Function:  Bathing Bathing position   Position: Shower  Bathing parts Body parts bathed by patient: Right arm, Left arm, Chest, Abdomen, Front perineal area, Right upper leg, Left upper leg, Right lower leg, Left lower leg (using long handled sponge ) Body parts bathed by helper: Back, Buttocks  Bathing assist Assist Level: Touching or steadying assistance(Pt > 75%)      Upper Body Dressing/Undressing Upper body dressing   What is the patient wearing?: Hospital gown                Upper body assist Assist Level: Touching or steadying assistance(Pt > 75%)      Lower Body Dressing/Undressing Lower body dressing   What is the patient wearing?: Underwear, Non-skid slipper socks   Underwear - Performed by helper: Pull underwear  up/down, Thread/unthread right underwear leg, Thread/unthread left underwear leg     Non-skid slipper socks- Performed by patient: Don/doff right sock, Don/doff left sock                    Lower body assist Assist for lower body dressing:  (MaxA, using reacher )      Toileting Toileting     Toileting steps completed by  helper: Adjust clothing prior to toileting, Performs perineal hygiene, Adjust clothing after toileting Toileting Assistive Devices: Grab bar or rail  Toileting assist Assist level:  (TotalA)   Transfers Chair/bed transfer     Chair/bed transfer assist level: Moderate assist (Pt 50 - 74%/lift or lower) (Mod assist first stand, min assist rest of session) Chair/bed transfer assistive device: Medical sales representative     Max distance: 15 Assist level: Touching or steadying assistance (Pt > 75%)   Wheelchair Wheelchair activity did not occur: Safety/medical concerns (fatigue)        Cognition Comprehension Comprehension assist level: Understands complex 90% of the time/cues 10% of the time  Expression Expression assist level: Expresses basic needs/ideas: With no assist  Social Interaction Social Interaction assist level: Interacts appropriately with others - No medications needed.  Problem Solving Problem solving assist level: Solves complex 90% of the time/cues < 10% of the time  Memory Memory assist level: More than reasonable amount of time   Medical Problem List and Plan: 1. Functional deficitssecondary to lumbar stenosis with neurogenic claudication -admit to inpatient rehab 2. DVT Prophylaxis/Anticoagulation: Mechanical: Sequential compression devices, below knee Bilateral lower extremities 3. Pain Management: Hydrocodone prn.  4. Mood: LCSW to follow for evaluation and support.  5. Neuropsych: This patient iscapable of making decisions on notown behalf. 6. Skin/Wound Care: Monitor wound daily for healing.  7. Fluids/Electrolytes/Nutrition: Monitor I/O.               -sl hyponatremia 8. HTN: Monitor BP bid with orthostatic vitals. Hold parameters on Norvasc, HCTZ and prinivil.  9. T2DM: reasonable control at present. Continue Levemir bid with SSI for elevated BS--po intake reasonable. Titrate upwards as indicated. 10. BPH s/p TURP: I/O cath to keep  bladder decompressed.           - Continue flomax.            -UA neg, ucx pending           -needs to be OOB to void  -add urecholine to assist emptying 11. Abdominal pain/Constipation: Pt with large BM last night           -KUB with gas, no obstruction           -added simethicone           -regular soft/lax 12. Post-op anemia   LOS (Days) 2 A FACE TO FACE EVALUATION WAS PERFORMED  Meredith Staggers, MD 01/28/2017 9:03 AM

## 2017-01-28 NOTE — Plan of Care (Signed)
Problem: SCI BOWEL ELIMINATION Goal: RH STG MANAGE BOWEL WITH ASSISTANCE STG Manage Bowel with min Assistance.  Outcome: Not Progressing Pt required enema today  Problem: SCI BLADDER ELIMINATION Goal: RH STG MANAGE BLADDER WITH MEDICATION WITH ASSISTANCE STG Manage Bladder With Medication With min Assistance.  Outcome: Not Progressing Pt requiring I&O caths every 3-4 hours Goal: RH STG MANAGE BLADDER WITH EQUIPMENT WITH ASSISTANCE STG Manage Bladder With Equipment With min Assistance  Outcome: Not Progressing Pt requring I&O caths q 3-4 hours  Problem: RH SKIN INTEGRITY Goal: RH STG SKIN FREE OF INFECTION/BREAKDOWN Incision without infection, pt/cg perform incision care with min assist Outcome: Progressing No s/s of infection  Problem: RH PAIN MANAGEMENT Goal: RH STG PAIN MANAGED AT OR BELOW PT'S PAIN GOAL Outcome: Progressing Rare c/o pain

## 2017-01-28 NOTE — Progress Notes (Addendum)
Occupational Therapy Session Note  Patient Details  Name: Donald Calderon MRN: [id number] Date of Birth: 22-Dec-1940  Today's Date: 01/28/2017 OT Individual Time: 0938-1829 and  9371-6967 OT Individual Time Calculation (min): 76 min and 61 min    Short Term Goals: Week 1:  OT Short Term Goal 1 (Week 1): Pt will complete 2/3 steps for LB dressing using AE.  OT Short Term Goal 2 (Week 1): Pt will transfer to BSC/BSC over toilet with MinA.  OT Short Term Goal 3 (Week 1): Pt will complete sit<>stand with MinA during bathing task.  OT Short Term Goal 4 (Week 1): Pt will demonstrate adherence to back precautions during ADL completion with Min verbal cues.   Skilled Therapeutic Interventions/Progress Updates:    Session 1 with focus on ADL retraining, functional mobility transfers, and AE education. Pt received supine in bed, completes bed mobility using log roll technique with ModA for brining trunk upright. Pt completes sit<>stand at RW throughout session with ModA, completes stand pivot EOB>w/c with MinA, w/c<>tub bench with ModA using RW. Pt completes bathing at sit<>stand level in shower, assist to wash back and buttocks with MinA for sit<>stand, Pt uses long handled sponge to wash lower extremities. Intermittently requires min verbal cues to adhere to back precautions during task completion. Pt doffs gown and spinal brace, dons overhead shirt with setup and spinal brace with ModA both while sitting on tub bench before/after bathing task. Pt transfers to w/c to complete LB dressing. Pt demonstrates good recall of using AE for LB dressing, requires assist to advance briefs/shorts over hips while standing at RW, minA to adjust socks after donning using sock aide. Pt left seated in w/c, call bell and needs within reach.   Session 2 with focus on endurance, UB/LB strengthening, and functional mobility transfers. Pt self propels w/c to rehab gym, completes multiple sit<>stands at RW, initially requiring  ModA, progresses to MinA at end of session. Additional focus on single UE support (vs bil UE) while standing to increase standing balance. Pt requires MinA to steady in standing, intermittent ModA. Pt attempts standing without UE support however is very unsteady at this time, requires increased (ModA) to steady. Pt completes standing activities including placing cards on vertical board placed in front of Pt and advancing theraband over hips in standing to simulate LB dressing task with Pt able to advance approx 50% of theraband this session. Pt propelled w/c to day room, transfers to nustep via stand pivot with RW with MinA, completes nustep for 5 min for increased endurance/strengthening. Pt returns to w/c with MinA for stand pivot, Pt with 1 LOB during transfer requiring ModA to steady/correct, returns to room in manner described above where Pt was left seated in w/c, call bell and needs within reach.   Therapy Documentation Precautions:  Precautions Precautions: Fall, Back Precaution Comments: Pt is able to report 3/3 back precautions  Required Braces or Orthoses: Spinal Brace Spinal Brace: Lumbar corset, Applied in sitting position Restrictions Weight Bearing Restrictions: No Other Position/Activity Restrictions: At all times  Pain: Pain Assessment Pain Assessment: Faces Pain Score: 8  Faces Pain Scale: Hurts even more Pain Type: Acute pain Pain Location: Back Pain Orientation: Lower Pain Descriptors / Indicators: Aching;Grimacing;Operative site guarding Pain Frequency: Intermittent Pain Onset: With Activity Patients Stated Pain Goal: 3 Pain Intervention(s): Repositioned;Rest ADL: ADL ADL Comments: Please see functional navigator   See Function Navigator for Current Functional Status.   Therapy/Group: Individual Therapy  Donald Calderon 01/28/2017, 3:48 PM

## 2017-01-29 ENCOUNTER — Inpatient Hospital Stay (HOSPITAL_COMMUNITY): Payer: Medicare HMO

## 2017-01-29 ENCOUNTER — Ambulatory Visit (HOSPITAL_COMMUNITY): Payer: Medicare HMO | Admitting: Physical Therapy

## 2017-01-29 DIAGNOSIS — R339 Retention of urine, unspecified: Secondary | ICD-10-CM

## 2017-01-29 DIAGNOSIS — E1165 Type 2 diabetes mellitus with hyperglycemia: Secondary | ICD-10-CM

## 2017-01-29 DIAGNOSIS — R269 Unspecified abnormalities of gait and mobility: Secondary | ICD-10-CM

## 2017-01-29 LAB — GLUCOSE, CAPILLARY
GLUCOSE-CAPILLARY: 224 mg/dL — AB (ref 65–99)
GLUCOSE-CAPILLARY: 73 mg/dL (ref 65–99)
GLUCOSE-CAPILLARY: 163 mg/dL — AB (ref 65–99)
GLUCOSE-CAPILLARY: 157 mg/dL — AB (ref 65–99)

## 2017-01-29 MED ORDER — INSULIN DETEMIR 100 UNIT/ML ~~LOC~~ SOLN
12.0000 [IU] | Freq: Every day | SUBCUTANEOUS | Status: DC
  Administered 2017-01-30 – 2017-02-05 (×7): 12 [IU] via SUBCUTANEOUS
  Filled 2017-01-29 (×7): qty 0.12

## 2017-01-29 MED ORDER — BETHANECHOL CHLORIDE 25 MG PO TABS
25.0000 mg | ORAL_TABLET | Freq: Three times a day (TID) | ORAL | Status: DC
  Administered 2017-01-29 – 2017-02-12 (×42): 25 mg via ORAL
  Filled 2017-01-29 (×42): qty 1

## 2017-01-29 MED ORDER — INSULIN DETEMIR 100 UNIT/ML ~~LOC~~ SOLN
34.0000 [IU] | Freq: Every day | SUBCUTANEOUS | Status: DC
  Administered 2017-01-29 – 2017-02-11 (×14): 34 [IU] via SUBCUTANEOUS
  Filled 2017-01-29 (×14): qty 0.34

## 2017-01-29 NOTE — Progress Notes (Signed)
Occupational Therapy Session Note  Patient Details  Name: FAISAL STRADLING MRN: [id number] Date of Birth: 1940-09-15  Today's Date: 01/29/2017 OT Individual Time: 0800-0900 OT Individual Time Calculation (min): 60 min    Short Term Goals: Week 1:  OT Short Term Goal 1 (Week 1): Pt will complete 2/3 steps for LB dressing using AE.  OT Short Term Goal 2 (Week 1): Pt will transfer to BSC/BSC over toilet with MinA.  OT Short Term Goal 3 (Week 1): Pt will complete sit<>stand with MinA during bathing task.  OT Short Term Goal 4 (Week 1): Pt will demonstrate adherence to back precautions during ADL completion with Min verbal cues.   Skilled Therapeutic Interventions/Progress Updates:    1:1. Focus of session on sit to stand, unilateral support while standing with RW in prep for clothing management, w/c propulsion and endurance. Pt propels w/c with supervision to/from all tx destinations for general conditioning. Pt completes 4 sit to stands with min A and steadying balance while completing card matching activity on board in front of pt reaching laterally and anteriorly from waist height to above shoulder height. Pt requires prolonged rest breaks 2/2 fatifue and pain. Pt requires VC and tactile cues for posture while standing. Exited session with pt seated in w/c with call light in reahc and all needs met. RN administering for medication for pain during session. Therapy Documentation Precautions:  Precautions Precautions: Fall, Back Precaution Comments: Pt is able to report 3/3 back precautions  Required Braces or Orthoses: Spinal Brace Spinal Brace: Lumbar corset, Applied in sitting position Restrictions Weight Bearing Restrictions: No Other Position/Activity Restrictions: At all times   Pain: Pain Assessment Pain Assessment: 0-10 Pain Score: 8  Pain Type: Acute pain Pain Location: Back Pain Orientation: Lower Pain Descriptors / Indicators: Aching Pain Frequency: Intermittent Pain Onset:  With Activity Patients Stated Pain Goal: 3 Pain Intervention(s): Medication (See eMAR)  See Function Navigator for Current Functional Status.   Therapy/Group: Individual Therapy  Tonny Branch 01/29/2017, 10:44 AM

## 2017-01-29 NOTE — Plan of Care (Signed)
Problem: SCI BOWEL ELIMINATION Goal: RH STG MANAGE BOWEL WITH ASSISTANCE STG Manage Bowel with min Assistance.   Outcome: Not Progressing Medication, no bm this shift Goal: RH STG SCI MANAGE BOWEL PROGRAM W/ASSIST OR AS APPROPRIATE STG SCI Manage bowel program with min assist or as appropriate.   Outcome: Not Progressing No bm today, bm 8/15 after enema  Problem: SCI BLADDER ELIMINATION Goal: RH STG MANAGE BLADDER WITH MEDICATION WITH ASSISTANCE STG Manage Bladder With Medication With min Assistance.   Outcome: Not Progressing Requiring medication and I&O caths q 4-5 hours  Problem: RH PAIN MANAGEMENT Goal: RH STG PAIN MANAGED AT OR BELOW PT'S PAIN GOAL Outcome: Progressing Pt has c/o pain with full bladder and requires pain medication approximately once/shift.

## 2017-01-29 NOTE — IPOC Note (Addendum)
Overall Plan of Care [facility]) Patient Details Name: Donald Calderon MRN: [id number] DOB: December 05, 1940  Admitting Diagnosis: Lumbar stenosis  Hospital Problems: Principal Problem:   Lumbar stenosis Active Problems:   Diabetes mellitus type 2 in obese [facility])   Urine retention   Neurologic gait disorder     Functional Problem List: Nursing Bladder, Bowel, Endurance, Medication Management, Pain, Sensory  PT Balance, Endurance, Motor, Pain, Safety  OT Balance, Endurance, Motor, Pain  SLP    TR         Basic ADL's: OT Grooming, Bathing, Dressing, Toileting     Advanced  ADL's: OT       Transfers: PT Bed Mobility, Bed to Chair, Car, Manufacturing systems engineer, Metallurgist: PT Ambulation, Stairs     Additional Impairments: OT    SLP        TR      Anticipated Outcomes Item Anticipated Outcome  Self Feeding independent  Swallowing      Basic self-care  supervision  Toileting  supervision    Bathroom Transfers supervision  Bowel/Bladder  min assist with bowel and bladder managmenet- I&O cath and bowel program  Transfers  Supervision  Locomotion  Supervision  Communication     Cognition     Pain  4 or less  Safety/Judgment  mod assist   Therapy Plan: PT Intensity: Minimum of 1-2 x/day ,45 to 90 minutes PT Frequency: 5 out of 7 days PT Duration Estimated Length of Stay: 16-18 days OT Intensity: Minimum of 1-2 x/day, 45 to 90 minutes OT Frequency: 5 out of 7 days OT Duration/Estimated Length of Stay: 16-20 days       Team Interventions: Nursing Interventions Patient/Family Education, Pain Management, Bladder Management, Bowel Management, Disease Management/Prevention, Skin Care/Wound Management, Medication Management  PT interventions Ambulation/gait training, Community reintegration, DME/adaptive equipment instruction, Neuromuscular re-education, Stair training, UE/LE Strength taining/ROM, Training and development officer, Therapeutic Activities,  UE/LE Coordination activities, Functional mobility training, Patient/family education, Therapeutic Exercise, Discharge planning  OT Interventions Balance/vestibular training, Discharge planning, Pain management, Self Care/advanced ADL retraining, Therapeutic Activities, UE/LE Coordination activities, Disease mangement/prevention, Functional mobility training, Patient/family education, Therapeutic Exercise, Community reintegration, Engineer, drilling, UE/LE Strength taining/ROM, Wheelchair propulsion/positioning  SLP Interventions    TR Interventions    SW/CM Interventions Discharge Planning, Psychosocial Support, Patient/Family Education   Barriers to Discharge MD  Medical stability and Wound care  Nursing Incontinence, Neurogenic Bowel & Bladder    PT Inaccessible home environment    OT      SLP      SW       Team Discharge Planning: Destination: PT-Home ,OT- Home , SLP-  Projected Follow-up: PT-Home health PT, 24 hour supervision/assistance, OT-  Other (comment) (TBD pending Pt progress ), SLP-  Projected Equipment Needs: PT-To be determined, OT- To be determined, SLP-  Equipment Details: PT-Pt has cane and walker at home, OT-  Patient/family involved in discharge planning: PT- Patient,  OT-Patient, SLP-   MD ELOS: 16-18 days Medical Rehab Prognosis:  Excellent Assessment: The patient has been admitted for CIR therapies with the diagnosis of 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 The team will be addressing functional mobility, strength, stamina, balance, safety, adaptive techniques and equipment, self-care, bowel and bladder mgt, patient and caregiver education, pain mgt, back precautions, urine retention, neurogenic bowel, don/doff of LSO. Goals have been set at supervision for mobility and self-care activities. Meredith Staggers, MD, Mellody Drown  See Team Conference Notes for weekly updates to the plan of care

## 2017-01-29 NOTE — Progress Notes (Signed)
Physical Therapy Session Note  Patient Details  Name: Donald Calderon MRN: [id number] Date of Birth: 12/12/1940  Today's Date: 01/29/2017 PT Individual Time: 1100-1200 PT Individual Time Calculation (min): 60 min   Short Term Goals: Week 1:  PT Short Term Goal 1 (Week 1): Pt will perform lying to sit with min assist and min cues  PT Short Term Goal 2 (Week 1): Pt will sit to stand with min assist consistantly through session PT Short Term Goal 3 (Week 1): Pt will ambulate 50 feet with RW and min guard PT Short Term Goal 4 (Week 1): Pt will initiate stair training   Skilled Therapeutic Interventions/Progress Updates: Pt received seated in w/c, c/o pain as below and pre-medicated, otherwise agreeable to treatment. Transported to/from gym totalA for energy conservation. Gait 2x15' with RW and minA; cues for upright posture and reduced reliance on UEs on RW. Step ups x2 on 3" step with B handrails and modA for LE placement on/off step, assist for powering up to step. Limited by pain during activity. Gait 2x20' with RW and min guard; repetitive cues as above. Seated rest breaks between trials d/t fatigue and pain. Performed static standing balance with progression BUE>one UE>no UE; ultimately reverted to one UE for additional trials d/t pain and impaired ankle strategy with no UE support and pt very fearful of activity. Sit <>stand with BUE support on chair back to reduce reliance on UEs. Standing LE hip abduction toe taps to facilitate single leg stance, weight shifting, hip abduction strength. Stand pivot to return to w/c with RW and minA. Remained seated in w/c at end of session, all needs in reach.      Therapy Documentation Precautions:  Precautions Precautions: Fall, Back Precaution Comments: Pt is able to report 3/3 back precautions  Required Braces or Orthoses: Spinal Brace Spinal Brace: Lumbar corset, Applied in sitting position Restrictions Weight Bearing Restrictions: No Other  Position/Activity Restrictions: At all times Pain: Pain Assessment Pain Assessment: 0-10 Pain Score: 8  Pain Type: Acute pain Pain Location: Back Pain Orientation: Lower Pain Descriptors / Indicators: Aching Pain Frequency: Intermittent Pain Onset: With Activity Patients Stated Pain Goal: 3 Pain Intervention(s): Medication (See eMAR)  See Function Navigator for Current Functional Status.   Therapy/Group: Individual Therapy  Luberta Mutter 01/29/2017, 11:37 AM

## 2017-01-29 NOTE — Progress Notes (Signed)
Occupational Therapy Note  Patient Details  Name: Donald Calderon MRN: [id number] Date of Birth: 07-17-1940  Today's Date: 01/29/2017 OT Individual Time: 1300-1330 OT Individual Time Calculation (min): 30 min   Pt denies pain Individual Therapy  Pt resting in w/c upon arrival and agreeable to therapy.  Pt transitioned to ADL apartment and practiced tub bench transfers.  Pt performed stand pivot transfer with RW to tub bench and required steady A to complete transfer.  Pt practiced transfer X 3.  Pt currently own a tub seat and agreed with recommendation for tub bench.  Pt returned to w/c and requested to return to bed.  Pt required mod A for sit>supine in bed (lifting one LE). Pt assisted with repositioning.  Pt remained in bed with all needs within reach.    Leotis Shames [facility] 01/29/2017, 2:58 PM

## 2017-01-29 NOTE — Progress Notes (Signed)
Occupational Therapy Session Note  Patient Details  Name: Donald Calderon MRN: [id number] Date of Birth: [birth year]  Today's Date: 01/29/2017 OT Individual Time: 0800-0900 OT Individual Time Calculation (min): 60 min    Short Term Goals: Week 1:  OT Short Term Goal 1 (Week 1): Pt will complete 2/3 steps for LB dressing using AE.  OT Short Term Goal 2 (Week 1): Pt will transfer to BSC/BSC over toilet with MinA.  OT Short Term Goal 3 (Week 1): Pt will complete sit<>stand with MinA during bathing task.  OT Short Term Goal 4 (Week 1): Pt will demonstrate adherence to back precautions during ADL completion with Min verbal cues.   Skilled Therapeutic Interventions/Progress Updates:    Pt engaged in BADL retraining including bathing at shower level and dressing with sit<>stand from w/c.  Pt amb with RW into bathroom and transferred to tub bench (min A and mod verbal cues for safety). Pt performed sit<>stand from tub bench with steady A.  Pt transferred to w/c and completed dressing tasks.  Pt donned LSO at supervision level.  Pt required min A for sit<>stand from w/c with mod verbal cues for sequencing and BLE positioning.  Pt recalled 3/3 back precautions.  Pt used reacher and sock aide to assist with LB dressing tasks.  Focus on activity tolerance, sit<>stand, standing balance, functional amb with RW, functional transfers, and safety awareness to increase independence with BADLs.   Therapy Documentation Precautions:  Precautions Precautions: Fall, Back Precaution Comments: Pt is able to report 3/3 back precautions  Required Braces or Orthoses: Spinal Brace Spinal Brace: Lumbar corset, Applied in sitting position Restrictions Weight Bearing Restrictions: No Other Position/Activity Restrictions: At all times Pain: Pain Assessment Pain Assessment: No/denies pain Pain Score: 0-No pain  See Function Navigator for Current Functional Status.   Therapy/Group: Individual Therapy  [name] 01/29/2017, 10:01 AM

## 2017-01-29 NOTE — Progress Notes (Signed)
[facility]ICAL [facility]ICINE & REHABILITATION     PROGRESS NOTE    Subjective/Complaints: Having a little more urge to void. Still req I/O cath.    ROS: pt denies nausea, vomiting, diarrhea, cough, shortness of breath or chest pain   Objective: Vital Signs: Blood pressure 125/71, pulse 71, temperature 97.6 F (36.4 C), temperature source Oral, resp. rate 18, height 5\' 9"  (1.753 m), weight 90.4 kg (199 lb 3.2 oz), SpO2 100 %. No results found.  Recent Labs  01/27/17 0528  WBC 8.6  HGB 10.2*  HCT 30.4*  PLT 268    Recent Labs  01/27/17 0528  NA 133*  K 3.5  CL 97*  GLUCOSE 131*  BUN 14  CREATININE 1.10  CALCIUM 8.7*   CBG (last 3)   Recent Labs  01/28/17 1702 01/28/17 2050 01/29/17 0617  GLUCAP 183* 231* 224*    Wt Readings from Last 3 Encounters:  01/28/17 90.4 kg (199 lb 3.2 oz)  01/22/17 94.3 kg (208 lb)  01/15/17 94.5 kg (208 lb 6.4 oz)    Physical Exam:  Constitutional: He is oriented to person, place, and time. He appears well-developedand well-nourished. No distress.  HENT:  Head: Normocephalicand atraumatic.  Mouth/Throat: Oropharynx is clear and moist.  Eyes: Pupils are equal, round, and reactive to light. Conjunctivaeand EOMare normal.  Neck: Normal range of motion. Neck supple.  Cardiovascular: RRR without murmur. No JVD    Respiratory: CTA Bilaterally without wheezes or rales. Normal effort   GI: Soft. Bowel sounds are normal. He exhibits less  distension. There is no tenderness.  Musculoskeletal: He exhibits no edemaor tenderness.  Neurological: He is alertand oriented to person, place, and time.  Speech clear. Able to follow commands without difficulty.  Skin: Skin is warmand dry. He is not diaphoretic.  Honey comb dressing on back, incision remains intact/dry Psychiatric: He has a normal mood and affect. His behavior is normal. Judgmentand thought contentnormal.   Assessment/Plan: 1. Functional deficits secondary to  lumbar stenosis/neurogenic claudication which require 3+ hours per day of interdisciplinary therapy in a comprehensive inpatient rehab setting. Physiatrist is providing close team supervision and 24 hour management of active medical problems listed below. Physiatrist and rehab team continue to assess barriers to discharge/monitor patient progress toward functional and medical goals.  Function:  Bathing Bathing position   Position: Shower  Bathing parts Body parts bathed by patient: Right arm, Left arm, Chest, Abdomen, Front perineal area, Right upper leg, Left upper leg, Right lower leg, Left lower leg (using long handled sponge) Body parts bathed by helper: Back, Buttocks  Bathing assist Assist Level: Touching or steadying assistance(Pt > 75%)      Upper Body Dressing/Undressing Upper body dressing   What is the patient wearing?: Pull over shirt/dress, Orthosis (spinal brace)     Pull over shirt/dress - Perfomed by patient: Thread/unthread right sleeve, Thread/unthread left sleeve, Put head through opening       Orthosis activity level: Performed by helper (50%)  Upper body assist Assist Level: Touching or steadying assistance(Pt > 75%)      Lower Body Dressing/Undressing Lower body dressing   What is the patient wearing?: Underwear, Non-skid slipper socks, Pants Underwear - Performed by patient: Thread/unthread right underwear leg, Thread/unthread left underwear leg Underwear - Performed by helper: Pull underwear up/down Pants- Performed by patient: Thread/unthread right pants leg, Thread/unthread left pants leg Pants- Performed by helper: Pull pants up/down, Fasten/unfasten pants Non-skid slipper socks- Performed by patient: Don/doff right sock, Don/doff left  sock                    Lower body assist Assist for lower body dressing:  (ModA)      Toileting Toileting     Toileting steps completed by helper: Adjust clothing prior to toileting, Performs perineal  hygiene, Adjust clothing after toileting Toileting Assistive Devices: Grab bar or rail  Toileting assist Assist level: Two helpers   Transfers Chair/bed transfer   Chair/bed transfer method: Stand pivot Chair/bed transfer assist level: Touching or steadying assistance (Pt > 75%) Chair/bed transfer assistive device: Armrests, Medical sales representative     Max distance: 25 Assist level: 2 helpers (w/c follow for safety; minA overall)   Wheelchair Wheelchair activity did not occur: Safety/medical concerns (fatigue)        Cognition Comprehension Comprehension assist level: Understands complex 90% of the time/cues 10% of the time  Expression Expression assist level: Expresses basic needs/ideas: With no assist  Social Interaction Social Interaction assist level: Interacts appropriately with others - No medications needed.  Problem Solving Problem solving assist level: Solves complex 90% of the time/cues < 10% of the time  Memory Memory assist level: More than reasonable amount of time   Medical Problem List and Plan: 1. Functional deficitssecondary to lumbar stenosis with neurogenic claudication -continue CIR therapies 2. DVT Prophylaxis/Anticoagulation: Mechanical: Sequential compression devices, below knee Bilateral lower extremities 3. Pain Management: Hydrocodone prn.  4. Mood: LCSW to follow for evaluation and support.  5. Neuropsych: This patient iscapable of making decisions on notown behalf. 6. Skin/Wound Care: Monitor wound daily for healing.  7. Fluids/Electrolytes/Nutrition: Monitor I/O.               -sl hyponatremia 8. HTN: Monitor BP bid with orthostatic vitals. Hold parameters on Norvasc, HCTZ and prinivil.  9. T2DM: reasonable control at present. Continue Levemir bid with SSI for elevated BS--po intake reasonable. Titrate upwards as indicated. 10. BPH s/p TURP: I/O cath to keep bladder decompressed.           - Continue flomax.             -UA neg, ucx neg also           -needs to be OOB to void  -added urecholine to assist emptying---increase to 25mg  11. Abdominal pain/Constipation: no bm since 8/15. Needs to empty today           -KUB with gas, no obstruction           -added simethicone           -regular soft/lax  -sorbitol today 12. Post-op anemia   LOS (Days) 3 A FACE TO FACE EVALUATION WAS PERFORMED  Meredith Staggers, MD 01/29/2017 9:07 AM

## 2017-01-30 ENCOUNTER — Inpatient Hospital Stay (HOSPITAL_COMMUNITY): Payer: Medicare HMO | Admitting: Physical Therapy

## 2017-01-30 DIAGNOSIS — E1169 Type 2 diabetes mellitus with other specified complication: Secondary | ICD-10-CM

## 2017-01-30 DIAGNOSIS — R269 Unspecified abnormalities of gait and mobility: Secondary | ICD-10-CM

## 2017-01-30 DIAGNOSIS — E669 Obesity, unspecified: Secondary | ICD-10-CM

## 2017-01-30 LAB — GLUCOSE, CAPILLARY
Glucose-Capillary: 101 mg/dL — ABNORMAL HIGH (ref 65–99)
Glucose-Capillary: 181 mg/dL — ABNORMAL HIGH (ref 65–99)
Glucose-Capillary: 173 mg/dL — ABNORMAL HIGH (ref 65–99)
Glucose-Capillary: 239 mg/dL — ABNORMAL HIGH (ref 65–99)

## 2017-01-30 NOTE — Progress Notes (Signed)
[facility] PHYSICAL MEDICINE & REHABILITATION     PROGRESS NOTE    Subjective/Complaints: Voided this morning. Had required caths previously. Emptied bowels. Overall feeling better   ROS: pt denies nausea, vomiting, diarrhea, cough, shortness of breath or chest pain   Objective: Vital Signs: Blood pressure 120/67, pulse 68, temperature 99.1 F (37.3 C), temperature source Oral, resp. rate 17, height 5\' 9"  (1.753 m), weight 90.4 kg (199 lb 3.2 oz), SpO2 100 %. No results found. No results for input(s): WBC, HGB, HCT, PLT in the last 72 hours. No results for input(s): NA, K, CL, GLUCOSE, BUN, CREATININE, CALCIUM in the last 72 hours.  Invalid input(s): CO CBG (last 3)   Recent Labs  01/29/17 1643 01/29/17 2059 01/30/17 0631  GLUCAP 163* 157* 101*    Wt Readings from Last 3 Encounters:  01/28/17 90.4 kg (199 lb 3.2 oz)  01/22/17 94.3 kg (208 lb)  01/15/17 94.5 kg (208 lb 6.4 oz)    Physical Exam:  Constitutional: He is oriented to person, place, and time. He appears well-developedand well-nourished. No distress.  HENT:  Head: Normocephalicand atraumatic.  Mouth/Throat: Oropharynx is clear and moist.  Eyes: Pupils are equal, round, and reactive to light. Conjunctivaeand EOMare normal.  Neck: Normal range of motion. Neck supple.  Cardiovascular: RRR without murmur. No JVD     Respiratory: CTA Bilaterally without wheezes or rales. Normal effort    GI: Soft. Bowel sounds are normal. He exhibits less  distension. There is no tenderness.  Musculoskeletal: He exhibits no edemaor tenderness.  Neurological: He is alertand oriented to person, place, and time.  Speech clear. Able to follow commands without difficulty.  Skin: Skin is warmand dry. He is not diaphoretic.  Honey comb dressing intact Psychiatric: He has a normal mood and affect. His behavior is normal. Judgmentand thought contentnormal.   Assessment/Plan: 1. Functional deficits secondary to lumbar  stenosis/neurogenic claudication which require 3+ hours per day of interdisciplinary therapy in a comprehensive inpatient rehab setting. Physiatrist is providing close team supervision and 24 hour management of active medical problems listed below. Physiatrist and rehab team continue to assess barriers to discharge/monitor patient progress toward functional and medical goals.  Function:  Bathing Bathing position   Position: Shower  Bathing parts Body parts bathed by patient: Right arm, Left arm, Chest, Abdomen, Front perineal area, Right upper leg, Left upper leg, Right lower leg, Left lower leg Body parts bathed by helper: Buttocks  Bathing assist Assist Level: Touching or steadying assistance(Pt > 75%)      Upper Body Dressing/Undressing Upper body dressing   What is the patient wearing?: Pull over shirt/dress, Orthosis     Pull over shirt/dress - Perfomed by patient: Thread/unthread right sleeve, Thread/unthread left sleeve, Put head through opening, Pull shirt over trunk       Orthosis activity level: Performed by patient  Upper body assist Assist Level: Supervision or verbal cues      Lower Body Dressing/Undressing Lower body dressing   What is the patient wearing?: Pants, Non-skid slipper socks Underwear - Performed by patient: Thread/unthread right underwear leg, Thread/unthread left underwear leg Underwear - Performed by helper: Pull underwear up/down Pants- Performed by patient: Thread/unthread right pants leg, Thread/unthread left pants leg, Pull pants up/down Pants- Performed by helper: Pull pants up/down, Fasten/unfasten pants Non-skid slipper socks- Performed by patient: Don/doff left sock Non-skid slipper socks- Performed by helper: Don/doff right sock  Lower body assist Assist for lower body dressing: Touching or steadying assistance (Pt > 75%)      Toileting Toileting     Toileting steps completed by helper: Adjust clothing prior to  toileting, Performs perineal hygiene, Adjust clothing after toileting Toileting Assistive Devices: Grab bar or rail  Toileting assist Assist level: Touching or steadying assistance (Pt.75%)   Transfers Chair/bed transfer   Chair/bed transfer method: Stand pivot Chair/bed transfer assist level: Touching or steadying assistance (Pt > 75%) Chair/bed transfer assistive device: Armrests, Medical sales representative     Max distance: 20 Assist level: Touching or steadying assistance (Pt > 75%)   Wheelchair Wheelchair activity did not occur: Safety/medical concerns (fatigue)        Cognition Comprehension Comprehension assist level: Understands basic 90% of the time/cues < 10% of the time  Expression Expression assist level: Expresses basic 90% of the time/requires cueing < 10% of the time.  Social Interaction Social Interaction assist level: Interacts appropriately 90% of the time - Needs monitoring or encouragement for participation or interaction.  Problem Solving Problem solving assist level: Solves basic 90% of the time/requires cueing < 10% of the time  Memory Memory assist level: Recognizes or recalls 90% of the time/requires cueing < 10% of the time   Medical Problem List and Plan: 1. Functional deficitssecondary to lumbar stenosis with neurogenic claudication -continue CIR therapies 2. DVT Prophylaxis/Anticoagulation: Mechanical: Sequential compression devices, below knee Bilateral lower extremities 3. Pain Management: Hydrocodone prn.  4. Mood: LCSW to follow for evaluation and support.  5. Neuropsych: This patient iscapable of making decisions on notown behalf. 6. Skin/Wound Care: Monitor wound daily for healing.  7. Fluids/Electrolytes/Nutrition: Monitor I/O.               -sl hyponatremia---recheck next week 8. HTN: Monitor BP bid with orthostatic vitals. Hold parameters on Norvasc, HCTZ and prinivil.  9. T2DM: reasonable control at present.  Continue Levemir bid with SSI for elevated BS--po intake reasonable. Titrate upwards as indicated. 10. BPH s/p TURP: I/O cath to keep bladder decompressed.           - Continue flomax.            -UA neg, ucx neg also           -needs to be OOB to void  -continue urecholine 25mg  tid---beginning to empty 11. Abdominal pain/Constipation: moved bowels twice yesterday           -KUB with gas, no obstruction           -added simethicone           -regular soft/lax   12. Post-op anemia   LOS (Days) 4 A FACE TO FACE EVALUATION WAS PERFORMED  Meredith Staggers, MD 01/30/2017 9:27 AM

## 2017-01-30 NOTE — Progress Notes (Signed)
Physical Therapy Session Note  Patient Details  Name: Donald Calderon MRN: [id number] Date of Birth: [birth year]  Today's Date: 01/30/2017 PT Individual Time: 1015-1100 PT Individual Time Calculation (min): 45 min   Short Term Goals: Week 1:  PT Short Term Goal 1 (Week 1): Pt will perform lying to sit with min assist and min cues  PT Short Term Goal 2 (Week 1): Pt will sit to stand with min assist consistantly through session PT Short Term Goal 3 (Week 1): Pt will ambulate 50 feet with RW and min guard PT Short Term Goal 4 (Week 1): Pt will initiate stair training   Skilled Therapeutic Interventions/Progress Updates:  Pt was seen bedside in the am. Pt medicated prior to treatment. Pt performed multiple sit to stand transfers with rolling walker, orthosis and min A with verbal cues. Pt performed cone taps and alternating cone taps  3 sets x 5 reps each. Placed 1 lb weight on R ankle for increased proprioceptive feedback. Pt ambulated 25 feet with rolling walker and min A with verbal cues. Pt returned to room following treatment and left sitting up with call bell within reach.   Therapy Documentation Precautions:  Precautions Precautions: Fall, Back Precaution Comments: Pt is able to report 3/3 back precautions  Required Braces or Orthoses: Spinal Brace Spinal Brace: Lumbar corset, Applied in sitting position Restrictions Weight Bearing Restrictions: No Other Position/Activity Restrictions: At all times General:   Pain: Pain Assessment Pain Assessment: Faces Faces Pain Scale: Hurts even more Pain Type: Acute pain;Neuropathic pain Pain Location: Leg Pain Orientation: Right;Left Pain Descriptors / Indicators: Aching;Pins and needles;Tingling Pain Frequency: Intermittent Pain Onset: On-going Pain Intervention(s): Medication (See eMAR)   See Function Navigator for Current Functional Status.   Therapy/Group: Individual Therapy  Dub Amis 01/30/2017, 12:25 PM

## 2017-01-31 ENCOUNTER — Inpatient Hospital Stay (HOSPITAL_COMMUNITY): Payer: Medicare HMO

## 2017-01-31 ENCOUNTER — Inpatient Hospital Stay (HOSPITAL_COMMUNITY): Payer: Medicare HMO | Admitting: Physical Therapy

## 2017-01-31 DIAGNOSIS — K567 Ileus, unspecified: Secondary | ICD-10-CM

## 2017-01-31 DIAGNOSIS — K9189 Other postprocedural complications and disorders of digestive system: Secondary | ICD-10-CM

## 2017-01-31 LAB — GLUCOSE, CAPILLARY
GLUCOSE-CAPILLARY: 104 mg/dL — AB (ref 65–99)
GLUCOSE-CAPILLARY: 102 mg/dL — AB (ref 65–99)
GLUCOSE-CAPILLARY: 151 mg/dL — AB (ref 65–99)
Glucose-Capillary: 205 mg/dL — ABNORMAL HIGH (ref 65–99)

## 2017-01-31 MED ORDER — HYDROCORTISONE ACE-PRAMOXINE 1-1 % RE FOAM
1.0000 | Freq: Two times a day (BID) | RECTAL | Status: DC
  Administered 2017-01-31 – 2017-02-02 (×6): 1 via RECTAL
  Filled 2017-01-31 (×3): qty 10

## 2017-01-31 MED ORDER — METOCLOPRAMIDE HCL 5 MG PO TABS
5.0000 mg | ORAL_TABLET | Freq: Three times a day (TID) | ORAL | Status: DC
  Administered 2017-01-31 – 2017-02-01 (×3): 5 mg via ORAL
  Filled 2017-01-31 (×3): qty 1

## 2017-01-31 MED ORDER — TAMSULOSIN HCL 0.4 MG PO CAPS
0.4000 mg | ORAL_CAPSULE | Freq: Two times a day (BID) | ORAL | Status: DC
  Administered 2017-01-31 – 2017-02-12 (×24): 0.4 mg via ORAL
  Filled 2017-01-31 (×24): qty 1

## 2017-01-31 MED ORDER — GABAPENTIN 100 MG PO CAPS
100.0000 mg | ORAL_CAPSULE | Freq: Three times a day (TID) | ORAL | Status: DC
  Administered 2017-01-31 – 2017-02-12 (×36): 100 mg via ORAL
  Filled 2017-01-31 (×36): qty 1

## 2017-01-31 NOTE — Progress Notes (Signed)
Physical Therapy Session Note  Patient Details  Name: Donald Calderon MRN: [id number] Date of Birth: August 09, 1940  Today's Date: 01/31/2017 PT Individual Time: 0800-0900 PT Individual Time Calculation (min): 60 min   Short Term Goals: Week 1:  PT Short Term Goal 1 (Week 1): Pt will perform lying to sit with min assist and min cues  PT Short Term Goal 2 (Week 1): Pt will sit to stand with min assist consistantly through session PT Short Term Goal 3 (Week 1): Pt will ambulate 50 feet with RW and min guard PT Short Term Goal 4 (Week 1): Pt will initiate stair training   Skilled Therapeutic Interventions/Progress Updates:  Pt was seen bedside in the am. Pt transferred supine to edge of bed with side rails and S with increased time. Pt donned TLSO at edge of bed. Pt transferred sit to stand with rolling walker and min A. Pt transferred edge of bed to w/c with rolling walker and min A. Pt performed multiple sit to stand transfers and stand pivot transfers with rolling walker and min A wit verbal cues. Treatment focused on NMR utilizing cone taps and alternating cone taps 3 sets x 10 reps each with 1 lb weight on R ankle for increased proprioceptive feedback. Pt ambulated 70 feet with rolling walker and min A with verbal cues. Increased scissoring B LEs noted as per fatigued. Pt returned to room. Pt performed toilet transfer with grab bar and min A with verbal cues. Pt left sitting on toilet with call bell within reach and pt's nurse tech aware.   Therapy Documentation Precautions:  Precautions Precautions: Fall, Back Precaution Comments: Pt is able to report 3/3 back precautions  Required Braces or Orthoses: Spinal Brace Spinal Brace: Lumbar corset, Applied in sitting position Restrictions Weight Bearing Restrictions: No Other Position/Activity Restrictions: At all times General:   Vital Signs:  Pain: Pt c/o 6/10 pain B LEs.   See Function Navigator for Current Functional  Status.   Therapy/Group: Individual Therapy  Dub Amis 01/31/2017, 12:11 PM

## 2017-01-31 NOTE — Progress Notes (Signed)
Physical Therapy Session Note  Patient Details  Name: Donald Calderon MRN: [id number] Date of Birth: Jun 27, 1940  Today's Date: 01/31/2017 PT Individual Time: 1345-1430 PT Individual Time Calculation (min): 45 min   Short Term Goals: Week 1:  PT Short Term Goal 1 (Week 1): Pt will perform lying to sit with min assist and min cues  PT Short Term Goal 2 (Week 1): Pt will sit to stand with min assist consistantly through session PT Short Term Goal 3 (Week 1): Pt will ambulate 50 feet with RW and min guard PT Short Term Goal 4 (Week 1): Pt will initiate stair training   Skilled Therapeutic Interventions/Progress Updates:  Pt was seen bedside in the pm. Pt transferred supine to edge of bed with side rail and head of bed elevated with S and increased time. Donned corset at edge of bed. Pt performed multiple sit to stand and stand pivot transfers with rolling walker and min A. Pt propelled w/c 150, 150 and 120 feet with B UEs and S. Pt rode Nustep x 10 mins at level 3. Pt ambulated 70 feet x2 with rolling walker and min A. Pt returned to room and left sitting up in w/c with call bell within reach.   Therapy Documentation Precautions:  Precautions Precautions: Fall, Back Precaution Comments: Pt is able to report 3/3 back precautions  Required Braces or Orthoses: Spinal Brace Spinal Brace: Lumbar corset, Applied in sitting position Restrictions Weight Bearing Restrictions: No Other Position/Activity Restrictions: At all times General:   Pain: No c/o pain.    See Function Navigator for Current Functional Status.   Therapy/Group: Individual Therapy  Dub Amis 01/31/2017, 2:29 PM

## 2017-01-31 NOTE — Progress Notes (Signed)
Occupational Therapy Session Note  Patient Details  Name: Donald Calderon MRN: [id number] Date of Birth: 1940/10/22  Today's Date: 01/31/2017 OT Individual Time: 1000-1055 OT Individual Time Calculation (min): 55 min    Short Term Goals: Week 1:  OT Short Term Goal 1 (Week 1): Pt will complete 2/3 steps for LB dressing using AE.  OT Short Term Goal 2 (Week 1): Pt will transfer to BSC/BSC over toilet with MinA.  OT Short Term Goal 3 (Week 1): Pt will complete sit<>stand with MinA during bathing task.  OT Short Term Goal 4 (Week 1): Pt will demonstrate adherence to back precautions during ADL completion with Min verbal cues.   Skilled Therapeutic Interventions/Progress Updates:    Pt engaged in BADL retraining including bathing at shower level and dressing with sit<>stand from w/c.  Focus on functional amb with RW, sit<>stand, standing balance, activity tolerance, and safety awareness to increase independence with BADLs.  Pt requires min A for amb with RW into and out of bathroom.  Pt uses AE appropriately for LB bathing/dressing tasks.  Pt requires assistance with bathing buttocks and toileting tasks.  Pt follows back precautions without verbal cues.  Pt with external hemorrhoids and RN notified.  Pt returned to bed for transport to xray.   Therapy Documentation Precautions:  Precautions Precautions: Fall, Back Precaution Comments: Pt is able to report 3/3 back precautions  Required Braces or Orthoses: Spinal Brace Spinal Brace: Lumbar corset, Applied in sitting position Restrictions Weight Bearing Restrictions: No Other Position/Activity Restrictions: At all times Pain: Pain Assessment Pain Assessment: 0-10 Pain Score: 5  Pain Type: Acute pain;Neuropathic pain Pain Location: Leg Pain Orientation: Right;Left Pain Descriptors / Indicators: Tingling Pain Frequency: Constant Pain Onset: On-going Pain Intervention(s): RN aware and meds admin prior to therapy and during therapy  See  Function Navigator for Current Functional Status.   Therapy/Group: Individual Therapy  [name] 01/31/2017, 11:02 AM

## 2017-01-31 NOTE — Progress Notes (Signed)
Occupational Therapy Note  Patient Details  Name: Donald Calderon MRN: [id number] Date of Birth: [birth year]  Today's Date: 01/31/2017 OT Individual Time: 1500-1530 OT Individual Time Calculation (min): 30 min   Pt denies pain Individual Therapy  Focus on sit<>stand with emphasis on correct technique.  Pt practiced sit<>stand X 6 during session. Pt requires min verbal cues with steady A for sit<>stand and standing balance with BUE support on RW.  Pt verbalized understanding on technique. Pt remained in w/c with all needs within reach.    Leotis Shames [facility] 01/31/2017, 3:42 PM

## 2017-01-31 NOTE — Progress Notes (Signed)
ABD 1 view done in am. Patient has had  large formed stools since x-ray. Started on reglan today. Reports feeling better in his abdomen.

## 2017-01-31 NOTE — Progress Notes (Signed)
[place] PHYSICAL MEDICINE & REHABILITATION     PROGRESS NOTE    Subjective/Complaints: Still requiring I/O caths. Feels abdomen is distended. Did have BM. Having shooting pain in both legs (had this before)  ROS: pt denies nausea, vomiting, diarrhea, cough, shortness of breath or chest pain     Objective: Vital Signs: Blood pressure (!) 130/57, pulse 66, temperature 98.5 F (36.9 C), temperature source Oral, resp. rate (!) 24, height 5\' 9"  (1.753 m), weight 90.4 kg (199 lb 3.2 oz), SpO2 97 %. No results found. No results for input(s): WBC, HGB, HCT, PLT in the last 72 hours. No results for input(s): NA, K, CL, GLUCOSE, BUN, CREATININE, CALCIUM in the last 72 hours.  Invalid input(s): CO CBG (last 3)   Recent Labs  01/30/17 1731 01/30/17 2156 01/31/17 0624  GLUCAP 173* 239* 104*    Wt Readings from Last 3 Encounters:  01/28/17 90.4 kg (199 lb 3.2 oz)  01/22/17 94.3 kg (208 lb)  01/15/17 94.5 kg (208 lb 6.4 oz)    Physical Exam:  Constitutional: He is oriented to person, place, and time. He appears well-developedand well-nourished. No distress.  HENT:  Head: Normocephalicand atraumatic.  Mouth/Throat: Oropharynx is clear and moist.  Eyes: Pupils are equal, round, and reactive to light. Conjunctivaeand EOMare normal.  Neck: Normal range of motion. Neck supple.  Cardiovascular: RRR without murmur. No JVD      Respiratory: CTA Bilaterally without wheezes or rales. Normal effort     GI: Soft. Bowel sounds are high pitched. He exhibits more distension today. There is no tenderness.  Musculoskeletal: He exhibits no edemaor tenderness.  Neurological: He is alertand oriented to person, place, and time.  Speech clear. Able to follow commands without difficulty.  Decreased LT/PP in distal legs.  Skin: Skin is warmand dry. He is not diaphoretic.  Honey comb dressing intact to back Psychiatric: He has a normal mood and affect. His behavior is normal. Judgmentand  thought contentnormal.   Assessment/Plan: 1. Functional deficits secondary to lumbar stenosis/neurogenic claudication which require 3+ hours per day of interdisciplinary therapy in a comprehensive inpatient rehab setting. Physiatrist is providing close team supervision and 24 hour management of active medical problems listed below. Physiatrist and rehab team continue to assess barriers to discharge/monitor patient progress toward functional and medical goals.  Function:  Bathing Bathing position   Position: Shower  Bathing parts Body parts bathed by patient: Right arm, Left arm, Chest, Abdomen, Front perineal area, Right upper leg, Left upper leg, Right lower leg, Left lower leg Body parts bathed by helper: Buttocks  Bathing assist Assist Level: Touching or steadying assistance(Pt > 75%)      Upper Body Dressing/Undressing Upper body dressing   What is the patient wearing?: Pull over shirt/dress, Orthosis     Pull over shirt/dress - Perfomed by patient: Thread/unthread right sleeve, Thread/unthread left sleeve, Put head through opening, Pull shirt over trunk       Orthosis activity level: Performed by patient  Upper body assist Assist Level: Supervision or verbal cues      Lower Body Dressing/Undressing Lower body dressing   What is the patient wearing?: Pants, Non-skid slipper socks Underwear - Performed by patient: Thread/unthread right underwear leg, Thread/unthread left underwear leg Underwear - Performed by helper: Pull underwear up/down Pants- Performed by patient: Thread/unthread right pants leg, Thread/unthread left pants leg, Pull pants up/down Pants- Performed by helper: Pull pants up/down, Fasten/unfasten pants Non-skid slipper socks- Performed by patient: Don/doff left sock Non-skid slipper  socks- Performed by helper: Don/doff right sock                  Lower body assist Assist for lower body dressing: Touching or steadying assistance (Pt > 75%)       Toileting Toileting     Toileting steps completed by helper: Adjust clothing prior to toileting, Performs perineal hygiene, Adjust clothing after toileting Toileting Assistive Devices: Grab bar or rail  Toileting assist Assist level: Touching or steadying assistance (Pt.75%)   Transfers Chair/bed transfer   Chair/bed transfer method: Stand pivot Chair/bed transfer assist level: Touching or steadying assistance (Pt > 75%) Chair/bed transfer assistive device: Armrests, Medical sales representative     Max distance: 25 Assist level: Touching or steadying assistance (Pt > 75%)   Wheelchair Wheelchair activity did not occur: Safety/medical concerns (fatigue)        Cognition Comprehension Comprehension assist level: Understands basic 90% of the time/cues < 10% of the time  Expression Expression assist level: Expresses basic 90% of the time/requires cueing < 10% of the time.  Social Interaction Social Interaction assist level: Interacts appropriately 90% of the time - Needs monitoring or encouragement for participation or interaction.  Problem Solving Problem solving assist level: Solves basic 90% of the time/requires cueing < 10% of the time  Memory Memory assist level: Recognizes or recalls 90% of the time/requires cueing < 10% of the time   Medical Problem List and Plan: 1. Functional deficitssecondary to lumbar stenosis with neurogenic claudication -continue CIR therapies 2. DVT Prophylaxis/Anticoagulation: Mechanical: Sequential compression devices, below knee Bilateral lower extremities 3. Pain Management: Hydrocodone prn.   -add low dose gabapentin for neuropathic pain 4. Mood: LCSW to follow for evaluation and support.  5. Neuropsych: This patient iscapable of making decisions on notown behalf. 6. Skin/Wound Care: Monitor wound daily for healing.  7. Fluids/Electrolytes/Nutrition: Monitor I/O.               -sl hyponatremia---recheck this week 8.  HTN: Monitor BP bid with orthostatic vitals. Hold parameters on Norvasc, HCTZ and prinivil.  9. T2DM: reasonable control at present. Continue Levemir bid with SSI for elevated BS--po intake reasonable. Titrate upwards as indicated. 10. BPH s/p TURP: I/O cath to keep bladder decompressed.           - Continue flomax--increase to BID           -UA neg, ucx neg also           -needs to be OOB to void  -continue urecholine 25mg  tid---beginning to empty  -decompress abdomen 11. Abdominal pain/Constipation: appears more distended again today           -KUB with gas, no obstruction early in week---repeat KUB today  -start low dose reglan           -prn simethicone           -regular soft/lax   12. Post-op anemia   LOS (Days) 5 A FACE TO FACE EVALUATION WAS PERFORMED  Meredith Staggers, MD 01/31/2017 9:55 AM

## 2017-01-31 NOTE — Plan of Care (Signed)
Problem: SCI BLADDER ELIMINATION Goal: RH STG MANAGE BLADDER WITH MEDICATION WITH ASSISTANCE STG Manage Bladder With Medication With min Assistance.   Outcome: Not Progressing Patient unable to completely empty his bladder Goal: RH STG MANAGE BLADDER WITH EQUIPMENT WITH ASSISTANCE STG Manage Bladder With Equipment With min Assistance   Outcome: Not Progressing I&O cath

## 2017-02-01 ENCOUNTER — Inpatient Hospital Stay (HOSPITAL_COMMUNITY): Payer: Medicare HMO | Admitting: Physical Therapy

## 2017-02-01 ENCOUNTER — Inpatient Hospital Stay (HOSPITAL_COMMUNITY): Payer: Medicare HMO

## 2017-02-01 LAB — GLUCOSE, CAPILLARY
GLUCOSE-CAPILLARY: 132 mg/dL — AB (ref 65–99)
GLUCOSE-CAPILLARY: 141 mg/dL — AB (ref 65–99)
Glucose-Capillary: 104 mg/dL — ABNORMAL HIGH (ref 65–99)
Glucose-Capillary: 162 mg/dL — ABNORMAL HIGH (ref 65–99)

## 2017-02-01 MED ORDER — METOCLOPRAMIDE HCL 10 MG PO TABS
10.0000 mg | ORAL_TABLET | Freq: Three times a day (TID) | ORAL | Status: DC
  Administered 2017-02-01 – 2017-02-02 (×3): 10 mg via ORAL
  Filled 2017-02-01 (×3): qty 1

## 2017-02-01 NOTE — Progress Notes (Signed)
Physical Therapy Session Note  Patient Details  Name: Donald Calderon MRN: [id number] Date of Birth: May 25, 1941  Today's Date: 02/01/2017 PT Individual Time: 1305-1330 PT Individual Time Calculation (min): 25 min   Short Term Goals: Week 1:  PT Short Term Goal 1 (Week 1): Pt will perform lying to sit with min assist and min cues  PT Short Term Goal 2 (Week 1): Pt will sit to stand with min assist consistantly through session PT Short Term Goal 3 (Week 1): Pt will ambulate 50 feet with RW and min guard PT Short Term Goal 4 (Week 1): Pt will initiate stair training   Skilled Therapeutic Interventions/Progress Updates:    Handoff from NT finishing in the bathroom. Pt self propelled w/c on unit for functional strengthening and endurance. While engaging in transfer to Nustep, pt with + flatulent with need for BM again. Returned to room via w/c total assist for time management. Min assist for sit <> stands and transfer on/off toilet with cues for technique, adherence to precautions, and foot placement. Pt managing clothing with min assist for balance (total assist for hygiene) and 1 UE support on grab bar. Small + BM in brief before able to transfer to toilet with another small + BM on toilet.  Pt missed 5 min due to BM attempt.   Therapy Documentation Precautions:  Precautions Precautions: Fall, Back Precaution Comments: Pt is able to report 3/3 back precautions  Required Braces or Orthoses: Spinal Brace Spinal Brace: Lumbar corset, Applied in sitting position Restrictions Weight Bearing Restrictions: No Other Position/Activity Restrictions: At all times   Pain:  No complaints of pain.    See Function Navigator for Current Functional Status.   Therapy/Group: Individual Therapy  Canary Brim Ivory Broad, PT, DPT  02/01/2017, 2:48 PM

## 2017-02-01 NOTE — Plan of Care (Signed)
Problem: SCI BLADDER ELIMINATION Goal: RH STG MANAGE BLADDER WITH EQUIPMENT WITH ASSISTANCE STG Manage Bladder With Equipment With min Assistance   Outcome: Not Progressing Unable to urinate

## 2017-02-01 NOTE — Progress Notes (Signed)
Physical Therapy Session Note  Patient Details  Name: Donald Calderon MRN: [id number] Date of Birth: 1940-09-07  Today's Date: 02/01/2017 PT Individual Time: 1445-1530 PT Individual Time Calculation (min): 45 min   Short Term Goals: Week 1:  PT Short Term Goal 1 (Week 1): Pt will perform lying to sit with min assist and min cues  PT Short Term Goal 2 (Week 1): Pt will sit to stand with min assist consistantly through session PT Short Term Goal 3 (Week 1): Pt will ambulate 50 feet with RW and min guard PT Short Term Goal 4 (Week 1): Pt will initiate stair training   Skilled Therapeutic Interventions/Progress Updates:    Pt received sitting up in wheelchair, agreeable to therapy. Pt sit<>stand with min assist throughout session. Pt performed standing balance activities on foam pad with 1 UE for support and 1 UE to complete pipe tree and card matching. Pt able to maintain stance 2-3 min each time. Pt performed standing hip abduction exercises. 2 sets, 20 reps. Pt ambulated 100 ft back toward room with RW and min guard. Cues for widened BOS and upright posture. Pt returned to room total assist. Left sitting up in chair, all needs in reach, NT present. Pt limited throughout session by fatigue and pain.  Therapy Documentation Precautions:  Precautions Precautions: Fall, Back Precaution Comments: Pt is able to report 3/3 back precautions  Required Braces or Orthoses: Spinal Brace Spinal Brace: Lumbar corset, Applied in sitting position Restrictions Weight Bearing Restrictions: No Other Position/Activity Restrictions: At all times   See Function Navigator for Current Functional Status.   Therapy/Group: Individual Therapy  Gwinda Passe, SPT 02/01/2017, 3:07 PM

## 2017-02-01 NOTE — Progress Notes (Signed)
Patient voiding but continues to "dribble" urine at times when standing. PVRs below 200cc throughout day. Patient noted with large bleeding hemorrhoids.Washed rectum with warm soap and water with some relief. Scheduled proctofoam applied with little relief. Continue with plan of care.  Mliss Sax

## 2017-02-01 NOTE — Progress Notes (Signed)
Occupational Therapy Session Note  Patient Details  Name: Donald Calderon MRN: [id number] Date of Birth: Apr 05, 1941  Today's Date: 02/01/2017 OT Individual Time: 0900-1000 OT Individual Time Calculation (min): 60 min    Short Term Goals: Week 1:  OT Short Term Goal 1 (Week 1): Pt will complete 2/3 steps for LB dressing using AE.  OT Short Term Goal 2 (Week 1): Pt will transfer to BSC/BSC over toilet with MinA.  OT Short Term Goal 3 (Week 1): Pt will complete sit<>stand with MinA during bathing task.  OT Short Term Goal 4 (Week 1): Pt will demonstrate adherence to back precautions during ADL completion with Min verbal cues.   Skilled Therapeutic Interventions/Progress Updates:    Focus on ongoing BADL retraining including toileting, bathing at shower level, and dressing with sit<>stand from w/c at sink.  Pt requested to use toilet prior to shower and amb with RW to bathroom.  Pt incontinent of bowel when he stood to amb into bathroom.  Pt completed bathing tasks at supervision level.  Pt required assistance with donning/fastening shoes without AE.  Pt remained in w/c with all needs within reach.  Therapy Documentation Precautions:  Precautions Precautions: Fall, Back Precaution Comments: Pt is able to report 3/3 back precautions  Required Braces or Orthoses: Spinal Brace Spinal Brace: Lumbar corset, Applied in sitting position Restrictions Weight Bearing Restrictions: No Other Position/Activity Restrictions: At all times Pain: Pt c/o abdominal discomfort; RN aware See Function Navigator for Current Functional Status.   Therapy/Group: Individual Therapy  [name] 02/01/2017, 10:04 AM

## 2017-02-01 NOTE — Progress Notes (Signed)
Physical Therapy Session Note  Patient Details  Name: Donald Calderon MRN: [id number] Date of Birth: [birth year]  Today's Date: 02/01/2017 PT Individual Time: 1000-1100 PT Individual Time Calculation (min): 60 min   Short Term Goals: Week 1:  PT Short Term Goal 1 (Week 1): Pt will perform lying to sit with min assist and min cues  PT Short Term Goal 2 (Week 1): Pt will sit to stand with min assist consistantly through session PT Short Term Goal 3 (Week 1): Pt will ambulate 50 feet with RW and min guard PT Short Term Goal 4 (Week 1): Pt will initiate stair training   Skilled Therapeutic Interventions/Progress Updates: Pt received seated in w/c, denies pain at rest but does c/o "twinge" in his back when ambulating and performing other mobility tasks throughout session. Gait with RW and min guard x75'. Stairs ascent/descent 3" steps with B handrails and min A with assist for foot placement and cues to reducing twisting during foot progression. Lateral step ups x5 reps BLE with BUE support on 3" step and min/modA for focus on hip abduction/extension strengthening; requires tactile cueing for knee flexion during eccentric lowering d/t tendency to keep knee locked out, and flex at hips to reach opposite LE to floor. Cues for breathing throughout activity d/t observed straining and holding breath. Standing heel/toe raises with BUE on parallel bars, poor ROM during activity d/t severe strength/coordination deficits. Seated heel/toe raises with level 2 theraband resistance 2x15 reps BLE. Gait x91', and x75' with RW and min guard; limited d/t pain/fatigue. Ongoing cueing for step length, increasing BOS to reduce scissoring. Remained seated in w/c at end of session, all needs in reach.      Therapy Documentation Precautions:  Precautions Precautions: Fall, Back Precaution Comments: Pt is able to report 3/3 back precautions  Required Braces or Orthoses: Spinal Brace Spinal Brace: Lumbar corset, Applied in  sitting position Restrictions Weight Bearing Restrictions: No Other Position/Activity Restrictions: At all times   See Function Navigator for Current Functional Status.   Therapy/Group: Individual Therapy  Luberta Mutter 02/01/2017, 8:49 AM

## 2017-02-01 NOTE — Progress Notes (Signed)
[facility]     PROGRESS NOTE    Subjective/Complaints: Pt still requiring I/O caths. Passing gas,  Had multiple bm's yesterday  ROS: pt denies nausea, vomiting, diarrhea, cough, shortness of breath or chest pain     Objective: Vital Signs: Blood pressure 130/60, pulse 65, temperature 98.7 F (37.1 C), temperature source Oral, resp. rate 18, height 5\' 9"  (1.753 m), weight 90.4 kg (199 lb 3.2 oz), SpO2 97 %. Dg Abd 1 View  Result Date: 01/31/2017 CLINICAL DATA:  Ileus.  Inpatient.  Recent lumbar spine surgery. EXAM: ABDOMEN - 1 VIEW COMPARISON:  01/26/2017 abdominal radiograph. FINDINGS: Posterior spinal fusion hardware overlies the lower lumbar spine. Mild gaseous distention of small bowel loops throughout the abdomen up to 3.2 cm diameter, not appreciably changed. Minimal colonic stool. No evidence of pneumatosis or pneumoperitoneum. No radiopaque nephrolithiasis. Clear lung bases. IMPRESSION: No appreciable change in mild diffuse gaseous distention of the small bowel, compatible with the provided history of adynamic ileus. Electronically Signed   By: Ilona Sorrel M.D.   On: 01/31/2017 12:29   No results for input(s): WBC, HGB, HCT, PLT in the last 72 hours. No results for input(s): NA, K, CL, GLUCOSE, BUN, CREATININE, CALCIUM in the last 72 hours.  Invalid input(s): CO CBG (last 3)   Recent Labs  01/31/17 1657 01/31/17 2146 02/01/17 0646  GLUCAP 151* 205* 104*    Wt Readings from Last 3 Encounters:  01/28/17 90.4 kg (199 lb 3.2 oz)  01/22/17 94.3 kg (208 lb)  01/15/17 94.5 kg (208 lb 6.4 oz)    Physical Exam:  Constitutional: He is oriented to person, place, and time. He appears well-developedand well-nourished. No distress.  HENT:  Head: Normocephalicand atraumatic.  Mouth/Throat: Oropharynx is clear and moist.  Eyes: Pupils are equal, round, and reactive to light. Conjunctivaeand EOMare normal.  Neck: Normal range of motion. Neck  supple.  Cardiovascular: RRR without murmur. No JVD      Respiratory: CTA Bilaterally without wheezes or rales. Normal effort      GI: less distended. Non-tender. bs more regular, less hi pitched Musculoskeletal: He exhibits no edemaor tenderness.  Neurological: He is alertand oriented to person, place, and time.  Speech clear. Able to follow commands without difficulty.  Decreased LT/PP in distal legs.  Skin: Skin is warmand dry. He is not diaphoretic.  Honey comb dressing intact to back without discharge Psychiatric: He has a normal mood and affect. His behavior is normal. Judgmentand thought contentnormal.   Assessment/Plan: 1. Functional deficits secondary to lumbar stenosis/neurogenic claudication which require 3+ hours per day of interdisciplinary therapy in a comprehensive inpatient rehab setting. Physiatrist is providing close team supervision and 24 hour management of active medical problems listed below. Physiatrist and rehab team continue to assess barriers to discharge/monitor patient progress toward functional and medical goals.  Function:  Bathing Bathing position   Position: Shower  Bathing parts Body parts bathed by patient: Right arm, Left arm, Chest, Abdomen, Front perineal area, Right upper leg, Left upper leg, Right lower leg, Left lower leg, Buttocks Body parts bathed by helper: Buttocks  Bathing assist Assist Level: Touching or steadying assistance(Pt > 75%)      Upper Body Dressing/Undressing Upper body dressing   What is the patient wearing?: Pull over shirt/dress, Orthosis     Pull over shirt/dress - Perfomed by patient: Thread/unthread right sleeve, Thread/unthread left sleeve, Put head through opening, Pull shirt over trunk       Orthosis  activity level: Performed by patient  Upper body assist Assist Level: Supervision or verbal cues      Lower Body Dressing/Undressing Lower body dressing   What is the patient wearing?: Underwear, Pants,  Shoes Underwear - Performed by patient: Thread/unthread right underwear leg, Thread/unthread left underwear leg, Pull underwear up/down Underwear - Performed by helper: Pull underwear up/down Pants- Performed by patient: Thread/unthread right pants leg, Thread/unthread left pants leg, Pull pants up/down Pants- Performed by helper: Pull pants up/down, Fasten/unfasten pants Non-skid slipper socks- Performed by patient: Don/doff left sock Non-skid slipper socks- Performed by helper: Don/doff right sock Socks - Performed by patient: Don/doff right sock, Don/doff left sock   Shoes - Performed by patient: Don/doff left shoe Shoes - Performed by helper: Don/doff right shoe          Lower body assist Assist for lower body dressing: Touching or steadying assistance (Pt > 75%)      Toileting Toileting   Toileting steps completed by patient: Adjust clothing prior to toileting, Adjust clothing after toileting, Performs perineal hygiene Toileting steps completed by helper: Adjust clothing prior to toileting, Performs perineal hygiene, Adjust clothing after toileting Toileting Assistive Devices: Grab bar or rail  Toileting assist Assist level: Touching or steadying assistance (Pt.75%)   Transfers Chair/bed transfer   Chair/bed transfer method: Stand pivot Chair/bed transfer assist level: Touching or steadying assistance (Pt > 75%) Chair/bed transfer assistive device: Armrests, Medical sales representative     Max distance: 70 Assist level: Touching or steadying assistance (Pt > 75%)   Wheelchair Wheelchair activity did not occur: Safety/medical concerns (fatigue) Type: Manual Max wheelchair distance: 150 Assist Level: Supervision or verbal cues  Cognition Comprehension Comprehension assist level: Understands basic 90% of the time/cues < 10% of the time  Expression Expression assist level: Expresses basic 90% of the time/requires cueing < 10% of the time.  Social Interaction Social  Interaction assist level: Interacts appropriately with others - No medications needed.  Problem Solving Problem solving assist level: Solves basic 90% of the time/requires cueing < 10% of the time  Memory Memory assist level: Recognizes or recalls 90% of the time/requires cueing < 10% of the time   Medical Problem List and Plan:  1. Functional deficitssecondary to lumbar stenosis with neurogenic claudication -continue CIR therapies 2. DVT Prophylaxis/Anticoagulation: Mechanical: Sequential compression devices, below knee Bilateral lower extremities 3. Pain Management: Hydrocodone prn.   -added low dose gabapentin for neuropathic pain 4. Mood: LCSW to follow for evaluation and support.  5. Neuropsych: This patient iscapable of making decisions on notown behalf. 6. Skin/Wound Care: Monitor wound daily for healing.  7. Fluids/Electrolytes/Nutrition: Monitor I/O.               -sl hyponatremia---recheck this week 8. HTN: Monitor BP bid with orthostatic vitals. Hold parameters on Norvasc, HCTZ and prinivil.  9. T2DM: reasonable control at present. Continue Levemir bid with SSI for elevated BS--po intake reasonable. Titrate upwards as indicated. 10. BPH s/p TURP: I/O cath to keep bladder decompressed.           - Continue flomax--increased to BID           -UA neg, ucx neg also           -needs to be OOB to void  -continue urecholine 25mg  tid---beginning to empty  -continue to decompress abdomen 11. Abdominal pain/Constipation: appears more distended again today           -KUB ileus. Looks/sounds  better today  -increase reglan to 10mg  tid ac           -prn simethicone           -regular soft/lax   12. Post-op anemia   LOS (Days) 6 A FACE TO FACE EVALUATION WAS PERFORMED  Alger Simons T, MD 02/01/2017 10:14 AM

## 2017-02-02 ENCOUNTER — Inpatient Hospital Stay (HOSPITAL_COMMUNITY): Payer: Medicare HMO

## 2017-02-02 ENCOUNTER — Inpatient Hospital Stay (HOSPITAL_COMMUNITY): Payer: Medicare HMO | Admitting: Physical Therapy

## 2017-02-02 LAB — GLUCOSE, CAPILLARY
GLUCOSE-CAPILLARY: 188 mg/dL — AB (ref 65–99)
GLUCOSE-CAPILLARY: 160 mg/dL — AB (ref 65–99)
Glucose-Capillary: 97 mg/dL (ref 65–99)
Glucose-Capillary: 187 mg/dL — ABNORMAL HIGH (ref 65–99)

## 2017-02-02 LAB — BASIC METABOLIC PANEL
Anion gap: 5 (ref 5–15)
BUN: 12 mg/dL (ref 6–20)
CO2: 29 mmol/L (ref 22–32)
CREATININE: 1.08 mg/dL (ref 0.61–1.24)
Calcium: 8.4 mg/dL — ABNORMAL LOW (ref 8.9–10.3)
Chloride: 93 mmol/L — ABNORMAL LOW (ref 101–111)
GFR calc Af Amer: >60 mL/min (ref >60)
Glucose, Bld: 112 mg/dL — ABNORMAL HIGH (ref 65–99)
Potassium: 3.8 mmol/L (ref 3.5–5.1)
SODIUM: 127 mmol/L — AB (ref 135–145)

## 2017-02-02 LAB — CBC
HCT: 28.4 % — ABNORMAL LOW (ref 39.0–52.0)
Hemoglobin: 9.4 g/dL — ABNORMAL LOW (ref 13.0–17.0)
MCH: 28.9 pg (ref 26.0–34.0)
MCHC: 33.1 g/dL (ref 30.0–36.0)
MCV: 87.4 fL (ref 78.0–100.0)
PLATELETS: 301 10*3/uL (ref 150–400)
RBC: 3.25 MIL/uL — AB (ref 4.22–5.81)
RDW: 12.4 % (ref 11.5–15.5)
WBC: 8.4 10*3/uL (ref 4.0–10.5)

## 2017-02-02 NOTE — Progress Notes (Signed)
Physical Therapy Session Note  Patient Details  Name: Donald Calderon MRN: [id number] Date of Birth: 1941/05/06  Today's Date: 02/02/2017 PT Individual Time: 0801-0915 PT Individual Time Calculation (min): 74 min   Short Term Goals: Week 1:  PT Short Term Goal 1 (Week 1): Pt will perform lying to sit with min assist and min cues  PT Short Term Goal 2 (Week 1): Pt will sit to stand with min assist consistantly through session PT Short Term Goal 3 (Week 1): Pt will ambulate 50 feet with RW and min guard PT Short Term Goal 4 (Week 1): Pt will initiate stair training   Skilled Therapeutic Interventions/Progress Updates:    Pt seated in w/c upon PT arrival finishing breakfast, agreeable to therapy tx. Pt seated in chair donned lumbar back brace. Pt propelled w/c x 150 ft to the gym using B UEs. Pt reports pain 4/10 in low back, RN aware and pt just received pain meds prior to therapy.  Worked on LE strengthening, pt performed: seated marches 2 x 10, seated LAQ 2 x 10, seated hip abduction with level 1 TB, standing marches using RW 2 x 10, standing hip abduction using RW 2 x 10 (greated difficulty with R hip abduction) Pt reported needing to have a BM, transported back to room Total A. Pt transferred from w/c to toilet, stand pivot using RW and verbal cues for technique to stand. Pt maintaining dynamic standing balance with single UE to doff/don pants. PT requiring total A for peri area cleaning. Pt performed sit to stands throughout toileting session x 3 with verbal cues for technique, using RW and min assist.  Pt transferred from toilet back to w/c, stand pivot using RW and min assist. Pt ambulated x 55 ft out of room and back using RW and min assist, verbal cues for widened BOS and step length. Pt left seated in w/c at end of session with needs in reach.   Therapy Documentation Precautions:  Precautions Precautions: Fall, Back Precaution Comments: Pt is able to report 3/3 back precautions   Required Braces or Orthoses: Spinal Brace Spinal Brace: Lumbar corset, Applied in sitting position Restrictions Weight Bearing Restrictions: No Other Position/Activity Restrictions: At all times   See Function Navigator for Current Functional Status.   Therapy/Group: Individual Therapy  Netta Corrigan, PT, DPT 02/02/2017, 8:10 AM

## 2017-02-02 NOTE — Evaluation (Signed)
Recreational Therapy Assessment and Plan  Patient Details  Name: Donald Calderon MRN: [id number] Date of Birth: 05/30/1941 Today's Date: 02/02/2017  Rehab Potential: Good ELOS: discharge 8/31   Assessment  Problem List:      Patient Active Problem List   Diagnosis Date Noted  . Lumbar stenosis 01/26/2017  . Diabetes mellitus type 2 in obese ([place])   . Benign essential HTN   . ETOH abuse   . Chronic pain syndrome   . Hyponatremia   . Leukocytosis   . Acute blood loss anemia   . Epidural hematoma ([place]) 01/23/2017  . Status post lumbar surgery 01/21/2017  . Status post lumbar spine operation 01/21/2017  . Back pain, acute 09/27/2011  . NEUROPATHY 08/08/2010  . SWELLING MASS OR LUMP IN HEAD AND NECK 06/27/2010  . BENIGN PROSTATIC HYPERTROPHY, WITH URINARY OBSTRUCTION 06/19/2010  . PERIPHERAL NEUROPATHY 05/12/2010  . HYPOKALEMIA 03/18/2010  . 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 DIABETES MELLITUS, TYPE II 02/07/2010  . HYPERLIPIDEMIA 02/07/2010  . ALCOHOL ABUSE 02/07/2010  . HYPERTENSION 02/07/2010  . SPINAL STENOSIS, LUMBAR 02/07/2010    Past Medical History:      Past Medical History:  Diagnosis Date  . ALCOHOL ABUSE   . Anxiety   . Arthritis   . Depression   . Diabetes mellitus   . GERD (gastroesophageal reflux disease)   . Hypertension   . Pneumonia    Past Surgical History:       Past Surgical History:  Procedure Laterality Date  . BACK SURGERY      disc fro picched nerve times 2  . EYE SURGERY Right    Gaucoma  . LUMBAR LAMINECTOMY/DECOMPRESSION MICRODISCECTOMY N/A 01/21/2017   Procedure: Decompression Lumbar 2-3;  Surgeon: Melina Schools, MD;  Location: [facility];  Service: Orthopedics;  Laterality: N/A;  210 mins  . LUMBAR LAMINECTOMY/DECOMPRESSION MICRODISCECTOMY N/A 01/23/2017   Procedure: EXPLORATION AND EVACUATION OF HEMATOMA L2-L3;  Surgeon: Melina Schools, MD;  Location: [facility];  Service: Orthopedics;  Laterality: N/A;  . PROSTATE SURGERY       Assessment & Plan Clinical Impression: Patient is a 76 y.o. year old male with history of T2DM, HTN, alcohol abuse, chronic back pain due to lumbar stenosis with RLE radiculopathy and neurogenic claudication who was admitted on 01/21/17 for exploration of L3/4 and lumbar decompression L2/L3 by Dr. Rolena Infante. Post op course complicated by increase in leg pain with difficulty walking on 8/11. MRI lumbar spin reviewed, showing L2 hematoma with mass effect. Per report, fluid collection at L2 laminectomy site with severe central canal stenosis and mass effect felt to be due to hematoma. He was taken to OR that day for evacuation of hematoma and treated with decadron X 2 days. He has had issues with BLE weakness with ataxia, urinary retention due to neurogenic bladder and incontinence of bowel. Has been requiring I/O caths due to retention. Back drain removed today. Therapy ongoing and patient with significant decline in mobility as well as ability to carry out ADL tasks. CIR recommended.  Patient transferred to CIR on 01/26/2017 .    Pt presents with decreased activity tolerance, decreased functional mobility, decreased balance, difficulty maintaining precautions Limiting pt's independence with leisure/community pursuits.   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) Expression Interests: Music (Comment) Other Leisure Interests: Television Leisure Participation Style: With Family/Friends;Alone Awareness of Community Resources: Good-identify 3 post discharge leisure resources Psychosocial / Spiritual  Stress Management: Good Methods of Stress Management: work in the shop Social interaction - Mood/Behavior: [place] Appropriate for Education?: Yes Patient Agreeable to [employer]?: Yes Recreational Therapy  Orientation Orientation -Reviewed with patient: Available activity resources Strengths/Weaknesses Patient Strengths/Abilities: Willingness to participate Patient weaknesses: Physical limitations TR Patient demonstrates impairments in the following area(s): Edema;Endurance;Motor;Pain;Safety  Plan Rec Therapy Plan Is patient appropriate for Therapeutic Recreation?: Yes Rehab Potential: Good Treatment times per week: Min 1 TR group/session >25 minutes duirng LOS Estimated Length of Stay: discharge 8/31 TR Treatment/Interventions: Adaptive equipment instruction;1:1 session;Balance/vestibular training;Functional mobility training;Community reintegration;Group participation (Comment);Patient/family education;Therapeutic activities;Recreation/leisure participation;Therapeutic exercise;UE/LE Coordination activities  Recommendations for other services: None   Discharge Criteria: Patient will be discharged from TR if patient refuses treatment 3 consecutive times without medical reason.  If treatment goals not met, if there is a change in medical status, if patient makes no progress towards goals or if patient is discharged from hospital.  The above assessment, treatment plan, treatment alternatives and goals were discussed and mutually agreed upon: by patient  [place] 02/02/2017, 4:01 PM

## 2017-02-02 NOTE — Progress Notes (Signed)
Late entry. Patient given sitz bath this pm 1830. Patient reports mild relief. Hemorrhoids noted bleeding. Continue proctofoam as scheduled. Continue with plan of care.  Donald Calderon

## 2017-02-02 NOTE — Progress Notes (Signed)
[facility]     PROGRESS NOTE    Subjective/Complaints: Pt beginning to void more on his own. Required one cath. Concerned that bowels are loose/frequent now especially when he gets up to go to therapy.  ROS: pt denies nausea, vomiting, diarrhea, cough, shortness of breath or chest pain    Objective: Vital Signs: Blood pressure 130/80, pulse 95, temperature 98.1 F (36.7 C), temperature source Oral, resp. rate 17, height 5\' 9"  (1.753 m), weight 90.4 kg (199 lb 3.2 oz), SpO2 100 %. Dg Abd 1 View  Result Date: 01/31/2017 CLINICAL DATA:  Ileus.  Inpatient.  Recent lumbar spine surgery. EXAM: ABDOMEN - 1 VIEW COMPARISON:  01/26/2017 abdominal radiograph. FINDINGS: Posterior spinal fusion hardware overlies the lower lumbar spine. Mild gaseous distention of small bowel loops throughout the abdomen up to 3.2 cm diameter, not appreciably changed. Minimal colonic stool. No evidence of pneumatosis or pneumoperitoneum. No radiopaque nephrolithiasis. Clear lung bases. IMPRESSION: No appreciable change in mild diffuse gaseous distention of the small bowel, compatible with the provided history of adynamic ileus. Electronically Signed   By: Ilona Sorrel M.D.   On: 01/31/2017 12:29    Recent Labs  02/02/17 0508  WBC 8.4  HGB 9.4*  HCT 28.4*  PLT 301    Recent Labs  02/02/17 0508  NA 127*  K 3.8  CL 93*  GLUCOSE 112*  BUN 12  CREATININE 1.08  CALCIUM 8.4*   CBG (last 3)   Recent Labs  02/01/17 1644 02/01/17 2131 02/02/17 0644  GLUCAP 141* 162* 97    Wt Readings from Last 3 Encounters:  01/28/17 90.4 kg (199 lb 3.2 oz)  01/22/17 94.3 kg (208 lb)  01/15/17 94.5 kg (208 lb 6.4 oz)    Physical Exam:  Constitutional: He is oriented to person, place, and time. He appears well-developedand well-nourished. No distress.  HENT:  Head: Normocephalicand atraumatic.  Mouth/Throat: Oropharynx is clear and moist.  Eyes: Pupils are equal, round, and  reactive to light. Conjunctivaeand EOMare normal.  Neck: Normal range of motion. Neck supple.  Cardiovascular: RRR without murmur. No JVD       Respiratory: CTA Bilaterally without wheezes or rales. Normal effort       GI: nontender non-distended Musculoskeletal: He exhibits no edemaor tenderness.  Neurological: He is alertand oriented to person, place, and time.  Speech clear. Able to follow commands without difficulty.  Decreased LT/PP in distal legs.  Skin: Skin is warmand dry. He is not diaphoretic.  Back wound clean and dry Psychiatric: He has a normal mood and affect. His behavior is normal. Judgmentand thought contentnormal.   Assessment/Plan: 1. Functional deficits secondary to lumbar stenosis/neurogenic claudication which require 3+ hours per day of interdisciplinary therapy in a comprehensive inpatient rehab setting. Physiatrist is providing close team supervision and 24 hour management of active medical problems listed below. Physiatrist and rehab team continue to assess barriers to discharge/monitor patient progress toward functional and medical goals.  Function:  Bathing Bathing position   Position: Shower  Bathing parts Body parts bathed by patient: Right arm, Left arm, Chest, Abdomen, Front perineal area, Right upper leg, Left upper leg, Right lower leg, Left lower leg, Buttocks Body parts bathed by helper: Buttocks  Bathing assist Assist Level: Touching or steadying assistance(Pt > 75%)      Upper Body Dressing/Undressing Upper body dressing   What is the patient wearing?: Pull over shirt/dress, Orthosis     Pull over shirt/dress - Perfomed by patient:  Thread/unthread right sleeve, Thread/unthread left sleeve, Put head through opening, Pull shirt over trunk       Orthosis activity level: Performed by patient  Upper body assist Assist Level: Supervision or verbal cues      Lower Body Dressing/Undressing Lower body dressing   What is the patient  wearing?: Underwear, Pants, Shoes Underwear - Performed by patient: Thread/unthread right underwear leg, Thread/unthread left underwear leg, Pull underwear up/down Underwear - Performed by helper: Pull underwear up/down Pants- Performed by patient: Thread/unthread right pants leg, Thread/unthread left pants leg, Pull pants up/down Pants- Performed by helper: Pull pants up/down, Fasten/unfasten pants Non-skid slipper socks- Performed by patient: Don/doff left sock Non-skid slipper socks- Performed by helper: Don/doff right sock Socks - Performed by patient: Don/doff right sock, Don/doff left sock   Shoes - Performed by patient: Don/doff left shoe Shoes - Performed by helper: Don/doff right shoe          Lower body assist Assist for lower body dressing: Touching or steadying assistance (Pt > 75%)      Toileting Toileting   Toileting steps completed by patient: Adjust clothing prior to toileting, Adjust clothing after toileting, Performs perineal hygiene Toileting steps completed by helper: Adjust clothing prior to toileting, Performs perineal hygiene, Adjust clothing after toileting Toileting Assistive Devices: Grab bar or rail  Toileting assist Assist level: Touching or steadying assistance (Pt.75%)   Transfers Chair/bed transfer   Chair/bed transfer method: Stand pivot Chair/bed transfer assist level: Touching or steadying assistance (Pt > 75%) Chair/bed transfer assistive device: Armrests, Environmental consultant, Orthosis     Locomotion Ambulation     Max distance: 83 ft Assist level: Touching or steadying assistance (Pt > 75%)   Wheelchair Wheelchair activity did not occur: Safety/medical concerns (fatigue) Type: Manual Max wheelchair distance: 150 Assist Level: Supervision or verbal cues  Cognition Comprehension Comprehension assist level: Understands basic 90% of the time/cues < 10% of the time  Expression Expression assist level: Expresses complex ideas: With extra time/assistive  device  Social Interaction Social Interaction assist level: Interacts appropriately with others with medication or extra time (anti-anxiety, antidepressant).  Problem Solving Problem solving assist level: Solves complex 90% of the time/cues < 10% of the time  Memory Memory assist level: More than reasonable amount of time   Medical Problem List and Plan:  1. Functional deficitssecondary to lumbar stenosis with neurogenic claudication -continue CIR therapies  -team conference today 2. DVT Prophylaxis/Anticoagulation: Mechanical: Sequential compression devices, below knee Bilateral lower extremities 3. Pain Management: Hydrocodone prn.   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 Mood: LCSW to follow for evaluation and support.  5. Neuropsych: This patient iscapable of making decisions on notown behalf. 6. Skin/Wound Care: Monitor wound daily for healing.  7. Fluids/Electrolytes/Nutrition: Monitor I/O.               -sl hyponatremia---recheck Thursday 8. HTN: Monitor BP bid with orthostatic vitals. Hold parameters on Norvasc, HCTZ and prinivil.  9. T2DM: reasonable control at present. Continue Levemir bid with SSI for elevated BS--po intake reasonable. Titrate upwards as indicated. 10. BPH s/p TURP: I/O cath to keep bladder decompressed.           - Continue flomax--increased to BID           -UA neg, ucx neg also           -needs to be OOB to void   -continue urecholine 25mg  tid---beginning to empty---up to toilet  -continue to decompress abdomen 11. Abdominal  pain/Constipation:   -belly appears much improved today  -stop reglan given diarrhea             -prn simethicone           -continue regular soft/lax   12. Post-op anemia   LOS (Days) 7 A FACE TO FACE EVALUATION WAS PERFORMED  Meredith Staggers, MD 02/02/2017 10:57 AM

## 2017-02-02 NOTE — Progress Notes (Signed)
Physical Therapy Session Note  Patient Details  Name: Donald Calderon MRN: [id number] Date of Birth: Dec 20, 1940  Today's Date: 02/02/2017 PT Individual Time: 1435-1530 PT Individual Time Calculation (min): 55 min   Short Term Goals: Week 1:  PT Short Term Goal 1 (Week 1): Pt will perform lying to sit with min assist and min cues  PT Short Term Goal 2 (Week 1): Pt will sit to stand with min assist consistantly through session PT Short Term Goal 3 (Week 1): Pt will ambulate 50 feet with RW and min guard PT Short Term Goal 4 (Week 1): Pt will initiate stair training   Skilled Therapeutic Interventions/Progress Updates: Pt received seated in bed, denies pain and agreeable to treatment. Supine>sit with minA; requires cues to perform log roll as pt initially trying to get up by rotating upper body towards side of bed. Stand pivot transfer to w/c with RW and minA. Sit <>stand with modA +2 and HHA; attempted gait without AD and +2 HHA however pt putting significant amount of weight through UEs and demonstrates insufficient weight shifting in combination with impaired sensation/proprioception necessary to progress LE. Performed tall kneeling with +2A to achieve position, min guard and tactile cueing for hip/trunk extension once in tall kneeling and gradual decrease of upper body support. Returned to room totalA following small incontinent bowel movement. Gait in/out of bathroom with RW and min guard; totalA for hygiene and clothing management with toileting. Gait x80' with min guard, repetitive cues for upright posture and reducing weight bearing through [place]. Remained seated in w/c at end of session, all needs in reach.      Therapy Documentation Precautions:  Precautions Precautions: Fall, Back Precaution Comments: Pt is able to report 3/3 back precautions  Required Braces or Orthoses: Spinal Brace Spinal Brace: Lumbar corset, Applied in sitting position Restrictions Weight Bearing Restrictions:  No Other Position/Activity Restrictions: At all times   See Function Navigator for Current Functional Status.   Therapy/Group: Individual Therapy  Luberta Mutter 02/02/2017, 3:30 PM

## 2017-02-02 NOTE — Progress Notes (Signed)
Occupational Therapy Session Note  Patient Details  Name: Donald Calderon MRN: [id number] Date of Birth: May 11, 1941  Today's Date: 02/02/2017 OT Individual Time: 1000-1100 OT Individual Time Calculation (min): 60 min    Short Term Goals: Week 1:  OT Short Term Goal 1 (Week 1): Pt will complete 2/3 steps for LB dressing using AE.  OT Short Term Goal 2 (Week 1): Pt will transfer to BSC/BSC over toilet with MinA.  OT Short Term Goal 3 (Week 1): Pt will complete sit<>stand with MinA during bathing task.  OT Short Term Goal 4 (Week 1): Pt will demonstrate adherence to back precautions during ADL completion with Min verbal cues.   Skilled Therapeutic Interventions/Progress Updates:    focus on BADL retraining including bathing at shower level, toileting, and dressing with sit<>stand from w/c at sink.  Pt amb with RW to bathroom for toileting prior to transferring to shower.  Pt completes shower at supervision level but requires assistance with LB dressing this morning.  Pt continues to be incontinent of bowel and bladder (voids with sit<>stand). Pt requires steady A for standing balance with BUE support.  Pt is able to pull up pants while using one UE as support.  Pt remained in w/c with all needs within reach.    Therapy Documentation Precautions:  Precautions Precautions: Fall, Back Precaution Comments: Pt is able to report 3/3 back precautions  Required Braces or Orthoses: Spinal Brace Spinal Brace: Lumbar corset, Applied in sitting position Restrictions Weight Bearing Restrictions: No Other Position/Activity Restrictions: At all times   Pain: Pain Assessment Pain Assessment: 0-10 Pain Score: 4  Pain Type: Acute pain Pain Location: Back Pain Orientation: Left Pain Descriptors / Indicators: Aching;Throbbing Pain Frequency: Occasional Pain Onset: Gradual Patients Stated Pain Goal: 2 Pain Intervention(s):premedicated  See Function Navigator for Current Functional  Status.   Therapy/Group: Individual Therapy  [name] 02/02/2017, 12:07 PM

## 2017-02-03 ENCOUNTER — Inpatient Hospital Stay (HOSPITAL_COMMUNITY): Payer: Medicare HMO

## 2017-02-03 ENCOUNTER — Inpatient Hospital Stay (HOSPITAL_COMMUNITY): Payer: Medicare HMO | Admitting: Physical Therapy

## 2017-02-03 LAB — GLUCOSE, CAPILLARY
Glucose-Capillary: 99 mg/dL (ref 65–99)
Glucose-Capillary: 153 mg/dL — ABNORMAL HIGH (ref 65–99)
Glucose-Capillary: 104 mg/dL — ABNORMAL HIGH (ref 65–99)
Glucose-Capillary: 219 mg/dL — ABNORMAL HIGH (ref 65–99)

## 2017-02-03 MED ORDER — WITCH HAZEL-GLYCERIN EX PADS
MEDICATED_PAD | CUTANEOUS | Status: DC | PRN
  Administered 2017-02-03 – 2017-02-07 (×2): 1 via TOPICAL
  Filled 2017-02-03: qty 100

## 2017-02-03 MED ORDER — HYDROCORTISONE ACETATE 25 MG RE SUPP
25.0000 mg | Freq: Two times a day (BID) | RECTAL | Status: DC
  Administered 2017-02-03 – 2017-02-04 (×3): 25 mg via RECTAL
  Filled 2017-02-03 (×3): qty 1

## 2017-02-03 NOTE — Progress Notes (Addendum)
Physical Therapy Note  Patient Details  Name: CALLAN NORDEN MRN: [id number] Date of Birth: 29-Jun-1940 Today's Date: 02/03/2017  1300-1400, 60 min individual tx Pain: 8/10 low back and hemorrhoids; premedicated  Gait with RW x 25', x 20' with min guard assist and w/c following.  Sit>< stand with mod VCS for technique as pt attempts to ballistically stand using bil UEs on RW, but is more successful with 1 hand on RW and 1 hand on w/c armrest.  .  Pt reported he was incontinent of bowel during gait.  W/c propulsion over level floor x 150' with supervision, to returneto room.  Toilet transfer to [facility] over toilet for B and B.  Pt with bleeding hemorrhoids.   PT consulted with Whitney, RN who will be treating hemorrhoids after PT session.  Seated neuro re-ed with visual feedback for R/L alternating ankle pumps x 10 and resisted bil hip abd x 10.  Stand pivot transfer with RW to return to bed.  Pt left resting in bed with  all needs within reach.  See function navigator for current status.  Windi Toro 02/03/2017, 1:36 PM

## 2017-02-03 NOTE — Progress Notes (Signed)
    Subjective:    Patient reports pain as 5 on 0-10 scale.   Denies CP or SOB.  Intermittent leg numbness b/l,  Pt reports improved control of voiding and bowel movements.  He still says intermittently he will think he is finished and he is not. He says he is progressing with rehab.  Objective: Vital signs in last 24 hours: Temp:  [97.5 F (36.4 C)-98.2 F (36.8 C)] 97.5 F (36.4 C) (08/22 0500) Pulse Rate:  [72-80] 72 (08/22 0500) Resp:  [16-18] 16 (08/22 0500) BP: (111)/(56) 111/56 (08/21 1309) SpO2:  [99 %-100 %] 99 % (08/22 0500)  Intake/Output from previous day: 08/21 0701 - 08/22 0700 In: 1200 [P.O.:1200] Out: 575 [Urine:575] Intake/Output this shift: Total I/O In: 236 [P.O.:236] Out: 350 [Urine:350]  Labs:  Recent Labs  02/02/17 0508  HGB 9.4*    Recent Labs  02/02/17 0508  WBC 8.4  RBC 3.25*  HCT 28.4*  PLT 301    Recent Labs  02/02/17 0508  NA 127*  K 3.8  CL 93*  CO2 29  BUN 12  CREATININE 1.08  GLUCOSE 112*  CALCIUM 8.4*   No results for input(s): LABPT, INR in the last 72 hours.  Physical Exam: Neurologically intact ABD soft Sensation intact distally Dorsiflexion/Plantar flexion intact Incision: no drainage Compartment soft  Assessment/Plan:    Continue care from Inpatient Rehab Will consult with Dr. Rolena Infante about suture removal.  [place], [name] for Dr. Melina Schools [facility] [phone] 02/03/2017, 12:54 PM    Patient ID: Donald Calderon, male   DOB: 24-Nov-1940, 76 y.o.   MRN: [id number]

## 2017-02-03 NOTE — Progress Notes (Signed)
Occupational Therapy Weekly Progress Note  Patient Details  Name: NIKIA MANGINO MRN: [id number] Date of Birth: 12/28/1940  Beginning of progress report period: January 27, 2017 End of progress report period: February 03, 2017  Patient has met 4 of 4 short term goals.  Pt has made steady progress with BADLs during the past week.  Pt requires steady A with sit<>stand, standing balance, and functional transfers.  Pt uses AE appropriately for LB bathing/dressing tasks and adheres to back precautions independently.  Pt requires BUE support when standing but is progressing to standing with one UE as support.  Pt continues to have periods of incontinence (bowel and bladder).  Patient continues to demonstrate the following deficits: muscle weakness, decreased cardiorespiratoy endurance and decreased standing balance, decreased postural control, decreased balance strategies and difficulty maintaining precautions and therefore will continue to benefit from skilled OT intervention to enhance overall performance with BADL and Reduce care partner burden.  Patient progressing toward long term goals..  Continue plan of care.  OT Short Term Goals Week 1:  OT Short Term Goal 1 (Week 1): Pt will complete 2/3 steps for LB dressing using AE.  OT Short Term Goal 1 - Progress (Week 1): Met OT Short Term Goal 2 (Week 1): Pt will transfer to BSC/BSC over toilet with MinA.  OT Short Term Goal 2 - Progress (Week 1): Met OT Short Term Goal 3 (Week 1): Pt will complete sit<>stand with MinA during bathing task.  OT Short Term Goal 3 - Progress (Week 1): Met OT Short Term Goal 4 (Week 1): Pt will demonstrate adherence to back precautions during ADL completion with Min verbal cues.  OT Short Term Goal 4 - Progress (Week 1): Met Week 2:  OT Short Term Goal 1 (Week 2): STG=LTG secondary to ELOS  Skilled Therapeutic Interventions/Progress Updates:      Therapy Documentation Precautions:  Precautions Precautions: Fall,  Back Precaution Comments: Pt is able to report 3/3 back precautions  Required Braces or Orthoses: Spinal Brace Spinal Brace: Lumbar corset, Applied in sitting position Restrictions Weight Bearing Restrictions: No Other Position/Activity Restrictions: At all times  See Function Navigator for Current Functional Status.   Therapy/Group:   [name] 02/03/2017, 12:04 PM

## 2017-02-03 NOTE — Progress Notes (Signed)
Patient attempted voiding before therapies on toilet.  He would dribble some then stop, and as he would attempt to stand, would dribble urine again.  PT notified RN of this occurrence.  RN scanned patients bladder showing greater than 440 ml in his bladder.  RN and NT intermittent cathed patient for approximately 350 ml.  Patient states "I did not feel full or tight like I have been."  Will continue to monitor for retention.  Brita Romp, RN

## 2017-02-03 NOTE — Progress Notes (Signed)
Physical Therapy Weekly Progress Note  Patient Details  Name: Donald Calderon MRN: [id number] Date of Birth: 1941-01-04  Beginning of progress report period: January 27, 2017 End of progress report period: February 03, 2017  Today's Date: 02/03/2017 PT Individual Time: 0800-0920 PT Individual Time Calculation (min): 80 min   Patient has met 4 of 4 short term goals. Standing tolerance is improving but significant weightbearing through UE and requires min assist. Pt also ambulates with significant weight bearing through arms and min assist. Pt demonstrates bed mobility with min assist and min cues for log roll. Stair training initiated on 3" steps due to pain and LE weakness. Will progress toward 6" steps as appropriate.   Patient continues to demonstrate the following deficits muscle weakness, decreased cardiorespiratoy endurance, motor apraxia and decreased coordination and decreased standing balance and decreased balance strategies and therefore will continue to benefit from skilled PT intervention to increase functional independence with mobility.  Patient progressing toward long term goals.  Continue plan of care.  PT Short Term Goals Week 1:  PT Short Term Goal 1 (Week 1): Pt will perform lying to sit with min assist and min cues  PT Short Term Goal 1 - Progress (Week 1): Met PT Short Term Goal 2 (Week 1): Pt will sit to stand with min assist consistantly through session PT Short Term Goal 2 - Progress (Week 1): Met PT Short Term Goal 3 (Week 1): Pt will ambulate 50 feet with RW and min guard PT Short Term Goal 3 - Progress (Week 1): Met PT Short Term Goal 4 (Week 1): Pt will initiate stair training  PT Short Term Goal 4 - Progress (Week 1): Met Week 2:  PT Short Term Goal 1 (Week 2): = LTG due to length of stay  Skilled Therapeutic Interventions/Progress Updates:    Pt received in bed finishing breakfast, agreeable to therapy. Pt min assist with supine>sit with flat bed and no rails. Pt  min assist with all sit<>stand. Pt able to stand with 1 UE supported and hold urinal with min guard. Max assist to don pants. Pt total assist to gym for energy conservation. Performed tall kneeling while moving hands on mat table to facilitate weight bearing through LE. Pt limited by knee pain. Performed dynamic standing balance activity of reaching for clothes pins and clipping on bar to facilitate weight shifting and increase standing tolerance. Cues for weight shift and upright posture. Pt min assist with sit <> supine on mat table. Pt performed 3 sets, 10 reps of TrA activation and  hip flexion. Pt returned to room total assist for toileting. Min assist for toilet transfers. Max assist for hygiene and donning brief. Handoff to RN with pt sitting in wheelchair.   Therapy Documentation Precautions:  Precautions Precautions: Fall, Back Precaution Comments: Pt is able to report 3/3 back precautions  Required Braces or Orthoses: Spinal Brace Spinal Brace: Lumbar corset, Applied in sitting position Restrictions Weight Bearing Restrictions: No Other Position/Activity Restrictions: At all times  See Function Navigator for Current Functional Status.  Therapy/Group: Individual Therapy  Gwinda Passe, SPT 02/03/2017, 10:32 AM

## 2017-02-03 NOTE — Progress Notes (Signed)
[facility]     PROGRESS NOTE    Subjective/Complaints: Bowels have slowed down. Emptying bladder better. Hemorrhoids still painful. Sitz bath helped  ROS: pt denies nausea, vomiting, diarrhea, cough, shortness of breath or chest pain     Objective: Vital Signs: Blood pressure (!) 111/56, pulse 72, temperature (!) 97.5 F (36.4 C), temperature source Oral, resp. rate 16, height 5\' 9"  (1.753 m), weight 90.4 kg (199 lb 3.2 oz), SpO2 99 %. No results found.  Recent Labs  02/02/17 0508  WBC 8.4  HGB 9.4*  HCT 28.4*  PLT 301    Recent Labs  02/02/17 0508  NA 127*  K 3.8  CL 93*  GLUCOSE 112*  BUN 12  CREATININE 1.08  CALCIUM 8.4*   CBG (last 3)   Recent Labs  02/02/17 1631 02/02/17 2142 02/03/17 0635  GLUCAP 160* 187* 99    Wt Readings from Last 3 Encounters:  01/28/17 90.4 kg (199 lb 3.2 oz)  01/22/17 94.3 kg (208 lb)  01/15/17 94.5 kg (208 lb 6.4 oz)    Physical Exam:  Constitutional: He is oriented to person, place, and time. He appears well-developedand well-nourished. No distress.  HENT:  Head: Normocephalicand atraumatic.  Mouth/Throat: Oropharynx is clear and moist.  Eyes: Pupils are equal, round, and reactive to light. Conjunctivaeand EOMare normal.  Neck: Normal range of motion. Neck supple.  Cardiovascular: RRR without murmur. No JVD        Respiratory: CTA Bilaterally without wheezes or rales. Normal effort       GI: nontender non-distended. hemorrhoids Musculoskeletal: He exhibits no edemaor tenderness.  Neurological: He is alertand oriented to person, place, and time.  Speech clear. Able to follow commands without difficulty.  Decreased LT/PP in distal legs.  Skin: Skin is warmand dry. He is not diaphoretic.  Back wound clean and dry with dressing Psychiatric: He has a normal mood and affect. His behavior is normal. Judgmentand thought contentnormal.   Assessment/Plan: 1. Functional deficits  secondary to lumbar stenosis/neurogenic claudication which require 3+ hours per day of interdisciplinary therapy in a comprehensive inpatient rehab setting. Physiatrist is providing close team supervision and 24 hour management of active medical problems listed below. Physiatrist and rehab team continue to assess barriers to discharge/monitor patient progress toward functional and medical goals.  Function:  Bathing Bathing position   Position: Shower  Bathing parts Body parts bathed by patient: Right arm, Left arm, Chest, Abdomen, Front perineal area, Right upper leg, Left upper leg, Right lower leg, Left lower leg, Buttocks Body parts bathed by helper: Buttocks  Bathing assist Assist Level: Supervision or verbal cues      Upper Body Dressing/Undressing Upper body dressing   What is the patient wearing?: Pull over shirt/dress, Orthosis     Pull over shirt/dress - Perfomed by patient: Thread/unthread right sleeve, Thread/unthread left sleeve, Put head through opening, Pull shirt over trunk       Orthosis activity level: Performed by patient  Upper body assist Assist Level: Supervision or verbal cues      Lower Body Dressing/Undressing Lower body dressing   What is the patient wearing?: Pants, Shoes Underwear - Performed by patient: Thread/unthread right underwear leg, Thread/unthread left underwear leg, Pull underwear up/down Underwear - Performed by helper: Pull underwear up/down Pants- Performed by patient: Thread/unthread left pants leg, Pull pants up/down Pants- Performed by helper: Thread/unthread right pants leg Non-skid slipper socks- Performed by patient: Don/doff left sock Non-skid slipper socks- Performed by helper:  Don/doff right sock Socks - Performed by patient: Don/doff right sock, Don/doff left sock   Shoes - Performed by patient: Don/doff left shoe Shoes - Performed by helper: Don/doff right shoe, Don/doff left shoe          Lower body assist Assist for  lower body dressing: Touching or steadying assistance (Pt > 75%)      Toileting Toileting   Toileting steps completed by patient: Adjust clothing prior to toileting, Adjust clothing after toileting, Performs perineal hygiene Toileting steps completed by helper: Adjust clothing prior to toileting, Performs perineal hygiene, Adjust clothing after toileting Toileting Assistive Devices: Grab bar or rail  Toileting assist Assist level: Touching or steadying assistance (Pt.75%)   Transfers Chair/bed transfer   Chair/bed transfer method: Stand pivot Chair/bed transfer assist level: Touching or steadying assistance (Pt > 75%) Chair/bed transfer assistive device: Armrests, Walker, Orthosis     Locomotion Ambulation     Max distance: 77 ft Assist level: Touching or steadying assistance (Pt > 75%)   Wheelchair Wheelchair activity did not occur: Safety/medical concerns (fatigue) Type: Manual Max wheelchair distance: 150 Assist Level: Supervision or verbal cues  Cognition Comprehension Comprehension assist level: Understands basic 90% of the time/cues < 10% of the time  Expression Expression assist level: Expresses complex ideas: With extra time/assistive device  Social Interaction Social Interaction assist level: Interacts appropriately with others with medication or extra time (anti-anxiety, antidepressant).  Problem Solving Problem solving assist level: Solves complex 90% of the time/cues < 10% of the time  Memory Memory assist level: More than reasonable amount of time   Medical Problem List and Plan:  1. Functional deficitssecondary to lumbar stenosis with neurogenic claudication -continue CIR therapies  -continues to progress 2. DVT Prophylaxis/Anticoagulation: Mechanical: Sequential compression devices, below knee Bilateral lower extremities 3. Pain Management: Hydrocodone prn.   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 Mood: LCSW to follow for  evaluation and support.  5. Neuropsych: This patient iscapable of making decisions on notown behalf. 6. Skin/Wound Care: Monitor wound daily for healing.  7. Fluids/Electrolytes/Nutrition: Monitor I/O.               -sl hyponatremia---recheck Thursday 8. HTN: Monitor BP bid with orthostatic vitals. Hold parameters on Norvasc, HCTZ and prinivil.  9. T2DM: reasonable control at present. Continue Levemir bid with SSI for elevated BS--po intake reasonable. Titrate upwards as indicated. 10. BPH s/p TURP:voiding more completely now (pvr's in 200's)           - Continue flomax-- BID           -UA neg, ucx neg also           -needs to be OOB to void   -continue urecholine 25mg  tid---beginning to empty---up to toilet  11. Abdominal pain/Constipation:   -improved. reglan stopped            -prn simethicone           -continue regular soft/lax   12. Post-op anemia   LOS (Days) 8 A FACE TO FACE EVALUATION WAS PERFORMED  Meredith Staggers, MD 02/03/2017 9:11 AM

## 2017-02-03 NOTE — Progress Notes (Signed)
Patient continues to have numerous incontinent loose small BMs today.  Hemorrhoids extremely painful, preventing him from participating in therapy fully.  New orders received for pain control r/t hemorrhoids.  Patient appears to be leaking BM when passing gas as well.  Brita Romp, RN

## 2017-02-03 NOTE — Progress Notes (Signed)
Occupational Therapy Session Note  Patient Details  Name: Donald Calderon MRN: [id number] Date of Birth: 11-19-1940  Today's Date: 02/03/2017 OT Individual Time: 1000-1100 OT Individual Time Calculation (min): 60 min    Short Term Goals: Week 2:  OT Short Term Goal 1 (Week 2): STG=LTG secondary to ELOS  Skilled Therapeutic Interventions/Progress Updates:    Pt resting in w/c upon arrival.  Pt engaged in BADL retraining including bathing at shower level and dressing with sit<>stand from w/c at sink.  Pt amb with RW to bathroom and transferred onto tub bench in shower.  Pt completed bathing tasks using AE appropriately without assistance. Pt incontinent of bowel and bladder while in shower.  Pt donned shirt and LSO before ambulating back to w/c in room for LB dressing tasks.  Pt required assistance with donning paper scub pants but was able to stand to pull up pants.  Focus on activity tolerance, sit<>stand, standing balance, functional amb with RW, and safety awareness to increase independence with BADLs.   Therapy Documentation Precautions:  Precautions Precautions: Fall, Back Precaution Comments: Pt is able to report 3/3 back precautions  Required Braces or Orthoses: Spinal Brace Spinal Brace: Lumbar corset, Applied in sitting position Restrictions Weight Bearing Restrictions: No Other Position/Activity Restrictions: At all times  See Function Navigator for Current Functional Status.   Therapy/Group: Individual Therapy  [name] 02/03/2017, 12:13 PM

## 2017-02-04 ENCOUNTER — Inpatient Hospital Stay (HOSPITAL_COMMUNITY): Payer: Medicare HMO

## 2017-02-04 ENCOUNTER — Inpatient Hospital Stay (HOSPITAL_COMMUNITY): Payer: Medicare HMO | Admitting: Physical Therapy

## 2017-02-04 DIAGNOSIS — K644 Residual hemorrhoidal skin tags: Secondary | ICD-10-CM

## 2017-02-04 LAB — GLUCOSE, CAPILLARY
GLUCOSE-CAPILLARY: 138 mg/dL — AB (ref 65–99)
Glucose-Capillary: 88 mg/dL (ref 65–99)
Glucose-Capillary: 142 mg/dL — ABNORMAL HIGH (ref 65–99)
Glucose-Capillary: 203 mg/dL — ABNORMAL HIGH (ref 65–99)

## 2017-02-04 LAB — BASIC METABOLIC PANEL
ANION GAP: 8 (ref 5–15)
BUN: 16 mg/dL (ref 6–20)
CALCIUM: 9 mg/dL (ref 8.9–10.3)
CO2: 26 mmol/L (ref 22–32)
Chloride: 95 mmol/L — ABNORMAL LOW (ref 101–111)
Creatinine, Ser: 1.23 mg/dL (ref 0.61–1.24)
GFR calc Af Amer: >60 mL/min (ref >60)
GFR, EST NON AFRICAN AMERICAN: 56 mL/min — AB (ref >60)
GLUCOSE: 96 mg/dL (ref 65–99)
Potassium: 4.1 mmol/L (ref 3.5–5.1)
Sodium: 129 mmol/L — ABNORMAL LOW (ref 135–145)

## 2017-02-04 MED ORDER — HYDROCORTISONE 2.5 % RE CREA
TOPICAL_CREAM | Freq: Four times a day (QID) | RECTAL | Status: DC
  Administered 2017-02-04 – 2017-02-11 (×26): via RECTAL
  Filled 2017-02-04 (×3): qty 28.35

## 2017-02-04 NOTE — Progress Notes (Signed)
Occupational Therapy Session Note  Patient Details  Name: Donald Calderon MRN: [id number] Date of Birth: 07-Aug-1940  Today's Date: 02/04/2017 OT Individual Time: 0830-0930 OT Individual Time Calculation (min): 60 min    Short Term Goals: Week 2:  OT Short Term Goal 1 (Week 2): STG=LTG secondary to ELOS  Skilled Therapeutic Interventions/Progress Updates:    Focus on ongoing BADL retraining including bathing at shower level and dressing with sit<>stand from w/c.  Pt amb with RW into/out of bathroom.  Pt performs sit<>stand in shower without assistance but requires unilateral UE support when standing.  Pt uses AE appropriately for LB bathing/dressing tasks.  Pt requires unilateral UE support when standing to pull up pants.  Pt remained in w/c with all needs within reach.   Therapy Documentation Precautions:  Precautions Precautions: Fall, Back Precaution Comments: Pt is able to report 3/3 back precautions  Required Braces or Orthoses: Spinal Brace Spinal Brace: Lumbar corset, Applied in sitting position Restrictions Weight Bearing Restrictions: No Other Position/Activity Restrictions: At all times    Pain: Pt c/o ongoing discomfort secondary to hemorrhoids; RN aware  See Function Navigator for Current Functional Status.   Therapy/Group: Individual Therapy  [name] 02/04/2017, 10:31 AM

## 2017-02-04 NOTE — Progress Notes (Signed)
Physical Therapy Session Note  Patient Details  Name: Donald Calderon MRN: [id number] Date of Birth: 1941-01-22  Today's Date: 02/04/2017 PT Individual Time: 0930-1000, 1414-1530 PT Individual Time Calculation (min): 30 min, 75 min  Short Term Goals: Week 2:  PT Short Term Goal 1 (Week 2): = LTG due to length of stay  Skilled Therapeutic Interventions/Progress Updates:    Session 1: Pt received sitting up in wheelchair, agreeable to therapy. Pt ambulated 50 ft with RW and close supervision. Cues for reduced weight bearing through UE. Pt transferred on/off Nustep with supervision and cues for placing feet back under body. Pt performed nustep for 10 minutes at L4, LE only. Pt ambulated 125 ft back to room with RW and close supervision. Pt left sitting up in chair, all needs in reach.   Session 2: Pt received sitting up in wheelchair, agreeable to therapy. Pt transferred chair>toilet with min guard for safety. Pt able to perform hygiene but required max assist to ensure clean. Max assist to don brief and shorts. Pt supervision with sit<>stand from wheelchair. Pt transferred total assist outside and back inside for energy conservation. Pt ambulated 100 ft x 2 and 50 ft x 1 outside over uneven surfaces, in busy environment with close supervision and minimal cues for increasing BOS. Rest breaks between. Pt performed 1 set, 20 reps bilat of ankle DF, PF, and eversion with theraband. Pt left sitting up in chair, wife present. Discussed setting up family education session with wife next week.   Therapy Documentation Precautions:  Precautions Precautions: Fall, Back Precaution Comments: Pt is able to report 3/3 back precautions  Required Braces or Orthoses: Spinal Brace Spinal Brace: Lumbar corset, Applied in sitting position Restrictions Weight Bearing Restrictions: No Other Position/Activity Restrictions: At all times Pain: Pt reports mild pain in low back.   See Function Navigator for Current  Functional Status.   Therapy/Group: Individual Therapy  Gwinda Passe, SPT 02/04/2017, 11:49 AM

## 2017-02-04 NOTE — Progress Notes (Signed)
Social Work Patient ID: Donald Calderon, male   DOB: 01/19/1941, 76 y.o.   MRN: [id number]   Have reviewed team conference info with pt and wife (via phone).  Both aware and agreeable with targeted d/c date of 8/31 with supervision goals overall.  Wife with no comments or questions, simply thanks me for the update.  Pt also with little comment and affect remains rather flat.  Will arrange follow up and DME next week.  Ercia Crisafulli, LCSW

## 2017-02-04 NOTE — Progress Notes (Signed)
Occupational Therapy Note  Patient Details  Name: Donald Calderon MRN: [id number] Date of Birth: [birth year]  Today's Date: 02/04/2017 OT Individual Time: 1300-1345 OT Individual Time Calculation (min): 45 min   Pt c/o ongoing discomfort from hemorrhoids; RN aware  INdividual Therapy  Pt resting in w/c upon arrival.  Pt transitioned to Day Room and engaged in static standing tasks at table.  Focus on use of BUE for functional tasks.  Pt continues to required one UE support.  Pt required max A for standing when pt attempts to engaged BUE in functional task.  Pt assembled/disassembled pipe tree structure.  Pt transitioned to Nustep (10 mins at work load 6).  Pt returned to room and remained in w/c with all needs within reach. Leotis Shames [facility] 02/04/2017, 1:47 PM

## 2017-02-04 NOTE — Patient Care Conference (Signed)
Inpatient RehabilitationTeam Conference and Plan of Care Update Date: 02/02/2017   Time: 2:15 PM    Patient Name: Donald Calderon      Medical Record Number: [id number]  Date of Birth: [birth year] Sex: Male         Room/Bed: 4W10C/4W10C-01 Payor Info: Payor: [employer] MEDICARE / Plan: [employer] MEDICARE HMO/PPO / Product Type: *No Product type* /    Admitting Diagnosis: Kynbar Stenosis  Admit Date/Time:  01/26/2017  5:38 PM Admission Comments: No comment available   Primary Diagnosis:  Lumbar stenosis Principal Problem: Lumbar stenosis  Patient Active Problem List   Diagnosis Date Noted  . Urine retention 01/29/2017  . 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 Lumbar stenosis 01/26/2017  . Diabetes mellitus type 2 in obese ([place])   . Benign essential HTN   . ETOH abuse   . Chronic pain syndrome   . Hyponatremia   . Leukocytosis   . Acute blood loss anemia   . Epidural hematoma ([place]) 01/23/2017  . Status post lumbar surgery 01/21/2017  . Status post lumbar spine operation 01/21/2017  . Back pain, acute 09/27/2011  . NEUROPATHY 08/08/2010  . SWELLING MASS OR LUMP IN HEAD AND NECK 06/27/2010  . BENIGN PROSTATIC HYPERTROPHY, WITH URINARY OBSTRUCTION 06/19/2010  . PERIPHERAL NEUROPATHY 05/12/2010  . HYPOKALEMIA 03/18/2010  . 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 DIABETES MELLITUS, TYPE II 02/07/2010  . HYPERLIPIDEMIA 02/07/2010  . ALCOHOL ABUSE 02/07/2010  . HYPERTENSION 02/07/2010  . SPINAL STENOSIS, LUMBAR 02/07/2010    Expected Discharge Date: Expected Discharge Date: 02/12/17  Team Members Present: Physician leading conference: Dr. Alger Simons Social Worker Present: Lennart Pall, LCSW Nurse Present: Heather Roberts, RN PT Present: Kem Parkinson, PT OT Present: Roanna Epley, [place]Six, OT SLP Present: Weston Anna, SLP PPS Coordinator present : Daiva Nakayama, RN, CRRN     Current Status/Progress Goal Weekly Team Focus  Medical   improving strength. bowels now emptying. bladder  starting too also, pain an issue/radiculopathy  improve bladder and bowel regularity and continence  rx ileus, normalize bowel regimen, optimize bladder medication/program   Bowel/Bladder   pt is continent and sometimes incontinent of b/b, voids occasionally, or bladder scan and  I&O cath q4 hours for >364ml, LBM 8/20(x3), pt has large hemorroids  manage b/b with min assist, pt is not progressing, voided on day shift 8/20 but had to be cathed q4 hours during the night 8/20-21  may need to downgrade goals, monitor b/b q shift, continue with flomax and urocholine to assist bladder, bladderscan q4 hour, cath for vol >331ml,PVR if voids.   Swallow/Nutrition/ Hydration             ADL's   bathing/dressing-steady A; functional transfers-min A; sit<>stand-min A  overall supervision  functional transfers, standing balance, ongoing educaiton, activity tolerance, safety awareness   Mobility   minA overall with RW; limited d/t strength/coordination deficits and pain/fatigue  modI bed mobility, S overall with LRAD limited household distances  LE coordination/strengthening, transfer and gait training, activity tolerance   Communication             Safety/Cognition/ Behavioral Observations            Pain   pt with occasional c/o incisional pain, will c/o pain to bladder, sensitive to need to empty bladder. Norco q 4 hours prn  pain <2 with min assist  monitor pain q shift and prn, administer prn medications as ordered.   Skin   L2-L4 incision with honeycomb dressing, previous drain site  to L flank with gauze dressing, drainage on 8/20.  maintain skin integrity while on IPR, goal is min assist,   monitor skin q shift and prn     Rehab Goals Patient on target to meet rehab goals: Yes *See Care Plan and progress notes for long and short-term goals.     Barriers to Discharge  Current Status/Progress Possible Resolutions Date Resolved   Physician    Medical stability;Neurogenic Bowel & Bladder         ongoing b/b mgt, titration of pain regimen      Nursing                  PT                    OT                  SLP                SW                Discharge Planning/Teaching Needs:  Plan home with wife who can provide supervision to light assistance.      Team Discussion:  Stomach calming?;  beter b/b fxn but still with increased PVRs sometimes after void.  Good progress with ADLs using AE - min assist.  Has done a few stairs (must do 6 at home).  Fearful of moving at times.  Continue to aim for supervision goals.  Revisions to Treatment Plan:  None    Continued Need for Acute Rehabilitation Level of Care: The patient requires daily medical management by a physician with specialized training in physical medicine and rehabilitation for the following conditions: Daily direction of a multidisciplinary physical rehabilitation program to ensure safe treatment while eliciting the highest outcome that is of practical value to the patient.: Yes Daily medical management of patient stability for increased activity during participation in an intensive rehabilitation regime.: Yes Daily analysis of laboratory values and/or radiology reports with any subsequent need for medication adjustment of medical intervention for : Neurological problems;Urological problems  Donald Calderon 02/04/2017, 1:43 PM

## 2017-02-04 NOTE — Progress Notes (Signed)
[place] PHYSICAL MEDICINE & REHABILITATION     PROGRESS NOTE    Subjective/Complaints: Hemorrhoids are really bothering him. Pop out when he is moving with therapy or trying to void.   Objective: Vital Signs: Blood pressure 123/68, pulse 64, temperature 98.2 F (36.8 C), temperature source Oral, resp. rate 15, height 5\' 9"  (1.753 m), weight 90.4 kg (199 lb 3.2 oz), SpO2 97 %. No results found.  Recent Labs  02/02/17 0508  WBC 8.4  HGB 9.4*  HCT 28.4*  PLT 301    Recent Labs  02/02/17 0508 02/04/17 0633  NA 127* 129*  K 3.8 4.1  CL 93* 95*  GLUCOSE 112* 96  BUN 12 16  CREATININE 1.08 1.23  CALCIUM 8.4* 9.0   CBG (last 3)   Recent Labs  02/03/17 1712 02/03/17 2114 02/04/17 0648  GLUCAP 104* 219* 88    Wt Readings from Last 3 Encounters:  01/28/17 90.4 kg (199 lb 3.2 oz)  01/22/17 94.3 kg (208 lb)  01/15/17 94.5 kg (208 lb 6.4 oz)    Physical Exam:  Constitutional: He is oriented to person, place, and time. He appears well-developedand well-nourished. No distress.  HENT:  Head: Normocephalicand atraumatic.  Mouth/Throat: Oropharynx is clear and moist.  Eyes: Pupils are equal, round, and reactive to light. Conjunctivaeand EOMare normal.  Neck: Normal range of motion. Neck supple.  Cardiovascular: RRR without murmur. No JVD         Respiratory: CTA Bilaterally without wheezes or rales. Normal effort        GI: nontender non-distended. Hemorrhoids small portion visibile, otherwise generally reduced, VERY sensitive to touch.  Musculoskeletal: He exhibits no edemaor tenderness.  Neurological: He is alertand oriented to person, place, and time.  Speech clear. Able to follow commands without difficulty.  Decreased LT/PP in distal legs.  Skin: Skin is warmand dry. He is not diaphoretic.  Back wound clean and dry with strips.  Psychiatric: He has a normal mood and affect. His behavior is normal. Judgmentand thought contentnormal.    Assessment/Plan: 1. Functional deficits secondary to lumbar stenosis/neurogenic claudication which require 3+ hours per day of interdisciplinary therapy in a comprehensive inpatient rehab setting. Physiatrist is providing close team supervision and 24 hour management of active medical problems listed below. Physiatrist and rehab team continue to assess barriers to discharge/monitor patient progress toward functional and medical goals.  Function:  Bathing Bathing position   Position: Shower  Bathing parts Body parts bathed by patient: Right arm, Left arm, Chest, Abdomen, Front perineal area, Right upper leg, Left upper leg, Right lower leg, Left lower leg, Buttocks Body parts bathed by helper: Buttocks  Bathing assist Assist Level: Supervision or verbal cues      Upper Body Dressing/Undressing Upper body dressing   What is the patient wearing?: Pull over shirt/dress, Orthosis     Pull over shirt/dress - Perfomed by patient: Thread/unthread right sleeve, Thread/unthread left sleeve, Put head through opening, Pull shirt over trunk       Orthosis activity level: Performed by patient  Upper body assist Assist Level: Supervision or verbal cues      Lower Body Dressing/Undressing Lower body dressing   What is the patient wearing?: Pants, Non-skid slipper socks Underwear - Performed by patient: Thread/unthread right underwear leg, Thread/unthread left underwear leg, Pull underwear up/down Underwear - Performed by helper: Pull underwear up/down Pants- Performed by patient: Thread/unthread right pants leg, Pull pants up/down Pants- Performed by helper: Thread/unthread left pants leg Non-skid slipper socks- Performed  by patient: Don/doff left sock Non-skid slipper socks- Performed by helper: Don/doff right sock Socks - Performed by patient: Don/doff right sock, Don/doff left sock   Shoes - Performed by patient: Don/doff left shoe Shoes - Performed by helper: Don/doff right shoe,  Don/doff left shoe          Lower body assist Assist for lower body dressing: Touching or steadying assistance (Pt > 75%)      Toileting Toileting   Toileting steps completed by patient: Adjust clothing prior to toileting, Adjust clothing after toileting, Performs perineal hygiene Toileting steps completed by helper: Adjust clothing prior to toileting, Performs perineal hygiene, Adjust clothing after toileting Toileting Assistive Devices: Grab bar or rail  Toileting assist Assist level: Two helpers   Transfers Chair/bed transfer   Chair/bed transfer [place]: Stand pivot Chair/bed transfer assist level: Touching or steadying assistance (Pt > 75%) Chair/bed transfer assistive device: Environmental consultant, Air cabin crew     Max distance: 25 Assist level: Touching or steadying assistance (Pt > 75%)   Wheelchair Wheelchair activity did not occur: Safety/medical concerns (fatigue) Type: Manual Max wheelchair distance: 150 Assist Level: Supervision or verbal cues  Cognition Comprehension Comprehension assist level: Understands basic 90% of the time/cues < 10% of the time  Expression Expression assist level: Expresses basic 90% of the time/requires cueing < 10% of the time.  Social Interaction Social Interaction assist level: Interacts appropriately 90% of the time - Needs monitoring or encouragement for participation or interaction.  Problem Solving Problem solving assist level: Solves basic 90% of the time/requires cueing < 10% of the time  Memory Memory assist level: Recognizes or recalls 75 - 89% of the time/requires cueing 10 - 24% of the time   Medical Problem List and Plan:  1. Functional deficitssecondary to lumbar stenosis with neurogenic claudication -continue CIR therapies  -continues to progress 2. DVT Prophylaxis/Anticoagulation: Mechanical: Sequential compression devices, below knee Bilateral lower extremities 3. Pain Management: Hydrocodone  prn.   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 Mood: LCSW to follow for evaluation and support.  5. Neuropsych: This patient iscapable of making decisions on notown behalf. 6. Skin/Wound Care: Monitor wound daily for healing.  7. Fluids/Electrolytes/Nutrition: Monitor I/O.               -sodium up to 129 today 8. HTN: Monitor BP bid with orthostatic vitals. Hold parameters on Norvasc, HCTZ and prinivil.  9. T2DM: reasonable control at present. Continue Levemir bid with SSI for elevated BS--po intake reasonable. Titrate upwards as indicated. 10. BPH s/p TURP:voiding more completely now (pvr's in 200's)           - Continue flomax-- BID           -UA neg, ucx neg also            -needs to be OOB to void   -continue urecholine 25mg  tid---beginning to empty---up to toilet  11. Constipation/hemorrhoids:   -improved. reglan stopped            -prn simethicone           -continue regular soft/lax  -sitz bath, add anusol cream   12. Post-op anemia   LOS (Days) 9 A FACE TO FACE EVALUATION WAS PERFORMED  Meredith Staggers, MD 02/04/2017 9:40 AM

## 2017-02-05 ENCOUNTER — Inpatient Hospital Stay (HOSPITAL_COMMUNITY): Payer: Medicare HMO | Admitting: Occupational Therapy

## 2017-02-05 ENCOUNTER — Inpatient Hospital Stay (HOSPITAL_COMMUNITY): Payer: Medicare HMO | Admitting: Physical Therapy

## 2017-02-05 LAB — GLUCOSE, CAPILLARY
GLUCOSE-CAPILLARY: 99 mg/dL (ref 65–99)
GLUCOSE-CAPILLARY: 219 mg/dL — AB (ref 65–99)
Glucose-Capillary: 107 mg/dL — ABNORMAL HIGH (ref 65–99)
Glucose-Capillary: 92 mg/dL (ref 65–99)

## 2017-02-05 MED ORDER — INSULIN DETEMIR 100 UNIT/ML ~~LOC~~ SOLN
15.0000 [IU] | Freq: Every day | SUBCUTANEOUS | Status: DC
  Administered 2017-02-06 – 2017-02-12 (×7): 15 [IU] via SUBCUTANEOUS
  Filled 2017-02-05 (×7): qty 0.15

## 2017-02-05 NOTE — Progress Notes (Signed)
Occupational Therapy Session Note  Patient Details  Name: SHIVANK PINEDO MRN: [id number] Date of Birth: 1941-01-07  Today's Date: 02/05/2017 OT Individual Time: 0930-1005 OT Individual Time Calculation (min): 35 min    Short Term Goals: Week 2:  OT Short Term Goal 1 (Week 2): STG=LTG secondary to ELOS  Skilled Therapeutic Interventions/Progress Updates:    Pt seen for BADL with a focus on safe transfers with RW and following back precautions. Pt used RW to ambulated to shower and then doffed clothing and brace. Used long sponge for LB to maintain precautions along with lateral leans.  Donned shirt then brace and ambulated back to w/c. Resting in chair with towel on lap.  Next OT session will continue to address LB dressing.  Pt with all needs met.  Therapy Documentation Precautions:  Precautions Precautions: Fall, Back Precaution Comments: Pt is able to report 3/3 back precautions  Required Braces or Orthoses: Spinal Brace Spinal Brace: Lumbar corset, Applied in sitting position Restrictions Weight Bearing Restrictions: No Other Position/Activity Restrictions: At all times Pain: Pain Assessment Pain Assessment: 0-10 Pain Score: 4  Pain Location: Back Pain Orientation: Mid;Lower Pain Descriptors / Indicators: Aching Patients Stated Pain Goal: 3 Pain Intervention(s): Medication (See eMAR);Repositioned ADL: ADL ADL Comments: Please see functional navigator   See Function Navigator for Current Functional Status.   Therapy/Group: Individual Therapy  [place] 02/05/2017, 12:14 PM

## 2017-02-05 NOTE — Progress Notes (Signed)
Physical Therapy Session Note  Patient Details  Name: Donald Calderon MRN: [id number] Date of Birth: 03/20/1941  Today's Date: 02/05/2017 PT Individual Time: 0815-0900 PT Individual Time Calculation (min): 45 min   Short Term Goals: Week 2:  PT Short Term Goal 1 (Week 2): = LTG due to length of stay  Skilled Therapeutic Interventions/Progress Updates:   Pt received supine in bed and agreeable to PT. Supine>sit transfer with mod assist to come to EOB. PT assisted pt to don LSO. Min assist for transfer and gait to toilet for continent bowel movement. Pt required moderate assist for personal hygiene standing at toilet. PT performed clothing management.   Pt instructed in [place] mobility to hall outside rehab gym with supervision assist from PT. Dynamic WC mobility to eave through 8 cones x 4 with min cues for awareness of obstacles on the L due to poor turning radius.   PT educated pt in [place] negotiation on 4 inch step with BUR support on RW. Min-mod assist and max multimodal cues for AD management and step to gait pattern.  Gait training x 150f with RW and min assist for clothing management.   Patient returned too [facility] and [facility] and all needs met.          Therapy Documentation Precautions:  Precautions Precautions: Fall, Back Precaution Comments: Pt is able to report 3/3 back precautions  Required Braces or Orthoses: Spinal Brace Spinal Brace: Lumbar corset, Applied in sitting position Restrictions Weight Bearing Restrictions: No Other Position/Activity Restrictions: At all times General: PT Amount of Missed Time (min): 15 Minutes PT Missed Treatment Reason: Other (Comment) (MD with patient and finishing breakfast) Pain: no complaint    See Function Navigator for Current Functional Status.   Therapy/Group: Individual Therapy  ALorie Phenix8/24/2018, 9:00 AM

## 2017-02-05 NOTE — Progress Notes (Signed)
[place] PHYSICAL MEDICINE & REHABILITATION     PROGRESS NOTE    Subjective/Complaints: Feels that he's voiding better although required I/O cath this morning. hemorrhoids still painful but treatments helping. Stools soft  ROS: pt denies nausea, vomiting, diarrhea, cough, shortness of breath or chest pain   Objective: Vital Signs: Blood pressure 137/69, pulse 69, temperature 97.6 F (36.4 C), temperature source Oral, resp. rate 12, height 5\' 9"  (1.753 m), weight 90.4 kg (199 lb 3.2 oz), SpO2 100 %. No results found. No results for input(s): WBC, HGB, HCT, PLT in the last 72 hours.  Recent Labs  02/04/17 0633  NA 129*  K 4.1  CL 95*  GLUCOSE 96  BUN 16  CREATININE 1.23  CALCIUM 9.0   CBG (last 3)   Recent Labs  02/04/17 1652 02/04/17 2051 02/05/17 0622  GLUCAP 142* 203* 99    Wt Readings from Last 3 Encounters:  01/28/17 90.4 kg (199 lb 3.2 oz)  01/22/17 94.3 kg (208 lb)  01/15/17 94.5 kg (208 lb 6.4 oz)    Physical Exam:  Constitutional: He is oriented to person, place, and time. He appears well-developedand well-nourished. No distress.  HENT:  Head: Normocephalicand atraumatic.  Mouth/Throat: Oropharynx is clear and moist.  Eyes: Pupils are equal, round, and reactive to light. Conjunctivaeand EOMare normal.  Neck: Normal range of motion. Neck supple.  Cardiovascular: RRR without murmur. No JVD         Respiratory: CTA Bilaterally without wheezes or rales. Normal effort        GI: nontender non-distended. Hemorrhoids small portion visibile, otherwise generally reduced, VERY sensitive to touch.  Musculoskeletal: He exhibits no edemaor tenderness.  Neurological: He is alertand oriented to person, place, and time.  Speech clear. Able to follow commands without difficulty.  Decreased LT/PP in distal legs.  Skin: Skin is warmand dry. He is not diaphoretic.  Back wound clean and dry with strips.  Psychiatric: He has a normal mood and affect. His  behavior is normal. Judgmentand thought contentnormal.   Assessment/Plan: 1. Functional deficits secondary to lumbar stenosis/neurogenic claudication which require 3+ hours per day of interdisciplinary therapy in a comprehensive inpatient rehab setting. Physiatrist is providing close team supervision and 24 hour management of active medical problems listed below. Physiatrist and rehab team continue to assess barriers to discharge/monitor patient progress toward functional and medical goals.  Function:  Bathing Bathing position   Position: Shower  Bathing parts Body parts bathed by patient: Right arm, Left arm, Chest, Abdomen, Front perineal area, Right upper leg, Left upper leg, Right lower leg, Left lower leg, Buttocks Body parts bathed by helper: Buttocks  Bathing assist Assist Level: Supervision or verbal cues (seated)      Upper Body Dressing/Undressing Upper body dressing   What is the patient wearing?: Pull over shirt/dress, Orthosis     Pull over shirt/dress - Perfomed by patient: Thread/unthread right sleeve, Thread/unthread left sleeve, Put head through opening, Pull shirt over trunk       Orthosis activity level: Performed by patient  Upper body assist Assist Level: Supervision or verbal cues      Lower Body Dressing/Undressing Lower body dressing   What is the patient wearing?: Pants, Non-skid slipper socks Underwear - Performed by patient: Thread/unthread right underwear leg, Thread/unthread left underwear leg, Pull underwear up/down Underwear - Performed by helper: Pull underwear up/down Pants- Performed by patient: Thread/unthread right pants leg, Pull pants up/down, Thread/unthread left pants leg Pants- Performed by helper: Thread/unthread left pants  leg Non-skid slipper socks- Performed by patient: Don/doff right sock, Don/doff left sock Non-skid slipper socks- Performed by helper: Don/doff right sock Socks - Performed by patient: Don/doff right sock, Don/doff  left sock   Shoes - Performed by patient: Don/doff left shoe Shoes - Performed by helper: Don/doff right shoe, Don/doff left shoe          Lower body assist Assist for lower body dressing: Touching or steadying assistance (Pt > 75%)      Toileting Toileting   Toileting steps completed by patient: Adjust clothing prior to toileting, Adjust clothing after toileting, Performs perineal hygiene Toileting steps completed by helper: Adjust clothing prior to toileting, Performs perineal hygiene, Adjust clothing after toileting Toileting Assistive Devices: Grab bar or rail  Toileting assist Assist level: Two helpers   Transfers Chair/bed transfer   Chair/bed transfer method: Stand pivot Chair/bed transfer assist level: Touching or steadying assistance (Pt > 75%) Chair/bed transfer assistive device: Environmental consultant, Air cabin crew     Max distance: 125 Assist level: Supervision or verbal cues   Wheelchair Wheelchair activity did not occur: Safety/medical concerns (fatigue) Type: Manual Max wheelchair distance: 150 Assist Level: Supervision or verbal cues  Cognition Comprehension Comprehension assist level: Understands basic 90% of the time/cues < 10% of the time  Expression Expression assist level: Expresses basic needs/ideas: With no assist  Social Interaction Social Interaction assist level: Interacts appropriately with others - No medications needed.  Problem Solving Problem solving assist level: Solves basic 90% of the time/requires cueing < 10% of the time  Memory Memory assist level: Recognizes or recalls 75 - 89% of the time/requires cueing 10 - 24% of the time   Medical Problem List and Plan:  1. Functional deficitssecondary to lumbar stenosis with neurogenic claudication -continue CIR therapies   2. DVT Prophylaxis/Anticoagulation: Mechanical: Sequential compression devices, below knee Bilateral lower extremities 3. Pain Management:  Hydrocodone prn.   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 Mood: LCSW to follow for evaluation and support.  5. Neuropsych: This patient iscapable of making decisions on notown behalf. 6. Skin/Wound Care: Monitor wound daily for healing.  7. Fluids/Electrolytes/Nutrition: Monitor I/O.               -sodium up to 129 today 8. HTN: Monitor BP bid with orthostatic vitals. Hold parameters on Norvasc, HCTZ and prinivil.  9. T2DM: reasonable control at present. Continue Levemir bid with SSI for elevated BS--po intake reasonable. Titrate upwards as indicated. 10. BPH s/p TURP:improved emptying but still req I/O cath at times           - Continue flomax-- BID           -UA neg, ucx neg also            -needs to be OOB to void   -continue urecholine 25mg  tid  -education for I/O cathing per RN  11. Constipation/hemorrhoids:   -stools soft/regular          -continue regular soft/lax  -sitz bath, add anusol cream, situational mgt   12. Post-op anemia   LOS (Days) 10 A FACE TO FACE EVALUATION WAS PERFORMED  Meredith Staggers, MD 02/05/2017 9:33 AM

## 2017-02-05 NOTE — Progress Notes (Signed)
RN and Tech assist pt with  Self in/out cath demonstration, pt master the task. He did exception well. Please continue to let pt participate in and out cath. And to implement spouse as well in education. Pt states he tend to void more during the day shift.

## 2017-02-05 NOTE — Progress Notes (Signed)
Physical Therapy Session Note  Patient Details  Name: KAPIL PETROPOULOS MRN: [id number] Date of Birth: [birth year]  Today's Date: 02/05/2017 PT Individual Time: 1300-1400 PT Individual Time Calculation (min): 60 min  Short Term Goals: Week 2:  PT Short Term Goal 1 (Week 2): = LTG due to length of stay  Skilled Therapeutic Interventions/Progress Updates:   Pt received sitting up in wheelchair, agreeable to therapy. Pt donned brace with supervision. Put on knee braces that pt previously wore at home to help with knee instability during gait. Pt reported that he felt more stable with knee braces on. Pt sit<>stand transfers with supervision and RW. Pt close supervision for transfers to/from toilet. Pt able to don/doff pants with min assist to get started. Pt ambulated 150 ft to gym with close supervision. Cues for decrease UE support. Pt performed lateral stepping with resistance from weighted box in parallel bars to address hip weakness. Pt performed 3 step ups onto bottom 6" stair with 2 rails and mod assist for stabilizing knees during step down and supporting weight during lowering. Pt limited by fatigue and pain. Pt returned to room total assist. Pt left sitting up in wheelchair, all needs in reach.     Therapy Documentation Precautions:  Precautions Precautions: Fall, Back Precaution Comments: Pt is able to report 3/3 back precautions  Required Braces or Orthoses: Spinal Brace Spinal Brace: Lumbar corset, Applied in sitting position Restrictions Weight Bearing Restrictions: No Other Position/Activity Restrictions: At all times   See Function Navigator for Current Functional Status.   Therapy/Group: Individual Therapy  Gwinda Passe, SPT 02/05/2017, 2:23 PM

## 2017-02-05 NOTE — Progress Notes (Signed)
Occupational Therapy Session Note  Patient Details  Name: Donald Calderon MRN: [id number] Date of Birth: 1941-03-24  Today's Date: 02/05/2017 OT Individual Time: 1100-1200 OT Individual Time Calculation (min): 60 min    Short Term Goals: Week 2:  OT Short Term Goal 1 (Week 2): STG=LTG secondary to ELOS  Skilled Therapeutic Interventions/Progress Updates:    Treatment session focused on ADL/self care training, transfer training, pt education on safety awareness during functional mobility with walker, activity tolerance/standing balance, and endurance training. Upon entering, pt in w/c and agreeable to OT session. Pt completed functional mobility in hallway at w/c level using B UE. Pt completed tub transfer with mod A for steeping into and out of tub using grab bars over 14" ledge. Pt instructed on safe transfers and hand/foot placement following back precautions. Pt completed therex on NuStep for 10 minutes without rest on level 4 with 580 steps. Pt noted fatigue after tasks with 3 min recovery. Pt completed standing tolerance task with 1 UE support and CGA without LOB. Pt educated on fluid functional mobility with walker to facility safe and balanced walking. Pt did not c/o pain during task and was returned to room to rest with his needs met.   Therapy Documentation Precautions:  Precautions Precautions: Fall, Back Precaution Comments: Pt is able to report 3/3 back precautions  Required Braces or Orthoses: Spinal Brace Spinal Brace: Lumbar corset, Applied in sitting position Restrictions Weight Bearing Restrictions: No Other Position/Activity Restrictions: At all times General: General PT Missed Treatment Reason: Other (Comment) (MD with patient and finishing breakfast) Pain: Pain Assessment Pain Assessment: No/denies pain Pain Score: 4  Pain Location: Back Pain Orientation: Mid;Lower Pain Descriptors / Indicators: Aching Patients Stated Pain Goal: 3 Pain Intervention(s):  Medication (See eMAR);Repositioned ADL: ADL ADL Comments: Please see functional navigator   See Function Navigator for Current Functional Status.   Therapy/Group: Individual Therapy  Delon [place] 02/05/2017, 12:27 PM

## 2017-02-06 ENCOUNTER — Inpatient Hospital Stay (HOSPITAL_COMMUNITY): Payer: Medicare HMO | Admitting: Physical Therapy

## 2017-02-06 LAB — GLUCOSE, CAPILLARY
GLUCOSE-CAPILLARY: 130 mg/dL — AB (ref 65–99)
Glucose-Capillary: 91 mg/dL (ref 65–99)
Glucose-Capillary: 131 mg/dL — ABNORMAL HIGH (ref 65–99)
Glucose-Capillary: 208 mg/dL — ABNORMAL HIGH (ref 65–99)

## 2017-02-06 NOTE — Progress Notes (Signed)
Physical Therapy Session Note  Patient Details  Name: Donald Calderon MRN: [id number] Date of Birth: 1941/05/10  Today's Date: 02/06/2017 PT Individual Time: 1430-1515 PT Individual Time Calculation (min): 45 min   Short Term Goals: Week 2:  PT Short Term Goal 1 (Week 2): = LTG due to length of stay  Skilled Therapeutic Interventions/Progress Updates:   Pt received sitting in WC and agreeable to PT with trade off from previous PT.   WC mobility training on level and unlevel surfaces 3 x 138f with supervision assist for improved use of BUE to control WC on downhill grade and decreased turning radius for doorway management.   Gait training instructed by PT on level and unlevel surface 70x 2 on unlevel surface of cement sidewalk and [id number] RW in hospital. Supervision assist from Pt with min cues for posture and and step height to prevent foot drag PT also instructed pt in step management with RW to simulate access to house. Ascent and descent x 2 with min assist and moderate cues for AD management.   Nustep reciprocal movement training x 8 minutes, level 3>4 with min cues for control of BLE and equalized step symmetry.   [facility] returned to [facility] and [facility] and all needs met.         Therapy Documentation Precautions:  Precautions Precautions: Fall, Back Precaution Comments: Pt is able to report 3/3 back precautions  Required Braces or Orthoses: Spinal Brace Spinal Brace: Lumbar corset, Applied in sitting position Restrictions Weight Bearing Restrictions: No Other Position/Activity Restrictions: At all times Vital Signs: Therapy Vitals Temp: 99.3 F (37.4 C) Temp Source: Oral Pulse Rate: 84 Resp: 17 BP: 121/68 Patient Position (if appropriate): Lying Oxygen Therapy SpO2: 100 % O2 Device: Not Delivered   See Function Navigator for Current Functional Status.   Therapy/Group: Individual Therapy  AuLorie Phenix/25/2018, 6:03 PM

## 2017-02-06 NOTE — Progress Notes (Signed)
Feels well , no complaints Obj: BP 128/71 (BP Location: Right Arm)   Pulse 78   Temp 98.5 F (36.9 C) (Oral)   Resp 16   Ht 5\' 9"  (1.753 m)   Wt 199 lb 3.2 oz (90.4 kg)   SpO2 100%   BMI 29.42 kg/m    well-developed well-nourished male in no acute distress. HEENT exam atraumatic, normocephalic, neck supple without jugular venous distention. Chest clear to auscultation cardiac exam S1-S2 are regular. Abdominal exam overweight with bowel sounds, soft and nontender. Extremities no edema. Sitting in wheelchair with back brace on .  A/p  Medical Problem List and Plan:  1. Functional deficitssecondary to lumbar stenosis with neurogenic claudication -continue CIR therapies            2. DVT Prophylaxis/Anticoagulation: Mechanical: Sequential compression devices, below knee Bilateral lower extremities 3. Pain Management: Hydrocodone prn.            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 Mood: LCSW to follow for evaluation and support.  5. Neuropsych: This patient iscapable of making decisions on notown behalf. 6. Skin/Wound Care: Monitor wound daily for healing.  7. Fluids/Electrolytes/Nutrition: Monitor I/O.     Basic Metabolic Panel:    Component Value Date/Time   NA 129 (L) 02/04/2017 0633   K 4.1 02/04/2017 0633   CL 95 (L) 02/04/2017 0633   CO2 26 02/04/2017 0633   BUN 16 02/04/2017 0633   CREATININE 1.23 02/04/2017 0633   GLUCOSE 96 02/04/2017 0633   CALCIUM 9.0 02/04/2017 0633    8. HTN: controlled 9. T2DM: will continue to monitor CBG (last 3)   Recent Labs  02/05/17 1659 02/05/17 2153 02/06/17 0626  GLUCAP 219* 92 130*     10. BPH s/p TURP:improved emptying but still req I/O cath at times -says he was able to urinate on own today  11. Constipation/hemorrhoids:           resolving today            12. Post-op anemia:

## 2017-02-06 NOTE — Progress Notes (Signed)
Patient has successfully performed self-cath x2 during day shift. Patient demonstrated to RN and NT correct technique for clean technique when having to perform self-cath at home after discharge. Will continue with education.

## 2017-02-07 ENCOUNTER — Inpatient Hospital Stay (HOSPITAL_COMMUNITY): Payer: Medicare HMO | Admitting: Occupational Therapy

## 2017-02-07 LAB — GLUCOSE, CAPILLARY
GLUCOSE-CAPILLARY: 73 mg/dL (ref 65–99)
GLUCOSE-CAPILLARY: 71 mg/dL (ref 65–99)
GLUCOSE-CAPILLARY: 194 mg/dL — AB (ref 65–99)
Glucose-Capillary: 108 mg/dL — ABNORMAL HIGH (ref 65–99)

## 2017-02-07 NOTE — Progress Notes (Signed)
SUBJ: feels well. Denies back pain, no complaints Eating well Having BMs Obj: BP 122/68 (BP Location: Left Arm)   Pulse 67   Temp 97.8 F (36.6 C) (Oral)   Resp 16   Ht 5\' 9"  (1.753 m)   Wt 199 lb 3.2 oz (90.4 kg)   SpO2 100%   BMI 29.42 kg/m     well-developed well-nourished male in no acute distress. HEENT exam atraumatic, normocephalic, neck supple without jugular venous distention. Chest clear to auscultation cardiac exam S1-S2 are regular. Abdominal exam overweight with bowel sounds, soft and nontender. Extremities no edema. Neurologic exam is alert. Wearing back brace   A/p  Medical Problem List and Plan:  1. Functional deficitssecondary to lumbar stenosis with neurogenci claudicationl -continue CIR therapies            2. DVT Prophylaxis/Anticoagulation: Mechanical: Sequential compression devices, below knee Bilateral lower extremities 3. Pain Management: currently well controlled.  4. Mood: LCSW to follow for evaluation and support.  5. Neuropsych: capable of making own decisions 6. Skin/Wound Care: Monitor wound daily for healing.  7. Fluids/Electrolytes/Nutrition: Monitor I/O.     Basic Metabolic Panel:    Component Value Date/Time   NA 129 (L) 02/04/2017 0633   K 4.1 02/04/2017 0633   CL 95 (L) 02/04/2017 0633   CO2 26 02/04/2017 0633   BUN 16 02/04/2017 0633   CREATININE 1.23 02/04/2017 0633   GLUCOSE 96 02/04/2017 0633   CALCIUM 9.0 02/04/2017 0633    8. HTN: 121/68-122/68 9. T2DM: variable control , will continue to monitor CBG (last 3)   Recent Labs  02/06/17 1711 02/06/17 2104 02/07/17 0625  GLUCAP 131* 208* 73     10. BPH s/p TURP: improving . Patient states he is able to void now  11. Constipation/hemorrhoids:           resolving today            12. Post-op anemia:

## 2017-02-07 NOTE — Plan of Care (Signed)
Problem: SCI BLADDER ELIMINATION Goal: RH STG MANAGE BLADDER WITH EQUIPMENT WITH ASSISTANCE STG Manage Bladder With Equipment With min Assistance   Outcome: Progressing Patient performed in and out cath with set up/supervision. Catheterization procedure reinforced and explained. Questions entertained.

## 2017-02-07 NOTE — Progress Notes (Signed)
Occupational Therapy Session Note  Patient Details  Name: Donald Calderon MRN: [id number] Date of Birth: [birth year]  Today's Date: 02/07/2017 OT Individual Time: 1005-1101 OT Individual Time Calculation (min): 56 min   Skilled Therapeutic Interventions/Progress Updates:    Tx focus on functional ambulation with device and standing balance during functional tasks.   Pt greeted sitting in w/c with back brace donned. Reported having already washed up/dressed. Ready for therapy. Pt ambulated with RW and Min A to bathroom to void bowels (Mod A sit<stand). Pt requiring assist for hygiene mgt due to inability to maintain precautions when attempting it himself.. Afterwards pt ambulated back to w/c. He self propelled to [employer], had him engage in laundry folding while listening to [employer] (knee braces donned). Focus on standing without bilateral UE support. Pt engaging in task with min-mod A for balance while integrating UEs in task (6 minutes max standing time without rest). He required cuing to increase postural awareness as he exhibited posterior lean. He also required cues for anterior weight shifting during power up (pt reports fear with leaning forward due to hx of falls). Pt reporting high pain level at this point. Notified RN who arrived during tx to administer medication. Discussed with pt modifying workshop at home so he can stand on stable but soft surfaces (supportive mats). That way he can continue meaningful occupational engagement while minimizing knee pain. Afterwards he self propelled back to room and was left with all needs within reach.   Therapy Documentation Precautions:  Precautions Precautions: Fall, Back Precaution Comments: Pt is able to report 3/3 back precautions  Required Braces or Orthoses: Spinal Brace Spinal Brace: Lumbar corset, Applied in sitting position Restrictions Weight Bearing Restrictions: No Other Position/Activity Restrictions: At all times General:   Vital  Signs: Therapy Vitals Temp: 97.8 F (36.6 C) Temp Source: Oral Pulse Rate: 67 Resp: 16 BP: 122/68 Patient Position (if appropriate): Lying Oxygen Therapy SpO2: 100 % O2 Device: Not Delivered Pain: Therapeutic music utilized for pain mgt. Also provided medication during tx    ADL: ADL ADL Comments: Please see functional navigator      See Function Navigator for Current Functional Status.   Therapy/Group: Individual Therapy  Donterius Filley A Suleima Ohlendorf 02/07/2017, 7:42 AM

## 2017-02-08 ENCOUNTER — Inpatient Hospital Stay (HOSPITAL_COMMUNITY): Payer: Medicare HMO | Admitting: Physical Therapy

## 2017-02-08 ENCOUNTER — Inpatient Hospital Stay (HOSPITAL_COMMUNITY): Payer: Medicare HMO

## 2017-02-08 ENCOUNTER — Encounter (HOSPITAL_COMMUNITY): Payer: Self-pay

## 2017-02-08 LAB — GLUCOSE, CAPILLARY
GLUCOSE-CAPILLARY: 98 mg/dL (ref 65–99)
Glucose-Capillary: 173 mg/dL — ABNORMAL HIGH (ref 65–99)
Glucose-Capillary: 148 mg/dL — ABNORMAL HIGH (ref 65–99)
Glucose-Capillary: 165 mg/dL — ABNORMAL HIGH (ref 65–99)

## 2017-02-08 NOTE — Progress Notes (Addendum)
Physical Therapy Note  Patient Details  Name: Donald Calderon MRN: [id number] Date of Birth: 1940-12-29 Today's Date: 02/06/2017; late entry  72-1430, 7 min individual tx Pain: discomfort due to hemorrhoids  Pt reported needing to use toilet.  Toilet transfer with min assist from w/c> [facility] over toilet for continent voiding.  Pt has more success voiding if he partially stands using armrests of BSC over toilet. Pt performed hand hygiene sitting in w/c.  PT passed pt to next therapist.   See function navigator for current status.   [name] 02/08/2017, 12:23 PM

## 2017-02-08 NOTE — Progress Notes (Addendum)
8/27; pt reporting increased pain in back and feeling cold all the time? recheck WBC and back incision; little edema at top of incision line or urine 2/2 continued inability to void = I+O caths q 4-6hr.  Also pt reports eye drops cause allergic reaction, doesn't want them, can we discontinue?    Temp fluxuation on 8/25-8/26; low grade 99.3-97. Thanks Margarito Liner

## 2017-02-08 NOTE — Progress Notes (Signed)
Physical Therapy Session Note  Patient Details  Name: Donald Calderon MRN: [id number] Date of Birth: Feb 03, 1941  Today's Date: 02/08/2017 PT Individual Time:850-930, [id number] PT Individual Time Calculation (min): 45 min, 40 min  Short Term Goals: Week 2:  PT Short Term Goal 1 (Week 2): = LTG due to length of stay  Skilled Therapeutic Interventions/Progress Updates:   Session 1: Pt received sitting up in wheelchair, agreeable to therapy. Pt sit<>stand from toilet with supervision, RW and grab bars. Pt able to perform hygiene with min assist to ensure clean. Pt washed hands sitting at sink with supervision and no cues. Pt ambulated 125 ft to day room with close supervision and cues for decrease WB in UE. Pt performed static standing balance with no UE support to promote WB in LE. Pt able to hold static stance 10 seconds. Pt performed 10 sit<>stands with supervision and cues for keeping feet positioned under body. Pt returned to room total assist. Left sitting up in wheelchair, all needs in reach.   Session 2: Pt received sitting up in wheelchair, agreeable to therapy. Pt displayed carryover from previous session for sit<>stand transfer with proper positioning of feet. Pt ambualted 150 ft to gym with RW and supervision. Pt performed mini squats over raised mat table for support with min guard, 2 set of 10 reps. Min squats to promote eccentric strengthening of quads in order to better manage stairs. Pt performed static balance activities in parallel bars progressing to staggered stance. Pt able to progress from 1 sec to ~5 sec. Pt returned to room total assist. Left sitting up in wheelchair, all needs in reach.   Therapy Documentation Precautions:  Precautions Precautions: Fall, Back Precaution Comments: Pt is able to report 3/3 back precautions  Required Braces or Orthoses: Spinal Brace Spinal Brace: Lumbar corset, Applied in sitting position Restrictions Weight Bearing Restrictions: No Other  Position/Activity Restrictions: At all times  See Function Navigator for Current Functional Status.   Therapy/Group: Individual Therapy  Gwinda Passe, SPT 02/08/2017, 11:30 AM

## 2017-02-08 NOTE — Progress Notes (Signed)
Occupational Therapy Session Note  Patient Details  Name: Donald Calderon MRN: [id number] Date of Birth: 24-Oct-1940  Today's Date: 02/08/2017 OT Individual Time: 0700-0800 OT Individual Time Calculation (min): 60 min    Short Term Goals: Week 2:  OT Short Term Goal 1 (Week 2): STG=LTG secondary to ELOS  Skilled Therapeutic Interventions/Progress Updates:    Pt resting in bed upon arrival.  Pt engaged in ongoing BADL retraining with focus on functional amb with RW, sit<>stand, standing balnce, toileting , bathing/dressing, activity tolerance, and safety awareness.  Pt amb with RW and used toilet prior to transferring to tub bench in shower.  Pt requires steady A/min A for functional amb with RW, transfers, and standing balance.  Pt completes all tasks using AE appropriately, requiring assistance only when standing.  Pt fatigues easily and requires multiple rest breaks during session.  Pt remained in w/c with all needs within reach.   Therapy Documentation Precautions:  Precautions Precautions: Fall, Back Precaution Comments: Pt is able to report 3/3 back precautions  Required Braces or Orthoses: Spinal Brace Spinal Brace: Lumbar corset, Applied in sitting position Restrictions Weight Bearing Restrictions: No Other Position/Activity Restrictions: At all times   Pain: Pain Assessment Pain Assessment: 0-10 Pain Score: 7  Pain Location: Back Pain Onset: On-going Patients Stated Pain Goal: 7 Pain Intervention(s): RN aware and meds admin prior to therapy   See Function Navigator for Current Functional Status.   Therapy/Group: Individual Therapy  [name] 02/08/2017, 12:06 PM

## 2017-02-08 NOTE — Progress Notes (Signed)
Occupational Therapy Note  Patient Details  Name: OTHMAR RINGER MRN: [id number] Date of Birth: February 10, 1941  Today's Date: 02/08/2017 OT Individual Time: 1345-1430 OT Individual Time Calculation (min): 45 min   Pt c/o 6/10 low back pain; RN aware and meds admin at beginning of session Individual Therapy  Pt resting in w/c upon arrival with RN present.  Pt propelled w/c to day room and engaged in static standing activities at hi-lo table.  Pt challenged with replicating colored peg pattern and removing pegs.  Pt required min verbal cues for selection of correct peg secondary to visual deficits (glaucoma).  Pt challenged with removing peg and transferring to opposite hand before placing in receptacle.  Pt stable for approx 15 seconds of task before requiring min A for balance and use of UE for support.  Pt fatigue quickly and required multiple rest breaks during session.  Pt propelled back to room and remained in w/c with all needs within reach.    Leotis Shames [facility] 02/08/2017, 2:35 PM

## 2017-02-08 NOTE — Progress Notes (Signed)
[facility] PHYSICAL MEDICINE & REHABILITATION     PROGRESS NOTE    Subjective/Complaints: Had a reasonable weekend. Low back tenderness since yesterday with radiation to his legs. No real change this morning. Bowels moved Saturday but not Sunday. occ I/o cath   ROS: pt denies nausea, vomiting, diarrhea, cough, shortness of breath or chest pain   Objective: Vital Signs: Blood pressure 117/70, pulse 69, temperature 98.6 F (37 C), temperature source Oral, resp. rate 17, height 5\' 9"  (1.753 m), weight 90.4 kg (199 lb 3.2 oz), SpO2 100 %. No results found. No results for input(s): WBC, HGB, HCT, PLT in the last 72 hours. No results for input(s): NA, K, CL, GLUCOSE, BUN, CREATININE, CALCIUM in the last 72 hours.  Invalid input(s): CO CBG (last 3)   Recent Labs  02/07/17 1616 02/07/17 2112 02/08/17 0633  GLUCAP 108* 194* 173*    Wt Readings from Last 3 Encounters:  01/28/17 90.4 kg (199 lb 3.2 oz)  01/22/17 94.3 kg (208 lb)  01/15/17 94.5 kg (208 lb 6.4 oz)    Physical Exam:  Constitutional: He is oriented to person, place, and time. He appears well-developedand well-nourished. No distress.  HENT:  Head: Normocephalicand atraumatic.  Mouth/Throat: Oropharynx is clear and moist.  Eyes: Pupils are equal, round, and reactive to light. Conjunctivaeand EOMare normal.  Neck: Normal range of motion. Neck supple.  Cardiovascular: RRR without murmur. No JVD          Respiratory: CTA Bilaterally without wheezes or rales. Normal effort         GI: NT/ND, hems not visualized.  Musculoskeletal: He exhibits no edemaor tenderness.  Neurological: He is alertand oriented to person, place, and time.  Speech clear. Able to follow commands without difficulty.  Decreased LT/PP in distal legs.  Skin: Skin is warmand dry. He is not diaphoretic.  Back wound cdi  Psychiatric: He has a normal mood and affect. His behavior is normal. Judgmentand thought contentnormal.    Assessment/Plan: 1. Functional deficits secondary to lumbar stenosis/neurogenic claudication which require 3+ hours per day of interdisciplinary therapy in a comprehensive inpatient rehab setting. Physiatrist is providing close team supervision and 24 hour management of active medical problems listed below. Physiatrist and rehab team continue to assess barriers to discharge/monitor patient progress toward functional and medical goals.  Function:  Bathing Bathing position   Position: Shower  Bathing parts Body parts bathed by patient: Right arm, Left arm, Chest, Abdomen, Front perineal area, Right upper leg, Left upper leg, Right lower leg, Left lower leg, Buttocks Body parts bathed by helper: Buttocks  Bathing assist Assist Level: Supervision or verbal cues      Upper Body Dressing/Undressing Upper body dressing   What is the patient wearing?: Pull over shirt/dress, Orthosis     Pull over shirt/dress - Perfomed by patient: Thread/unthread right sleeve, Thread/unthread left sleeve, Put head through opening, Pull shirt over trunk       Orthosis activity level: Performed by patient  Upper body assist Assist Level: Supervision or verbal cues      Lower Body Dressing/Undressing Lower body dressing   What is the patient wearing?: Pants, Non-skid slipper socks Underwear - Performed by patient: Thread/unthread right underwear leg, Thread/unthread left underwear leg, Pull underwear up/down Underwear - Performed by helper: Pull underwear up/down Pants- Performed by patient: Thread/unthread right pants leg, Pull pants up/down, Thread/unthread left pants leg Pants- Performed by helper: Thread/unthread left pants leg Non-skid slipper socks- Performed by patient: Don/doff right sock, Don/doff  left sock Non-skid slipper socks- Performed by helper: Don/doff right sock Socks - Performed by patient: Don/doff right sock, Don/doff left sock   Shoes - Performed by patient: Don/doff left  shoe Shoes - Performed by helper: Don/doff right shoe, Don/doff left shoe          Lower body assist Assist for lower body dressing: Touching or steadying assistance (Pt > 75%)      Toileting Toileting   Toileting steps completed by patient: Adjust clothing prior to toileting, Performs perineal hygiene, Adjust clothing after toileting Toileting steps completed by helper: Adjust clothing after toileting, Performs perineal hygiene Toileting Assistive Devices: Grab bar or rail  Toileting assist Assist level: Touching or steadying assistance (Pt.75%)   Transfers Chair/bed transfer   Chair/bed transfer method: Stand pivot Chair/bed transfer assist level: Touching or steadying assistance (Pt > 75%) Chair/bed transfer assistive device: Environmental consultant, Air cabin crew     Max distance: 89ft  Assist level: Supervision or verbal cues   Wheelchair Wheelchair activity did not occur: Safety/medical concerns Type: Manual Max wheelchair distance: 156ft  Assist Level: Supervision or verbal cues  Cognition Comprehension Comprehension assist level: Follows basic conversation/direction with no assist  Expression Expression assist level: Expresses basic needs/ideas: With no assist  Social Interaction Social Interaction assist level: Interacts appropriately with others - No medications needed.  Problem Solving Problem solving assist level: Solves basic problems with no assist  Memory Memory assist level: Requires cues to use assistive device   Medical Problem List and Plan:  1. Functional deficitssecondary to lumbar stenosis with neurogenic claudication -continue CIR therapies  -reviewed back pain today---continue LSO, observation for now 2. DVT Prophylaxis/Anticoagulation: Mechanical: Sequential compression devices, below knee Bilateral lower extremities 3. Pain Management: Hydrocodone prn for back   -continue low dose gabapentin for neuropathic pain 4. Mood:  LCSW to follow for evaluation and support.  5. Neuropsych: This patient iscapable of making decisions on notown behalf. 6. Skin/Wound Care: Monitor wound daily for healing.  7. Fluids/Electrolytes/Nutrition: Monitor I/O.               -sodium up to 129 8/23----recheck this tues 8. HTN: Monitor BP bid with orthostatic vitals. Hold parameters on Norvasc, HCTZ and prinivil.  9. T2DM: reasonable control at present. Continue Levemir bid with SSI for elevated BS--po intake reasonable. Titrate upwards as indicated. 10. BPH s/p TURP:improved emptying but still req I/O cath at times           - Continue flomax-- BID           -UA neg, ucx neg also            -needs to be OOB to void   -continue urecholine 25mg  tid  -educated for I/O cathing per RN  11. Constipation/hemorrhoids:   -stools soft/regular          -continue regular soft/lax  -continue sitz bath, add anusol cream, situational mgt   12. Post-op anemia   LOS (Days) 13 A FACE TO FACE EVALUATION WAS PERFORMED  Meredith Staggers, MD 02/08/2017 9:15 AM

## 2017-02-09 ENCOUNTER — Inpatient Hospital Stay (HOSPITAL_COMMUNITY): Payer: Medicare HMO

## 2017-02-09 ENCOUNTER — Inpatient Hospital Stay (HOSPITAL_COMMUNITY): Payer: Medicare HMO | Admitting: Physical Therapy

## 2017-02-09 LAB — CBC
HEMATOCRIT: 30.2 % — AB (ref 39.0–52.0)
HEMOGLOBIN: 10 g/dL — AB (ref 13.0–17.0)
MCH: 29.3 pg (ref 26.0–34.0)
MCHC: 33.1 g/dL (ref 30.0–36.0)
MCV: 88.6 fL (ref 78.0–100.0)
PLATELETS: 277 10*3/uL (ref 150–400)
RBC: 3.41 MIL/uL — AB (ref 4.22–5.81)
RDW: 12.5 % (ref 11.5–15.5)
WBC: 7.3 10*3/uL (ref 4.0–10.5)

## 2017-02-09 LAB — BASIC METABOLIC PANEL
ANION GAP: 7 (ref 5–15)
BUN: 13 mg/dL (ref 6–20)
CHLORIDE: 96 mmol/L — AB (ref 101–111)
CO2: 28 mmol/L (ref 22–32)
CREATININE: 0.96 mg/dL (ref 0.61–1.24)
Calcium: 8.9 mg/dL (ref 8.9–10.3)
GFR calc non Af Amer: >60 mL/min (ref >60)
Glucose, Bld: 119 mg/dL — ABNORMAL HIGH (ref 65–99)
POTASSIUM: 3.5 mmol/L (ref 3.5–5.1)
SODIUM: 131 mmol/L — AB (ref 135–145)

## 2017-02-09 LAB — GLUCOSE, CAPILLARY
GLUCOSE-CAPILLARY: 182 mg/dL — AB (ref 65–99)
GLUCOSE-CAPILLARY: 96 mg/dL (ref 65–99)
Glucose-Capillary: 116 mg/dL — ABNORMAL HIGH (ref 65–99)
Glucose-Capillary: 108 mg/dL — ABNORMAL HIGH (ref 65–99)

## 2017-02-09 NOTE — Progress Notes (Signed)
[place] PHYSICAL MEDICINE & REHABILITATION     PROGRESS NOTE    Subjective/Complaints: Had a good night. Back pain better. Still requiring caths at times. Better when he stands. Hemorrhoids are better  ROS: pt denies nausea, vomiting, diarrhea, cough, shortness of breath or chest pain   Objective: Vital Signs: Blood pressure 120/75, pulse 66, temperature 98.3 F (36.8 C), temperature source Oral, resp. rate 16, height 5\' 9"  (1.753 m), weight 90.4 kg (199 lb 3.2 oz), SpO2 98 %. No results found.  Recent Labs  02/09/17 0514  WBC 7.3  HGB 10.0*  HCT 30.2*  PLT 277    Recent Labs  02/09/17 0514  NA 131*  K 3.5  CL 96*  GLUCOSE 119*  BUN 13  CREATININE 0.96  CALCIUM 8.9   CBG (last 3)   Recent Labs  02/08/17 1631 02/08/17 2054 02/09/17 0613  GLUCAP 148* 165* 116*    Wt Readings from Last 3 Encounters:  01/28/17 90.4 kg (199 lb 3.2 oz)  01/22/17 94.3 kg (208 lb)  01/15/17 94.5 kg (208 lb 6.4 oz)    Physical Exam:  Constitutional: He is oriented to person, place, and time. He appears well-developedand well-nourished. No distress.  HENT:  Head: Normocephalicand atraumatic.  Mouth/Throat: Oropharynx is clear and moist.  Eyes: Pupils are equal, round, and reactive to light. Conjunctivaeand EOMare normal.  Neck: Normal range of motion. Neck supple.  Cardiovascular:RRR without murmur. No JVD         Respiratory: CTA Bilaterally without wheezes or rales. Normal effort        GI: NT/ND, hems not visualized.  Musculoskeletal: He exhibits minimal edemaor tenderness.  Neurological: He is alertand oriented to person, place, and time.  Speech clear. Able to follow commands without difficulty.  Decreased LT/PP in distal legs.  Skin: Skin is warmand dry. He is not diaphoretic.  Back wound cdi  Psychiatric: He has a normal mood and affect. His behavior is normal. Judgmentand thought contentnormal.   Assessment/Plan: 1. Functional deficits secondary to  lumbar stenosis/neurogenic claudication which require 3+ hours per day of interdisciplinary therapy in a comprehensive inpatient rehab setting. Physiatrist is providing close team supervision and 24 hour management of active medical problems listed below. Physiatrist and rehab team continue to assess barriers to discharge/monitor patient progress toward functional and medical goals.  Function:  Bathing Bathing position   Position: Shower  Bathing parts Body parts bathed by patient: Right arm, Left arm, Chest, Abdomen, Front perineal area, Right upper leg, Left upper leg, Right lower leg, Left lower leg, Buttocks Body parts bathed by helper: Buttocks  Bathing assist Assist Level: Supervision or verbal cues      Upper Body Dressing/Undressing Upper body dressing   What is the patient wearing?: Pull over shirt/dress, Orthosis     Pull over shirt/dress - Perfomed by patient: Thread/unthread right sleeve, Thread/unthread left sleeve, Put head through opening, Pull shirt over trunk       Orthosis activity level: Performed by patient  Upper body assist Assist Level: Supervision or verbal cues      Lower Body Dressing/Undressing Lower body dressing   What is the patient wearing?: Pants, Non-skid slipper socks Underwear - Performed by patient: Thread/unthread right underwear leg, Thread/unthread left underwear leg, Pull underwear up/down Underwear - Performed by helper: Pull underwear up/down Pants- Performed by patient: Thread/unthread right pants leg, Pull pants up/down, Thread/unthread left pants leg Pants- Performed by helper: Thread/unthread left pants leg Non-skid slipper socks- Performed by patient: Don/doff  right sock, Don/doff left sock Non-skid slipper socks- Performed by helper: Don/doff right sock Socks - Performed by patient: Don/doff right sock, Don/doff left sock   Shoes - Performed by patient: Don/doff left shoe Shoes - Performed by helper: Don/doff right shoe, Don/doff  left shoe          Lower body assist Assist for lower body dressing: Touching or steadying assistance (Pt > 75%)      Toileting Toileting   Toileting steps completed by patient: Adjust clothing prior to toileting, Performs perineal hygiene, Adjust clothing after toileting Toileting steps completed by helper: Adjust clothing after toileting, Performs perineal hygiene Toileting Assistive Devices: Grab bar or rail  Toileting assist Assist level: Touching or steadying assistance (Pt.75%)   Transfers Chair/bed transfer   Chair/bed transfer method: Stand pivot Chair/bed transfer assist level: Touching or steadying assistance (Pt > 75%) Chair/bed transfer assistive device: Environmental consultant, Air cabin crew     Max distance: 150 Assist level: Supervision or verbal cues   Wheelchair Wheelchair activity did not occur: Safety/medical concerns Type: Manual Max wheelchair distance: 153ft  Assist Level: Supervision or verbal cues  Cognition Comprehension Comprehension assist level: Follows basic conversation/direction with no assist  Expression Expression assist level: Expresses basic needs/ideas: With no assist  Social Interaction Social Interaction assist level: Interacts appropriately with others - No medications needed.  Problem Solving Problem solving assist level: Solves basic problems with no assist  Memory Memory assist level: Recognizes or recalls 90% of the time/requires cueing < 10% of the time   Medical Problem List and Plan:  1. Functional deficitssecondary to lumbar stenosis with neurogenic claudication -continue CIR therapies  -family ed  -team conference 2. DVT Prophylaxis/Anticoagulation: Mechanical: Sequential compression devices, below knee Bilateral lower extremities 3. Pain Management: Hydrocodone prn for back   -continue low dose gabapentin for neuropathic pain 4. Mood: LCSW to follow for evaluation and support.  5. Neuropsych: This  patient iscapable of making decisions on notown behalf. 6. Skin/Wound Care: Monitor wound daily for healing.  7. Fluids/Electrolytes/Nutrition: Monitor I/O.               -sodium up to 131 today 8. HTN: Monitor BP bid with orthostatic vitals. Hold parameters on Norvasc, HCTZ and prinivil.  9. T2DM: reasonable control at present. Continue Levemir bid with SSI for elevated BS--po intake reasonable. Titrate upwards as indicated. 10. BPH s/p TURP:improved emptying but still req I/O cath at times           - Continue flomax-- BID           -UA neg, ucx neg also            -needs to be OOB to void   -continue urecholine 25mg  tid  -educated for I/O cathing per RN  11. Constipation/hemorrhoids:   -stools soft/regular          -continue regular soft/lax  -continue sitz bath, add anusol cream, situational mgt   12. Post-op anemia   LOS (Days) 14 A FACE TO FACE EVALUATION WAS PERFORMED  Meredith Staggers, MD 02/09/2017 9:18 AM

## 2017-02-09 NOTE — Progress Notes (Signed)
Physical Therapy Session Note  Patient Details  Name: Donald Calderon MRN: [id number] Date of Birth: [birth year]  Today's Date: 02/09/2017 PT Individual Time: 1000-1100, 1500-1530 PT Individual Time Calculation (min): 60 min, 30 min  Short Term Goals: Week 2:  PT Short Term Goal 1 (Week 2): = LTG due to length of stay  Skilled Therapeutic Interventions/Progress Updates:    Pt received sitting up in chair, son present. Son, Alroy Dust, present for full session, educated on guarding and assist throughout session. Pt on/off toilet with supervision, RW and grab bars. Supervision provided by pt son. Pt ambulated 150 ft to gym with RW and supervision. Pt navigated up/down 8, 3" stairs with close supervision and 2 rails. Pt navigated up/down 4, 6" stairs with min assist for weight shift on ascend. Pt performed car transfer with supervision from son. Pt ambulated up ramp and down curb with supervision and cues for RW management. Transferred pt total assist back to room. Pt sit<>supine with supervision and cues for log roll technique. Pt ambulated to bathroom with supervision. Pt left sitting on toilet with son present. Son checked off to take pt to bathroom.    Session 2: Pt received sitting up in wheelchair, agreeable to therapy. Pt performed 4 stairs with 2 rails and min assist for weight shift on ascend. Pt performed weight shifting on kinetron for 2 min x 2 to promote WB and strengthening in LE. Pt limited by knee pain. Pt ambulated 80 ft back toward room with RW and supervision. Pt limited by episode of left knee buckling. Pr returned to room total assist. Pt left sitting up in wheelchair, all needs in reach, wife present.   Therapy Documentation Precautions:  Precautions Precautions: Fall, Back Precaution Comments: Pt is able to report 3/3 back precautions  Required Braces or Orthoses: Spinal Brace Spinal Brace: Lumbar corset, Applied in sitting position Restrictions Weight Bearing Restrictions:  No Other Position/Activity Restrictions: At all times    Pain: Reports no pain in back Reports some knee pain  See Function Navigator for Current Functional Status.   Therapy/Group: Individual Therapy  Gwinda Passe, SPT 02/09/2017, 1:50 PM

## 2017-02-09 NOTE — Plan of Care (Signed)
Problem: SCI BLADDER ELIMINATION Goal: RH STG MANAGE BLADDER WITH MEDICATION WITH ASSISTANCE STG Manage Bladder With Medication With min Assistance.   Outcome: Not Progressing Self catheterization every 4 to 6 hours Goal: RH STG MANAGE BLADDER WITH EQUIPMENT WITH ASSISTANCE STG Manage Bladder With Equipment With min Assistance   Outcome: Progressing In and out cath  Problem: RH SKIN INTEGRITY Goal: RH STG SKIN FREE OF INFECTION/BREAKDOWN Incision without infection, pt/cg perform incision care with min assist  Outcome: Progressing Sutures dry and intact to back incision, no signs of infection noted  Problem: RH PAIN MANAGEMENT Goal: RH STG PAIN MANAGED AT OR BELOW PT'S PAIN GOAL < 4 with min assist.    Outcome: Progressing Medicated once for pain with moderate relief

## 2017-02-09 NOTE — Progress Notes (Signed)
Occupational Therapy Session Note  Patient Details  Name: Donald Calderon MRN: [id number] Date of Birth: 17-Feb-1941  Today's Date: 02/09/2017 OT Individual Time: [phone] OT Individual Time Calculation (min): 85 min    Short Term Goals: Week 2:  OT Short Term Goal 1 (Week 2): STG=LTG secondary to ELOS  Skilled Therapeutic Interventions/Progress Updates:    Pt resting in w/c upon arrival with wife and son present.  Pt's son actively participated in therapy and observed level of assistance required.  Pt's wife observed throughout therapy session.  Pt engaged in BADL retraining including toileting, bathing at shower level, and dressing with sit<>stand from w/c.  Pt completed all tasks without physical assistance, using AE appropriately to complete LB bathing and dressing tasks.  Pt propelled w/c to ADL tub room and practiced tub bench transfers with RW (supervision).  Pt propelled w/c to day room and engaged in standing activities with and without BUE support.  Pt requires steady A when standing without UE support.  Pt's son verbalized understanding of recommendation for 24 hour supervision.  Pt returned to room and remained in w/c with all needs within reach and son present.    Therapy Documentation Precautions:  Precautions Precautions: Fall, Back Precaution Comments: Pt is able to report 3/3 back precautions  Required Braces or Orthoses: Spinal Brace Spinal Brace: Lumbar corset, Applied in sitting position Restrictions Weight Bearing Restrictions: No Other Position/Activity Restrictions: At all times   Pain:  PT denies pain this morning.  See Function Navigator for Current Functional Status.   Therapy/Group: Individual Therapy  [name] 02/09/2017, 10:01 AM

## 2017-02-09 NOTE — Progress Notes (Signed)
Occupational Therapy Note  Patient Details  Name: Donald Calderon MRN: [id number] Date of Birth: November 22, 1940  Today's Date: 02/09/2017 OT Individual Time: 1300-1330 OT Individual Time Calculation (min): 30 min   Pt denies pain Individual therapy  Pt resting in w/c upon arrival.  Pt propelled w/c to gym and engaged in dynamic standing tasks with Dynavision; 1 X 4 mins, 2 X 2 mins with alternating UE support, and 2 X 2 mins with no UE support.  Pt required steady A with no UE support.  Pt propelled back to room and remained in w/c with all needs within reach.    Leotis Shames [facility] 02/09/2017, 1:31 PM

## 2017-02-10 ENCOUNTER — Encounter (HOSPITAL_COMMUNITY): Payer: Self-pay

## 2017-02-10 ENCOUNTER — Inpatient Hospital Stay (HOSPITAL_COMMUNITY): Payer: Medicare HMO

## 2017-02-10 ENCOUNTER — Inpatient Hospital Stay (HOSPITAL_COMMUNITY): Payer: Medicare HMO | Admitting: *Deleted

## 2017-02-10 LAB — GLUCOSE, CAPILLARY
Glucose-Capillary: 96 mg/dL (ref 65–99)
Glucose-Capillary: 99 mg/dL (ref 65–99)
Glucose-Capillary: 129 mg/dL — ABNORMAL HIGH (ref 65–99)
Glucose-Capillary: 152 mg/dL — ABNORMAL HIGH (ref 65–99)

## 2017-02-10 NOTE — Progress Notes (Signed)
Recreational Therapy Session Note  Patient Details  Name: Donald Calderon MRN: [id number] Date of Birth: [birth year] Today's Date: 02/10/2017  Pain: no c/o Skilled Therapeutic Interventions/Progress Updates:  Goal:  Pt will participate in TR tasks maintaining dynamic standing balance with min assist.  Pt participated in beach ball activity standing with 1UE support for ball tapping activity with focus on activity tolerance and dynamic standing balance.  Pt required close supervision-min assist for balance.  Discharge planning in regards to concerns and ability to return to leisure interests including community pursuits.  Therapy/Group: Co-Treatment   Tim Wilhide 02/10/2017, 4:18 PM

## 2017-02-10 NOTE — Progress Notes (Signed)
Occupational Therapy Note  Patient Details  Name: Donald Calderon MRN: [id number] Date of Birth: 1940-11-18  Today's Date: 02/10/2017 OT Individual Time: 1300-1330 OT Individual Time Calculation (min): 30 min   Pt denies pain Individual Therapy  Pt practiced functional amb with Rollator with multiple sit<>stand and turning around while maintaining back precautions.  Pt practiced sit<>stand X 6 and amb to nursing station and back to room.  Focus on safety awareness, sit<>stand, activity tolerance, and functional amb with RW to increased independence with BADLs.    Leotis Shames [facility] 02/10/2017, 3:03 PM

## 2017-02-10 NOTE — Progress Notes (Signed)
Occupational Therapy Session Note  Patient Details  Name: Donald Calderon MRN: [id number] Date of Birth: January 30, 1941  Today's Date: 02/10/2017 OT Individual Time: 0900-1030 OT Individual Time Calculation (min): 90 min    Short Term Goals: Week 2:  OT Short Term Goal 1 (Week 2): STG=LTG secondary to ELOS  Skilled Therapeutic Interventions/Progress Updates:    Pt initially engaged in BADL retraining including bathing at shower level and dressing with sit<>stand from w/c.  Pt amb with RW to bathroom and transferred onto tub bench to complete bathing tasks (supervision). Pt donned shirt and corset prior to amb back to room to complete LB dressing tasks with appropriate AE.  Pt propelled w/c to day room and engaged in dynamic standing activities with Recreational Therapist (cotx)-tapping beach ball back to Rec Therapist.  Continued ongoing discharge planning and equipment needs.  Pt practiced use of Rollator before returning to room.  Pt remained in w/c with all needs within reach.    Therapy Documentation Precautions:  Precautions Precautions: Fall, Back Precaution Comments: Pt is able to report 3/3 back precautions  Required Braces or Orthoses: Spinal Brace Spinal Brace: Lumbar corset, Applied in sitting position Restrictions Weight Bearing Restrictions: No Other Position/Activity Restrictions: At all times   Pain: Pain Assessment Pain Assessment: 0-10 Pain Score: 2  Faces Pain Scale: Hurts whole lot Pain Type: Chronic pain Pain Location: Knee Pain Orientation: Right;Left Pain Descriptors / Indicators: Shooting;Throbbing Pain Onset: With Activity Patients Stated Pain Goal: 0 Pain Intervention(s): Medication (See eMAR);Cold applied Multiple Pain Sites: Yes  See Function Navigator for Current Functional Status.   Therapy/Group: Individual Therapy  [name] 02/10/2017, 12:02 PM

## 2017-02-10 NOTE — Progress Notes (Signed)
[place] PHYSICAL MEDICINE & REHABILITATION     PROGRESS NOTE    Subjective/Complaints: Pt upset about some issues surrounding his I/O cath this morning. Needed to be talked down a bit. On the positive side, he had a good day with therapy, legs are getting stronger, family ed went well  ROS: pt denies nausea, vomiting, diarrhea, cough, shortness of breath or chest pain      Objective: Vital Signs: Blood pressure 131/69, pulse 80, temperature 98 F (36.7 C), temperature source Oral, resp. rate 16, height 5\' 9"  (1.753 m), weight 90.4 kg (199 lb 3.2 oz), SpO2 100 %. No results found.  Recent Labs  02/09/17 0514  WBC 7.3  HGB 10.0*  HCT 30.2*  PLT 277    Recent Labs  02/09/17 0514  NA 131*  K 3.5  CL 96*  GLUCOSE 119*  BUN 13  CREATININE 0.96  CALCIUM 8.9   CBG (last 3)   Recent Labs  02/09/17 1651 02/09/17 2124 02/10/17 0618  GLUCAP 182* 96 96    Wt Readings from Last 3 Encounters:  01/28/17 90.4 kg (199 lb 3.2 oz)  01/22/17 94.3 kg (208 lb)  01/15/17 94.5 kg (208 lb 6.4 oz)    Physical Exam:  Constitutional: He is oriented to person, place, and time. He appears well-developedand well-nourished. No distress.  HENT:  Head: Normocephalicand atraumatic.  Mouth/Throat: Oropharynx is clear and moist.  Eyes: Pupils are equal, round, and reactive to light. Conjunctivaeand EOMare normal.  Neck: Normal range of motion. Neck supple.  Cardiovascular:RRR without murmur. No JVD          Respiratory: CTA Bilaterally without wheezes or rales. Normal effort         GI: NT/ND, hems not visualized.  Musculoskeletal: He exhibits minimal edemaor tenderness.  Neurological: He is alertand oriented to person, place, and time.  Speech clear. Able to follow commands without difficulty.  Decreased LT/PP in distal legs.  Skin: Skin is warmand dry.  Back wound cdi, stable Psychiatric: He has a normal mood and affect. His behavior is normal. Judgmentand thought  contentnormal.   Assessment/Plan: 1. Functional deficits secondary to lumbar stenosis/neurogenic claudication which require 3+ hours per day of interdisciplinary therapy in a comprehensive inpatient rehab setting. Physiatrist is providing close team supervision and 24 hour management of active medical problems listed below. Physiatrist and rehab team continue to assess barriers to discharge/monitor patient progress toward functional and medical goals.  Function:  Bathing Bathing position   Position: Shower  Bathing parts Body parts bathed by patient: Right arm, Left arm, Chest, Abdomen, Front perineal area, Right upper leg, Left upper leg, Right lower leg, Left lower leg, Buttocks Body parts bathed by helper: Buttocks  Bathing assist Assist Level: Supervision or verbal cues      Upper Body Dressing/Undressing Upper body dressing   What is the patient wearing?: Pull over shirt/dress, Orthosis     Pull over shirt/dress - Perfomed by patient: Thread/unthread right sleeve, Thread/unthread left sleeve, Put head through opening, Pull shirt over trunk       Orthosis activity level: Performed by patient  Upper body assist Assist Level: Supervision or verbal cues      Lower Body Dressing/Undressing Lower body dressing   What is the patient wearing?: Pants, Non-skid slipper socks Underwear - Performed by patient: Thread/unthread right underwear leg, Thread/unthread left underwear leg, Pull underwear up/down Underwear - Performed by helper: Pull underwear up/down Pants- Performed by patient: Thread/unthread right pants leg, Pull pants up/down,  Thread/unthread left pants leg Pants- Performed by helper: Thread/unthread left pants leg Non-skid slipper socks- Performed by patient: Don/doff right sock, Don/doff left sock Non-skid slipper socks- Performed by helper: Don/doff right sock Socks - Performed by patient: Don/doff right sock, Don/doff left sock   Shoes - Performed by patient:  Don/doff left shoe Shoes - Performed by helper: Don/doff right shoe, Don/doff left shoe          Lower body assist Assist for lower body dressing: Touching or steadying assistance (Pt > 75%)      Toileting Toileting   Toileting steps completed by patient: Adjust clothing prior to toileting, Performs perineal hygiene Toileting steps completed by helper: Adjust clothing after toileting Toileting Assistive Devices: Grab bar or rail  Toileting assist Assist level: Supervision or verbal cues   Transfers Chair/bed transfer   Chair/bed transfer method: Squat pivot Chair/bed transfer assist level: Supervision or verbal cues Chair/bed transfer assistive device: Environmental consultant, Air cabin crew     Max distance: 15 Assist level: Supervision or verbal cues   Wheelchair Wheelchair activity did not occur: Safety/medical concerns Type: Manual Max wheelchair distance: 20 Assist Level: No help, No cues, assistive device, takes more than reasonable amount of time  Cognition Comprehension Comprehension assist level: Follows basic conversation/direction with no assist  Expression Expression assist level: Expresses basic needs/ideas: With no assist  Social Interaction Social Interaction assist level: Interacts appropriately with others - No medications needed.  Problem Solving Problem solving assist level: Solves basic problems with no assist  Memory Memory assist level: Recognizes or recalls 90% of the time/requires cueing < 10% of the time   Medical Problem List and Plan:  1. Functional deficitssecondary to lumbar stenosis with neurogenic claudication -continue CIR therapies  -family ed successful 2. DVT Prophylaxis/Anticoagulation: Mechanical: Sequential compression devices, below knee Bilateral lower extremities 3. Pain Management: Hydrocodone prn for back   -continue low dose gabapentin for neuropathic pain 4. Mood: LCSW to follow for evaluation and support.   5. Neuropsych: This patient iscapable of making decisions on notown behalf. 6. Skin/Wound Care: Monitor wound daily for healing.  7. Fluids/Electrolytes/Nutrition: Monitor I/O.               -sodium up to 131 on 8/28 8. HTN: Monitor BP bid with orthostatic vitals. Hold parameters on Norvasc, HCTZ and prinivil.  9. T2DM: reasonable control at present. Continue Levemir bid with SSI for elevated BS--po intake reasonable. Titrate upwards as indicated. 10. BPH s/p TURP:improved emptying but still req I/O cath at times           - Continue flomax-- BID           -UA neg, ucx neg also            -needs to be OOB to void   -continue urecholine 25mg  tid  -pt can I/O cath with set up  11. Constipation/hemorrhoids: improved  -stools soft/regular          -continue regular soft/lax  -continue sitz bath, add anusol cream, situational mgt   12. Post-op anemia   LOS (Days) 15 A FACE TO FACE EVALUATION WAS PERFORMED  Meredith Staggers, MD 02/10/2017 8:57 AM

## 2017-02-10 NOTE — Progress Notes (Addendum)
Physical Therapy Note  Patient Details  Name: Donald Calderon MRN: [id number] Date of Birth: 24-Dec-1940 Today's Date: 02/10/2017  tx 1:  [id number], 45 min individual tx Pain:8/10 L lower back; RN informed    Pt stated he feels full and had not urinated or been cath'd since around 2AM.  Bed mobility in raised bed to come to sitting to eat breakfast, superviison.  Pt donned LSO in sitting independently after it was given to him. Pt sat EOB with feet supported on floor to eat. Squat pivot bed> w/c to L to toilet for continent voiding; no BM.  Gait with RW toilet> room.  Pt stated L knee felt unstable, and buckled slightly while standing at sink for hand hygiene. Pt left resting in w/c with quick release belt applied and all needs within reach.  tx 2:  1445-1545, 60 min individual tx Pain: none per pt  Pt stated he needed to use toilet.  Gait with Rollator walker in room to/from toilet with close supervision, no cues needed for brakes.  Toilet transfer, but pt did not void.  Seated Therapeutic exercise performed with LE to increase strength for functional mobility: 15 x 1 each bil glut sets, R/L long arc quad with 3 second hold at end range.  Gait on level tile and carpet to ambulate to dayroom with Rollator, one cue for brakes.  NuStep at level 4 x 10 minutes for activity tolerance and alternating reciprocal movement.  Attempted 6 high stairs, but pt's R knee felt unstable upon sit> stand.   PT reviewed need to pivot feet when turning body in standing, and sequencing on stairs. Pt propelled w/c back to room, with instruction for increased efficiency. Pt left resting in w/c with all needs within reach.   See function navigator for current status.  Shaindel Sweeten 02/10/2017, 7:48 AM

## 2017-02-10 NOTE — Patient Care Conference (Signed)
Inpatient RehabilitationTeam Conference and Plan of Care Update Date: 02/09/2017   Time: 2:05 PM    Patient Name: Donald Calderon      Medical Record Number: [id number]  Date of Birth: [birth year] Sex: Male         Room/Bed: 4W10C/4W10C-01 Payor Info: Payor: [employer] MEDICARE / Plan: [employer] MEDICARE HMO/PPO / Product Type: *No Product type* /    Admitting Diagnosis: [place] Stenosis  Admit Date/Time:  01/26/2017  5:38 PM Admission Comments: No comment available   Primary Diagnosis:  Lumbar stenosis Principal Problem: Lumbar stenosis  Patient Active Problem List   Diagnosis Date Noted  . Urine retention 01/29/2017  . 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 Lumbar stenosis 01/26/2017  . Diabetes mellitus type 2 in obese ([place])   . Benign essential HTN   . ETOH abuse   . Chronic pain syndrome   . Hyponatremia   . Leukocytosis   . Acute blood loss anemia   . Epidural hematoma ([place]) 01/23/2017  . Status post lumbar surgery 01/21/2017  . Status post lumbar spine operation 01/21/2017  . Back pain, acute 09/27/2011  . NEUROPATHY 08/08/2010  . SWELLING MASS OR LUMP IN HEAD AND NECK 06/27/2010  . BENIGN PROSTATIC HYPERTROPHY, WITH URINARY OBSTRUCTION 06/19/2010  . PERIPHERAL NEUROPATHY 05/12/2010  . HYPOKALEMIA 03/18/2010  . 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 DIABETES MELLITUS, TYPE II 02/07/2010  . HYPERLIPIDEMIA 02/07/2010  . ALCOHOL ABUSE 02/07/2010  . HYPERTENSION 02/07/2010  . SPINAL STENOSIS, LUMBAR 02/07/2010    Expected Discharge Date: Expected Discharge Date: 02/12/17  Team Members Present: Physician leading conference: Dr. Alger Simons Social Worker Present: Alfonse Alpers, LCSW Nurse Present: Dorien Chihuahua, RN PT Present: Kem Parkinson, PT OT Present: Willeen Cass, OT;Roanna Epley, COTA SLP Present: Charolett Bumpers, SLP PPS Coordinator present : Daiva Nakayama, RN, [facility]     Current Status/Progress Goal Weekly Team Focus  Medical   bowel and bladder still inconsistent in emptying,  hemorrhoids quite painful  see prior,   hemorrhoid control, bowel and bladder training   Bowel/Bladder   Continent of bowel but unable to urinate and self cath. every 4 to 6 hours, has large hemorrhoids treated with Anusol cream, on Flomax for bladder  To be able to urinate and be hemorrhoids free  To continue to Flomax and help patient with bladder training every shift   Swallow/Nutrition/ Hydration             ADL's   bathing/dressing-steady A with standing; functional transfers-steady A; standing balance-steady A  overall supervision  functional tranfsers, standing balance, ongoing education, activity tolerance   Mobility   supervision overall with RW; min assist with stairs (weight shift with ascend)   Supervision with RW; mod I for bed mobility and dynamic sitting balance  stair training, gait training (decreased UE support), static balance   Communication             Safety/Cognition/ Behavioral Observations  Patient is alert and oriented x4 and aware precautions and fall preventions  To continue with saferty precautions and fall preventions, no fall or injury this admission  To continue safety precautions and fall preventions measures   Pain   Occasional pain in the back  Acceptable pain scale 2/10  Monitor for pain every shift and PRN   Skin    sutures in lower back incision dry and intact, no drainage or redness noted  Maintain skin integrity and keep skin free of brakdown or infection  Assess skin every shift and PRN  Rehab Goals Patient on target to meet rehab goals: Yes Rehab Goals Revised: none *See Care Plan and progress notes for long and short-term goals.     Barriers to Discharge  Current Status/Progress Possible Resolutions Date Resolved   Physician    Medical stability        i/o cath training, regulation of stools, pain mgt      Nursing  Medical stability;Wound Care;Home environment access/layout;Incontinence;Medication compliance               PT                     OT                  SLP                SW                Discharge Planning/Teaching Needs:  Plan home with wife who can provide supervision to light assistance.  wife to come in for PT and RN education    Team Discussion:  Pt doing better with pain, hemorrhoids, and bowels.  Still needs to cath 1-2x/day.  Pt is on track for d/c per Dr. Naaman Plummer.  Pt is supervision overall and min assist on stairs.  Son has been here for family training and wife has received education with OT.  PT would like family education session with wife and CSW to arrange.    Revisions to Treatment Plan:  none    Continued Need for Acute Rehabilitation Level of Care: The patient requires daily medical management by a physician with specialized training in physical medicine and rehabilitation for the following conditions: Daily direction of a multidisciplinary physical rehabilitation program to ensure safe treatment while eliciting the highest outcome that is of practical value to the patient.: Yes Daily medical management of patient stability for increased activity during participation in an intensive rehabilitation regime.: Yes Daily analysis of laboratory values and/or radiology reports with any subsequent need for medication adjustment of medical intervention for : Post surgical problems;Neurological problems  Yesenia Locurto, Silvestre Mesi 02/10/2017, 11:02 AM

## 2017-02-10 NOTE — Progress Notes (Signed)
Social Work Patient ID: Donald Calderon, male   DOB: 06-07-1941, 76 y.o.   MRN: [id number]   CSW met with pt and talked with his wife via telephone to update them on team conference.  Pt/wife are agreeable with 02-12-17 d/c date.  Wife to come at [place] tomorrow (02-11-17) for family education with PT.  CSW alerted bedside RN that wife would be here if any training needed to occur with her.  She stated that pt is doing well with self-cathing.  CSW to arrange home health.  Pt has walker and BSC at home already and son is getting a transport chair.  CSW remains available as needed.

## 2017-02-10 NOTE — Plan of Care (Signed)
Problem: SCI BOWEL ELIMINATION Goal: RH STG MANAGE BOWEL WITH ASSISTANCE STG Manage Bowel with min Assistance.   Outcome: Progressing Uses toilet with minimum assistance  Problem: SCI BLADDER ELIMINATION Goal: RH STG MANAGE BLADDER WITH MEDICATION WITH ASSISTANCE STG Manage Bladder With Medication With min Assistance.   Outcome: Progressing Mild progression, self cath and was catheterized once, he also urinated once in the urinal with 250 ml clear, yellow urine with odor   Problem: RH SKIN INTEGRITY Goal: RH STG SKIN FREE OF INFECTION/BREAKDOWN Incision without infection, pt/cg perform incision care with min assist  Outcome: Progressing Back incision healing well without infection, sutures dry and intact to lower back incision   Problem: RH PAIN MANAGEMENT Goal: RH STG PAIN MANAGED AT OR BELOW PT'S PAIN GOAL < 4 with min assist.    Outcome: Progressing No complaints of pain this shift

## 2017-02-11 ENCOUNTER — Inpatient Hospital Stay (HOSPITAL_COMMUNITY): Payer: Medicare HMO

## 2017-02-11 ENCOUNTER — Inpatient Hospital Stay (HOSPITAL_COMMUNITY): Payer: Medicare HMO | Admitting: Occupational Therapy

## 2017-02-11 ENCOUNTER — Inpatient Hospital Stay (HOSPITAL_COMMUNITY): Payer: Medicare HMO | Admitting: Physical Therapy

## 2017-02-11 LAB — GLUCOSE, CAPILLARY
GLUCOSE-CAPILLARY: 106 mg/dL — AB (ref 65–99)
GLUCOSE-CAPILLARY: 135 mg/dL — AB (ref 65–99)
Glucose-Capillary: 86 mg/dL (ref 65–99)
Glucose-Capillary: 114 mg/dL — ABNORMAL HIGH (ref 65–99)

## 2017-02-11 MED ORDER — FLEET ENEMA 7-19 GM/118ML RE ENEM
1.0000 | ENEMA | Freq: Every day | RECTAL | Status: DC | PRN
  Administered 2017-02-11: 1 via RECTAL
  Filled 2017-02-11: qty 1

## 2017-02-11 MED ORDER — ACETAMINOPHEN 325 MG PO TABS: 325.0000 mg | ORAL_TABLET | ORAL | Status: DC | PRN

## 2017-02-11 MED ORDER — HYDROCODONE-ACETAMINOPHEN 10-325 MG PO TABS
1.0000 | ORAL_TABLET | Freq: Four times a day (QID) | ORAL | Status: DC | PRN
  Administered 2017-02-12: 1 via ORAL
  Filled 2017-02-11: qty 1

## 2017-02-11 MED ORDER — POLYETHYLENE GLYCOL 3350 17 G PO PACK
17.0000 g | PACK | Freq: Three times a day (TID) | ORAL | Status: DC
  Administered 2017-02-11 – 2017-02-12 (×2): 17 g via ORAL
  Filled 2017-02-11 (×2): qty 1

## 2017-02-11 NOTE — Progress Notes (Signed)
[facility] PHYSICAL MEDICINE & REHABILITATION     PROGRESS NOTE    Subjective/Complaints: Had a good night. No new problems  ROS: pt denies nausea, vomiting, diarrhea, cough, shortness of breath or chest pain      Objective: Vital Signs: Blood pressure 118/72, pulse 74, temperature 97.9 F (36.6 C), temperature source Oral, resp. rate 18, height 5\' 9"  (1.753 m), weight 90.4 kg (199 lb 3.2 oz), SpO2 100 %. No results found.  Recent Labs  02/09/17 0514  WBC 7.3  HGB 10.0*  HCT 30.2*  PLT 277    Recent Labs  02/09/17 0514  NA 131*  K 3.5  CL 96*  GLUCOSE 119*  BUN 13  CREATININE 0.96  CALCIUM 8.9   CBG (last 3)   Recent Labs  02/10/17 1705 02/10/17 2044 02/11/17 0652  GLUCAP 129* 152* 86    Wt Readings from Last 3 Encounters:  01/28/17 90.4 kg (199 lb 3.2 oz)  01/22/17 94.3 kg (208 lb)  01/15/17 94.5 kg (208 lb 6.4 oz)    Physical Exam:  Constitutional: He is oriented to person, place, and time. He appears well-developedand well-nourished. No distress.  HENT:  Head: Normocephalicand atraumatic.  Mouth/Throat: Oropharynx is clear and moist.  Eyes: Pupils are equal, round, and reactive to light. Conjunctivaeand EOMare normal.  Neck: Normal range of motion. Neck supple.  Cardiovascular: RRR without murmur. No JVD           Respiratory: CTA Bilaterally without wheezes or rales. Normal effort        GI: NT/ND, hems not visualized.  Musculoskeletal: He exhibits minimal edemaor tenderness.  Neurological: He is alertand oriented to person, place, and time.  Speech clear. Able to follow commands without difficulty.  Decreased LT/PP in distal legs.  Skin: Skin is warmand dry.  Back wound cdi, stable Psychiatric: He has a normal mood and affect. His behavior is normal. Judgmentand thought contentnormal.   Assessment/Plan: 1. Functional deficits secondary to lumbar stenosis/neurogenic claudication which require 3+ hours per day of  interdisciplinary therapy in a comprehensive inpatient rehab setting. Physiatrist is providing close team supervision and 24 hour management of active medical problems listed below. Physiatrist and rehab team continue to assess barriers to discharge/monitor patient progress toward functional and medical goals.  Function:  Bathing Bathing position   Position: Shower  Bathing parts Body parts bathed by patient: Right arm, Left arm, Chest, Abdomen, Front perineal area, Right upper leg, Left upper leg, Right lower leg, Left lower leg, Buttocks Body parts bathed by helper: Buttocks  Bathing assist Assist Level: Supervision or verbal cues      Upper Body Dressing/Undressing Upper body dressing   What is the patient wearing?: Pull over shirt/dress, Orthosis     Pull over shirt/dress - Perfomed by patient: Thread/unthread right sleeve, Thread/unthread left sleeve, Put head through opening, Pull shirt over trunk       Orthosis activity level: Performed by patient  Upper body assist Assist Level: Supervision or verbal cues      Lower Body Dressing/Undressing Lower body dressing   What is the patient wearing?: Pants, Non-skid slipper socks Underwear - Performed by patient: Thread/unthread right underwear leg, Thread/unthread left underwear leg, Pull underwear up/down Underwear - Performed by helper: Pull underwear up/down Pants- Performed by patient: Thread/unthread right pants leg, Pull pants up/down, Thread/unthread left pants leg Pants- Performed by helper: Thread/unthread left pants leg Non-skid slipper socks- Performed by patient: Don/doff right sock, Don/doff left sock Non-skid slipper socks- Performed by  helper: Don/doff right sock Socks - Performed by patient: Don/doff right sock, Don/doff left sock   Shoes - Performed by patient: Don/doff left shoe Shoes - Performed by helper: Don/doff right shoe, Don/doff left shoe          Lower body assist Assist for lower body dressing:  Touching or steadying assistance (Pt > 75%)      Toileting Toileting   Toileting steps completed by patient: Adjust clothing after toileting, Adjust clothing prior to toileting Toileting steps completed by helper: Adjust clothing after toileting Toileting Assistive Devices: Other (comment)  Toileting assist Assist level: No help/no cues   Transfers Chair/bed transfer   Chair/bed transfer method: Ambulatory Chair/bed transfer assist level: Supervision or verbal cues Chair/bed transfer assistive device: Walker, Air cabin crew     Max distance: 150 Assist level: Supervision or verbal cues   Wheelchair Wheelchair activity did not occur: Safety/medical concerns Type: Manual Max wheelchair distance: 150 Assist Level: No help, No cues, assistive device, takes more than reasonable amount of time  Cognition Comprehension Comprehension assist level: Follows basic conversation/direction with no assist  Expression Expression assist level: Expresses basic needs/ideas: With no assist  Social Interaction Social Interaction assist level: Interacts appropriately with others - No medications needed.  Problem Solving Problem solving assist level: Solves basic problems with no assist  Memory Memory assist level: Recognizes or recalls 90% of the time/requires cueing < 10% of the time   Medical Problem List and Plan:  1. Functional deficitssecondary to lumbar stenosis with neurogenic claudication -continue CIR therapies  -finalizing dc planning for tomorrow 2. DVT Prophylaxis/Anticoagulation: Mechanical: Sequential compression devices, below knee Bilateral lower extremities 3. Pain Management: Hydrocodone prn for back   -continue low dose gabapentin for neuropathic pain 4. Mood: LCSW to follow for evaluation and support.  5. Neuropsych: This patient iscapable of making decisions on notown behalf. 6. Skin/Wound Care: Monitor wound daily for healing.  7.  Fluids/Electrolytes/Nutrition: Monitor I/O.               -sodium up to 131 on 8/28 8. HTN: Monitor BP bid with orthostatic vitals. Hold parameters on Norvasc, HCTZ and prinivil.  9. T2DM: reasonable control at present. Continue Levemir bid with SSI for elevated BS--po intake reasonable. Titrate upwards as indicated. 10. BPH s/p TURP:improved emptying but still req I/O cath at times           - Continue flomax-- BID           -UA neg, ucx neg also            -needs to be OOB to void   -continue urecholine 25mg  tid  -discussed cathing at home with patient. o  -outpt urology follow up  11. Constipation/hemorrhoids: improved  -stools soft/regular          -continue regular soft/lax  -continue sitz bath, add anusol cream, situational mgt   12. Post-op anemia   LOS (Days) 16 A FACE TO FACE EVALUATION WAS PERFORMED  Alger Simons T, MD 02/11/2017 9:10 AM

## 2017-02-11 NOTE — Progress Notes (Signed)
Physical Therapy Discharge Summary  Patient Details  Name: Donald Calderon MRN: [id number] Date of Birth: 06-Aug-1940  Today's Date: 02/11/2017 PT Individual Time: 1000-1100 PT Individual Time Calculation (min): 60 min    Patient has met 6 of 8 long term goals due to improved activity tolerance, improved balance, improved postural control, increased strength, decreased pain, improved awareness and improved coordination.  Patient to discharge at an ambulatory level Supervision.   Patient's care partner is independent to provide the necessary physical assistance at discharge.  Reasons goals not met: Pt requires verbal cueing to maintain back precautions with bed mobility. Pt inconsistently min assist/supervision with getting into bed for LE management. Pt requires min assist for weight shift during ascend of stairs to limit twisting of back. Pt only has 2 stairs to enter home. Pt wife able to provide necessary assistance with these task.   Recommendation:  Patient will benefit from ongoing skilled PT services in outpatient setting to continue to advance safe functional mobility, address ongoing impairments in balance, gait, strength, safety awareness, endurance, and minimize fall risk.  Equipment: Rollator  Reasons for discharge: treatment goals met or adequate for discharge  Patient/family agrees with progress made and goals achieved: Yes  PT Discharge Precautions/Restrictions Precautions Precautions: Fall Restrictions Weight Bearing Restrictions: No Pain Pain Assessment Pain Assessment: No/denies pain Pain Score: 2  Faces Pain Scale: Hurts a little bit Vision/Perception  Vision - Assessment Additional Comments: no visual deficits effecting mobility  Cognition Orientation Level: Oriented X4 Sensation Sensation Light Touch: Appears Intact Light Touch Impaired Details: Impaired RLE (Sensation decreased on RLE) Proprioception: Impaired Detail Proprioception Impaired Details:  Impaired LLE;Impaired RLE (Inconsistantly able to detect hallux position) Coordination Heel Shin Test: Pt able to perform quickly but through limited ROM Motor  Motor Motor: Motor apraxia Motor - Discharge Observations: Motor apraxia, LE weakness  Mobility Bed Mobility Bed Mobility: Rolling Right;Rolling Left;Sit to Supine;Supine to Sit Rolling Right: 5: Supervision Rolling Right Details: Verbal cues for precautions/safety Rolling Left: 5: Supervision Rolling Left Details: Verbal cues for precautions/safety Right Sidelying to Sit: 5: Supervision Right Sidelying to Sit Details: Verbal cues for precautions/safety Supine to Sit: 5: Supervision Sit to Supine: 4: Min assist (LE managment; ) Sit to Supine - Details: Manual facilitation for placement;Verbal cues for precautions/safety Transfers Transfers: Yes Sit to Stand: 5: Supervision Sit to Stand Details: Other (comment) (no cues) Stand to Sit: 5: Supervision Stand to Sit Details (indicate cue type and reason): Other (comment) (no cues ) Stand Pivot Transfers: 5: Supervision Stand Pivot Transfer Details: Other (comment) (no cues ) Locomotion  Ambulation Ambulation: Yes Ambulation/Gait Assistance: 5: Supervision Ambulation Distance (Feet): 150 Feet Assistive device: Rollator Ambulation/Gait Assistance Details: Verbal cues for precautions/safety Gait Gait: Yes Gait Pattern: Impaired Gait Pattern: Lateral hip instability;Narrow base of support (knee instability) Gait velocity: 1.05 ft/sec Stairs / Additional Locomotion Stairs: Yes Stairs Assistance: 4: Min assist Stairs Assistance Details: Manual facilitation for weight shifting;Verbal cues for precautions/safety Stairs Assistance Details (indicate cue type and reason): Min assist for weight shift on ascend  Stair Management Technique: Two rails Number of Stairs: 4 Height of Stairs: 6 Ramp: 5: Supervision Curb: 4: Min assist Veterinary surgeon ) Designer, industrial/product Mobility: Yes Wheelchair Assistance: 6: Modified independent (Device/Increase time) Wheelchair Assistance Details: Other (comment) (no cues) Wheelchair Propulsion: Both upper extremities Wheelchair Parts Management: Needs assistance Distance: 15 ft (around room )  Trunk/Postural Assessment  Cervical Assessment Cervical Assessment: Within Functional Limits Thoracic Assessment Thoracic Assessment: Within Functional Limits  Lumbar Assessment Lumbar Assessment: Exceptions to [facility] (lumbar brace) Postural Control Postural Control: Within Functional Limits  Balance Standardized Balance Assessment Standardized Balance Assessment: Timed Up and Go Test Timed Up and Go Test TUG: Normal TUG Normal TUG (seconds): 63 Static Sitting Balance Static Sitting - Balance Support: Feet supported Static Sitting - Level of Assistance: 6: Modified independent (Device/Increase time) Dynamic Sitting Balance Dynamic Sitting - Balance Support: Feet supported Dynamic Sitting - Level of Assistance: 6: Modified independent (Device/Increase time) Dynamic Sitting - Balance Activities: Reaching for objects;Forward lean/weight shifting Static Standing Balance Static Standing - Balance Support: Bilateral upper extremity supported Static Standing - Level of Assistance: 5: Stand by assistance Dynamic Standing Balance Dynamic Standing - Balance Support: Bilateral upper extremity supported Dynamic Standing - Level of Assistance: 5: Stand by assistance Dynamic Standing - Balance Activities: Forward lean/weight shifting;Reaching for objects Extremity Assessment  RUE Assessment RUE Assessment: Within Functional Limits LUE Assessment LUE Assessment: Within Functional Limits RLE Assessment RLE Assessment: Exceptions to [facility] RLE Strength Right Hip Flexion: 4-/5 Right Hip ABduction: 3+/5 Right Hip ADduction: 4/5 Right Knee Flexion: 3+/5 Right Knee Extension: 4/5 Right Ankle Dorsiflexion:  3-/5 Right Ankle Plantar Flexion: 3+/5 LLE Assessment LLE Assessment: Exceptions to [facility] LLE Strength Left Hip Flexion: 4-/5 Left Hip ABduction: 3+/5 Left Hip ADduction: 4/5 Left Knee Flexion: 3+/5 Left Knee Extension: 4/5 Left Ankle Dorsiflexion: 3-/5 Left Ankle Plantar Flexion: 3+/5    Skilled therapy Intervention Pt received sitting up in wheelchair, wife present, agreeable to therapy. Wife & pt educated throughout session on proper guarding, safety precautions, and energy conservation. Pt performed sit> supine with min assist for LE management. Pt supine>sit with supervision. Verbal cues for log roll technique. Pt ambulated 150 ft to gym with rollator and supervision. Pt performed car transfer with supervision and rollator. Pt ambulated up ramp and across uneven ground with supervision and rollator. Pt managed curb with rollator and min assist for rollator management. Wife demonstrated ability to provide rollator support. Pt navigated up/down 4 stairs with 2 rails x 2 with min assist for weight shift on ascend. Wife demonstrated ability to provide min assist on stairs. Pt ambulated 30 ft x 2 with rollator & supervision to determine gait speed. Pt performed TUG, scores listed below, indicating high fall risk. Score discussed with pt and pt wife. Pt returned to room total assist. Pt left sitting up in wheelchair, all needs in reach, wife present.     See Function Navigator for Current Functional Status.  Gwinda Passe, SPT 02/11/2017, 12:22 PM

## 2017-02-11 NOTE — Progress Notes (Signed)
Recreational Therapy Discharge Summary Patient Details  Name: Donald Calderon MRN: [id number] Date of Birth: Jan 24, 1941 Today's Date: 02/11/2017 Comments on progress toward goals: Pt has made good progress during LOS and is ready for discharge home with family at supervision level.  TR session focused on activity analysis with potential modifications, home safety, energy conservation & community pursuits.  Goals met.  Reasons for discharge: discharge from hospital  Patient/family agrees with progress made and goals achieved: Yes  [name] 02/11/2017, 8:22 AM

## 2017-02-11 NOTE — Progress Notes (Signed)
Occupational Therapy Discharge Summary  Patient Details  Name: Donald Calderon MRN: [id number] Date of Birth: [birth year]  Patient has met 8 of 8 long term goals due to improved activity tolerance, improved balance, ability to compensate for deficits and improved coordination. Pt made steady progress with BADLs during this admission and is discharging home at supervision level for bathing at shower level and dressing with sit<>stand from chair, and all functional transfers with Rollator/RW. PT's wife and son have been present for therapy and have demonstrated appropriate level of supervision.  Pt continues to fatigue quickly but employs appropriate energy conservation strategies.  Patient to discharge at overall Supervision level.  Patient's care partner is independent to provide the necessary physical and cognitive assistance at discharge.     Recommendation:  Patient will benefit from ongoing skilled OT services in home health setting to continue to advance functional skills in the area of BADL and iADL.  Equipment: pt owns [facility]; son purchasing tub bench  Reasons for discharge: treatment goals met  Patient/family agrees with progress made and goals achieved: Yes  OT Discharge Vision Baseline Vision/History: Wears glasses;Glaucoma Wears Glasses: Reading only Patient Visual Report: No change from baseline Additional Comments: no visual deficits effecting mobility Perception  Perception: Within Functional Limits Praxis Praxis: Intact Cognition Overall Cognitive Status: Within Functional Limits for tasks assessed Arousal/Alertness: Awake/alert Orientation Level: Oriented X4 Attention: Alternating Selective Attention: Appears intact Alternating Attention: Appears intact Memory: Appears intact Awareness: Appears intact Problem Solving: Appears intact Safety/Judgment: Appears intact Sensation Sensation Light Touch: Appears Intact Light Touch Impaired Details: Impaired RLE (Sensation  decreased on RLE) Stereognosis: Appears Intact Hot/Cold: Appears Intact Proprioception: Appears Intact Proprioception Impaired Details: Impaired LLE;Impaired RLE (Inconsistantly able to detect hallux position) Coordination Gross Motor Movements are Fluid and Coordinated: Yes Fine Motor Movements are Fluid and Coordinated: Yes Heel Shin Test: Pt able to perform quickly but through limited ROM Motor  Motor Motor: Motor apraxia Motor - Discharge Observations: Motor apraxia, LE weakness Mobility  Bed Mobility Bed Mobility: Rolling Right;Rolling Left;Sit to Supine;Supine to Sit Rolling Right: 5: Supervision Rolling Right Details: Verbal cues for precautions/safety Rolling Left: 5: Supervision Rolling Left Details: Verbal cues for precautions/safety Right Sidelying to Sit: 5: Supervision Right Sidelying to Sit Details: Verbal cues for precautions/safety Supine to Sit: 5: Supervision Sit to Supine: 4: Min assist (LE managment; ) Sit to Supine - Details: Manual facilitation for placement;Verbal cues for precautions/safety Transfers Sit to Stand: 5: Supervision Sit to Stand Details: Other (comment) (no cues) Stand to Sit: 5: Supervision Stand to Sit Details (indicate cue type and reason): Other (comment) (no cues )  Trunk/Postural Assessment  Cervical Assessment Cervical Assessment: Within Functional Limits Thoracic Assessment Thoracic Assessment: Within Functional Limits Lumbar Assessment Lumbar Assessment: Exceptions to WFL (LSO brace) Postural Control Postural Control: Within Functional Limits  Balance Standardized Balance Assessment Standardized Balance Assessment: Timed Up and Go Test Timed Up and Go Test TUG: Normal TUG Normal TUG (seconds): 63 Static Sitting Balance Static Sitting - Balance Support: Feet supported Static Sitting - Level of Assistance: 6: Modified independent (Device/Increase time) Dynamic Sitting Balance Dynamic Sitting - Balance Support: Feet  supported Dynamic Sitting - Level of Assistance: 6: Modified independent (Device/Increase time) Dynamic Sitting - Balance Activities: Reaching for objects;Forward lean/weight shifting Static Standing Balance Static Standing - Balance Support: Bilateral upper extremity supported Static Standing - Level of Assistance: 5: Stand by assistance Dynamic Standing Balance Dynamic Standing - Balance Support: Bilateral upper extremity supported Dynamic Standing - Level of  Assistance: 5: Stand by assistance Dynamic Standing - Balance Activities: Forward lean/weight shifting;Reaching for objects Extremity/Trunk Assessment RUE Assessment RUE Assessment: Within Functional Limits LUE Assessment LUE Assessment: Within Functional Limits   See Function Navigator for Current Functional Status.  Leotis Shames [facility] 02/11/2017, 2:42 PM

## 2017-02-11 NOTE — Progress Notes (Signed)
    Subjective:    Patient reports pain as 0 on 0-10 scale.  Low back pain Denies CP or SOB.  Pt reports improvement in voiding. Positive flatus. Pt reports some constipation  Objective: Vital signs in last 24 hours: Temp:  [97.9 F (36.6 C)-98.2 F (36.8 C)] 97.9 F (36.6 C) (08/30 0531) Pulse Rate:  [74-75] 74 (08/30 0531) Resp:  [17-18] 18 (08/30 0531) BP: (118-128)/(66-72) 118/72 (08/30 0531) SpO2:  [100 %] 100 % (08/30 0531)  Intake/Output from previous day: 08/29 0701 - 08/30 0700 In: 822 [P.O.:822] Out: 2300 [Urine:2300] Intake/Output this shift: Total I/O In: -  Out: 800 [Urine:800]  Labs:  Recent Labs  02/09/17 0514  HGB 10.0*    Recent Labs  02/09/17 0514  WBC 7.3  RBC 3.41*  HCT 30.2*  PLT 277    Recent Labs  02/09/17 0514  NA 131*  K 3.5  CL 96*  CO2 28  BUN 13  CREATININE 0.96  GLUCOSE 119*  CALCIUM 8.9   No results for input(s): LABPT, INR in the last 72 hours.  Physical Exam: Neurologically intact ABD soft Sensation intact distally Dorsiflexion/Plantar flexion intact Incision: no drainage Compartment soft  Assessment/Plan:    Removed the pts Lumbar sutures Continue care in rehab Pt will report to clinic 2 weeks after DC Pt reports may go home tomorrow  [place], [name] for Dr. Melina Schools [facility] [phone] 02/11/2017, 1:32 PM    Patient ID: Donald Calderon, male   DOB: 01/14/1941, 76 y.o.   MRN: [id number]

## 2017-02-11 NOTE — Progress Notes (Signed)
Occupational Therapy Session Note  Patient Details  Name: Donald Calderon MRN: [id number] Date of Birth: [birth year]  Today's Date: 02/11/2017 OT Individual Time: 1345-1457 OT Individual Time Calculation (min): 72 min    Short Term Goals: Week 2:  OT Short Term Goal 1 (Week 2): STG=LTG secondary to ELOS  Skilled Therapeutic Interventions/Progress Updates:    Upon entering the room, pt seated in wheelchair awaiting therapist. Pt verbalizing need to toilet and ambulated with rollator to bathroom with overall supervision. Pt performed clothing management, hygiene, and transfer with supervision. Pt having successful BM this session. Pt returned to sink to wash hands while standing and then taking seated rest break secondary to fatigue. Pt ambulating 100' to gym with rollator and supervision. Pt engaged in table top activity to address balance with and without UE support. Pt standing for 8.5 minutes without LOB. When standing without UE support pt needing close supervision - steady assistance. Pt taking seated rest break and therapist educating pt on energy conservation techniques once returning home. OT also provided education regarding follow up therapy expectations. Pt verbalized understanding and asking questions as needed. Pt returning to room in same manner. Pt returning to bed. Bed alarm activated. Call bell and all needed items within reach upon exiting the room.   Therapy Documentation Precautions:  Precautions Precautions: Fall Precaution Comments: Pt is able to report 3/3 back precautions  Required Braces or Orthoses: Spinal Brace Spinal Brace: Lumbar corset, Applied in sitting position Restrictions Weight Bearing Restrictions: No Other Position/Activity Restrictions: At all times General:   Vital Signs: Therapy Vitals Temp: 98.2 F (36.8 C) Temp Source: Oral Pulse Rate: 74 Resp: 20 BP: (!) 121/59 Patient Position (if appropriate): Lying Oxygen Therapy SpO2: 100 % O2 Device:  Not Delivered Pain:   ADL: ADL ADL Comments: Please see functional navigator  Vision Baseline Vision/History: Wears glasses;Glaucoma Wears Glasses: Reading only Patient Visual Report: No change from baseline Perception  Perception: Within Functional Limits Praxis Praxis: Intact Exercises:   Other Treatments:    See Function Navigator for Current Functional Status.   Therapy/Group: Individual Therapy  Gypsy Decant 02/11/2017, 4:55 PM

## 2017-02-11 NOTE — Progress Notes (Signed)
Occupational Therapy Session Note  Patient Details  Name: Donald Calderon MRN: [id number] Date of Birth: Nov 29, 1940  Today's Date: 02/11/2017 OT Individual Time: [phone] OT Individual Time Calculation (min): 58 min    Short Term Goals: Week 2:  OT Short Term Goal 1 (Week 2): STG=LTG secondary to ELOS  Skilled Therapeutic Interventions/Progress Updates:    Pt resting in w/c upon arrival with wife present.  Pt engaged in ongoing BADL retraining with wife observing.  Pt amb with RW to bathroom to use toilet prior to transferring to tub bench for shower.  Pt completed all bathing/dressing and functional transfers at supervision level.  Pt uses AE appropriately to assist with LB bathing/dressing tasks.  Pt able to cross legs this morning to don and fasten sandals.  Pt amb with rollator in hallway and continued to practice stopping and sitting on rollator.  Pt returned to room and remained in w/c with all needs within reach.  Pt pleased with progress and ready for discharge tomorrow.   Therapy Documentation Precautions:  Precautions Precautions: Fall, Back Precaution Comments: Pt is able to report 3/3 back precautions  Required Braces or Orthoses: Spinal Brace Spinal Brace: Lumbar corset, Applied in sitting position Restrictions Weight Bearing Restrictions: No Other Position/Activity Restrictions: At all times Pain:    See Function Navigator for Current Functional Status.   Therapy/Group: Individual Therapy  [name] 02/11/2017, 10:05 AM

## 2017-02-12 DIAGNOSIS — K9189 Other postprocedural complications and disorders of digestive system: Secondary | ICD-10-CM

## 2017-02-12 DIAGNOSIS — K567 Ileus, unspecified: Secondary | ICD-10-CM

## 2017-02-12 LAB — GLUCOSE, CAPILLARY: GLUCOSE-CAPILLARY: 102 mg/dL — AB (ref 65–99)

## 2017-02-12 MED ORDER — TRAMADOL HCL 50 MG PO TABS: 50.0000 mg | ORAL_TABLET | Freq: Four times a day (QID) | ORAL | 0 refills | Status: DC | PRN

## 2017-02-12 MED ORDER — HYDROCORTISONE 2.5 % RE CREA
TOPICAL_CREAM | Freq: Four times a day (QID) | RECTAL | 0 refills | Status: DC
Start: 2017-02-12 — End: 2020-12-01

## 2017-02-12 MED ORDER — LEVEMIR 100 UNIT/ML ~~LOC~~ SOLN: 10.0000 [IU] | Freq: Two times a day (BID) | SUBCUTANEOUS | 2 refills | Status: DC

## 2017-02-12 MED ORDER — HYDROCODONE-ACETAMINOPHEN 10-325 MG PO TABS: 0.5000 | ORAL_TABLET | Freq: Two times a day (BID) | ORAL | 0 refills | Status: DC | PRN

## 2017-02-12 MED ORDER — PRAVASTATIN SODIUM 10 MG PO TABS: 10.0000 mg | ORAL_TABLET | Freq: Every evening | ORAL | 0 refills | Status: AC

## 2017-02-12 MED ORDER — GABAPENTIN 100 MG PO CAPS: 100.0000 mg | ORAL_CAPSULE | Freq: Three times a day (TID) | ORAL | 0 refills | Status: DC

## 2017-02-12 MED ORDER — TAMSULOSIN HCL 0.4 MG PO CAPS: 0.4000 mg | ORAL_CAPSULE | Freq: Two times a day (BID) | ORAL | 0 refills | Status: DC

## 2017-02-12 MED ORDER — CYCLOBENZAPRINE HCL 5 MG PO TABS: 5.0000 mg | ORAL_TABLET | Freq: Three times a day (TID) | ORAL | 0 refills | Status: DC | PRN

## 2017-02-12 MED ORDER — LIDOCAINE HCL 2 % EX GEL: CUTANEOUS | 0 refills | Status: DC | PRN

## 2017-02-12 MED ORDER — BETHANECHOL CHLORIDE 25 MG PO TABS: 25.0000 mg | ORAL_TABLET | Freq: Three times a day (TID) | ORAL | 1 refills | Status: DC

## 2017-02-12 MED ORDER — AMLODIPINE BESYLATE 10 MG PO TABS: 10.0000 mg | ORAL_TABLET | Freq: Every day | ORAL | 0 refills | Status: DC

## 2017-02-12 MED ORDER — LISINOPRIL 20 MG PO TABS: 20.0000 mg | ORAL_TABLET | Freq: Every day | ORAL | 0 refills | Status: DC

## 2017-02-12 MED ORDER — HYDROCORTISONE 2.5 % RE CREA: TOPICAL_CREAM | Freq: Four times a day (QID) | RECTAL | 0 refills | Status: DC

## 2017-02-12 MED ORDER — POLYETHYLENE GLYCOL 3350 17 G PO PACK: 17.0000 g | PACK | Freq: Three times a day (TID) | ORAL | 0 refills | Status: DC

## 2017-02-12 MED ORDER — HYDROCODONE-ACETAMINOPHEN 10-325 MG PO TABS: 2.5000 | ORAL_TABLET | Freq: Two times a day (BID) | ORAL | 0 refills | Status: DC | PRN

## 2017-02-12 NOTE — Progress Notes (Signed)
[place] PHYSICAL MEDICINE & REHABILITATION     PROGRESS NOTE    Subjective/Complaints: Doing well. Excite to go home. No problems over night  ROS: pt denies nausea, vomiting, diarrhea, cough, shortness of breath or chest pain      Objective: Vital Signs: Blood pressure 123/65, pulse 67, temperature 98.2 F (36.8 C), temperature source Oral, resp. rate 20, height 5\' 9"  (1.753 m), weight 90.4 kg (199 lb 3.2 oz), SpO2 100 %. No results found. No results for input(s): WBC, HGB, HCT, PLT in the last 72 hours. No results for input(s): NA, K, CL, GLUCOSE, BUN, CREATININE, CALCIUM in the last 72 hours.  Invalid input(s): CO CBG (last 3)   Recent Labs  02/11/17 1705 02/11/17 2104 02/12/17 0629  GLUCAP 106* 135* 102*    Wt Readings from Last 3 Encounters:  01/28/17 90.4 kg (199 lb 3.2 oz)  01/22/17 94.3 kg (208 lb)  01/15/17 94.5 kg (208 lb 6.4 oz)    Physical Exam:  Constitutional: He is oriented to person, place, and time. He appears well-developedand well-nourished. No distress.  HENT:  Head: Normocephalicand atraumatic.  Mouth/Throat: Oropharynx is clear and moist.  Eyes: Pupils are equal, round, and reactive to light. Conjunctivaeand EOMare normal.  Neck: Normal range of motion. Neck supple.  Cardiovascular: RRR without murmur. No JVD           Respiratory: CTA Bilaterally without wheezes or rales. Normal effort        GI: NT/ND, hems not visualized.  Musculoskeletal: He exhibits minimal edemaor tenderness.  Neurological: He is alertand oriented to person, place, and time.  Speech clear. Able to follow commands without difficulty.  Decreased LT/PP in distal legs.  Skin: Skin is warmand dry.  Back wound cdi, stable, sutures out Psychiatric: He has a normal mood and affect. His behavior is normal. Judgmentand thought contentnormal.   Assessment/Plan: 1. 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 Physiatrist is providing close team supervision and 24 hour management of active medical problems listed below. Physiatrist and rehab team continue to assess barriers to discharge/monitor patient progress toward functional and medical goals.  Function:  Bathing Bathing position   Position: Shower  Bathing parts Body parts bathed by patient: Right arm, Left arm, Chest, Abdomen, Front perineal area, Right upper leg, Left upper leg, Right lower leg, Left lower leg, Buttocks Body parts bathed by helper: Buttocks  Bathing assist Assist Level: Supervision or verbal cues      Upper Body Dressing/Undressing Upper body dressing   What is the patient wearing?: Pull over shirt/dress, Orthosis     Pull over shirt/dress - Perfomed by patient: Thread/unthread right sleeve, Thread/unthread left sleeve, Put head through opening, Pull shirt over trunk       Orthosis activity level: Performed by patient  Upper body assist Assist Level: More than reasonable time      Lower Body Dressing/Undressing Lower body dressing   What is the patient wearing?: Underwear, Pants, Non-skid slipper socks, Shoes Underwear - Performed by patient: Thread/unthread right underwear leg, Thread/unthread left underwear leg, Pull underwear up/down Underwear - Performed by helper: Pull underwear up/down Pants- Performed by patient: Thread/unthread right pants leg, Pull pants up/down, Thread/unthread left pants leg Pants- Performed by helper: Thread/unthread left pants leg Non-skid slipper socks- Performed by patient: Don/doff right sock, Don/doff left sock Non-skid slipper socks- Performed by helper: Don/doff right sock Socks - Performed by patient:  Don/doff right sock, Don/doff left sock   Shoes - Performed by patient: Don/doff right shoe, Don/doff left shoe, Fasten right, Fasten left Shoes - Performed by helper: Don/doff right shoe, Don/doff  left shoe          Lower body assist Assist for lower body dressing: Supervision or verbal cues      Toileting Toileting Toileting activity did not occur: Safety/medical concerns Toileting steps completed by patient: Adjust clothing after toileting, Adjust clothing prior to toileting, Performs perineal hygiene Toileting steps completed by helper: Adjust clothing after toileting Toileting Assistive Devices: Other (comment)  Toileting assist Assist level: Supervision or verbal cues   Transfers Chair/bed transfer   Chair/bed transfer method: Ambulatory Chair/bed transfer assist level: Supervision or verbal cues Chair/bed transfer assistive device: Walker, Air cabin crew     Max distance: 150 Assist level: Supervision or verbal cues   Wheelchair Wheelchair activity did not occur: Safety/medical concerns Type: Manual Max wheelchair distance: 15 (around room) Assist Level: No help, No cues, assistive device, takes more than reasonable amount of time  Cognition Comprehension Comprehension assist level: Follows basic conversation/direction with no assist  Expression Expression assist level: Expresses complex ideas: With extra time/assistive device  Social Interaction Social Interaction assist level: Interacts appropriately with others - No medications needed.  Problem Solving Problem solving assist level: Solves complex problems: With extra time  Memory Memory assist level: More than reasonable amount of time   Medical Problem List and Plan:  1. Functional deficitssecondary to lumbar stenosis with neurogenic claudication -continue CIR therapies  -dc home today  -Patient to see Rehab MD in the office for transitional care encounter in 1-2 weeks.  2. DVT Prophylaxis/Anticoagulation: Mechanical: Sequential compression devices, below knee Bilateral lower extremities 3. Pain Management: Hydrocodone prn for back   -continue low dose gabapentin for  neuropathic pain upon dc 4. Mood: LCSW to follow for evaluation and support.  5. Neuropsych: This patient iscapable of making decisions on notown behalf. 6. Skin/Wound Care: sutures out. Wounds intact  7. Fluids/Electrolytes/Nutrition: Monitor I/O.               -sodium up to 131 on 8/28  8. HTN: Monitor BP bid with orthostatic vitals. Hold parameters on Norvasc, HCTZ and prinivil.  9. T2DM: reasonable control at present. Continue Levemir bid with SSI for elevated BS--po intake reasonable. Titrate upwards as indicated. 10. BPH s/p TURP:improved emptying but still req I/O cath at times           - Continue flomax-- BID           -UA neg, ucx neg also            -needs to be OOB to void   -continue urecholine 25mg  tid  -discussed cathing at home with patient. o  -outpt urology follow up is recommended  11. Constipation/hemorrhoids: improved  -keep stools soft/regular          -continue regular soft/lax  -anusol cream  12. Post-op anemia   LOS (Days) 17 A FACE TO FACE EVALUATION WAS PERFORMED  Meredith Staggers, MD 02/12/2017 9:20 AM

## 2017-02-12 NOTE — Discharge Instructions (Signed)
Inpatient Rehab Discharge Instructions  Donald Calderon Discharge date and time: 02/12/17   Activities/Precautions/ Functional Status: Activity: no lifting, driving, or strenuous exercise for till cleared by MD Diet: diabetic diet Wound Care: keep wound clean and dry. Contact MD if you develop any problems with your incision/wound--redness, swelling, increase in pain, drainage or if you develop fever or chills.    Functional status:  ___ No restrictions     ___ Walk up steps independently _X__ 24/7 supervision/assistance   ___ Walk up steps with assistance ___ Intermittent supervision/assistance  ___ Bathe/dress independently _X__ Walk with walker    _X__ Bathe/dress with assistance ___ Walk Independently    ___ Shower independently ___ Walk with assistance    ___ Shower with assistance _X__ No alcohol     ___ Return to work/school ________   Special Instructions: 1. Maintain back precautions. Wear brace when out of bed. 2. Continue to cath 4 times a day to keep bladder volumes less than 350 cc. 3. Can use enema or suppository if no BM for 2 days. Can also use milk of magnesia additionally.    COMMUNITY REFERRALS UPON DISCHARGE:   Home Health:   PT     OT     RN  Agency:  [facility] Phone:  [phone] Medical Equipment/Items Ordered:  Rollator  Agency/Supplier:  [facility]        Phone:  [phone]   Clean Intermittent Catheterization, Male Clean intermittent catheterization (CIC) is a procedure to remove urine from the bladder by placing a small, flexible tube (catheter) into the bladder though the urethra. The urethra is a tube in the body that carries urine from the bladder out of the body. CIC may be done when:  You cannot completely empty your bladder on your own. This may be due to a blockage in the bladder or urethra.  Your bladder leaks urine. This may happen when the muscles or nerves near the bladder are not working normally, so the bladder  overflows.  Your health care provider will show you how to perform CIC and will help you to feel comfortable performing this procedure at home. Your health care provider will also help you to get the home care supplies that are needed for this procedure. What supplies will I need?  Germ-free (sterile), water-based lubricant.  A container for urine collection. You may also use the toilet to dispose of urine from the catheter.  A catheter. Your health care provider will determine the best size for you.  Sterile gloves.  Sterile gauze.  Medicated sterile swabs. How do I perform the procedure? Most people need CIC at least 4 times per day to adequately empty the bladder. Your health care provider will tell you how often you should perform CIC. To perform CIC, follow these steps: 1. Wash your hands with soap and water. If soap and water are not available, use hand sanitizer. 2. Prepare the supplies that you will use during the procedure. Open the catheter pack, the lubricant, and the pack of medicated sterile swabs. If you have been told to keep the procedure sterile, do not touch your supplies until you are wearing gloves. 3. Get in a comfortable position. Possible positions include: ? Sitting on a toilet, a chair, or the edge of a bed. ? Standing near a toilet. ? Lying down with your head raised on pillows and your knees pointing to the ceiling. 4. If you are using a urine collection container, position  it between your legs. 5. Urinate, if you are able. 6. Put on gloves. 7. Apply lubricant to about 2 inches (5 cm) of the tip of the catheter. 8. Set the catheter down on a clean, dry surface within reach. 9. Gently stretch your penis out from your body. Pull back any skin that covers the end of your penis (foreskin). Clean the end of your peniswith medicated sterile swabs as told by your health care provider. 10. Hold your penis upward at a 45-60 degree angle. This helps to straighten the  urethra. 11. Slowly insert the lubricated catheter 2-3 inches (5-8 cm) straight into your urethra until urine flows freely. Allow urine to drain into the toilet or the urine collection container. 12. When urine starts to flow freely, insert the catheter 1 inch (3 cm) more. 13. When urine stops flowing, slowly remove the catheter. 14. Note the color, amount, and odor of the urine. 15. Clean your penis using soap and water. 37. Wash your hands with soap and water. 17. Follow package instructions about how to clean the catheter after each use.  What should I do at home? How Often Should I Perform CIC?  Do CIC to empty your bladder every 4-6 hours or as often as told by your health care provider.  If you have symptoms of too much urine in your bladder (overdistension) and you are not able to urinate, perform CIC. Symptoms of overdistension may include: ? Restlessness. ? Sweating or chills. ? Headache. ? Flushed or pale skin. ? Cold limbs. ? Bloated lower abdomen. What Are Some Steps That I Can Take to Avoid Problems?  Drink enough fluid to keep your urine clear or pale yellow.  Avoid caffeine. Caffeine may make you urinate more frequently and more urgently.  Dispose of a multiple use catheter when it becomes dry, brittle, or cloudy. This usually happens after you use the catheter for 1 week.  Take over-the-counter and prescription medicines only as told by your health care provider.  Keep all follow-up visits as told by your health care provider. This is important. Contact a health care provider if:  You have difficulty performing CIC.  You have urine leaking during CIC.  You have: ? Dark or cloudy urine. ? Blood in your urine or in your catheter. ? A change in the smell of your urine or discharge. ? A burning feeling while you urinate.  You feel nauseous or you vomit.  You have pain in your abdomen, your back, or your sides below your ribs.  You have swelling or redness  around the opening of your urethra.  You develop a rash or sores on your skin. Get help right away if:  You have a fever.  You have symptoms that do not go away after 3 days.  You have symptoms that suddenly get worse.  You have severe pain.  The amount of urine that drains from your bladder decreases. This information is not intended to replace advice given to you by your health care provider. Make sure you discuss any questions you have with your health care provider. Document Released: 07/04/2010 Document Revised: 11/07/2015 Document Reviewed: 12/14/2014 Elsevier Interactive Patient Education  2018 [employer].    My questions have been answered and I understand these instructions. I will adhere to these goals and the provided educational materials after my discharge from the hospital.  Patient/Caregiver Signature _______________________________ Date __________  Clinician Signature _______________________________________ Date __________  Please bring this form and your medication list  with you to all your follow-up doctor's appointments.

## 2017-02-12 NOTE — Progress Notes (Signed)
Social Work Discharge Note  The overall goal for the admission was met for:   Discharge location: Yes - home with wife to provide supervision  Length of Stay: Yes - 17 days  Discharge activity level: Yes - supervision  Home/community participation: Yes  Services provided included: MD, RD, PT, OT, RN, TR, Pharmacy and [place]: Private Insurance: [employer]  Follow-up services arranged: Home Health: PT/OT/RN, DME: rollator and Patient/Family request agency HH: [facility] [place], DME: [facility]  Comments (or additional information): Pt's wife and son have received family education.  Pt feels prepared to go home.  F/U services arranged and rollator ordered for pt for home use.   Patient/Family verbalized understanding of follow-up arrangements: Yes  Individual responsible for coordination of the follow-up plan: pt with his wife for support/supervision  Confirmed correct DME delivered: Trey Sailors 02/12/2017    Tyna Huertas, Silvestre Mesi

## 2017-02-12 NOTE — Discharge Summary (Signed)
Physician Discharge Summary  Patient ID: ENNIO HOUP MRN: [id number] DOB/AGE: 12-02-1940 76 y.o.  Admit date: 01/26/2017 Discharge date: 02/12/2017  Discharge Diagnoses:  Principal Problem:   Lumbar stenosis Active Problems:   Hereditary and idiopathic peripheral neuropathy   Diabetes mellitus type 2 in obese (HCC)   Benign essential HTN   Urine retention   Neurologic gait disorder   Postoperative ileus ([place])   Discharged Condition: stable.   Significant Diagnostic Studies: Dg Abd 1 View  Result Date: 01/31/2017 CLINICAL DATA:  Ileus.  Inpatient.  Recent lumbar spine surgery. EXAM: ABDOMEN - 1 VIEW COMPARISON:  01/26/2017 abdominal radiograph. FINDINGS: Posterior spinal fusion hardware overlies the lower lumbar spine. Mild gaseous distention of small bowel loops throughout the abdomen up to 3.2 cm diameter, not appreciably changed. Minimal colonic stool. No evidence of pneumatosis or pneumoperitoneum. No radiopaque nephrolithiasis. Clear lung bases. IMPRESSION: No appreciable change in mild diffuse gaseous distention of the small bowel, compatible with the provided history of adynamic ileus. Electronically Signed   By: Ilona Sorrel M.D.   On: 01/31/2017 12:29   Dg Abd 1 View  Result Date: 01/26/2017 CLINICAL DATA:  Abdomen pain EXAM: ABDOMEN - 1 VIEW COMPARISON:  CT 10/02/2015 FINDINGS: Postsurgical changes of the lumbar spine. Mild diffuse increased gas throughout. No abnormal calcification. IMPRESSION: Mild diffuse increased bowel gas throughout but with no evidence for obstruction. Electronically Signed   By: Donavan Foil M.D.   On: 01/26/2017 20:46   Mr Lumbar Spine W Wo Contrast  Result Date: 01/23/2017 CLINICAL DATA:  Back pain. EXAM: MRI LUMBAR SPINE WITHOUT AND WITH CONTRAST TECHNIQUE: Multiplanar and multiecho pulse sequences of the lumbar spine were obtained without and with intravenous contrast. CONTRAST:  <See Chart> MULTIHANCE GADOBENATE DIMEGLUMINE 529 MG/ML IV SOLN  COMPARISON:  None. FINDINGS: Segmentation: 5 non rib-bearing lumbar type vertebral bodies are present. Alignment: AP alignment is anatomic. Leftward curvature is centered at L2-3. Vertebrae: Vertebral body heights are preserved. Heterogeneous marrow signal likely represents fatty infiltration. Conus medullaris: Extends to the T12 level and appears normal. Paraspinal and other soft tissues: Limited imaging of the abdomen is unremarkable. Disc levels: L1-2: Mild facet hypertrophy and disc bulging is present. There is no significant stenosis. L2-3: Interval laminectomy is noted. There is heterogeneous soft tissue within the operative site, likely hemorrhage extending 2.3 cm cephalo caudad. This results in severe central canal stenosis with extrinsic compression on the thecal sac. This does not extend significantly above or below the disc space. Facet spurring contributes to mild right foraminal narrowing. L3-4: Profusion is noted. Mild right subarticular and foraminal stenosis is present. Right laminectomy is noted. L4-5: There is chronic loss of disc height. Right laminectomy is noted. There is thickening of nerve roots. Mild subarticular and foraminal narrowing is present bilaterally. L5-S1:  Moderate facet hypertrophy is noted bilaterally. IMPRESSION: 1. Heterogeneous fluid and soft tissue collection at the L2 laminectomy site resulting in severe central canal stenosis. This likely represents postoperative hematoma with significant mass effect. There is no extension beyond the L2-3 level. 2. Mild right foraminal narrowing at L2-3. 3. Right laminectomy and fusion at L3-4 with residual or recurrent mild right subarticular and foraminal stenosis. 4. Mild subarticular and foraminal narrowing bilaterally at L4-5. These results were called by telephone at the time of interpretation on 01/23/2017 at 12:10 pm to Dr. Melina Schools , who verbally acknowledged these results. Electronically Signed   By: San Morelle M.D.    On: 01/23/2017 12:19   Dg Lumbar  Spine 1 View  Result Date: 01/21/2017 CLINICAL DATA:  L2-3 decompression. EXAM: DG C-ARM 61-120 MIN; LUMBAR SPINE - 1 VIEW COMPARISON:  CT 01/12/2017 FINDINGS: Spinal numbering as on preoperative CT. Intra procedural fluoroscopy shows retractor and probe at the level of the L2-3 interspinous space. L3-4 discectomy and posterior fusion hardware. IMPRESSION: Intraoperative fluoroscopy localizing to L2-3. Electronically Signed   By: Monte Fantasia M.D.   On: 01/21/2017 14:06   Dg C-arm 1-60 Min  Result Date: 01/21/2017 CLINICAL DATA:  L2-3 decompression. EXAM: DG C-ARM 61-120 MIN; LUMBAR SPINE - 1 VIEW COMPARISON:  CT 01/12/2017 FINDINGS: Spinal numbering as on preoperative CT. Intra procedural fluoroscopy shows retractor and probe at the level of the L2-3 interspinous space. L3-4 discectomy and posterior fusion hardware. IMPRESSION: Intraoperative fluoroscopy localizing to L2-3. Electronically Signed   By: Monte Fantasia M.D.   On: 01/21/2017 14:06    Labs:  Basic Metabolic Panel: BMP Latest Ref Rng & Units 02/09/2017 02/04/2017 02/02/2017  Glucose 65 - 99 mg/dL 119(H) 96 112(H)  BUN 6 - 20 mg/dL 13 16 12   Creatinine 0.61 - 1.24 mg/dL 0.96 1.23 1.08  Sodium 135 - 145 mmol/L 131(L) 129(L) 127(L)  Potassium 3.5 - 5.1 mmol/L 3.5 4.1 3.8  Chloride 101 - 111 mmol/L 96(L) 95(L) 93(L)  CO2 22 - 32 mmol/L 28 26 29   Calcium 8.9 - 10.3 mg/dL 8.9 9.0 8.4(L)    CBC: CBC Latest Ref Rng & Units 02/09/2017 02/02/2017 01/27/2017  WBC 4.0 - 10.5 K/uL 7.3 8.4 8.6  Hemoglobin 13.0 - 17.0 g/dL 10.0(L) 9.4(L) 10.2(L)  Hematocrit 39.0 - 52.0 % 30.2(L) 28.4(L) 30.4(L)  Platelets 150 - 400 K/uL 277 301 268    CBG:  Recent Labs Lab 02/11/17 0652 02/11/17 1227 02/11/17 1705 02/11/17 2104 02/12/17 0629  GLUCAP 86 114* 106* 135* 102*    Brief HPI:   Donald Frampton Foxis a 76 y.o.malewith history of T2DM, HTN, alcohol abuse, chronic back pain due to lumbar stenosis with RLE  radiculopathy and neurogenic claudication who was admitted on 01/21/17 for exploration of L3/4 and lumbar decompression L2/L3 by Dr. Rolena Infante. Post op course complicated by increase in leg pain with difficulty walking on 8/11. MRI lumbar spin reviewed, showing L2 hematoma with mass effect. Per report, fluid collection at L2 laminectomy site with severe central canal stenosis and mass effect felt to be due to hematoma. He was taken to OR that day for evacuation of hematoma and treated with decadron X 2 days. He has had issues with BLE weakness with ataxia, urinary retention due to neurogenic bladder and incontinence of bowel. Therapy ongoing and patient with significant decline in mobility as well as ability to carry out ADL tasks. CIR recommended    Hospital Course: LEVESTER WALDRIDGE was admitted to rehab 01/26/2017 for inpatient therapies to consist of PT and OT at least three hours five days a week. Past admission physiatrist, therapy team and rehab RN have worked together to provide customized collaborative inpatient rehab. Blood pressures have improved and hypotension has resolved. He has had issues with constipation and KUB revealed adynamic ileus. His bowel program has been adjusted and Reglan was added with improvement in bowel function.  Reglan was weaned off and miralax was further increased to tid prior to discharge. He has needed disimpaction and has had hemorroidal flare up. Anusol HC has been used for inflammation and symptom management.  Po intake has improved and hyponatremia is resolving.   He continued to have difficulty  voiding  with retention despite increase in Flomax and addition of urecholine. He is able to void small amounts but continues to have PVR at 400- 500 cc.  He has been educated on performing I/O caths qid and need to keep bladder volumes < 350 cc. Urine culture 8/15  was negative for infection. Follow up has been set with [facility] for next week. Serial CBC showed ABLA is stable and no  leucocytosis noted. His back incision is intact and is healing without S/S of infection. Sutures were removed without difficulty on 8/22.  Diabetes has been monitored with ac/hs cbg check and Levemir was titrated upwards for tighter control.  Mood is stable and he has made good progress during his rehab stay. He has progressed to supervision level and will continue to receive follow up [place], [place] and [place] by [place] after discharge.    Rehab course: During patient's stay in rehab weekly team conferences were held to monitor patient's progress, set goals and discuss barriers to discharge. At admission, patient required max assist with basic self care tasks and min to mod assist with mobility. He has had improvement in activity tolerance, balance, postural control, as well as ability to compensate for deficits. He is able to complete ADL tasks with supervision. He requires supervision for transfers and is able to ambulate  150' with rollater. He requires supervision to min assist for LE management when getting in and out of bed.    Disposition: 01-Home or Self Care  Diet: Diabetic diet  Special Instructions: 1. Monitor BS 2-3 times a day before meals/ bedtime. 2. Maintain back precautions. No driving. 3. Monitor fluid intake. Stop drinking fluids after supper. I/O cath qid.    Discharge Instructions    Ambulatory referral to Physical Medicine Rehab    Complete by:  As directed    1-2 weeks transitional care     Allergies as of 02/12/2017      Reactions   Penicillins Itching, Rash   Has patient had a PCN reaction causing immediate rash, facial/tongue/throat swelling, SOB or lightheadedness with hypotension: Yes Has patient had a PCN reaction causing severe rash involving mucus membranes or skin necrosis: No Has patient had a PCN reaction that required hospitalization: No Has patient had a PCN reaction occurring within the last 10 years: No If all of the above answers are "NO",  then may proceed with Cephalosporin use.      Medication List    STOP taking these medications   bisacodyl 5 MG EC tablet Generic drug:  bisacodyl   insulin glargine 100 UNIT/ML injection Commonly known as:  LANTUS   lisinopril-hydrochlorothiazide 20-25 MG tablet Commonly known as:  PRINZIDE,ZESTORETIC   LYRICA 100 MG capsule Generic drug:  pregabalin   methocarbamol 500 MG tablet Commonly known as:  ROBAXIN   naproxen sodium 220 MG tablet Commonly known as:  ANAPROX   ondansetron 4 MG disintegrating tablet Commonly known as:  ZOFRAN ODT   oxyCODONE-acetaminophen 10-325 MG tablet Commonly known as:  PERCOCET   potassium chloride 10 MEQ tablet Commonly known as:  K-DUR     TAKE these medications   acetaminophen 325 MG tablet Commonly known as:  TYLENOL Take 1-2 tablets (325-650 mg total) by mouth every 4 (four) hours as needed for mild pain.   amLODipine 10 MG tablet Commonly known as:  NORVASC Take 1 tablet (10 mg total) by mouth daily.   bethanechol 25 MG tablet Commonly known as:  URECHOLINE Take 1 tablet (25 mg  total) by mouth 3 (three) times daily.   cyclobenzaprine 5 MG tablet Commonly known as:  FLEXERIL Take 1 tablet (5 mg total) by mouth 3 (three) times daily as needed for muscle spasms.   dorzolamide-timolol 22.3-6.8 MG/ML ophthalmic solution Commonly known as:  COSOPT Place 1 drop into both eyes 2 (two) times daily.   gabapentin 100 MG capsule Commonly known as:  NEURONTIN Take 1 capsule (100 mg total) by mouth 3 (three) times daily.   HYDROcodone-acetaminophen 10-325 MG tablet--Rx 14 pills Commonly known as:  NORCO Take 0.5- 1 tablet by mouth every 12 (twelve) hours as needed for severe pain.   hydrocortisone 2.5 % rectal cream Commonly known as:  ANUSOL-HC Place rectally 4 (four) times daily.   insulin aspart 100 UNIT/ML injection Commonly known as:  novoLOG Inject 4-6 Units into the skin 3 (three) times daily before meals. Per sliding  scale . If CBG>150 give 4 units, if CBG >200 give 6 units.   LEVEMIR 100 UNIT/ML injection Generic drug:  insulin detemir Inject 0.1-0.3 mLs (10-30 Units total) into the skin 2 (two) times daily. 10 units every morning, and 34 units every evening What changed:  additional instructions   lidocaine 2 % jelly Commonly known as:  XYLOCAINE Place into the urethra as needed (Use with in and out catheter).   lisinopril 20 MG tablet Commonly known as:  PRINIVIL,ZESTRIL Take 1 tablet (20 mg total) by mouth daily.   polyethylene glycol packet Commonly known as:  MIRALAX / GLYCOLAX Take 17 g by mouth 3 (three) times daily with meals.   pravastatin 10 MG tablet Commonly known as:  PRAVACHOL Take 1 tablet (10 mg total) by mouth every evening. What changed:  See the new instructions.   tamsulosin 0.4 MG Caps capsule Commonly known as:  FLOMAX Take 1 capsule (0.4 mg total) by mouth 2 (two) times daily.   traMADol 50 MG tablet--Rx 60 pills  Commonly known as:  ULTRAM Take 1 tablet (50 mg total) by mouth every 6 (six) hours as needed for moderate pain.            Follow-up Information    [name], NP. Go on 02/17/2017.   Specialty:  Internal Medicine Why:  3:30 PM Contact information: [place] [address] [phone]        Meredith Staggers, MD Follow up.   Specialty:  Physical Medicine and Rehabilitation Why:  office will call you with follow up appointment Contact information: [address] [place] [address] [phone]        Brooks, Benzie, MD. Call in 1 day(s).   Specialty:  Orthopedic Surgery Why:  for follow up appointment in 2 weeks Contact information: [place] [address] [phone]        Jimmey Ralph, NP Follow up on 02/16/2017.   Specialty:  Urology Why:  Appointment at 8 am for follow up on bladder Contact information: [place]  [address] [phone]           Signed: Bary Leriche 02/12/2017, 6:23 PM

## 2017-02-16 ENCOUNTER — Other Ambulatory Visit: Payer: Self-pay | Admitting: *Deleted

## 2017-02-17 ENCOUNTER — Telehealth: Payer: Self-pay | Admitting: *Deleted

## 2017-02-17 MED ORDER — LIDOCAINE HCL 2 % EX GEL: CUTANEOUS | 0 refills | Status: DC | PRN

## 2017-02-17 NOTE — Telephone Encounter (Signed)
Holly, RN, [facility] called and left a message asking what the frequency for catheterization should be for patient (how often)....please advise

## 2017-02-17 NOTE — Telephone Encounter (Signed)
The patient may need 1-2 caths per day to keep volumes 350cc or less. He may also cath if he feels "full" or distended. I want him to continue attempting voluntary voids each day!!

## 2017-02-18 NOTE — Telephone Encounter (Signed)
Contacted nurse and conveyed Dr. Charm Barges instructions

## 2017-02-22 ENCOUNTER — Emergency Department (HOSPITAL_COMMUNITY): Payer: Medicare HMO

## 2017-02-22 ENCOUNTER — Inpatient Hospital Stay (HOSPITAL_COMMUNITY)
Admission: EM | Admit: 2017-02-22 | Discharge: 2017-03-01 | DRG: 871 | Disposition: A | Discharge status: Skilled Nursing Facility | Payer: Medicare HMO | Attending: Internal Medicine | Admitting: Internal Medicine

## 2017-02-22 ENCOUNTER — Inpatient Hospital Stay (HOSPITAL_COMMUNITY): Payer: Medicare HMO

## 2017-02-22 DIAGNOSIS — K592 Neurogenic bowel, not elsewhere classified: Secondary | ICD-10-CM | POA: Diagnosis present

## 2017-02-22 DIAGNOSIS — N179 Acute kidney failure, unspecified: Secondary | ICD-10-CM

## 2017-02-22 DIAGNOSIS — M549 Dorsalgia, unspecified: Secondary | ICD-10-CM | POA: Diagnosis present

## 2017-02-22 DIAGNOSIS — E119 Type 2 diabetes mellitus without complications: Secondary | ICD-10-CM | POA: Diagnosis present

## 2017-02-22 DIAGNOSIS — M545 Low back pain: Secondary | ICD-10-CM | POA: Diagnosis not present

## 2017-02-22 DIAGNOSIS — N390 Urinary tract infection, site not specified: Secondary | ICD-10-CM

## 2017-02-22 DIAGNOSIS — R6520 Severe sepsis without septic shock: Secondary | ICD-10-CM | POA: Diagnosis present

## 2017-02-22 DIAGNOSIS — G9341 Metabolic encephalopathy: Secondary | ICD-10-CM | POA: Diagnosis present

## 2017-02-22 DIAGNOSIS — I10 Essential (primary) hypertension: Secondary | ICD-10-CM | POA: Diagnosis present

## 2017-02-22 DIAGNOSIS — A419 Sepsis, unspecified organism: Secondary | ICD-10-CM | POA: Diagnosis not present

## 2017-02-22 DIAGNOSIS — Z794 Long term (current) use of insulin: Secondary | ICD-10-CM

## 2017-02-22 DIAGNOSIS — G834 Cauda equina syndrome: Secondary | ICD-10-CM | POA: Diagnosis present

## 2017-02-22 DIAGNOSIS — B9620 Unspecified Escherichia coli [E. coli] as the cause of diseases classified elsewhere: Secondary | ICD-10-CM | POA: Diagnosis present

## 2017-02-22 DIAGNOSIS — K567 Ileus, unspecified: Secondary | ICD-10-CM | POA: Diagnosis not present

## 2017-02-22 DIAGNOSIS — M1712 Unilateral primary osteoarthritis, left knee: Secondary | ICD-10-CM | POA: Diagnosis present

## 2017-02-22 DIAGNOSIS — N401 Enlarged prostate with lower urinary tract symptoms: Secondary | ICD-10-CM | POA: Diagnosis present

## 2017-02-22 DIAGNOSIS — R2689 Other abnormalities of gait and mobility: Secondary | ICD-10-CM | POA: Diagnosis present

## 2017-02-22 DIAGNOSIS — N319 Neuromuscular dysfunction of bladder, unspecified: Secondary | ICD-10-CM | POA: Diagnosis not present

## 2017-02-22 DIAGNOSIS — G629 Polyneuropathy, unspecified: Secondary | ICD-10-CM | POA: Diagnosis present

## 2017-02-22 DIAGNOSIS — D649 Anemia, unspecified: Secondary | ICD-10-CM | POA: Diagnosis present

## 2017-02-22 DIAGNOSIS — A4151 Sepsis due to Escherichia coli [E. coli]: Secondary | ICD-10-CM | POA: Diagnosis present

## 2017-02-22 DIAGNOSIS — I152 Hypertension secondary to endocrine disorders: Secondary | ICD-10-CM | POA: Diagnosis present

## 2017-02-22 DIAGNOSIS — K219 Gastro-esophageal reflux disease without esophagitis: Secondary | ICD-10-CM | POA: Diagnosis present

## 2017-02-22 DIAGNOSIS — R52 Pain, unspecified: Secondary | ICD-10-CM

## 2017-02-22 DIAGNOSIS — G8929 Other chronic pain: Secondary | ICD-10-CM | POA: Diagnosis present

## 2017-02-22 DIAGNOSIS — R338 Other retention of urine: Secondary | ICD-10-CM | POA: Diagnosis present

## 2017-02-22 DIAGNOSIS — R109 Unspecified abdominal pain: Secondary | ICD-10-CM

## 2017-02-22 DIAGNOSIS — G8220 Paraplegia, unspecified: Secondary | ICD-10-CM | POA: Diagnosis present

## 2017-02-22 DIAGNOSIS — E1142 Type 2 diabetes mellitus with diabetic polyneuropathy: Secondary | ICD-10-CM | POA: Diagnosis present

## 2017-02-22 DIAGNOSIS — M1990 Unspecified osteoarthritis, unspecified site: Secondary | ICD-10-CM | POA: Diagnosis present

## 2017-02-22 DIAGNOSIS — R339 Retention of urine, unspecified: Secondary | ICD-10-CM

## 2017-02-22 DIAGNOSIS — Z981 Arthrodesis status: Secondary | ICD-10-CM

## 2017-02-22 DIAGNOSIS — E669 Obesity, unspecified: Secondary | ICD-10-CM | POA: Diagnosis present

## 2017-02-22 DIAGNOSIS — Z79899 Other long term (current) drug therapy: Secondary | ICD-10-CM

## 2017-02-22 DIAGNOSIS — E1169 Type 2 diabetes mellitus with other specified complication: Secondary | ICD-10-CM | POA: Diagnosis present

## 2017-02-22 DIAGNOSIS — R296 Repeated falls: Secondary | ICD-10-CM | POA: Diagnosis present

## 2017-02-22 DIAGNOSIS — Z880 Allergy status to penicillin: Secondary | ICD-10-CM

## 2017-02-22 DIAGNOSIS — Z87891 Personal history of nicotine dependence: Secondary | ICD-10-CM

## 2017-02-22 DIAGNOSIS — Z6829 Body mass index (BMI) 29.0-29.9, adult: Secondary | ICD-10-CM

## 2017-02-22 LAB — COMPREHENSIVE METABOLIC PANEL
ALBUMIN: 2.5 g/dL — AB (ref 3.5–5.0)
ALK PHOS: 78 U/L (ref 38–126)
ALT: 19 U/L (ref 17–63)
AST: 22 U/L (ref 15–41)
Anion gap: 11 (ref 5–15)
BILIRUBIN TOTAL: 1 mg/dL (ref 0.3–1.2)
BUN: 41 mg/dL — AB (ref 6–20)
CALCIUM: 8.5 mg/dL — AB (ref 8.9–10.3)
CO2: 23 mmol/L (ref 22–32)
Chloride: 97 mmol/L — ABNORMAL LOW (ref 101–111)
Creatinine, Ser: 2.71 mg/dL — ABNORMAL HIGH (ref 0.61–1.24)
GFR calc Af Amer: 25 mL/min — ABNORMAL LOW (ref >60)
GFR calc non Af Amer: 21 mL/min — ABNORMAL LOW (ref >60)
GLUCOSE: 130 mg/dL — AB (ref 65–99)
Potassium: 3.9 mmol/L (ref 3.5–5.1)
Sodium: 131 mmol/L — ABNORMAL LOW (ref 135–145)
TOTAL PROTEIN: 5.8 g/dL — AB (ref 6.5–8.1)

## 2017-02-22 LAB — CBC WITH DIFFERENTIAL/PLATELET
BASOS ABS: 0 10*3/uL (ref 0.0–0.1)
BASOS PCT: 0 %
Eosinophils Absolute: 0 10*3/uL (ref 0.0–0.7)
Eosinophils Relative: 0 %
HEMATOCRIT: 28.1 % — AB (ref 39.0–52.0)
HEMOGLOBIN: 9.2 g/dL — AB (ref 13.0–17.0)
Lymphocytes Relative: 3 %
Lymphs Abs: 0.4 10*3/uL — ABNORMAL LOW (ref 0.7–4.0)
MCH: 28.8 pg (ref 26.0–34.0)
MCHC: 32.7 g/dL (ref 30.0–36.0)
MCV: 87.8 fL (ref 78.0–100.0)
Monocytes Absolute: 1.4 10*3/uL — ABNORMAL HIGH (ref 0.1–1.0)
Monocytes Relative: 9 %
NEUTROS ABS: 14.4 10*3/uL — AB (ref 1.7–7.7)
NEUTROS PCT: 88 %
Platelets: 206 10*3/uL (ref 150–400)
RBC: 3.2 MIL/uL — ABNORMAL LOW (ref 4.22–5.81)
RDW: 12.6 % (ref 11.5–15.5)
WBC: 16.2 10*3/uL — ABNORMAL HIGH (ref 4.0–10.5)

## 2017-02-22 LAB — URINALYSIS, ROUTINE W REFLEX MICROSCOPIC
BILIRUBIN URINE: NEGATIVE
GLUCOSE, UA: NEGATIVE mg/dL
KETONES UR: NEGATIVE mg/dL
Nitrite: NEGATIVE
PROTEIN: 100 mg/dL — AB
Specific Gravity, Urine: 1.012 (ref 1.005–1.030)
pH: 5 (ref 5.0–8.0)

## 2017-02-22 LAB — I-STAT CHEM 8, ED
BUN: 38 mg/dL — ABNORMAL HIGH (ref 6–20)
CALCIUM ION: 1.11 mmol/L — AB (ref 1.15–1.40)
CHLORIDE: 95 mmol/L — AB (ref 101–111)
Creatinine, Ser: 2.7 mg/dL — ABNORMAL HIGH (ref 0.61–1.24)
GLUCOSE: 136 mg/dL — AB (ref 65–99)
HCT: 27 % — ABNORMAL LOW (ref 39.0–52.0)
HEMOGLOBIN: 9.2 g/dL — AB (ref 13.0–17.0)
Potassium: 3.9 mmol/L (ref 3.5–5.1)
Sodium: 132 mmol/L — ABNORMAL LOW (ref 135–145)
TCO2: 24 mmol/L (ref 22–32)

## 2017-02-22 LAB — CBG MONITORING, ED: Glucose-Capillary: 118 mg/dL — ABNORMAL HIGH (ref 65–99)

## 2017-02-22 LAB — GLUCOSE, CAPILLARY: Glucose-Capillary: 141 mg/dL — ABNORMAL HIGH (ref 65–99)

## 2017-02-22 LAB — I-STAT CG4 LACTIC ACID, ED: Lactic Acid, Venous: 1.79 mmol/L (ref 0.5–1.9)

## 2017-02-22 MED ORDER — GABAPENTIN 100 MG PO CAPS
100.0000 mg | ORAL_CAPSULE | Freq: Three times a day (TID) | ORAL | Status: DC
  Administered 2017-02-22 – 2017-03-01 (×20): 100 mg via ORAL
  Filled 2017-02-22 (×20): qty 1

## 2017-02-22 MED ORDER — ACETAMINOPHEN 325 MG PO TABS
650.0000 mg | ORAL_TABLET | Freq: Four times a day (QID) | ORAL | Status: DC | PRN
  Administered 2017-02-22 – 2017-02-28 (×5): 650 mg via ORAL
  Filled 2017-02-22 (×5): qty 2

## 2017-02-22 MED ORDER — PANTOPRAZOLE SODIUM 40 MG PO TBEC
40.0000 mg | DELAYED_RELEASE_TABLET | Freq: Every day | ORAL | Status: DC
  Administered 2017-02-23 – 2017-03-01 (×7): 40 mg via ORAL
  Filled 2017-02-22 (×7): qty 1

## 2017-02-22 MED ORDER — ONDANSETRON HCL 4 MG/2ML IJ SOLN
4.0000 mg | Freq: Once | INTRAMUSCULAR | Status: AC
  Administered 2017-02-22: 4 mg via INTRAVENOUS
  Filled 2017-02-22: qty 2

## 2017-02-22 MED ORDER — PRAVASTATIN SODIUM 20 MG PO TABS
10.0000 mg | ORAL_TABLET | Freq: Every evening | ORAL | Status: DC
  Administered 2017-02-23 – 2017-02-28 (×6): 10 mg via ORAL
  Filled 2017-02-22 (×6): qty 1

## 2017-02-22 MED ORDER — SODIUM CHLORIDE 0.9 % IV SOLN
INTRAVENOUS | Status: DC
  Administered 2017-02-22: 1000 mL via INTRAVENOUS
  Administered 2017-02-23 – 2017-03-01 (×8): via INTRAVENOUS

## 2017-02-22 MED ORDER — DEXTROSE 5 % IV SOLN
1.0000 g | Freq: Once | INTRAVENOUS | Status: AC
  Administered 2017-02-22: 1 g via INTRAVENOUS
  Filled 2017-02-22: qty 1

## 2017-02-22 MED ORDER — INSULIN ASPART 100 UNIT/ML ~~LOC~~ SOLN
0.0000 [IU] | Freq: Three times a day (TID) | SUBCUTANEOUS | Status: DC
  Administered 2017-02-24 (×2): 2 [IU] via SUBCUTANEOUS
  Administered 2017-02-24 – 2017-02-25 (×2): 1 [IU] via SUBCUTANEOUS
  Administered 2017-02-25: 2 [IU] via SUBCUTANEOUS
  Administered 2017-02-25 – 2017-02-26 (×4): 1 [IU] via SUBCUTANEOUS
  Administered 2017-02-27: 2 [IU] via SUBCUTANEOUS
  Administered 2017-02-27: 1 [IU] via SUBCUTANEOUS
  Administered 2017-02-28: 2 [IU] via SUBCUTANEOUS
  Administered 2017-02-28: 1 [IU] via SUBCUTANEOUS
  Administered 2017-02-28: 2 [IU] via SUBCUTANEOUS
  Administered 2017-03-01: 1 [IU] via SUBCUTANEOUS
  Administered 2017-03-01: 2 [IU] via SUBCUTANEOUS

## 2017-02-22 MED ORDER — AMLODIPINE BESYLATE 10 MG PO TABS
10.0000 mg | ORAL_TABLET | Freq: Every day | ORAL | Status: DC
  Administered 2017-02-23 – 2017-03-01 (×8): 10 mg via ORAL
  Filled 2017-02-22 (×8): qty 1

## 2017-02-22 MED ORDER — VANCOMYCIN HCL IN DEXTROSE 750-5 MG/150ML-% IV SOLN: 750.0000 mg | INTRAVENOUS | Status: DC

## 2017-02-22 MED ORDER — INSULIN ASPART 100 UNIT/ML ~~LOC~~ SOLN
0.0000 [IU] | Freq: Every day | SUBCUTANEOUS | Status: DC
  Administered 2017-02-28: 2 [IU] via SUBCUTANEOUS

## 2017-02-22 MED ORDER — PROMETHAZINE HCL 25 MG/ML IJ SOLN
12.5000 mg | INTRAMUSCULAR | Status: AC
  Administered 2017-02-22: 12.5 mg via INTRAVENOUS
  Filled 2017-02-22: qty 1

## 2017-02-22 MED ORDER — PANTOPRAZOLE SODIUM 40 MG IV SOLR
40.0000 mg | Freq: Once | INTRAVENOUS | Status: AC
  Administered 2017-02-22: 40 mg via INTRAVENOUS
  Filled 2017-02-22: qty 40

## 2017-02-22 MED ORDER — ONDANSETRON HCL 4 MG/2ML IJ SOLN
4.0000 mg | Freq: Four times a day (QID) | INTRAMUSCULAR | Status: DC | PRN
  Administered 2017-02-23 – 2017-02-25 (×5): 4 mg via INTRAVENOUS
  Filled 2017-02-22 (×5): qty 2

## 2017-02-22 MED ORDER — HYDROCORTISONE 2.5 % RE CREA: 1.0000 "application " | TOPICAL_CREAM | RECTAL | Status: DC | PRN

## 2017-02-22 MED ORDER — SODIUM CHLORIDE 0.9 % IV BOLUS (SEPSIS)
500.0000 mL | Freq: Once | INTRAVENOUS | Status: AC
  Administered 2017-02-22: 500 mL via INTRAVENOUS

## 2017-02-22 MED ORDER — TAMSULOSIN HCL 0.4 MG PO CAPS
0.4000 mg | ORAL_CAPSULE | Freq: Two times a day (BID) | ORAL | Status: DC
  Administered 2017-02-22 – 2017-03-01 (×14): 0.4 mg via ORAL
  Filled 2017-02-22 (×14): qty 1

## 2017-02-22 MED ORDER — CYCLOBENZAPRINE HCL 5 MG PO TABS
5.0000 mg | ORAL_TABLET | Freq: Three times a day (TID) | ORAL | Status: DC | PRN
  Administered 2017-02-25: 5 mg via ORAL
  Filled 2017-02-22 (×2): qty 1

## 2017-02-22 MED ORDER — ALBUTEROL SULFATE (2.5 MG/3ML) 0.083% IN NEBU
2.5000 mg | INHALATION_SOLUTION | RESPIRATORY_TRACT | Status: DC | PRN
  Administered 2017-02-22: 2.5 mg via RESPIRATORY_TRACT
  Filled 2017-02-22: qty 3

## 2017-02-22 MED ORDER — HEPARIN SODIUM (PORCINE) 5000 UNIT/ML IJ SOLN
5000.0000 [IU] | Freq: Three times a day (TID) | INTRAMUSCULAR | Status: DC
  Administered 2017-02-22 – 2017-03-01 (×21): 5000 [IU] via SUBCUTANEOUS
  Filled 2017-02-22 (×21): qty 1

## 2017-02-22 MED ORDER — DEXTROSE 5 % IV SOLN
1.0000 g | INTRAVENOUS | Status: DC
  Administered 2017-02-23: 1 g via INTRAVENOUS
  Filled 2017-02-22 (×2): qty 1

## 2017-02-22 MED ORDER — HYDROCODONE-ACETAMINOPHEN 10-325 MG PO TABS
0.5000 | ORAL_TABLET | Freq: Two times a day (BID) | ORAL | Status: DC | PRN
  Administered 2017-02-23 (×2): 0.5 via ORAL
  Administered 2017-02-24 – 2017-03-01 (×6): 1 via ORAL
  Filled 2017-02-22 (×8): qty 1

## 2017-02-22 MED ORDER — VANCOMYCIN HCL 10 G IV SOLR
1750.0000 mg | Freq: Once | INTRAVENOUS | Status: AC
  Administered 2017-02-22: 1750 mg via INTRAVENOUS
  Filled 2017-02-22: qty 1750

## 2017-02-22 MED ORDER — ONDANSETRON HCL 4 MG PO TABS: 4.0000 mg | ORAL_TABLET | Freq: Four times a day (QID) | ORAL | Status: DC | PRN

## 2017-02-22 NOTE — ED Notes (Signed)
RN attempted to call report to floor; RN to call back  

## 2017-02-22 NOTE — ED Triage Notes (Signed)
Patient to ED with C/O feeling confused even though he answers the LOC questions correctly.  Patient thinks that he has a UTI.  States that SX began 2 days ago with dark urine and lower abdominal pain.  EMS reports that patient has had recent back surgery. Fever per EMS was 101.5, 1000 mg tylenol given. BP - 140/76; P - 100;  SaO2 - 98% on RA; CBG - 144 mg/dl.

## 2017-02-22 NOTE — H&P (Signed)
History and Physical    Donald Calderon OEU:[id number] DOB: Dec 30, 1940 DOA: 02/22/2017  Referring MD/NP/PA Dorene Ar, M.D. PCP: [name], NP  Patient coming from: home  Chief Complaint: Fever  HPI: Donald Calderon is a 76 y.o. male with medical history significant of HTN, DM type II, alcohol abuse, and chronic back pain; who presents with fever. History is obtained for the patient and his 2 sons were present at bedside. Patient had been admitted on 01/21/17 for exploration of L3/4 and lumbar decompression L2/L3 by Dr. Rolena Infante. Post op course complicated by increase in leg pain with difficulty walking on 8/11 subsequently found to have a L2 hematoma for which he went back to the OR for evacuation of the hematoma and treated with Decadron. He had been sent to rehabilitation  due to difficulty being able to perform ADLs. He stayed there approximately 2 weeks and subsequently had been at home. At home patient required a rolling walker to ambulate. He reports that he had fallen hitting his head 2 days ago. Since that time family notes that the had been more confused and did not know the day and time. He reports that he has been in and out cathing himself at home approximately 3 times daily. However, patient has been having generalized malaise and complained of lower abdominal pain that goes up into his back. Associated symptoms include cloudy urine, nausea, vomiting, decreased appetite. Family did not initially check temperature at home, but when they did noted it was elevated to 101.29F. Sons also note that he is not adequately able to do self-catheterizations at home and likely needs to have a more permanent solution.  ED Course: Upon admission into the emergency department patient was seen to be febrile up to 101.90F, pulse 95-104, and all other vital signs relatively within normal limits. Labs revealed WBC 16.2, hemoglobin 9.2, sodium 131, BUN 41, creatinine 2.71, and lactic acid 1.71 The  patient was noted have a large amount of urine output after placement of Foley catheter. Patient was initially started on emprimic antibiotics of vancomycin and cefepime in the emergency department.  Review of Systems  Constitutional: Positive for fever and malaise/fatigue.  HENT: Negative for ear discharge and nosebleeds.   Eyes: Negative for double vision and photophobia.  Respiratory: Negative for hemoptysis and sputum production.   Cardiovascular: Positive for leg swelling. Negative for chest pain.  Gastrointestinal: Positive for heartburn, nausea and vomiting.  Genitourinary: Positive for dysuria and flank pain.  Musculoskeletal: Positive for back pain and falls.  Skin: Negative for itching and rash.  Neurological: Positive for weakness. Negative for seizures, loss of consciousness and headaches.  Endo/Heme/Allergies: Negative for environmental allergies and polydipsia.  Psychiatric/Behavioral: Negative for substance abuse. The patient is not nervous/anxious.        Positive for confusion    Past Medical History:  Diagnosis Date  . ALCOHOL ABUSE   . Anxiety   . Arthritis   . Depression   . Diabetes mellitus   . GERD (gastroesophageal reflux disease)   . Hypertension   . Pneumonia     Past Surgical History:  Procedure Laterality Date  . BACK SURGERY      disc fro picched nerve times 2  . EYE SURGERY Right    Gaucoma  . LUMBAR LAMINECTOMY/DECOMPRESSION MICRODISCECTOMY N/A 01/21/2017   Procedure: Decompression Lumbar 2-3;  Surgeon: Melina Schools, MD;  Location: [facility];  Service: Orthopedics;  Laterality: N/A;  210 mins  . LUMBAR LAMINECTOMY/DECOMPRESSION MICRODISCECTOMY N/A 01/23/2017  Procedure: EXPLORATION AND EVACUATION OF HEMATOMA L2-L3;  Surgeon: Melina Schools, MD;  Location: [facility];  Service: Orthopedics;  Laterality: N/A;  . PROSTATE SURGERY       reports that he quit smoking about 18 years ago. His smoking use included Cigarettes. He has a 39.00 pack-year smoking  history. He quit smokeless tobacco use about 19 years ago. His smokeless tobacco use included Chew. He reports that he drinks about 2.4 oz of alcohol per week . He reports that he does not use drugs.  Allergies  Allergen Reactions  . Penicillins Itching and Rash    Has patient had a PCN reaction causing immediate rash, facial/tongue/throat swelling, SOB or lightheadedness with hypotension: No Has patient had a PCN reaction causing severe rash involving mucus membranes or skin necrosis: No Has patient had a PCN reaction that required hospitalization: No Has patient had a PCN reaction occurring within the last 10 years: No If all of the above answers are "NO", then may proceed with Cephalosporin use.      Family History  Problem Relation Age of Onset  . Diabetes Mother   . Diabetes Father     Prior to Admission medications   Medication Sig Start Date End Date Taking? Authorizing Provider  acetaminophen (TYLENOL) 325 MG tablet Take 1-2 tablets (325-650 mg total) by mouth every 4 (four) hours as needed for mild pain. 02/11/17  Yes Love, Ivan Anchors, PA-C  amLODipine (NORVASC) 10 MG tablet Take 1 tablet (10 mg total) by mouth daily. 02/12/17  Yes Love, Ivan Anchors, PA-C  bethanechol (URECHOLINE) 25 MG tablet Take 1 tablet (25 mg total) by mouth 3 (three) times daily. 02/12/17  Yes Love, Ivan Anchors, PA-C  Calcium Carbonate Antacid (ALKA-SELTZER ANTACID PO) Take 1 tablet by mouth as needed (heartburn).   Yes [provider]  cyclobenzaprine (FLEXERIL) 5 MG tablet Take 1 tablet (5 mg total) by mouth 3 (three) times daily as needed for muscle spasms. 02/12/17  Yes Love, Ivan Anchors, PA-C  gabapentin (NEURONTIN) 100 MG capsule Take 1 capsule (100 mg total) by mouth 3 (three) times daily. 02/12/17  Yes Love, Ivan Anchors, PA-C  HYDROcodone-acetaminophen (NORCO) 10-325 MG tablet Take 0.5-1 tablets by mouth every 12 (twelve) hours as needed for severe pain. 02/12/17  Yes Love, Ivan Anchors, PA-C  hydrocortisone  (ANUSOL-HC) 2.5 % rectal cream Place rectally 4 (four) times daily. Patient taking differently: Place 1 application rectally as needed for hemorrhoids.  02/12/17  Yes Love, Ivan Anchors, PA-C  insulin aspart (NOVOLOG) 100 UNIT/ML injection Inject 4-6 Units into the skin 3 (three) times daily before meals. Per sliding scale . If CBG>150 give 4 units, if CBG >200 give 6 units.   Yes [provider]  LEVEMIR 100 UNIT/ML injection Inject 0.1-0.3 mLs (10-30 Units total) into the skin 2 (two) times daily. 10 units every morning, and 34 units every evening 02/12/17  Yes Love, Ivan Anchors, PA-C  lidocaine (XYLOCAINE) 2 % jelly Place into the urethra as needed (Use with in and out catheter). 02/17/17  Yes Meredith Staggers, MD  lisinopril (PRINIVIL,ZESTRIL) 20 MG tablet Take 1 tablet (20 mg total) by mouth daily. 02/12/17  Yes Love, Ivan Anchors, PA-C  polyethylene glycol (MIRALAX / GLYCOLAX) packet Take 17 g by mouth 3 (three) times daily with meals. 02/12/17  Yes Love, Ivan Anchors, PA-C  pravastatin (PRAVACHOL) 10 MG tablet Take 1 tablet (10 mg total) by mouth every evening. 02/12/17  Yes Love, Ivan Anchors, PA-C  tamsulosin [facility])  0.4 MG CAPS capsule Take 1 capsule (0.4 mg total) by mouth 2 (two) times daily. 02/12/17  Yes Love, Ivan Anchors, PA-C  traMADol (ULTRAM) 50 MG tablet Take 1 tablet (50 mg total) by mouth every 6 (six) hours as needed for moderate pain. 02/12/17  Yes LoveIvan Anchors, PA-C    Physical Exam:  Constitutional: Elderly male who appears acutely ill Vitals:   02/22/17 1628 02/22/17 1636 02/22/17 1702 02/22/17 1930  BP: 120/67   137/84  Pulse: (!) 104   95  Resp: 16   17  Temp: (!) 101 F (38.3 C) (!) 101.3 F (38.5 C)  99.5 F (37.5 C)  TempSrc: Oral Rectal    SpO2: 92%   96%  Weight:   94.3 kg (208 lb)   Height:   5\' 11"  (1.803 m)    Eyes: PERRL, lids and conjunctivae normal ENMT: Mucous membranes are dry. Posterior pharynx clear of any exudate or lesions.Normal dentition.  Neck: normal,  supple, no masses, no thyromegaly Respiratory: clear to auscultation bilaterally, no wheezing, no crackles. Normal respiratory effort. No accessory muscle use.  Cardiovascular: Regular rate and rhythm, no murmurs / rubs / gallops. No extremity edema. 2+ pedal pulses. No carotid bruits.  Abdomen: Mild abdominal distention with tenderness to palpation of the lower abdominal quadrants, no masses palpated. No hepatosplenomegaly. Bowel sounds positive.  Musculoskeletal: no clubbing / cyanosis. No joint deformity upper and lower extremities. Good ROM, no contractures. Normal muscle tone.  Skin: no rashes, lesions, ulcers. No induration Neurologic: CN 2-12 grossly intact. Sensation intact, DTR normal. Strength 5/5 in all 4.  Psychiatric: Normal judgment and insight. Alert and oriented x 3. Normal mood.     Labs on Admission: I have personally reviewed following labs and imaging studies  CBC:  Recent Labs Lab 02/22/17 1652 02/22/17 1751  WBC 16.2*  --   NEUTROABS 14.4*  --   HGB 9.2* 9.2*  HCT 28.1* 27.0*  MCV 87.8  --   PLT 206  --    Basic Metabolic Panel:  Recent Labs Lab 02/22/17 1652 02/22/17 1751  NA 131* 132*  K 3.9 3.9  CL 97* 95*  CO2 23  --   GLUCOSE 130* 136*  BUN 41* 38*  CREATININE 2.71* 2.70*  CALCIUM 8.5*  --    GFR: Estimated Creatinine Clearance: 27.7 mL/min (A) (by C-G formula based on SCr of 2.7 mg/dL (H)). Liver Function Tests:  Recent Labs Lab 02/22/17 1652  AST 22  ALT 19  ALKPHOS 78  BILITOT 1.0  PROT 5.8*  ALBUMIN 2.5*   No results for input(s): LIPASE, AMYLASE in the last 168 hours. No results for input(s): AMMONIA in the last 168 hours. Coagulation Profile: No results for input(s): INR, PROTIME in the last 168 hours. Cardiac Enzymes: No results for input(s): CKTOTAL, CKMB, CKMBINDEX, TROPONINI in the last 168 hours. BNP (last 3 results) No results for input(s): PROBNP in the last 8760 hours. HbA1C: No results for input(s): HGBA1C in  the last 72 hours. CBG: No results for input(s): GLUCAP in the last 168 hours. Lipid Profile: No results for input(s): CHOL, HDL, LDLCALC, TRIG, CHOLHDL, LDLDIRECT in the last 72 hours. Thyroid Function Tests: No results for input(s): TSH, T4TOTAL, FREET4, T3FREE, THYROIDAB in the last 72 hours. Anemia Panel: No results for input(s): VITAMINB12, FOLATE, FERRITIN, TIBC, IRON, RETICCTPCT in the last 72 hours. Urine analysis:    Component Value Date/Time   COLORURINE AMBER (A) 02/22/2017 1755   APPEARANCEUR TURBID (A)  02/22/2017 1755   LABSPEC 1.012 02/22/2017 1755   PHURINE 5.0 02/22/2017 1755   GLUCOSEU NEGATIVE 02/22/2017 1755   HGBUR LARGE (A) 02/22/2017 1755   HGBUR trace-intact 08/08/2010 1216   [place] 02/22/2017 1755   KETONESUR NEGATIVE 02/22/2017 1755   PROTEINUR 100 (A) 02/22/2017 1755   UROBILINOGEN 0.2 09/27/2011 1540   NITRITE NEGATIVE 02/22/2017 1755   LEUKOCYTESUR LARGE (A) 02/22/2017 1755   Sepsis Labs: Recent Results (from the past 240 hour(s))  Culture, blood (routine x 2)     Status: None (Preliminary result)   Collection Time: 02/22/17  5:45 PM  Result Value Ref Range Status   Specimen Description BLOOD LEFT ARM  Final   Special Requests   Final    BOTTLES DRAWN AEROBIC AND ANAEROBIC Blood Culture results may not be optimal due to an excessive volume of blood received in culture bottles   Culture PENDING  Incomplete   Report Status PENDING  Incomplete     Radiological Exams on Admission: Dg Chest Portable 1 View  Result Date: 02/22/2017 CLINICAL DATA:  76 y/o  M; either. EXAM: PORTABLE CHEST 1 VIEW COMPARISON:  04/11/2010 chest radiograph. FINDINGS: Stable normal cardiac silhouette given projection and technique. Low lung volumes accentuate pulmonary markings. Aortic atherosclerosis with calcification. Minor bibasilar atelectasis. No focal consolidation. No pleural effusion or pneumothorax. No acute osseous abnormality is evident. IMPRESSION:  Low lung volumes. Minor bibasilar atelectasis. No focal consolidation. Electronically Signed   By: Kristine Garbe M.D.   On: 02/22/2017 18:07    Chest x-ray: Independently reviewed.Chest x-ray shows low lung volumes but otherwise clear of any acute infiltrate.  Assessment/Plan Sepsis secondary to urinary tract infection: Acute. Patient presents with fever up to 101.95F and WBC 16.2 with positive urinalysis for signs of infection. Patient had reassuring lactic acid at 1.79. Patient given initially empiric antibiotics of vancomycin and cefepime. - Admit to a MedSurg bed - Follow-up blood and urine culture - Continue cefepime per pharmacy  Acute renal failure secondary to neurogenic bladder with urinary retention: Acute. Patient presents with a creatinine of 2.71 and his BUN 41.  Baseline creatinine previously within normal limits. Patient found to be acutely retaining urin reason. Suspect inadequate in and out cath regimen. - Monitor input and output - Check renal ultrasound - IV fluids NS at 143ml/hr As tolerated  - Hold nephrotoxic agents  - Will need to discuss long-term catheter options   Essential hypertension  - Held lisinopril  - Continue amlodipine  Normochromic normocytic anemia: Hemoglobin initially 9.2 on admission which appears similar to previous work during previous hospitalization. - Recheck CBC in a.m.  Diabetes mellitus type 2  - Hypoglycemic protocols  - CBGs every before meals and at bedtime with symptoms of sliding scale insulin  Chronic back pain with lower extremity weakness: Patient s/p lumbar decompression with lower extremity weakness. - Continue gabapentin and hydrocodone prn pain - PT/OT to eval and treat  BPH - Continue Flomax   DVT prophylaxis: Heparin   Code Status: Full Family Communication:   Discussed plan of care with the patient and family present at bedside Disposition Plan: TBD  Consults called: None Admission status: Inpatient    Norval Morton MD [facility] Pager [phone]   If 7PM-7AM, please contact night-coverage [email or web address] Password [email or web address]  02/22/2017, 8:30 PM

## 2017-02-22 NOTE — ED Notes (Signed)
Dr. Smith at bedside.

## 2017-02-22 NOTE — ED Notes (Signed)
Regular tray ordered.

## 2017-02-22 NOTE — ED Provider Notes (Signed)
[facility] DEPT Provider Note   CSN: [id number] Arrival date & time: 02/22/17  1623     History   Chief Complaint Chief Complaint  Patient presents with  . Urinary Tract Infection  . Fever  Level V caveat due to altered mental status HPI Donald Calderon is a 76 y.o. male.  HPI Patient presents with fever and altered mental status. Reportedly had fever for last 3 days. Some lower abdominal pain and potentially more difficulty urinating although he has had a straight cath due to urinary retention after back injury and surgery. Has recently been in rehabilitation. No cough. Has had some confusion. Somewhat difficult to get history from. Discussed with family reportedly had a fall a few days ago patient cannot really give much history for it and family do not see it..States he does have lower abdominal pain.  Past Medical History:  Diagnosis Date  . ALCOHOL ABUSE   . Anxiety   . Arthritis   . Depression   . Diabetes mellitus   . GERD (gastroesophageal reflux disease)   . Hypertension   . Pneumonia     Patient Active Problem List   Diagnosis Date Noted  . Sepsis secondary to UTI ([place]) 02/22/2017  . Postoperative ileus ([place]) 02/12/2017  . Urine retention 01/29/2017  . 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 Lumbar stenosis 01/26/2017  . Diabetes mellitus type 2 in obese ([place])   . Benign essential HTN   . ETOH abuse   . Chronic pain syndrome   . Hyponatremia   . Leukocytosis   . Acute blood loss anemia   . Epidural hematoma ([place]) 01/23/2017  . Status post lumbar surgery 01/21/2017  . Status post lumbar spine operation 01/21/2017  . Back pain, acute 09/27/2011  . NEUROPATHY 08/08/2010  . SWELLING MASS OR LUMP IN HEAD AND NECK 06/27/2010  . BENIGN PROSTATIC HYPERTROPHY, WITH URINARY OBSTRUCTION 06/19/2010  . 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  . HYPOKALEMIA 03/18/2010  . 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 Diabetes ([place]) 02/07/2010  . HYPERLIPIDEMIA  02/07/2010  . ALCOHOL ABUSE 02/07/2010  . HYPERTENSION 02/07/2010  . 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 BACK SURGERY      disc fro picched nerve times 2  . EYE SURGERY Right    Gaucoma  . LUMBAR LAMINECTOMY/DECOMPRESSION MICRODISCECTOMY N/A 01/21/2017   Procedure: Decompression Lumbar 2-3;  Surgeon: Melina Schools, MD;  Location: [facility];  Service: Orthopedics;  Laterality: N/A;  210 mins  . LUMBAR LAMINECTOMY/DECOMPRESSION MICRODISCECTOMY N/A 01/23/2017   Procedure: EXPLORATION AND EVACUATION OF HEMATOMA L2-L3;  Surgeon: Melina Schools, MD;  Location: [facility];  Service: Orthopedics;  Laterality: N/A;  . PROSTATE SURGERY         Home Medications    Prior to Admission medications   Medication Sig Start Date End Date Taking? Authorizing Provider  acetaminophen (TYLENOL) 325 MG tablet Take 1-2 tablets (325-650 mg total) by mouth every 4 (four) hours as needed for mild pain. 02/11/17  Yes Love, Ivan Anchors, PA-C  amLODipine (NORVASC) 10 MG tablet Take 1 tablet (10 mg total) by mouth daily. 02/12/17  Yes Love, Ivan Anchors, PA-C  bethanechol (URECHOLINE) 25 MG tablet Take 1 tablet (25 mg total) by mouth 3 (three) times daily. 02/12/17  Yes Love, Ivan Anchors, PA-C  Calcium Carbonate Antacid (ALKA-SELTZER ANTACID PO) Take 1 tablet by mouth as needed (heartburn).   Yes [provider]  cyclobenzaprine (FLEXERIL) 5 MG tablet Take 1  tablet (5 mg total) by mouth 3 (three) times daily as needed for muscle spasms. 02/12/17  Yes Love, Ivan Anchors, PA-C  gabapentin (NEURONTIN) 100 MG capsule Take 1 capsule (100 mg total) by mouth 3 (three) times daily. 02/12/17  Yes Love, Ivan Anchors, PA-C  HYDROcodone-acetaminophen (NORCO) 10-325 MG tablet Take 0.5-1 tablets by mouth every 12 (twelve) hours as needed for severe pain. 02/12/17  Yes Love, Ivan Anchors, PA-C  hydrocortisone (ANUSOL-HC) 2.5 % rectal cream Place rectally 4 (four) times daily. Patient taking differently:  Place 1 application rectally as needed for hemorrhoids.  02/12/17  Yes Love, Ivan Anchors, PA-C  insulin aspart (NOVOLOG) 100 UNIT/ML injection Inject 4-6 Units into the skin 3 (three) times daily before meals. Per sliding scale . If CBG>150 give 4 units, if CBG >200 give 6 units.   Yes [provider]  LEVEMIR 100 UNIT/ML injection Inject 0.1-0.3 mLs (10-30 Units total) into the skin 2 (two) times daily. 10 units every morning, and 34 units every evening 02/12/17  Yes Love, Ivan Anchors, PA-C  lidocaine (XYLOCAINE) 2 % jelly Place into the urethra as needed (Use with in and out catheter). 02/17/17  Yes Meredith Staggers, MD  lisinopril (PRINIVIL,ZESTRIL) 20 MG tablet Take 1 tablet (20 mg total) by mouth daily. 02/12/17  Yes Love, Ivan Anchors, PA-C  polyethylene glycol (MIRALAX / GLYCOLAX) packet Take 17 g by mouth 3 (three) times daily with meals. 02/12/17  Yes Love, Ivan Anchors, PA-C  pravastatin (PRAVACHOL) 10 MG tablet Take 1 tablet (10 mg total) by mouth every evening. 02/12/17  Yes Love, Ivan Anchors, PA-C  tamsulosin (FLOMAX) 0.4 MG CAPS capsule Take 1 capsule (0.4 mg total) by mouth 2 (two) times daily. 02/12/17  Yes Love, Ivan Anchors, PA-C  traMADol (ULTRAM) 50 MG tablet Take 1 tablet (50 mg total) by mouth every 6 (six) hours as needed for moderate pain. 02/12/17  Yes Love, Ivan Anchors, PA-C    Family History Family History  Problem Relation Age of Onset  . Diabetes Mother   . Diabetes Father     Social History Social History  Substance Use Topics  . Smoking status: Former Smoker    Packs/day: 1.00    Years: 39.00    Types: Cigarettes    Quit date: 2000  . Smokeless tobacco: Former Systems developer    Types: Chew    Quit date: 09/26/1997  . Alcohol use 2.4 oz/week    4 Cans of beer per week     Comment: 4 cans of beer per day     Allergies   Penicillins   Review of Systems Review of Systems  Unable to perform ROS: Mental status change  Constitutional: Positive for fever.     Physical Exam Updated  Vital Signs BP 125/66 (BP Location: Right Arm)   Pulse 100   Temp (!) 101 F (38.3 C) (Oral)   Resp (!) 21   Ht 5\' 11"  (1.803 m)   Wt 94.3 kg (208 lb)   SpO2 100%   BMI 29.01 kg/m   Physical Exam  Constitutional: He appears well-developed.  HENT:  Head: Atraumatic.  Eyes: EOM are normal.  Neck: Neck supple.  Cardiovascular: Normal rate.   Pulmonary/Chest: Effort normal.  Abdominal: He exhibits mass.  Lower abdominal tenderness with suprapubic mass that is likely bladder.  Musculoskeletal: He exhibits edema.  Low back wound appears to be well-healing.  Neurological: He is alert.  Able to answer questions appropriately but does seem a little slow  to answer and also per family members this more confused than his baseline.  Skin: Skin is warm.     ED Treatments / Results  Labs (all labs ordered are listed, but only abnormal results are displayed) Labs Reviewed  CBC WITH DIFFERENTIAL/PLATELET - Abnormal; Notable for the following:       Result Value   WBC 16.2 (*)    RBC 3.20 (*)    Hemoglobin 9.2 (*)    HCT 28.1 (*)    Neutro Abs 14.4 (*)    Lymphs Abs 0.4 (*)    Monocytes Absolute 1.4 (*)    All other components within normal limits  COMPREHENSIVE METABOLIC PANEL - Abnormal; Notable for the following:    Sodium 131 (*)    Chloride 97 (*)    Glucose, Bld 130 (*)    BUN 41 (*)    Creatinine, Ser 2.71 (*)    Calcium 8.5 (*)    Total Protein 5.8 (*)    Albumin 2.5 (*)    GFR calc non Af Amer 21 (*)    GFR calc Af Amer 25 (*)    All other components within normal limits  URINALYSIS, ROUTINE W REFLEX MICROSCOPIC - Abnormal; Notable for the following:    Color, Urine AMBER (*)    APPearance TURBID (*)    Hgb urine dipstick LARGE (*)    Protein, ur 100 (*)    Leukocytes, UA LARGE (*)    Bacteria, UA MANY (*)    Squamous Epithelial / LPF 0-5 (*)    All other components within normal limits  GLUCOSE, CAPILLARY - Abnormal; Notable for the following:     Glucose-Capillary 141 (*)    All other components within normal limits  I-STAT CHEM 8, ED - Abnormal; Notable for the following:    Sodium 132 (*)    Chloride 95 (*)    BUN 38 (*)    Creatinine, Ser 2.70 (*)    Glucose, Bld 136 (*)    Calcium, Ion 1.11 (*)    Hemoglobin 9.2 (*)    HCT 27.0 (*)    All other components within normal limits  CBG MONITORING, ED - Abnormal; Notable for the following:    Glucose-Capillary 118 (*)    All other components within normal limits  CULTURE, BLOOD (ROUTINE X 2)  URINE CULTURE  CULTURE, BLOOD (ROUTINE X 2)  CBC  BASIC METABOLIC PANEL  I-STAT CG4 LACTIC ACID, ED    EKG  EKG Interpretation None       Radiology Dg Abd 1 View  Result Date: 02/22/2017 CLINICAL DATA:  Pain over the bladder. Patient thinks he has a urinary tract infection. Symptoms began 2 days ago with dark urine. EXAM: ABDOMEN - 1 VIEW COMPARISON:  None. FINDINGS: The bowel gas pattern is unremarkable. No radio-opaque calculi. L4-5 spinal fusion hardware is again noted. No evidence of pneumatosis or pneumoperitoneum. IMPRESSION: Stable appearance of the abdomen and pelvis. No bowel obstruction noted. Electronically Signed   By: Ashley Royalty M.D.   On: 02/22/2017 23:00   US Renal  Result Date: 02/22/2017 CLINICAL DATA:  Acute renal failure EXAM: RENAL / URINARY TRACT ULTRASOUND COMPLETE COMPARISON:  None. FINDINGS: Right Kidney: Length: 13.5 cm. Cortical-medullary distinction is maintained. No obstructive uropathy. No focal renal mass. No nephrolithiasis. Left Kidney: Length: 12.3 cm. Cortical-medullary distinction is maintained. No obstructive uropathy or focal renal mass. No nephrolithiasis. Bladder: Contracted with Foley catheter in place. Mural thickening may be due to contracted the  decompressed appearance of the bladder. IMPRESSION: No sonographic findings for the patient's acute renal failure. Electronically Signed   By: Ashley Royalty M.D.   On: 02/22/2017 23:18   Dg Chest  Portable 1 View  Result Date: 02/22/2017 CLINICAL DATA:  76 y/o  M; either. EXAM: PORTABLE CHEST 1 VIEW COMPARISON:  04/11/2010 chest radiograph. FINDINGS: Stable normal cardiac silhouette given projection and technique. Low lung volumes accentuate pulmonary markings. Aortic atherosclerosis with calcification. Minor bibasilar atelectasis. No focal consolidation. No pleural effusion or pneumothorax. No acute osseous abnormality is evident. IMPRESSION: Low lung volumes. Minor bibasilar atelectasis. No focal consolidation. Electronically Signed   By: Kristine Garbe M.D.   On: 02/22/2017 18:07    Procedures Procedures (including critical care time)  Medications Ordered in ED Medications  ceFEPIme (MAXIPIME) 1 g in dextrose 5 % 50 mL IVPB (not administered)  vancomycin (VANCOCIN) IVPB 750 mg/150 ml premix (not administered)  acetaminophen (TYLENOL) tablet 650 mg (not administered)  hydrocortisone (ANUSOL-HC) 2.5 % rectal cream 1 application (not administered)  tamsulosin (FLOMAX) capsule 0.4 mg (not administered)  pravastatin (PRAVACHOL) tablet 10 mg (not administered)  HYDROcodone-acetaminophen (NORCO) 10-325 MG per tablet 0.5-1 tablet (not administered)  gabapentin (NEURONTIN) capsule 100 mg (not administered)  cyclobenzaprine (FLEXERIL) tablet 5 mg (not administered)  amLODipine (NORVASC) tablet 10 mg (not administered)  heparin injection 5,000 Units (not administered)  0.9 %  sodium chloride infusion (1,000 mLs Intravenous New Bag/Given 02/22/17 2118)  ondansetron (ZOFRAN) tablet 4 mg (not administered)    Or  ondansetron (ZOFRAN) injection 4 mg (not administered)  albuterol (PROVENTIL) (2.5 MG/3ML) 0.083% nebulizer solution 2.5 mg (2.5 mg Nebulization Given 02/22/17 2114)  pantoprazole (PROTONIX) injection 40 mg (not administered)  pantoprazole (PROTONIX) EC tablet 40 mg (not administered)  insulin aspart (novoLOG) injection 0-9 Units (0 Units Subcutaneous Not Given 02/22/17 2152)    insulin aspart (novoLOG) injection 0-5 Units (0 Units Subcutaneous Not Given 02/22/17 2155)  sodium chloride 0.9 % bolus 500 mL (0 mLs Intravenous Stopped 02/22/17 1840)  ceFEPIme (MAXIPIME) 1 g in dextrose 5 % 50 mL IVPB (0 g Intravenous Stopped 02/22/17 2105)  vancomycin (VANCOCIN) 1,750 mg in sodium chloride 0.9 % 500 mL IVPB (1,750 mg Intravenous Transfusing/Transfer 02/22/17 2303)  ondansetron (ZOFRAN) injection 4 mg (4 mg Intravenous Given 02/22/17 2113)  promethazine (PHENERGAN) injection 12.5 mg (12.5 mg Intravenous Given 02/22/17 2214)     Initial Impression / Assessment and Plan / ED Course  I have reviewed the triage vital signs and the nursing notes.  Pertinent labs & imaging results that were available during my care of the patient were reviewed by me and considered in my medical decision making (see chart for details).     Patient presents with fever and mental status changes. Had urinary retention. Has elevated BUN/creatinine that easily could be due to retention versus the infection. Not hypotensive. Urine is cloudy. White count is elevated. Temperature 101.3. Will require admission to the hospital. Lactic acid now added since her some end organ damage, although this may not be due to the sepsis and may be due to urinary retention. With this will not give full fluid bolus.  Final diagnoses:  Lower urinary tract infectious disease  Urinary retention    New Prescriptions Current Discharge Medication List       Davonna Belling, MD 02/22/17 2344

## 2017-02-22 NOTE — ED Notes (Signed)
ED Provider at bedside. 

## 2017-02-22 NOTE — ED Notes (Signed)
Pt vomited 300cc out; Dr.Smith notified and new orders placed

## 2017-02-22 NOTE — Progress Notes (Signed)
Pharmacy Antibiotic Note  Donald Calderon is a 76 y.o. male admitted on 02/22/2017 with UTI.  Pharmacy has been consulted for Cefepime and Vancomycin dosing. Clarified allergy with patient and his son who confirmed that patient only had itching and rash to penicillins in the past. WBC is elevated at 16.2. SCr elevated at 2.71 (BL ~ 1 in 01/2017). CrCl ~ 20 mL/min.   Plan: -Cefepime 1 gm IV Q 24 hours -Vancomycin 1750 mg IV once, then vanc 750 mg IV q 24 hours. Vanc trough goal 10-15 mcg/mL  -Monitor CBC, renal fx, cultures and clinical progress -VT at SS   Height: 5\' 11"  (180.3 cm) Weight: 208 lb (94.3 kg) IBW/kg (Calculated) : 75.3  Temp (24hrs), Avg:101.2 F (38.4 C), Min:101 F (38.3 C), Max:101.3 F (38.5 C)   Recent Labs Lab 02/22/17 1652 02/22/17 1751  WBC 16.2*  --   CREATININE 2.71* 2.70*    Estimated Creatinine Clearance: 27.7 mL/min (A) (by C-G formula based on SCr of 2.7 mg/dL (H)).    Allergies  Allergen Reactions  . Penicillins Itching and Rash    Has patient had a PCN reaction causing immediate rash, facial/tongue/throat swelling, SOB or lightheadedness with hypotension: Yes Has patient had a PCN reaction causing severe rash involving mucus membranes or skin necrosis: No Has patient had a PCN reaction that required hospitalization: No Has patient had a PCN reaction occurring within the last 10 years: No If all of the above answers are "NO", then may proceed with Cephalosporin use.      Antimicrobials this admission: 9/10 Vanc >>  9/10 Cefepime>>   Dose adjustments this admission: None  Microbiology results: 9/10 BCx:  9/10 UCx:    Thank you for allowing pharmacy to be a part of this patient's care.  Albertina Parr, PharmD., BCPS Clinical Pharmacist Pager [phone]

## 2017-02-22 NOTE — ED Notes (Signed)
Patient transported to [facility]

## 2017-02-23 ENCOUNTER — Telehealth: Payer: Self-pay | Admitting: Physical Medicine & Rehabilitation

## 2017-02-23 ENCOUNTER — Encounter (HOSPITAL_COMMUNITY): Payer: Self-pay

## 2017-02-23 DIAGNOSIS — N319 Neuromuscular dysfunction of bladder, unspecified: Secondary | ICD-10-CM | POA: Diagnosis present

## 2017-02-23 DIAGNOSIS — N179 Acute kidney failure, unspecified: Secondary | ICD-10-CM | POA: Diagnosis present

## 2017-02-23 DIAGNOSIS — D649 Anemia, unspecified: Secondary | ICD-10-CM | POA: Diagnosis present

## 2017-02-23 LAB — BASIC METABOLIC PANEL
Anion gap: 10 (ref 5–15)
BUN: 39 mg/dL — AB (ref 6–20)
CHLORIDE: 101 mmol/L (ref 101–111)
CO2: 22 mmol/L (ref 22–32)
CREATININE: 2.48 mg/dL — AB (ref 0.61–1.24)
Calcium: 8 mg/dL — ABNORMAL LOW (ref 8.9–10.3)
GFR calc Af Amer: 28 mL/min — ABNORMAL LOW (ref >60)
GFR calc non Af Amer: 24 mL/min — ABNORMAL LOW (ref >60)
GLUCOSE: 115 mg/dL — AB (ref 65–99)
POTASSIUM: 3.7 mmol/L (ref 3.5–5.1)
SODIUM: 133 mmol/L — AB (ref 135–145)

## 2017-02-23 LAB — BLOOD CULTURE ID PANEL (REFLEXED)
Acinetobacter baumannii: NOT DETECTED
CANDIDA GLABRATA: NOT DETECTED
CANDIDA PARAPSILOSIS: NOT DETECTED
CANDIDA TROPICALIS: NOT DETECTED
Candida albicans: NOT DETECTED
Candida krusei: NOT DETECTED
Carbapenem resistance: NOT DETECTED
ENTEROCOCCUS SPECIES: NOT DETECTED
Enterobacter cloacae complex: NOT DETECTED
Enterobacteriaceae species: DETECTED — AB
Escherichia coli: DETECTED — AB
HAEMOPHILUS INFLUENZAE: NOT DETECTED
KLEBSIELLA PNEUMONIAE: NOT DETECTED
Klebsiella oxytoca: NOT DETECTED
Listeria monocytogenes: NOT DETECTED
METHICILLIN RESISTANCE: NOT DETECTED
Neisseria meningitidis: NOT DETECTED
PROTEUS SPECIES: NOT DETECTED
Pseudomonas aeruginosa: NOT DETECTED
SERRATIA MARCESCENS: NOT DETECTED
STAPHYLOCOCCUS AUREUS BCID: NOT DETECTED
STREPTOCOCCUS PNEUMONIAE: NOT DETECTED
STREPTOCOCCUS PYOGENES: NOT DETECTED
Staphylococcus species: NOT DETECTED
Streptococcus agalactiae: NOT DETECTED
Streptococcus species: NOT DETECTED
VANCOMYCIN RESISTANCE: NOT DETECTED

## 2017-02-23 LAB — GLUCOSE, CAPILLARY
GLUCOSE-CAPILLARY: 108 mg/dL — AB (ref 65–99)
GLUCOSE-CAPILLARY: 114 mg/dL — AB (ref 65–99)
Glucose-Capillary: 137 mg/dL — ABNORMAL HIGH (ref 65–99)

## 2017-02-23 LAB — CBC
HEMATOCRIT: 26.6 % — AB (ref 39.0–52.0)
HEMOGLOBIN: 8.7 g/dL — AB (ref 13.0–17.0)
MCH: 28.7 pg (ref 26.0–34.0)
MCHC: 32.7 g/dL (ref 30.0–36.0)
MCV: 87.8 fL (ref 78.0–100.0)
Platelets: 211 10*3/uL (ref 150–400)
RBC: 3.03 MIL/uL — AB (ref 4.22–5.81)
RDW: 12.9 % (ref 11.5–15.5)
WBC: 15.7 10*3/uL — ABNORMAL HIGH (ref 4.0–10.5)

## 2017-02-23 MED ORDER — ORAL CARE MOUTH RINSE
15.0000 mL | Freq: Two times a day (BID) | OROMUCOSAL | Status: DC
  Administered 2017-02-23 – 2017-03-01 (×11): 15 mL via OROMUCOSAL

## 2017-02-23 MED ORDER — WITCH HAZEL-GLYCERIN EX PADS
MEDICATED_PAD | CUTANEOUS | Status: DC | PRN
  Filled 2017-02-23: qty 100

## 2017-02-23 MED ORDER — POLYETHYLENE GLYCOL 3350 17 G PO PACK
17.0000 g | PACK | Freq: Every day | ORAL | Status: DC
  Administered 2017-02-25 – 2017-02-27 (×2): 17 g via ORAL
  Filled 2017-02-23 (×4): qty 1

## 2017-02-23 NOTE — Progress Notes (Signed)
NURSING PROGRESS NOTE  Donald Yeley FoxMRN: [id number] Admission Data: 02/22/17 at 2330 Attending Provider: Debbe Odea, MD PCP: [name], NP Code status: Full  Allergies:  Allergies  Allergen Reactions  . Penicillins Itching and Rash    Has patient had a PCN reaction causing immediate rash, facial/tongue/throat swelling, SOB or lightheadedness with hypotension: No Has patient had a PCN reaction causing severe rash involving mucus membranes or skin necrosis: No Has patient had a PCN reaction that required hospitalization: No Has patient had a PCN reaction occurring within the last 10 years: No If all of the above answers are "NO", then may proceed with Cephalosporin use.     Past Medical History:  Past Medical History:  Diagnosis Date  . ALCOHOL ABUSE   . Anxiety   . Arthritis   . Depression   . Diabetes mellitus   . GERD (gastroesophageal reflux disease)   . Hypertension   . Pneumonia    Past Surgical History:  Past Surgical History:  Procedure Laterality Date  . BACK SURGERY      disc fro picched nerve times 2  . EYE SURGERY Right    Gaucoma  . LUMBAR LAMINECTOMY/DECOMPRESSION MICRODISCECTOMY N/A 01/21/2017   Procedure: Decompression Lumbar 2-3;  Surgeon: Melina Schools, MD;  Location: [facility];  Service: Orthopedics;  Laterality: N/A;  210 mins  . LUMBAR LAMINECTOMY/DECOMPRESSION MICRODISCECTOMY N/A 01/23/2017   Procedure: EXPLORATION AND EVACUATION OF HEMATOMA L2-L3;  Surgeon: Melina Schools, MD;  Location: [facility];  Service: Orthopedics;  Laterality: N/A;  . PROSTATE SURGERY     Donald Calderon is a 76 y.o. male patient, arrived to floor in room 5W03 via stretcher, transferred from ED. Patient alert and oriented X 4. No acute distress noted. Denies pain.   Vital signs: Oral temperature 101 F (38.3 C), Blood pressure 125/66, Pulse 100, RR 21, SpO2 100 % on 2 liters nasal cannula.   Cardiac monitoring:Not ordered by MD.  IV access: Left forearm-infusing  normal saline at 142mL/hr; condition patent and no redness.  Skin: intact, no pressure ulcer noted in sacral area. Hemorrhoids noted at rectum. Second verified by Lelan Pons T.,RN  Patient's ID armband verified with patient and in place. Information packet given to patient. Fall risk assessed, SR up X2, patient able to verbalize understanding of risks associated with falls and to call nurse or staff to assist before getting out of bed. Patient oriented to room and equipment. Call bell within reach.

## 2017-02-23 NOTE — Progress Notes (Signed)
OT Cancellation Note  Patient Details Name: Donald Calderon MRN: [id number] DOB: 1940/07/26   Cancelled Treatment:    Reason Eval/Treat Not Completed: Medical issues which prohibited therapy (Pt with nausea and vomiting. Will follow.)  [name] 02/23/2017, 2:50 PM  02/23/2017 [name], OTR/L Pager: [phone]

## 2017-02-23 NOTE — Telephone Encounter (Signed)
[facility] WITH [facility] CALLED AND NEEDS VERBAL ORDER FOR PT [phone]

## 2017-02-23 NOTE — Progress Notes (Signed)
PT Cancellation Note  Patient Details Name: Donald Calderon MRN: [id number] DOB: 1940-10-28   Cancelled Treatment:    Reason Eval/Treat Not Completed: Medical issues which prohibited therapy. Pt with nausea and vomiting in the AM and when checked again in the PM. Will check back tomorrow.    [place] 02/23/2017, 2:57 PM

## 2017-02-23 NOTE — Progress Notes (Signed)
PROGRESS NOTE    EDD [name]   CLE:[id number]  DOB: [birth year]  DOA: 02/22/2017 PCP: Donald Perna, NP   Brief Narrative:  Donald Calderon 76 y.o. male with medical history significant of HTN, Neurogenic bladder who I and O caths, BPH, DM type II,  who came in to the ER thinking he had a UTI. He has had lower abdominal pain and malaise.   Patient had been admitted on 01/21/17 and had exploration of L3/4 and lumbar decompression L2/L3 by Dr. Rolena Infante. Post op course complicated by increase in leg pain with difficulty walking on 8/11 subsequently found to have a L2 hematoma for which he went back to the OR for evacuation.  Presented with: Fever per EMS 101.5, HR > 100, WBC 16.2 and AKI with cr 2.71.  Subjective: Poor historian. It took a while for him to to tell me that he has had urinary issues since surgery. He initially had a foley and later started I and O caths. He has no complaints other than nausea right now.  ROS: no complaints of  constipation diarrhea, cough, dyspnea or dysuria. No other complaints.   Assessment & Plan:   Principal Problem:   Sepsis secondary to UTI  - due to I and O caths   Bacteremia  - WBC 16.2, temp 101.9, HR > 100 - f/u Urine and blood cultures- once set of blood cultures already growing gram neg rods- d/c Vanc.  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- Urinary retension - 8/11 had to back to OR due evacuate a hematoma causing cord compression as well - 8/14 - transferred to CIR-  per Dr Rolena Infante d/c summary, he did not expect much improvement - 8/31- d/c from CIR- d/c summary states he had improvement in gait and was able to complete ADLs "with supervision" but still had urinary retention- recommended QID I and O cath and Flomax was doubled  BPH - post TURP per Dr Jeffie Pollock- cont Flomax BID which it was increased to while in CIR-     AKI (acute kidney injury) - in setting of ACE I and sepsis - Cr 0.9 at baseline- 2.7 on  admission - renal ultrasound unrevealing - improved only to 2.48- may be ATN- follow- cont IVF for today  Nausea - abdomen distended- Xray from ER unrevealing - Zofran  Peripheral Neuropathy - h/o this as far back at 2013 per notes- was on Neurontin- cont    Diabetes mellitus type 2 in obese - ISS - on Levemir 10 U in AM and 34 in PM- on hold - sugars stable- nauseated and may not eat- follow    Benign essential HTN - Lisinopril on hold  - no Norvasc    Normocytic anemia  - stable   DVT prophylaxis: Heparin Code Status: Full code Family Communication:  Disposition Plan: PT eval to determine disposition Consultants:   none Procedures:    Antimicrobials:  Anti-infectives    Start     Dose/Rate Route Frequency Ordered Stop   02/23/17 2000  ceFEPIme (MAXIPIME) 1 g in dextrose 5 % 50 mL IVPB     1 g 100 mL/hr over 30 Minutes Intravenous Every 24 hours 02/22/17 1911     02/23/17 2000  vancomycin (VANCOCIN) IVPB 750 mg/150 ml premix     750 mg 150 mL/hr over 60 Minutes Intravenous Every 24 hours 02/22/17 1911     02/22/17 2000  vancomycin (VANCOCIN) 1,750 mg in sodium chloride 0.9 % 500 mL  IVPB     1,750 mg 250 mL/hr over 120 Minutes Intravenous  Once 02/22/17 1859 02/22/17 2319   02/22/17 1930  ceFEPIme (MAXIPIME) 1 g in dextrose 5 % 50 mL IVPB     1 g 100 mL/hr over 30 Minutes Intravenous  Once 02/22/17 1859 02/22/17 2105       Objective: Vitals:   02/22/17 2337 02/23/17 0101 02/23/17 0417 02/23/17 0608  BP: 125/66   125/69  Pulse: 100   (!) 105  Resp: (!) 21   18  Temp: (!) 101 F (38.3 C) 99.1 F (37.3 C) 98.6 F (37 C) (!) 101.9 F (38.8 C)  TempSrc: Oral Oral Oral Oral  SpO2: 100%   99%  Weight:      Height:        Intake/Output Summary (Last 24 hours) at 02/23/17 0739 Last data filed at 02/23/17 0254  Gross per 24 hour  Intake              965 ml  Output              651 ml  Net              314 ml   Filed Weights   02/22/17 1702  Weight:  94.3 kg (208 lb)    Examination: General exam: Appears comfortable  HEENT: PERRLA, oral mucosa moist, no sclera icterus or thrush Respiratory system: Clear to auscultation. Respiratory effort normal. Cardiovascular system: S1 & S2 heard, RRR.  No murmurs  Gastrointestinal system: Abdomen soft, non-tender,  Distended and tympanic. Normal bowel sound. No organomegaly Central nervous system: Alert and oriented. No focal neurological deficits. Extremities: No cyanosis, clubbing or edema Skin: No rashes or ulcers Psychiatry:  Mood & affect appropriate.     Data Reviewed: I have personally reviewed following labs and imaging studies  CBC:  Recent Labs Lab 02/22/17 1652 02/22/17 1751  WBC 16.2*  --   NEUTROABS 14.4*  --   HGB 9.2* 9.2*  HCT 28.1* 27.0*  MCV 87.8  --   PLT 206  --    Basic Metabolic Panel:  Recent Labs Lab 02/22/17 1652 02/22/17 1751  NA 131* 132*  K 3.9 3.9  CL 97* 95*  CO2 23  --   GLUCOSE 130* 136*  BUN 41* 38*  CREATININE 2.71* 2.70*  CALCIUM 8.5*  --    GFR: Estimated Creatinine Clearance: 27.7 mL/min (A) (by C-G formula based on SCr of 2.7 mg/dL (H)). Liver Function Tests:  Recent Labs Lab 02/22/17 1652  AST 22  ALT 19  ALKPHOS 78  BILITOT 1.0  PROT 5.8*  ALBUMIN 2.5*   No results for input(s): LIPASE, AMYLASE in the last 168 hours. No results for input(s): AMMONIA in the last 168 hours. Coagulation Profile: No results for input(s): INR, PROTIME in the last 168 hours. Cardiac Enzymes: No results for input(s): CKTOTAL, CKMB, CKMBINDEX, TROPONINI in the last 168 hours. BNP (last 3 results) No results for input(s): PROBNP in the last 8760 hours. HbA1C: No results for input(s): HGBA1C in the last 72 hours. CBG:  Recent Labs Lab 02/22/17 2147 02/22/17 2340  GLUCAP 118* 141*   Lipid Profile: No results for input(s): CHOL, HDL, LDLCALC, TRIG, CHOLHDL, LDLDIRECT in the last 72 hours. Thyroid Function Tests: No results for  input(s): TSH, T4TOTAL, FREET4, T3FREE, THYROIDAB in the last 72 hours. Anemia Panel: No results for input(s): VITAMINB12, FOLATE, FERRITIN, TIBC, IRON, RETICCTPCT in the last 72 hours. Urine analysis:  Component Value Date/Time   COLORURINE AMBER (A) 02/22/2017 1755   APPEARANCEUR TURBID (A) 02/22/2017 1755   LABSPEC 1.012 02/22/2017 1755   PHURINE 5.0 02/22/2017 1755   GLUCOSEU NEGATIVE 02/22/2017 1755   HGBUR LARGE (A) 02/22/2017 1755   HGBUR trace-intact 08/08/2010 1216   BILIRUBINUR NEGATIVE 02/22/2017 1755   KETONESUR NEGATIVE 02/22/2017 1755   PROTEINUR 100 (A) 02/22/2017 1755   UROBILINOGEN 0.2 09/27/2011 1540   NITRITE NEGATIVE 02/22/2017 1755   LEUKOCYTESUR LARGE (A) 02/22/2017 1755   Sepsis Labs: @LABRCNTIP (procalcitonin:4,lacticidven:4) ) Recent Results (from the past 240 hour(s))  Culture, blood (routine x 2)     Status: None (Preliminary result)   Collection Time: 02/22/17  5:45 PM  Result Value Ref Range Status   Specimen Description BLOOD LEFT ARM  Final   Special Requests   Final    BOTTLES DRAWN AEROBIC AND ANAEROBIC Blood Culture results may not be optimal due to an excessive volume of blood received in culture bottles   Culture PENDING  Incomplete   Report Status PENDING  Incomplete         Radiology Studies: Dg Abd 1 View  Result Date: 02/22/2017 CLINICAL DATA:  Pain over the bladder. Patient thinks he has a urinary tract infection. Symptoms began 2 days ago with dark urine. EXAM: ABDOMEN - 1 VIEW COMPARISON:  None. FINDINGS: The bowel gas pattern is unremarkable. No radio-opaque calculi. L4-5 spinal fusion hardware is again noted. No evidence of pneumatosis or pneumoperitoneum. IMPRESSION: Stable appearance of the abdomen and pelvis. No bowel obstruction noted. Electronically Signed   By: Ashley Royalty M.D.   On: 02/22/2017 23:00   US Renal  Result Date: 02/22/2017 CLINICAL DATA:  Acute renal failure EXAM: RENAL / URINARY TRACT ULTRASOUND COMPLETE  COMPARISON:  None. FINDINGS: Right Kidney: Length: 13.5 cm. Cortical-medullary distinction is maintained. No obstructive uropathy. No focal renal mass. No nephrolithiasis. Left Kidney: Length: 12.3 cm. Cortical-medullary distinction is maintained. No obstructive uropathy or focal renal mass. No nephrolithiasis. Bladder: Contracted with Foley catheter in place. Mural thickening may be due to contracted the decompressed appearance of the bladder. IMPRESSION: No sonographic findings for the patient's acute renal failure. Electronically Signed   By: Ashley Royalty M.D.   On: 02/22/2017 23:18   Dg Chest Portable 1 View  Result Date: 02/22/2017 CLINICAL DATA:  76 y/o  M; either. EXAM: PORTABLE CHEST 1 VIEW COMPARISON:  04/11/2010 chest radiograph. FINDINGS: Stable normal cardiac silhouette given projection and technique. Low lung volumes accentuate pulmonary markings. Aortic atherosclerosis with calcification. Minor bibasilar atelectasis. No focal consolidation. No pleural effusion or pneumothorax. No acute osseous abnormality is evident. IMPRESSION: Low lung volumes. Minor bibasilar atelectasis. No focal consolidation. Electronically Signed   By: Kristine Garbe M.D.   On: 02/22/2017 18:07      Scheduled Meds: . amLODipine  10 mg Oral Daily  . gabapentin  100 mg Oral TID  . heparin  5,000 Units Subcutaneous Q8H  . insulin aspart  0-5 Units Subcutaneous QHS  . insulin aspart  0-9 Units Subcutaneous TID WC  . mouth rinse  15 mL Mouth Rinse BID  . pantoprazole  40 mg Oral Daily  . pravastatin  10 mg Oral QPM  . tamsulosin  0.4 mg Oral BID   Continuous Infusions: . sodium chloride 100 mL/hr at 02/23/17 0505  . ceFEPime (MAXIPIME) IV    . vancomycin       LOS: 1 day    Time spent in minutes: 45 min- extensive review  of past records.     Debbe Odea, MD [facility] Pager: [email or web address] Password [facility] 02/23/2017, 7:39 AM

## 2017-02-23 NOTE — Telephone Encounter (Signed)
Even though it's already ordered, she may have a second order for PT.

## 2017-02-23 NOTE — Progress Notes (Signed)
PHARMACY - PHYSICIAN COMMUNICATION CRITICAL VALUE ALERT - BLOOD CULTURE IDENTIFICATION (BCID)  Results for orders placed or performed during the hospital encounter of 02/22/17  Blood Culture ID Panel (Reflexed) (Collected: 02/22/2017  5:45 PM)  Result Value Ref Range   Enterococcus species NOT DETECTED NOT DETECTED   Vancomycin resistance NOT DETECTED NOT DETECTED   Listeria monocytogenes NOT DETECTED NOT DETECTED   Staphylococcus species NOT DETECTED NOT DETECTED   Staphylococcus aureus NOT DETECTED NOT DETECTED   Methicillin resistance NOT DETECTED NOT DETECTED   Streptococcus species NOT DETECTED NOT DETECTED   Streptococcus agalactiae NOT DETECTED NOT DETECTED   Streptococcus pneumoniae NOT DETECTED NOT DETECTED   Streptococcus pyogenes NOT DETECTED NOT DETECTED   Acinetobacter baumannii NOT DETECTED NOT DETECTED   Enterobacteriaceae species DETECTED (A) NOT DETECTED   Enterobacter cloacae complex NOT DETECTED NOT DETECTED   Escherichia coli DETECTED (A) NOT DETECTED   Klebsiella oxytoca NOT DETECTED NOT DETECTED   Klebsiella pneumoniae NOT DETECTED NOT DETECTED   Proteus species NOT DETECTED NOT DETECTED   Serratia marcescens NOT DETECTED NOT DETECTED   Carbapenem resistance NOT DETECTED NOT DETECTED   Haemophilus influenzae NOT DETECTED NOT DETECTED   Neisseria meningitidis NOT DETECTED NOT DETECTED   Pseudomonas aeruginosa NOT DETECTED NOT DETECTED   Candida albicans NOT DETECTED NOT DETECTED   Candida glabrata NOT DETECTED NOT DETECTED   Candida krusei NOT DETECTED NOT DETECTED   Candida parapsilosis NOT DETECTED NOT DETECTED   Candida tropicalis NOT DETECTED NOT DETECTED   Admit with UTI, now with Gram negative bacteremia. E coli detected on BCID. Vancomycin has been discontinued and he is currently on cefepime which could be deescalated to ceftriaxone per BCID treatment algorithm recommendations.  Name of physician (or Provider) Contacted: Dr. Wynelle Cleveland via text  page  Changes to prescribed antibiotics required: Recommended changing Cefepime to Rocephin 2g IV q24h.  Norva Riffle 02/23/2017  11:24 AM

## 2017-02-23 NOTE — Progress Notes (Signed)
[facility]  Patient Status: Active (receiving services up to time of hospitalization)  [facility] is providing the following services: RN, PT and OT  If patient discharges after hours, please call [phone].   Donald Calderon 02/23/2017, 2:22 PM

## 2017-02-23 NOTE — Progress Notes (Signed)
Received report from RN in the ED. 

## 2017-02-24 DIAGNOSIS — N179 Acute kidney failure, unspecified: Secondary | ICD-10-CM

## 2017-02-24 DIAGNOSIS — E669 Obesity, unspecified: Secondary | ICD-10-CM

## 2017-02-24 DIAGNOSIS — E1169 Type 2 diabetes mellitus with other specified complication: Secondary | ICD-10-CM

## 2017-02-24 DIAGNOSIS — I10 Essential (primary) hypertension: Secondary | ICD-10-CM

## 2017-02-24 DIAGNOSIS — N319 Neuromuscular dysfunction of bladder, unspecified: Secondary | ICD-10-CM

## 2017-02-24 LAB — BLOOD CULTURE ID PANEL (REFLEXED)
ACINETOBACTER BAUMANNII: NOT DETECTED
CANDIDA ALBICANS: NOT DETECTED
CANDIDA KRUSEI: NOT DETECTED
CANDIDA PARAPSILOSIS: NOT DETECTED
Candida glabrata: NOT DETECTED
Candida tropicalis: NOT DETECTED
Carbapenem resistance: NOT DETECTED
ENTEROBACTER CLOACAE COMPLEX: NOT DETECTED
ENTEROBACTERIACEAE SPECIES: NOT DETECTED
ENTEROCOCCUS SPECIES: NOT DETECTED
Escherichia coli: NOT DETECTED
Haemophilus influenzae: NOT DETECTED
KLEBSIELLA OXYTOCA: NOT DETECTED
KLEBSIELLA PNEUMONIAE: NOT DETECTED
Listeria monocytogenes: NOT DETECTED
Methicillin resistance: NOT DETECTED
NEISSERIA MENINGITIDIS: NOT DETECTED
PSEUDOMONAS AERUGINOSA: NOT DETECTED
Proteus species: NOT DETECTED
STAPHYLOCOCCUS AUREUS BCID: NOT DETECTED
STREPTOCOCCUS AGALACTIAE: NOT DETECTED
STREPTOCOCCUS PNEUMONIAE: NOT DETECTED
Serratia marcescens: NOT DETECTED
Staphylococcus species: DETECTED — AB
Streptococcus pyogenes: NOT DETECTED
Streptococcus species: NOT DETECTED
Vancomycin resistance: NOT DETECTED

## 2017-02-24 LAB — GLUCOSE, CAPILLARY
GLUCOSE-CAPILLARY: 171 mg/dL — AB (ref 65–99)
GLUCOSE-CAPILLARY: 156 mg/dL — AB (ref 65–99)
Glucose-Capillary: 125 mg/dL — ABNORMAL HIGH (ref 65–99)
Glucose-Capillary: 177 mg/dL — ABNORMAL HIGH (ref 65–99)

## 2017-02-24 MED ORDER — PROMETHAZINE HCL 25 MG/ML IJ SOLN
12.5000 mg | Freq: Once | INTRAMUSCULAR | Status: DC
  Filled 2017-02-24: qty 1

## 2017-02-24 MED ORDER — DEXTROSE 5 % IV SOLN
2.0000 g | INTRAVENOUS | Status: DC
  Administered 2017-02-24: 2 g via INTRAVENOUS
  Filled 2017-02-24 (×2): qty 2

## 2017-02-24 NOTE — Progress Notes (Signed)
PHARMACY - PHYSICIAN COMMUNICATION CRITICAL VALUE ALERT - BLOOD CULTURE IDENTIFICATION (BCID)  Results for orders placed or performed during the hospital encounter of 02/22/17  Blood Culture ID Panel (Reflexed) (Collected: 02/22/2017  5:45 PM)  Result Value Ref Range   Enterococcus species NOT DETECTED NOT DETECTED   Vancomycin resistance NOT DETECTED NOT DETECTED   Listeria monocytogenes NOT DETECTED NOT DETECTED   Staphylococcus species DETECTED (A) NOT DETECTED   Staphylococcus aureus NOT DETECTED NOT DETECTED   Methicillin resistance NOT DETECTED NOT DETECTED   Streptococcus species NOT DETECTED NOT DETECTED   Streptococcus agalactiae NOT DETECTED NOT DETECTED   Streptococcus pneumoniae NOT DETECTED NOT DETECTED   Streptococcus pyogenes NOT DETECTED NOT DETECTED   Acinetobacter baumannii NOT DETECTED NOT DETECTED   Enterobacteriaceae species NOT DETECTED NOT DETECTED   Enterobacter cloacae complex NOT DETECTED NOT DETECTED   Escherichia coli NOT DETECTED NOT DETECTED   Klebsiella oxytoca NOT DETECTED NOT DETECTED   Klebsiella pneumoniae NOT DETECTED NOT DETECTED   Proteus species NOT DETECTED NOT DETECTED   Serratia marcescens NOT DETECTED NOT DETECTED   Carbapenem resistance NOT DETECTED NOT DETECTED   Haemophilus influenzae NOT DETECTED NOT DETECTED   Neisseria meningitidis NOT DETECTED NOT DETECTED   Pseudomonas aeruginosa NOT DETECTED NOT DETECTED   Candida albicans NOT DETECTED NOT DETECTED   Candida glabrata NOT DETECTED NOT DETECTED   Candida krusei NOT DETECTED NOT DETECTED   Candida parapsilosis NOT DETECTED NOT DETECTED   Candida tropicalis NOT DETECTED NOT DETECTED    Name of physician (or Provider) Contacted: K.Schorr NP  Changes to prescribed antibiotics required: Reported by lab to be coag-negative staph, likely contaminant, no changes needed.  Wynona Neat, PharmD, BCPS  02/24/2017  3:59 AM

## 2017-02-24 NOTE — Evaluation (Signed)
Occupational Therapy Evaluation Patient Details Name: Donald Calderon MRN: [id number] DOB: [birth year] Today's Date: 02/24/2017    History of Present Illness pt is a 76 y/o male with pmh significant for HTN, DM2, alcohol abuse, chronic back pain, (recent admission to CIR after lumbar exploration and decompresion at L3/4 and L 2/3, respectively with complication of L2 hematoma necessitating return to OR), now presenting with fever, generalized malaise and c/o lower abdominal pain referred to his back.  Associated symptoms including cloudy urine, N/V and decreased appetitie.  Workin dx incl sepsis due to UTI.   Clinical Impression   Pt re[ports walking with a walker and able to perform self care independently since his discharge from CIR recently. He presents with generalized weakness and poor standing balance without ability to release walker in static standing. Pt requires set up to total assist for ADL. Recommending short stay in CIR for further rehab prior to return home as pt is very unsteady. Will follow.    Follow Up Recommendations  CIR    Equipment Recommendations  None recommended by OT    Recommendations for Other Services       Precautions / Restrictions Precautions Precautions: Fall Restrictions Weight Bearing Restrictions: No      Mobility Bed Mobility        General bed mobility comments: received in chair  Transfers Overall transfer level: Needs assistance Equipment used: Rolling walker (2 wheeled) Transfers: Sit to/from Stand Sit to Stand: Min assist         General transfer comment: assist to steady, increased time, heavy reliance on UEs    Balance Overall balance assessment: Needs assistance   Sitting balance-Leahy Scale: Fair       Standing balance-Leahy Scale: Poor Standing balance comment: heavy reliance on the RW                           ADL either performed or assessed with clinical judgement   ADL Overall ADL's : Needs  assistance/impaired Eating/Feeding: Independent;Sitting   Grooming: Wash/dry hands;Wash/dry face;Sitting;Set up   Upper Body Bathing: Sitting;Minimal assistance   Lower Body Bathing: Total assistance;Sit to/from stand   Upper Body Dressing : Sitting;Set up   Lower Body Dressing: Total assistance;Sit to/from stand   Toilet Transfer: Minimal assistance;Ambulation;RW;BSC   Toileting- Water quality scientist and Hygiene: Sit to/from stand;Total assistance       Functional mobility during ADLs: Minimal assistance;Rolling walker General ADL Comments: pt unable to release walker in standing to pull up pants     Vision Baseline Vision/History: Wears glasses;Glaucoma Wears Glasses: Reading only Patient Visual Report: No change from baseline       Perception     Praxis      Pertinent Vitals/Pain Pain Assessment: Faces Faces Pain Scale: Hurts little more Pain Location: generalized Pain Descriptors / Indicators: Guarding;Grimacing;Discomfort Pain Intervention(s): Monitored during session;Repositioned     Hand Dominance Right   Extremity/Trunk Assessment Upper Extremity Assessment Upper Extremity Assessment: Overall WFL for tasks assessed   Lower Extremity Assessment Lower Extremity Assessment: Defer to PT evaluation RLE Deficits / Details: grossly 4/5 except df 3-, pf 3- RLE Sensation: history of peripheral neuropathy LLE Deficits / Details: hip flexion 3/5, quads 3+, hams 3/5, df 2/5, pf 2/5   Cervical / Trunk Assessment Cervical / Trunk Assessment: Other exceptions (hx of back surgeries)   Communication Communication Communication: No difficulties   Cognition Arousal/Alertness: Awake/alert Behavior During Therapy: WFL for tasks assessed/performed Overall Cognitive Status:  Within Functional Limits for tasks assessed                                     General Comments       Exercises     Shoulder Instructions      Home Living Family/patient  expects to be discharged to:: Private residence Living Arrangements: Spouse/significant other Available Help at Discharge: Family Type of Home: House Home Access: Stairs to enter Technical brewer of Steps: 2 Entrance Stairs-Rails: [place]: One level     Bathroom Shower/Tub: [place] unit;Curtain   Bathroom Toilet: Handicapped height     Home Equipment: Environmental consultant - 2 wheels;Cane - single point;Shower seat;Grab bars - tub/shower;Grab bars - toilet      Lives With: Spouse    Prior Functioning/Environment Level of Independence: Independent with assistive device(s)        Comments: has used the RW post CIR and was performing self care        OT Problem List: Decreased strength;Decreased activity tolerance;Impaired balance (sitting and/or standing);Decreased knowledge of use of DME or AE;Pain;Impaired sensation      OT Treatment/Interventions: Self-care/ADL training;DME and/or AE instruction;Therapeutic activities;Patient/family education;Balance training    OT Goals(Current goals can be found in the care plan section) Acute Rehab OT Goals Patient Stated Goal: walk better OT Goal Formulation: With patient Time For Goal Achievement: 03/10/17 Potential to Achieve Goals: Good ADL Goals Pt Will Perform Grooming: with supervision;standing Pt Will Perform Lower Body Bathing: with supervision;with adaptive equipment;sit to/from stand Pt Will Perform Lower Body Dressing: with supervision;with adaptive equipment;sit to/from stand Pt Will Transfer to Toilet: with supervision;ambulating;bedside commode Pt Will Perform Toileting - Clothing Manipulation and hygiene: with supervision;sit to/from stand  OT Frequency: Min 2X/week   Barriers to D/C:            Co-evaluation              AM-PAC PT "6 Clicks" Daily Activity     Outcome Measure Help from another person eating meals?: None Help from another person taking care of personal grooming?: A  Little Help from another person toileting, which includes using toliet, bedpan, or urinal?: Total Help from another person bathing (including washing, rinsing, drying)?: A Lot Help from another person to put on and taking off regular upper body clothing?: A Little Help from another person to put on and taking off regular lower body clothing?: Total 6 Click Score: 14   End of Session Equipment Utilized During Treatment: Gait belt;Rolling walker  Activity Tolerance: Patient limited by fatigue Patient left: in chair;with call bell/phone within reach;with chair alarm set  OT Visit Diagnosis: Unsteadiness on feet (R26.81);Other abnormalities of gait and mobility (R26.89);Muscle weakness (generalized) (M62.81);Pain                Time: 6270-3500 OT Time Calculation (min): 21 min Charges:  OT General Charges $OT Visit: 1 Visit OT Evaluation $OT Eval Moderate Complexity: 1 Mod G-Codes:     [name] 02/24/2017, 2:54 PM  02/24/2017 [name], OTR/L Pager: [phone]

## 2017-02-24 NOTE — Evaluation (Signed)
Physical Therapy Evaluation Patient Details Name: Donald Calderon MRN: [id number] DOB: January 25, 1941 Today's Date: 02/24/2017   History of Present Illness  pt is a 76 y/o male with pmh significant for HTN, DM2, alcohol abuse, chronic back pain, (recent admission to CIR after lumbar exploration and decompresion at L3/4 and L 2/3, respectively with complication of L2 hematoma necessitating return to OR), now presenting with fever, generalized malaise and c/o lower abdominal pain referred to his back.  Associated symptoms including cloudy urine, N/V and decreased appetitie.  Workin dx incl sepsis due to UTI.  Clinical Impression  Pt admitted with/for weakness, fever and other symptoms found to be due to UTI.  Presently, pt is significantly weak and needing a solid minimal assist for mobility.  Pt currently limited functionally due to the problems listed. ( See problems list.)   Pt will benefit from PT to maximize function and safety in order to get ready for next venue listed below.      Follow Up Recommendations CIR    Equipment Recommendations  None recommended by PT    Recommendations for Other Services Rehab consult     Precautions / Restrictions Precautions Precautions: Fall      Mobility  Bed Mobility Overal bed mobility: Needs Assistance Bed Mobility: Rolling;Sidelying to Sit Rolling: Min assist Sidelying to sit: Mod assist       General bed mobility comments: pt slow and guarded with support needed throughout activity  Transfers Overall transfer level: Needs assistance Equipment used: Rolling walker (2 wheeled) Transfers: Sit to/from Stand Sit to Stand: Min assist         General transfer comment: stability assist  Ambulation/Gait Ambulation/Gait assistance: Min assist Ambulation Distance (Feet): 22 Feet Assistive device: Rolling walker (2 wheeled) Gait Pattern/deviations: Step-through pattern;Decreased step length - right;Decreased step length - left;Decreased  stride length   Gait velocity interpretation: Below normal speed for age/gender General Gait Details: slow, effortful, steps, left noticeably weak.  Generally unsteady with lots of wobble due to little balance correct possible at ankles  Stairs            Wheelchair Mobility    Modified Rankin (Stroke Patients Only)       Balance Overall balance assessment: Needs assistance   Sitting balance-Leahy Scale: Fair       Standing balance-Leahy Scale: Poor Standing balance comment: heavy reliance on the RW                             Pertinent Vitals/Pain Pain Assessment: Faces Faces Pain Scale: Hurts little more Pain Location: vague Pain Descriptors / Indicators: Guarding;Grimacing;Discomfort (gasping) Pain Intervention(s): Monitored during session    Home Living Family/patient expects to be discharged to:: Private residence Living Arrangements: Spouse/significant other Available Help at Discharge: Family Type of Home: House Home Access: Stairs to enter Entrance Stairs-Rails: Psychiatric nurse of Steps: 2 Home Layout: One level Home Equipment: Environmental consultant - 2 wheels;Cane - single point;Shower seat;Grab bars - tub/shower;Grab bars - toilet      Prior Function Level of Independence: Independent with assistive device(s)         Comments: has used the RW post CIR     Hand Dominance   Dominant Hand: Right    Extremity/Trunk Assessment   Upper Extremity Assessment Upper Extremity Assessment: Defer to OT evaluation    Lower Extremity Assessment Lower Extremity Assessment: Generalized weakness;RLE deficits/detail;LLE deficits/detail RLE Deficits / Details: grossly 4/5 except df 3-,  pf 3- RLE Sensation: history of peripheral neuropathy LLE Deficits / Details: hip flexion 3/5, quads 3+, hams 3/5, df 2/5, pf 2/5       Communication   Communication: No difficulties  Cognition Arousal/Alertness: Awake/alert Behavior During Therapy:  WFL for tasks assessed/performed Overall Cognitive Status: Within Functional Limits for tasks assessed                                        General Comments      Exercises     Assessment/Plan    PT Assessment Patient needs continued PT services  PT Problem List Decreased strength;Decreased activity tolerance;Decreased balance;Decreased mobility;Decreased knowledge of use of DME;Pain       PT Treatment Interventions Gait training;DME instruction;Therapeutic activities;Balance training;Stair training;Functional mobility training;Patient/family education    PT Goals (Current goals can be found in the Care Plan section)  Acute Rehab PT Goals Patient Stated Goal: walk better PT Goal Formulation: With patient Time For Goal Achievement: 03/10/17 Potential to Achieve Goals: Good    Frequency Min 3X/week   Barriers to discharge        Co-evaluation               AM-PAC PT "6 Clicks" Daily Activity  Outcome Measure Difficulty turning over in bed (including adjusting bedclothes, sheets and blankets)?: A Lot Difficulty moving from lying on back to sitting on the side of the bed? : Unable Difficulty sitting down on and standing up from a chair with arms (e.g., wheelchair, bedside commode, etc,.)?: A Lot Help needed moving to and from a bed to chair (including a wheelchair)?: A Lot Help needed walking in hospital room?: A Little Help needed climbing 3-5 steps with a railing? : A Lot 6 Click Score: 12    End of Session   Activity Tolerance: Patient tolerated treatment well;Patient limited by fatigue;Patient limited by pain Patient left: in chair;with call bell/phone within reach;with chair alarm set Nurse Communication: Mobility status PT Visit Diagnosis: Unsteadiness on feet (R26.81);Other abnormalities of gait and mobility (R26.89);Muscle weakness (generalized) (M62.81);Other symptoms and signs involving the nervous system (R29.898);Pain Pain - part of  body:  (back)    Time: 1330-1405 PT Time Calculation (min) (ACUTE ONLY): 35 min   Charges:   PT Evaluation $PT Eval Moderate Complexity: 1 Mod PT Treatments $Gait Training: 8-22 mins   PT G CodesTessie Fass Jessel Gettinger 02/24/2017, 2:27 PM  02/24/2017  [name], [place] [phone]  (pager)

## 2017-02-24 NOTE — Progress Notes (Signed)
PROGRESS NOTE    [name]  QBH:[id number] DOB: 19-Apr-1941 DOA: 02/22/2017 PCP: [name], NP    Brief Narrative:  76 year old male who presented with fever. Patient does have a significant past medical history for hypertension, type 2 diabetes mellitus, and chronic back pain. Recent hospitalization August 2018 for L3, L4 exploration, lumbar decompression O9-B3, complicated by L2 hematoma. Patient was successfully surgically intervened, and discharged to rehabilitation. Over last 2 weeks he has been at home, having difficulty ambulating, frequent falls. He has been self catheterizing due to bladder dysfunction and urinary retention. The day of admission he was noted to be febrile, confused and having lower abdominal pain. On the initial physical examination, blood pressure 120/67, heart rate 104, respirations 16, temperature 101.3, oxygen saturation 92%. Dry mucous membranes, lungs were clear to auscultation bilaterally, heart S1-S2 present tachycardic, no cough, rubs or murmurs, gallop was soft, tender to palpation lower quadrants, no mass palpated, no lower extremity edema. His urinalysis had too numerous count white cells.   Patient was admitted to the hospital working diagnosis of sepsis due to urinary tract infection, present on admission.  Assessment & Plan:   Principal Problem:   Sepsis secondary to UTI [facility]) Active Problems:   Chronic back pain   Diabetes mellitus type 2 in obese (HCC)   Benign essential HTN   Normocytic anemia   Neurogenic bladder   AKI (acute kidney injury) ([place])   1. Sepsis due to urinary tract infection, Escherichia coli, present on admission. Will change antibiotic therapy to ceftriaxone IV, will continue to follow on cell count, cultures and temperature curve. Patient more awake and alert, will decrease rate of IV fluids to prevent volume overload, will follow a restrictive IV fluids strategy.   2. Acute kidney injury. Renal function with serum  cr down to 2,4 from 2,7, will continue saline IV, will follow on renal panel in am, avoid hypotension or nephrotoxic medications, serum K at 3,7 and bicarbonate at 22.   3. Hypertension. Will continue blood pressure control, amlodipine 10 mg daily  4. Type 2 diabetes mellitus. Will continue insulin sliding scale for glucose cover and monitoring, patient tolerating po well.  5. Chronic back pain with ambulatory function. Continue pain control and physical therapy evaluation.   6. BPH and bladder dysfunction. Will keep indwelling foley catheter for now. Continue tamsulosin.     DVT prophylaxis: heparin  Code Status: full Family Communication: I spoke with patient's daughter at the bedside and all questions were addressed Disposition Plan: Inpatient reahb   Consultants:     Procedures:     Antimicrobials:   Ceftriaxone     Subjective: Patient is feeling better, lower abdominal pain has decreased in intensity, no dyspnea, no chest pain, no nausea or vomiting. Positive weakness.   Objective: Vitals:   02/23/17 2100 02/24/17 0457 02/24/17 0947 02/24/17 1534  BP: 134/63 123/61 136/66 123/74  Pulse: 95 87  98  Resp: 16 17  17   Temp: 98.3 F (36.8 C) 98.8 F (37.1 C)  99.1 F (37.3 C)  TempSrc: Oral Oral  Oral  SpO2: 97% 97%  96%  Weight:      Height:        Intake/Output Summary (Last 24 hours) at 02/24/17 1537 Last data filed at 02/24/17 1533  Gross per 24 hour  Intake          2830.33 ml  Output  0 ml  Net          2830.33 ml   Filed Weights   02/22/17 1702  Weight: 94.3 kg (208 lb)    Examination:  General: deconditioned Neurology: Awake and alert, non focal  E ENT: no pallor, no icterus, oral mucosa moist Cardiovascular: S1-S2 present, rhythmic, no gallops, rubs, or murmurs. No jugular venous distention, no lower extremity edema. Pulmonary: vesicular breath sounds bilaterally, adequate air movement, no wheezing, rhonchi or  rales. Gastrointestinal. Abdomen flat, no organomegaly, non tender, no rebound or guarding Skin. No rashes Musculoskeletal: no joint deformities     Data Reviewed: I have personally reviewed following labs and imaging studies  CBC:  Recent Labs Lab 02/22/17 1652 02/22/17 1751 02/23/17 0658  WBC 16.2*  --  15.7*  NEUTROABS 14.4*  --   --   HGB 9.2* 9.2* 8.7*  HCT 28.1* 27.0* 26.6*  MCV 87.8  --  87.8  PLT 206  --  161   Basic Metabolic Panel:  Recent Labs Lab 02/22/17 1652 02/22/17 1751 02/23/17 0658  NA 131* 132* 133*  K 3.9 3.9 3.7  CL 97* 95* 101  CO2 23  --  22  GLUCOSE 130* 136* 115*  BUN 41* 38* 39*  CREATININE 2.71* 2.70* 2.48*  CALCIUM 8.5*  --  8.0*   GFR: Estimated Creatinine Clearance: 30.2 mL/min (A) (by C-G formula based on SCr of 2.48 mg/dL (H)). Liver Function Tests:  Recent Labs Lab 02/22/17 1652  AST 22  ALT 19  ALKPHOS 78  BILITOT 1.0  PROT 5.8*  ALBUMIN 2.5*   No results for input(s): LIPASE, AMYLASE in the last 168 hours. No results for input(s): AMMONIA in the last 168 hours. Coagulation Profile: No results for input(s): INR, PROTIME in the last 168 hours. Cardiac Enzymes: No results for input(s): CKTOTAL, CKMB, CKMBINDEX, TROPONINI in the last 168 hours. BNP (last 3 results) No results for input(s): PROBNP in the last 8760 hours. HbA1C: No results for input(s): HGBA1C in the last 72 hours. CBG:  Recent Labs Lab 02/23/17 0745 02/23/17 1219 02/23/17 2156 02/24/17 0742 02/24/17 1207  GLUCAP 108* 114* 137* 125* 171*   Lipid Profile: No results for input(s): CHOL, HDL, LDLCALC, TRIG, CHOLHDL, LDLDIRECT in the last 72 hours. Thyroid Function Tests: No results for input(s): TSH, T4TOTAL, FREET4, T3FREE, THYROIDAB in the last 72 hours. Anemia Panel: No results for input(s): VITAMINB12, FOLATE, FERRITIN, TIBC, IRON, RETICCTPCT in the last 72 hours.    Radiology Studies: I have reviewed all of the imaging during this  hospital visit personally     Scheduled Meds: . amLODipine  10 mg Oral Daily  . gabapentin  100 mg Oral TID  . heparin  5,000 Units Subcutaneous Q8H  . insulin aspart  0-5 Units Subcutaneous QHS  . insulin aspart  0-9 Units Subcutaneous TID WC  . mouth rinse  15 mL Mouth Rinse BID  . pantoprazole  40 mg Oral Daily  . polyethylene glycol  17 g Oral Daily  . pravastatin  10 mg Oral QPM  . promethazine  12.5 mg Intravenous Once  . tamsulosin  0.4 mg Oral BID   Continuous Infusions: . sodium chloride 50 mL/hr at 02/24/17 1526  . cefTRIAXone (ROCEPHIN)  IV       LOS: 2 days        Mauricio Gerome Apley, MD [facility] Pager [phone]

## 2017-02-24 NOTE — Telephone Encounter (Signed)
Called amber for time line on additional pt orders, found out that patient has been re admitted for UTI, additional orders will be reissued on time of release

## 2017-02-24 NOTE — Progress Notes (Addendum)
Rehab Admissions Coordinator Note:  Patient was screened by Cleatrice Burke for appropriateness for an Inpatient Acute Rehab Consult per PT recommendation. Pt recent d/c from inpt rehab and went home with I and O caths. Pt admitted 02/23/17. I would like to see how pt progresses with therapy before assisting with planning rehab dispo options. I will follow. Cleatrice Burke 02/24/2017, 2:33 PM  I can be reached at [phone].

## 2017-02-24 NOTE — Progress Notes (Signed)
I saw and evaluated the patient, I personally obtain the key portions of the history and physical exam, I reviewed with the physician assistant documentation and agree with the physician assistant medical decision-making. Further assessment and plan are as follows:   Please see daily attending daily progress note.  Sander Radon M.D   PROGRESS NOTE  DOREAN DANIELLO KGU:[id number] DOB: 02-Dec-1940 DOA: 02/22/2017 PCP: [name], NP   LOS: 2 days   Brief Narrative / Interim history: 76 year old male with history significant for Hypertension, T2DM, alcohol abuse and chronic back pain.  He presented to ED two days ago with fever of 101.35F, mild tachycardia, lower abdominal pain, lower back pain.  He also reported cloudy urine, nausea, vomiting and decreased appetite. The patient suspected he had UTI. He reports in and out cathing himself about 3 times per day when home due to urinary retention. His sons reported he is unable to adequately self-cath at home and believe he needs a more permanent solution. The family also reported the patient fell a few days ago and hit his head, after which the family noticed he was more confused and unable to give the day and time.    Assessment & Plan: Principal Problem:   Sepsis secondary to UTI [facility]) Active Problems:   Chronic back pain   Diabetes mellitus type 2 in obese (HCC)   Benign essential HTN   Normocytic anemia   Neurogenic bladder   AKI (acute kidney injury) ([place])   Sepsis secondary to UTI due to Self In & Out caths; Gram negative/E.coli Bacteremia - WBC decreased to 15.7 (was 16.2 yesterday). Patient has been afebrile since yesterday evening. - Blood culture detected  E.coli, staphylococcus species and Enterobacteria species - Urine culture positive for >100,000 colonies/mL of gram negative rods - Start Ceftriaxone  Recent L2-L3 Decompression for [place] - urinary retention (01/21/17) - Went back to OR on 8/11 to evacuate  hematoma that was compressing cord  - d/c from CIR on 8/31 with improvement in gait and ability to perform ADLs with supervision; still suffered from urinary retention: QID I&O cath was recommended and Flomax was doubled - PT has been consulted but was unable to evaluate yesterday due to nausea and vomiting; will try again today   BPH - Continue Flomax BID   Acute Kidney Injury - in setting of ACE Inhibitor and sepsis - Creatinine 2.48 yesterday, slightly improved from 2.71 on admission.  BUN 39 yesterday, was 41 on admission.  - Renal US found no acute causes for AKI - Patient has continued to receive fluids, recheck BMP to monitor   Nausea - Continue with Zofran PRN - Abdominal Xray on admission no acute findings   Peripheral Neuropathy - Continue Neurontin   Diabetes Mellitus Type 2 - Continue novolog injection at bedtime and with meals; on sliding scale   Hypertension - stable on Norvasc - Continue holding ACE-I in setting of AKI   Normocytic Anemia - Hemoglobin dropped from 9.2 on admission to 8.7 yesterday- may be dilutional component given IVF hydration. - Continue to monitor, no obvious sources of bleeding   DVT prophylaxis: heparin Code Status: FULL Family Communication: no family at bedside Disposition Plan: unknown at this time  Consultants:   PT  Antimicrobials:  Cefepime   Subjective: Patient states that he is feeling okay this morning but is still experiencing severe nausea and vomiting. He reports he is unable to keep any food down and doesn't have an  appetite.  He also is experiencing some lower abdominal pain that goes to his low back bilaterally. His back pain sometimes radiates to his left leg. He also states he is having swelling in his lower extremities.  He denies chest pain, difficulty breathing, cough, diarrhea, constipation and fever.   Objective: Vitals:   02/23/17 1712 02/23/17 2100 02/24/17 0457 02/24/17 0947  BP:  134/63 123/61  136/66  Pulse:  95 87   Resp:  16 17   Temp: 98.9 F (37.2 C) 98.3 F (36.8 C) 98.8 F (37.1 C)   TempSrc: Oral Oral Oral   SpO2:  97% 97%   Weight:      Height:        Intake/Output Summary (Last 24 hours) at 02/24/17 1013 Last data filed at 02/24/17 0958  Gross per 24 hour  Intake          2608.33 ml  Output              300 ml  Net          2308.33 ml   Filed Weights   02/22/17 1702  Weight: 94.3 kg (208 lb)    Examination:  Vitals:   02/23/17 1712 02/23/17 2100 02/24/17 0457 02/24/17 0947  BP:  134/63 123/61 136/66  Pulse:  95 87   Resp:  16 17   Temp: 98.9 F (37.2 C) 98.3 F (36.8 C) 98.8 F (37.1 C)   TempSrc: Oral Oral Oral   SpO2:  97% 97%   Weight:      Height:        General: Patient sitting in bed; No acute distress Respiratory: Normal work of breathing, Clear to auscultation bilaterally, no wheezing, rales or rhonchi. Unable to listen to posterior lung fields due to abdominal and back pain with leaning forward.  Cardiovascular: S1 & S2 present, RRR, no murmurs, rubs or gallops. 2+ pitting edema Abdomen: Abdomen round and feels somewhat distended; bowel sounds present all 4 quadrants; tenderness to palpation RLQ, LLQ, and suprapubic region.  No rebound or guarding. No tenderness RUQ, LUQ or epigastric region Musculoskeletal:  Patient reports tenderness on palpation of bilateraly lower extremities while checking for edema.  Skin: no rashes, lesions, ulcers. No induration Neurologic: sensation to light touch intact in both lower extremities.  Psychiatric: Appropriate mood. Alert, cooperative and responsive to questions.    Data Reviewed: I have independently reviewed following labs and imaging studies  CBC:  Recent Labs Lab 02/22/17 1652 02/22/17 1751 02/23/17 0658  WBC 16.2*  --  15.7*  NEUTROABS 14.4*  --   --   HGB 9.2* 9.2* 8.7*  HCT 28.1* 27.0* 26.6*  MCV 87.8  --  87.8  PLT 206  --  109   Basic Metabolic Panel:  Recent Labs Lab  02/22/17 1652 02/22/17 1751 02/23/17 0658  NA 131* 132* 133*  K 3.9 3.9 3.7  CL 97* 95* 101  CO2 23  --  22  GLUCOSE 130* 136* 115*  BUN 41* 38* 39*  CREATININE 2.71* 2.70* 2.48*  CALCIUM 8.5*  --  8.0*   GFR: Estimated Creatinine Clearance: 30.2 mL/min (A) (by C-G formula based on SCr of 2.48 mg/dL (H)). Liver Function Tests:  Recent Labs Lab 02/22/17 1652  AST 22  ALT 19  ALKPHOS 78  BILITOT 1.0  PROT 5.8*  ALBUMIN 2.5*   No results for input(s): LIPASE, AMYLASE in the last 168 hours. No results for input(s): AMMONIA in the last 168 hours.  Coagulation Profile: No results for input(s): INR, PROTIME in the last 168 hours. Cardiac Enzymes: No results for input(s): CKTOTAL, CKMB, CKMBINDEX, TROPONINI in the last 168 hours. BNP (last 3 results) No results for input(s): PROBNP in the last 8760 hours. HbA1C: No results for input(s): HGBA1C in the last 72 hours. CBG:  Recent Labs Lab 02/22/17 2340 02/23/17 0745 02/23/17 1219 02/23/17 2156 02/24/17 0742  GLUCAP 141* 108* 114* 137* 125*   Lipid Profile: No results for input(s): CHOL, HDL, LDLCALC, TRIG, CHOLHDL, LDLDIRECT in the last 72 hours. Thyroid Function Tests: No results for input(s): TSH, T4TOTAL, FREET4, T3FREE, THYROIDAB in the last 72 hours. Anemia Panel: No results for input(s): VITAMINB12, FOLATE, FERRITIN, TIBC, IRON, RETICCTPCT in the last 72 hours. Urine analysis:    Component Value Date/Time   COLORURINE AMBER (A) 02/22/2017 1755   APPEARANCEUR TURBID (A) 02/22/2017 1755   LABSPEC 1.012 02/22/2017 1755   PHURINE 5.0 02/22/2017 1755   GLUCOSEU NEGATIVE 02/22/2017 1755   HGBUR LARGE (A) 02/22/2017 1755   HGBUR trace-intact 08/08/2010 1216   BILIRUBINUR NEGATIVE 02/22/2017 1755   KETONESUR NEGATIVE 02/22/2017 1755   PROTEINUR 100 (A) 02/22/2017 1755   UROBILINOGEN 0.2 09/27/2011 1540   NITRITE NEGATIVE 02/22/2017 1755   LEUKOCYTESUR LARGE (A) 02/22/2017 1755   Sepsis Labs: Invalid  input(s): PROCALCITONIN, LACTICIDVEN  Recent Results (from the past 240 hour(s))  Culture, blood (routine x 2)     Status: None (Preliminary result)   Collection Time: 02/22/17  5:15 PM  Result Value Ref Range Status   Specimen Description BLOOD RIGHT ARM  Final   Special Requests   Final    BOTTLES DRAWN AEROBIC AND ANAEROBIC Blood Culture results may not be optimal due to an excessive volume of blood received in culture bottles   Culture NO GROWTH < 24 HOURS  Final   Report Status PENDING  Incomplete  Culture, blood (routine x 2)     Status: Abnormal (Preliminary result)   Collection Time: 02/22/17  5:45 PM  Result Value Ref Range Status   Specimen Description BLOOD LEFT ARM  Final   Special Requests   Final    BOTTLES DRAWN AEROBIC AND ANAEROBIC Blood Culture results may not be optimal due to an excessive volume of blood received in culture bottles   Culture  Setup Time   Final    GRAM NEGATIVE RODS AEROBIC BOTTLE ONLY CRITICAL RESULT CALLED TO, READ BACK BY AND VERIFIED WITH: PHARMD M TURNER [id number] MLM GRAM POSITIVE COCCI IN CLUSTERS ANAEROBIC BOTTLE ONLY CRITICAL RESULT CALLED TO, READ BACK BY AND VERIFIED WITH: V.BRYK PHARMD 02/24/17 0342 L.CHAMPION    Culture (A)  Final    ESCHERICHIA COLI GRAM POSITIVE COCCI IN CLUSTERS    Report Status PENDING  Incomplete  Blood Culture ID Panel (Reflexed)     Status: Abnormal   Collection Time: 02/22/17  5:45 PM  Result Value Ref Range Status   Enterococcus species NOT DETECTED NOT DETECTED Final   Vancomycin resistance NOT DETECTED NOT DETECTED Final   Listeria monocytogenes NOT DETECTED NOT DETECTED Final   Staphylococcus species NOT DETECTED NOT DETECTED Final   Staphylococcus aureus NOT DETECTED NOT DETECTED Final   Methicillin resistance NOT DETECTED NOT DETECTED Final   Streptococcus species NOT DETECTED NOT DETECTED Final   Streptococcus agalactiae NOT DETECTED NOT DETECTED Final   Streptococcus pneumoniae NOT DETECTED  NOT DETECTED Final   Streptococcus pyogenes NOT DETECTED NOT DETECTED Final   Acinetobacter baumannii NOT DETECTED NOT DETECTED  Final   Enterobacteriaceae species DETECTED (A) NOT DETECTED Final    Comment: CRITICAL RESULT CALLED TO, READ BACK BY AND VERIFIED WITH: PHARMD M TURNER [id number] MLM    Enterobacter cloacae complex NOT DETECTED NOT DETECTED Final   Escherichia coli DETECTED (A) NOT DETECTED Final    Comment: CRITICAL RESULT CALLED TO, READ BACK BY AND VERIFIED WITH: PHARMD M TURNER [id number] MLM    Klebsiella oxytoca NOT DETECTED NOT DETECTED Final   Klebsiella pneumoniae NOT DETECTED NOT DETECTED Final   Proteus species NOT DETECTED NOT DETECTED Final   Serratia marcescens NOT DETECTED NOT DETECTED Final   Carbapenem resistance NOT DETECTED NOT DETECTED Final   Haemophilus influenzae NOT DETECTED NOT DETECTED Final   Neisseria meningitidis NOT DETECTED NOT DETECTED Final   Pseudomonas aeruginosa NOT DETECTED NOT DETECTED Final   Candida albicans NOT DETECTED NOT DETECTED Final   Candida glabrata NOT DETECTED NOT DETECTED Final   Candida krusei NOT DETECTED NOT DETECTED Final   Candida parapsilosis NOT DETECTED NOT DETECTED Final   Candida tropicalis NOT DETECTED NOT DETECTED Final  Blood Culture ID Panel (Reflexed)     Status: Abnormal   Collection Time: 02/22/17  5:45 PM  Result Value Ref Range Status   Enterococcus species NOT DETECTED NOT DETECTED Final   Vancomycin resistance NOT DETECTED NOT DETECTED Final   Listeria monocytogenes NOT DETECTED NOT DETECTED Final   Staphylococcus species DETECTED (A) NOT DETECTED Final    Comment: CRITICAL RESULT CALLED TO, READ BACK BY AND VERIFIED WITH: V.BRYK PHARMD 02/24/17 0342 L.CHAMPION    Staphylococcus aureus NOT DETECTED NOT DETECTED Final   Methicillin resistance NOT DETECTED NOT DETECTED Final   Streptococcus species NOT DETECTED NOT DETECTED Final   Streptococcus agalactiae NOT DETECTED NOT DETECTED Final    Streptococcus pneumoniae NOT DETECTED NOT DETECTED Final   Streptococcus pyogenes NOT DETECTED NOT DETECTED Final   Acinetobacter baumannii NOT DETECTED NOT DETECTED Final   Enterobacteriaceae species NOT DETECTED NOT DETECTED Final   Enterobacter cloacae complex NOT DETECTED NOT DETECTED Final   Escherichia coli NOT DETECTED NOT DETECTED Final   Klebsiella oxytoca NOT DETECTED NOT DETECTED Final   Klebsiella pneumoniae NOT DETECTED NOT DETECTED Final   Proteus species NOT DETECTED NOT DETECTED Final   Serratia marcescens NOT DETECTED NOT DETECTED Final   Carbapenem resistance NOT DETECTED NOT DETECTED Final   Haemophilus influenzae NOT DETECTED NOT DETECTED Final   Neisseria meningitidis NOT DETECTED NOT DETECTED Final   Pseudomonas aeruginosa NOT DETECTED NOT DETECTED Final   Candida albicans NOT DETECTED NOT DETECTED Final   Candida glabrata NOT DETECTED NOT DETECTED Final   Candida krusei NOT DETECTED NOT DETECTED Final   Candida parapsilosis NOT DETECTED NOT DETECTED Final   Candida tropicalis NOT DETECTED NOT DETECTED Final  Urine culture     Status: Abnormal (Preliminary result)   Collection Time: 02/22/17  5:55 PM  Result Value Ref Range Status   Specimen Description URINE, CATHETERIZED  Final   Special Requests NONE  Final   Culture >=100,000 COLONIES/mL GRAM NEGATIVE RODS (A)  Final   Report Status PENDING  Incomplete      Radiology Studies: Dg Abd 1 View  Result Date: 02/22/2017 CLINICAL DATA:  Pain over the bladder. Patient thinks he has a urinary tract infection. Symptoms began 2 days ago with dark urine. EXAM: ABDOMEN - 1 VIEW COMPARISON:  None. FINDINGS: The bowel gas pattern is unremarkable. No radio-opaque calculi. L4-5 spinal fusion hardware is again noted.  No evidence of pneumatosis or pneumoperitoneum. IMPRESSION: Stable appearance of the abdomen and pelvis. No bowel obstruction noted. Electronically Signed   By: Ashley Royalty M.D.   On: 02/22/2017 23:00   US  Renal  Result Date: 02/22/2017 CLINICAL DATA:  Acute renal failure EXAM: RENAL / URINARY TRACT ULTRASOUND COMPLETE COMPARISON:  None. FINDINGS: Right Kidney: Length: 13.5 cm. Cortical-medullary distinction is maintained. No obstructive uropathy. No focal renal mass. No nephrolithiasis. Left Kidney: Length: 12.3 cm. Cortical-medullary distinction is maintained. No obstructive uropathy or focal renal mass. No nephrolithiasis. Bladder: Contracted with Foley catheter in place. Mural thickening may be due to contracted the decompressed appearance of the bladder. IMPRESSION: No sonographic findings for the patient's acute renal failure. Electronically Signed   By: Ashley Royalty M.D.   On: 02/22/2017 23:18   Dg Chest Portable 1 View  Result Date: 02/22/2017 CLINICAL DATA:  76 y/o  M; either. EXAM: PORTABLE CHEST 1 VIEW COMPARISON:  04/11/2010 chest radiograph. FINDINGS: Stable normal cardiac silhouette given projection and technique. Low lung volumes accentuate pulmonary markings. Aortic atherosclerosis with calcification. Minor bibasilar atelectasis. No focal consolidation. No pleural effusion or pneumothorax. No acute osseous abnormality is evident. IMPRESSION: Low lung volumes. Minor bibasilar atelectasis. No focal consolidation. Electronically Signed   By: Kristine Garbe M.D.   On: 02/22/2017 18:07     Scheduled Meds: . amLODipine  10 mg Oral Daily  . gabapentin  100 mg Oral TID  . heparin  5,000 Units Subcutaneous Q8H  . insulin aspart  0-5 Units Subcutaneous QHS  . insulin aspart  0-9 Units Subcutaneous TID WC  . mouth rinse  15 mL Mouth Rinse BID  . pantoprazole  40 mg Oral Daily  . polyethylene glycol  17 g Oral Daily  . pravastatin  10 mg Oral QPM  . promethazine  12.5 mg Intravenous Once  . tamsulosin  0.4 mg Oral BID   Continuous Infusions: . sodium chloride 100 mL/hr at 02/23/17 1849  . ceFEPime (MAXIPIME) IV Stopped (02/23/17 2011)       [facility],  PA-Student 02/24/2017, 10:13 AM   [email or web address] @

## 2017-02-25 ENCOUNTER — Inpatient Hospital Stay (HOSPITAL_COMMUNITY): Payer: Medicare HMO

## 2017-02-25 DIAGNOSIS — A419 Sepsis, unspecified organism: Secondary | ICD-10-CM

## 2017-02-25 DIAGNOSIS — D649 Anemia, unspecified: Secondary | ICD-10-CM

## 2017-02-25 DIAGNOSIS — N390 Urinary tract infection, site not specified: Secondary | ICD-10-CM

## 2017-02-25 DIAGNOSIS — K567 Ileus, unspecified: Secondary | ICD-10-CM

## 2017-02-25 DIAGNOSIS — G8929 Other chronic pain: Secondary | ICD-10-CM

## 2017-02-25 DIAGNOSIS — M545 Low back pain: Secondary | ICD-10-CM

## 2017-02-25 LAB — CBC WITH DIFFERENTIAL/PLATELET
BASOS ABS: 0 10*3/uL (ref 0.0–0.1)
Basophils Relative: 0 %
EOS PCT: 0 %
Eosinophils Absolute: 0.1 10*3/uL (ref 0.0–0.7)
HEMATOCRIT: 28.3 % — AB (ref 39.0–52.0)
Hemoglobin: 9.5 g/dL — ABNORMAL LOW (ref 13.0–17.0)
LYMPHS ABS: 0.9 10*3/uL (ref 0.7–4.0)
LYMPHS PCT: 5 %
MCH: 29.2 pg (ref 26.0–34.0)
MCHC: 33.6 g/dL (ref 30.0–36.0)
MCV: 87.1 fL (ref 78.0–100.0)
MONO ABS: 1.4 10*3/uL — AB (ref 0.1–1.0)
MONOS PCT: 8 %
NEUTROS ABS: 15.4 10*3/uL — AB (ref 1.7–7.7)
Neutrophils Relative %: 87 %
Platelets: 276 10*3/uL (ref 150–400)
RBC: 3.25 MIL/uL — ABNORMAL LOW (ref 4.22–5.81)
RDW: 13.2 % (ref 11.5–15.5)
WBC: 17.7 10*3/uL — ABNORMAL HIGH (ref 4.0–10.5)

## 2017-02-25 LAB — GLUCOSE, CAPILLARY
GLUCOSE-CAPILLARY: 136 mg/dL — AB (ref 65–99)
Glucose-Capillary: 144 mg/dL — ABNORMAL HIGH (ref 65–99)
Glucose-Capillary: 132 mg/dL — ABNORMAL HIGH (ref 65–99)
Glucose-Capillary: 160 mg/dL — ABNORMAL HIGH (ref 65–99)

## 2017-02-25 LAB — BASIC METABOLIC PANEL
ANION GAP: 9 (ref 5–15)
BUN: 39 mg/dL — ABNORMAL HIGH (ref 6–20)
CALCIUM: 8.1 mg/dL — AB (ref 8.9–10.3)
CO2: 24 mmol/L (ref 22–32)
Chloride: 102 mmol/L (ref 101–111)
Creatinine, Ser: 2.03 mg/dL — ABNORMAL HIGH (ref 0.61–1.24)
GFR calc Af Amer: 35 mL/min — ABNORMAL LOW (ref >60)
GFR, EST NON AFRICAN AMERICAN: 30 mL/min — AB (ref >60)
GLUCOSE: 143 mg/dL — AB (ref 65–99)
Potassium: 3.4 mmol/L — ABNORMAL LOW (ref 3.5–5.1)
Sodium: 135 mmol/L (ref 135–145)

## 2017-02-25 LAB — URINE CULTURE: Culture: >=100000 — AB

## 2017-02-25 MED ORDER — CEFAZOLIN SODIUM-DEXTROSE 1-4 GM/50ML-% IV SOLN
1.0000 g | Freq: Three times a day (TID) | INTRAVENOUS | Status: DC
  Administered 2017-02-25 – 2017-03-01 (×11): 1 g via INTRAVENOUS
  Filled 2017-02-25 (×12): qty 50

## 2017-02-25 MED ORDER — BISACODYL 10 MG RE SUPP
10.0000 mg | Freq: Once | RECTAL | Status: AC
  Administered 2017-02-25: 10 mg via RECTAL
  Filled 2017-02-25: qty 1

## 2017-02-25 MED ORDER — TRAZODONE HCL 50 MG PO TABS
50.0000 mg | ORAL_TABLET | Freq: Once | ORAL | Status: AC
  Administered 2017-02-26: 50 mg via ORAL
  Filled 2017-02-25: qty 1

## 2017-02-25 MED ORDER — LIDOCAINE VISCOUS 2 % MT SOLN
15.0000 mL | OROMUCOSAL | Status: DC | PRN
  Filled 2017-02-25 (×2): qty 15

## 2017-02-25 MED ORDER — POTASSIUM CHLORIDE CRYS ER 20 MEQ PO TBCR
20.0000 meq | EXTENDED_RELEASE_TABLET | Freq: Once | ORAL | Status: AC
  Administered 2017-02-25: 20 meq via ORAL
  Filled 2017-02-25: qty 1

## 2017-02-25 MED ORDER — SIMETHICONE 80 MG PO CHEW
160.0000 mg | CHEWABLE_TABLET | Freq: Four times a day (QID) | ORAL | Status: DC | PRN
  Administered 2017-02-25 (×2): 160 mg via ORAL
  Filled 2017-02-25 (×2): qty 2

## 2017-02-25 NOTE — Progress Notes (Addendum)
PROGRESS NOTE    Donald Calderon  STM:[id number] DOB: Nov 11, 1940 DOA: 02/22/2017 PCP: [name], NP    Brief Narrative:  76 year old male who presented with fever. Patient does have a significant past medical history for hypertension, type 2 diabetes mellitus, and chronic back pain. Recent hospitalization August 2018 for L3, L4 exploration, lumbar decompression G9-Q1, complicated by L2 hematoma. Patient was successfully surgically intervened, and discharged to rehabilitation. Over last 2 weeks he has been at home, having difficulty ambulating, frequent falls. He has been self catheterizing due to bladder dysfunction and urinary retention. The day of admission he was noted to be febrile, confused and having lower abdominal pain. On the initial physical examination, blood pressure 120/67, heart rate 104, respirations 16, temperature 101.3, oxygen saturation 92%. Dry mucous membranes, lungs were clear to auscultation bilaterally, heart S1-S2 present tachycardic, no cough, rubs or murmurs, gallop was soft, tender to palpation lower quadrants, no mass palpated, no lower extremity edema. His urinalysis had too numerous count white cells.   Patient was admitted to the hospital working diagnosis of sepsis due to urinary tract infection, present on admission.   Assessment & Plan:   Principal Problem:   Sepsis secondary to UTI [facility]) Active Problems:   Chronic back pain   Diabetes mellitus type 2 in obese (HCC)   Benign essential HTN   Normocytic anemia   Neurogenic bladder   AKI (acute kidney injury) ([place])  1. Sepsis due to urinary tract infection, Escherichia coli, present on admission. Continue antibiotic therapy with  cefazolin IV, will continue IV fluids with isotonic solutions. Worsening pain and leukocytosis, will check renal US. Improved metabolic/ septic encephalopathy.   2. Acute kidney injury. Renal function with serum cr at 2.0 from 2,48, will follow on renal panel in am, will  increase IV fluids to 75 cc due to new onset ileus, will follow renal panel in am, avoid hypotension or nephrotoxic medications.  3. New onset of ileus. Abdominal film with ileus, no signs of obstruction per imaging, will place NG tube for low intermittent suction, will follow on abdominal film in am. If persistent or worsening may need surgical consultation.   3. Hypertension. Continue with amlodipine 10 mg daily, for blood pressure control.   4. Type 2 diabetes mellitus. Capillary glucose 171, 156, 177, 144, 132. On insulin sliding scale for glucose cover and monitoring, will place patient NPO for now, due to ileus.   5. Chronic back pain with ambulatory function. Will continue routine analgesia, out of bed as tolerated.    6. BPH and bladder dysfunction. Will keep foley catheter in place, positive abdominal pain and worsening leukocytosis, will check renal US to rule out obstructive uropathy or deep infection.    DVT prophylaxis: heparin  Code Status: full Family Communication: I spoke with patient's daughter at the bedside and all questions were addressed Disposition Plan: Inpatient reahb   Consultants:     Procedures:     Antimicrobials:   Ceftriaxone      Subjective: Patient this am with abdominal pain and distention, moderate in intensity  Objective: Vitals:   02/24/17 2104 02/25/17 0231 02/25/17 0546 02/25/17 0850  BP: 136/69  131/67 111/61  Pulse: 92  89   Resp: 17  18   Temp: 100.2 F (37.9 C) 98.8 F (37.1 C) 98.6 F (37 C)   TempSrc: Oral Oral Oral   SpO2: 95%  94%   Weight:      Height:  Intake/Output Summary (Last 24 hours) at 02/25/17 0854 Last data filed at 02/24/17 1852  Gross per 24 hour  Intake           1239.5 ml  Output              800 ml  Net            439.5 ml   Filed Weights   02/22/17 1702  Weight: 94.3 kg (208 lb)    Examination:  General: deconditioned and ill looking appearing.  Neurology: Awake and  alert, non focal  E ENT: mild pallor, no icterus, oral mucosa dry.  Cardiovascular: S1-S2 present, rhythmic, no gallops, rubs, or murmurs. No jugular venous distention, no lower extremity edema. Pulmonary: vesicular breath sounds bilaterally, adequate air movement, no wheezing, rhonchi or rales. Gastrointestinal. Abdomen is distended and tympanic, increase bowel sounds, no organomegaly, non tender, no rebound or guarding.  Skin. No rashes Musculoskeletal: no joint deformities     Data Reviewed: I have personally reviewed following labs and imaging studies  CBC:  Recent Labs Lab 02/22/17 1652 02/22/17 1751 02/23/17 0658 02/25/17 0410  WBC 16.2*  --  15.7* 17.7*  NEUTROABS 14.4*  --   --  15.4*  HGB 9.2* 9.2* 8.7* 9.5*  HCT 28.1* 27.0* 26.6* 28.3*  MCV 87.8  --  87.8 87.1  PLT 206  --  211 295   Basic Metabolic Panel:  Recent Labs Lab 02/22/17 1652 02/22/17 1751 02/23/17 0658 02/25/17 0410  NA 131* 132* 133* 135  K 3.9 3.9 3.7 3.4*  CL 97* 95* 101 102  CO2 23  --  22 24  GLUCOSE 130* 136* 115* 143*  BUN 41* 38* 39* 39*  CREATININE 2.71* 2.70* 2.48* 2.03*  CALCIUM 8.5*  --  8.0* 8.1*   GFR: Estimated Creatinine Clearance: 36.9 mL/min (A) (by C-G formula based on SCr of 2.03 mg/dL (H)). Liver Function Tests:  Recent Labs Lab 02/22/17 1652  AST 22  ALT 19  ALKPHOS 78  BILITOT 1.0  PROT 5.8*  ALBUMIN 2.5*   No results for input(s): LIPASE, AMYLASE in the last 168 hours. No results for input(s): AMMONIA in the last 168 hours. Coagulation Profile: No results for input(s): INR, PROTIME in the last 168 hours. Cardiac Enzymes: No results for input(s): CKTOTAL, CKMB, CKMBINDEX, TROPONINI in the last 168 hours. BNP (last 3 results) No results for input(s): PROBNP in the last 8760 hours. HbA1C: No results for input(s): HGBA1C in the last 72 hours. CBG:  Recent Labs Lab 02/24/17 0742 02/24/17 1207 02/24/17 1649 02/24/17 2103 02/25/17 0747  GLUCAP 125*  171* 156* 177* 144*   Lipid Profile: No results for input(s): CHOL, HDL, LDLCALC, TRIG, CHOLHDL, LDLDIRECT in the last 72 hours. Thyroid Function Tests: No results for input(s): TSH, T4TOTAL, FREET4, T3FREE, THYROIDAB in the last 72 hours. Anemia Panel: No results for input(s): VITAMINB12, FOLATE, FERRITIN, TIBC, IRON, RETICCTPCT in the last 72 hours.    Radiology Studies: I have reviewed all of the imaging during this hospital visit personally     Scheduled Meds: . amLODipine  10 mg Oral Daily  . bisacodyl  10 mg Rectal Once  . gabapentin  100 mg Oral TID  . heparin  5,000 Units Subcutaneous Q8H  . insulin aspart  0-5 Units Subcutaneous QHS  . insulin aspart  0-9 Units Subcutaneous TID WC  . mouth rinse  15 mL Mouth Rinse BID  . pantoprazole  40 mg Oral Daily  . polyethylene  glycol  17 g Oral Daily  . pravastatin  10 mg Oral QPM  . promethazine  12.5 mg Intravenous Once  . tamsulosin  0.4 mg Oral BID   Continuous Infusions: . sodium chloride 50 mL/hr at 02/24/17 1526  . cefTRIAXone (ROCEPHIN)  IV 2 g (02/24/17 1740)     LOS: 3 days        Mauricio Gerome Apley, MD [facility] Pager [phone]

## 2017-02-25 NOTE — Progress Notes (Signed)
Patient c/o gas pain, wonders if he can have any medication for this.  RN paged on call provider K Schorr as nothing is ordered for this symptom PRN, awaiting response.  P.J. Linus Mako, RN

## 2017-02-25 NOTE — Progress Notes (Signed)
Attempted to insert NG tube x3 unsuccessful,MD notified

## 2017-02-25 NOTE — Progress Notes (Signed)
PT Cancellation Note  Patient Details Name: Donald Calderon MRN: [id number] DOB: June 10, 1941   Cancelled Treatment:    Reason Eval/Treat Not Completed: Patient at procedure or test/unavailable.  MD in with pt in discussion about surgery tomorrow. 02/25/2017  [name], PT [phone]  (pager)   Donald Calderon 02/25/2017, 5:41 PM

## 2017-02-25 NOTE — Progress Notes (Signed)
RN paged K Schorr to make her aware patient's abdomen very distended, BS x4, per patient no good BM since 02/21/2017.  Simethicone helped some with gas pain per patient, requesting something to help with bowel movement, no PRN stool softener noted. Awaiting response, will continue to monitor.  P.J. Linus Mako, RN

## 2017-02-25 NOTE — Consult Note (Signed)
Physical Medicine and Rehabilitation Consult   Reason for Consult: Deficits in mobility due to sepsis.  Referring Physician: Dr. Cathlean Sauer   HPI: Donald Calderon is a 76 y.o. male with history of T2DM, HTN, chronic back pain with recent L2/L3 decompression complicated by hematoma with neurogenic bowel and bladder, CIR stay 8/14- 8/31 significant for ileus and urinary retention--discharged to home at supervision level. He was readmitted on 02/22/17 with fever with leucocytosis, fall, confusion and noted to be septic due to E coli and Enterobacter bacteremia.   Patient reports that he's been performing I/O caths  2-3 times a day and family reported difficulty performing catheterization. He was started on IVF for acute renal failure, foley placed for urinary retention but continues to have abdominal discomfort with N/V.  Therapy evaluations done revealing deficits in mobility and self-care tasks. CIR recommended by rehab team.   According to the patient's son, the patient required some physical assistance for standing at home. The son stated this was since discharge from [place].  . The patient reports that he was home by himself, even though he was discharged at a supervision level.  Patient is about to get a NG tube placed for gastric distention/ileus   Review of Systems  HENT: Negative for hearing loss and tinnitus.   Eyes: Negative for blurred vision and double vision.  Respiratory: Negative for cough and shortness of breath.   Cardiovascular: Negative for chest pain and palpitations.  Gastrointestinal: Positive for abdominal pain, constipation, nausea and vomiting.  Genitourinary: Negative for dysuria and urgency.  Musculoskeletal: Negative for back pain and myalgias.  Skin: Negative for itching and rash.  Neurological: Positive for sensory change and focal weakness. Negative for speech change.  Psychiatric/Behavioral: The patient is nervous/anxious.     Past Medical History:  Diagnosis  Date  . ALCOHOL ABUSE   . Anxiety   . Arthritis   . Depression   . Diabetes mellitus   . GERD (gastroesophageal reflux disease)   . Hypertension   . Pneumonia     Past Surgical History:  Procedure Laterality Date  . BACK SURGERY      disc fro picched nerve times 2  . EYE SURGERY Right    Gaucoma  . LUMBAR LAMINECTOMY/DECOMPRESSION MICRODISCECTOMY N/A 01/21/2017   Procedure: Decompression Lumbar 2-3;  Surgeon: Melina Schools, MD;  Location: [place];  Service: Orthopedics;  Laterality: N/A;  210 mins  . LUMBAR LAMINECTOMY/DECOMPRESSION MICRODISCECTOMY N/A 01/23/2017   Procedure: EXPLORATION AND EVACUATION OF HEMATOMA L2-L3;  Surgeon: Melina Schools, MD;  Location: [place];  Service: Orthopedics;  Laterality: N/A;  . PROSTATE SURGERY      Family History  Problem Relation Age of Onset  . Diabetes Mother   . Diabetes Father     Social History:   Married. He reports that he quit smoking about 18 years ago. His smoking use included Cigarettes. He has a 39.00 pack-year smoking history. He quit smokeless tobacco use about 19 years ago. His smokeless tobacco use included Chew. He reports that he has not been using any alcohol. He reports that he does not use drugs.   Allergies  Allergen Reactions  . Penicillins Itching and Rash    Has patient had a PCN reaction causing immediate rash, facial/tongue/throat swelling, SOB or lightheadedness with hypotension: No Has patient had a PCN reaction causing severe rash involving mucus membranes or skin necrosis: No Has patient had a PCN reaction that required hospitalization: No Has patient had a  PCN reaction occurring within the last 10 years: No If all of the above answers are "NO", then may proceed with Cephalosporin use.       Medications Prior to Admission  Medication Sig Dispense Refill  . acetaminophen (TYLENOL) 325 MG tablet Take 1-2 tablets (325-650 mg total) by mouth every 4 (four) hours as needed for mild pain.    [name] amLODipine  (NORVASC) 10 MG tablet Take 1 tablet (10 mg total) by mouth daily. 30 tablet 0  . bethanechol (URECHOLINE) 25 MG tablet Take 1 tablet (25 mg total) by mouth 3 (three) times daily. 90 tablet 1  . Calcium Carbonate Antacid (ALKA-SELTZER ANTACID PO) Take 1 tablet by mouth as needed (heartburn).    . cyclobenzaprine (FLEXERIL) 5 MG tablet Take 1 tablet (5 mg total) by mouth 3 (three) times daily as needed for muscle spasms. 30 tablet 0  . gabapentin (NEURONTIN) 100 MG capsule Take 1 capsule (100 mg total) by mouth 3 (three) times daily. 90 capsule 0  . HYDROcodone-acetaminophen (NORCO) 10-325 MG tablet Take 0.5-1 tablets by mouth every 12 (twelve) hours as needed for severe pain. 14 tablet 0  . hydrocortisone (ANUSOL-HC) 2.5 % rectal cream Place rectally 4 (four) times daily. (Patient taking differently: Place 1 application rectally as needed for hemorrhoids. ) 30 g 0  . insulin aspart (NOVOLOG) 100 UNIT/ML injection Inject 4-6 Units into the skin 3 (three) times daily before meals. Per sliding scale . If CBG>150 give 4 units, if CBG >200 give 6 units.    [name] LEVEMIR 100 UNIT/ML injection Inject 0.1-0.3 mLs (10-30 Units total) into the skin 2 (two) times daily. 10 units every morning, and 34 units every evening 10 mL 2  . lidocaine (XYLOCAINE) 2 % jelly Place into the urethra as needed (Use with in and out catheter). 30 mL 0  . lisinopril (PRINIVIL,ZESTRIL) 20 MG tablet Take 1 tablet (20 mg total) by mouth daily. 30 tablet 0  . polyethylene glycol (MIRALAX / GLYCOLAX) packet Take 17 g by mouth 3 (three) times daily with meals. 90 each 0  . pravastatin (PRAVACHOL) 10 MG tablet Take 1 tablet (10 mg total) by mouth every evening. 30 tablet 0  . tamsulosin (FLOMAX) 0.4 MG CAPS capsule Take 1 capsule (0.4 mg total) by mouth 2 (two) times daily. 60 capsule 0  . traMADol (ULTRAM) 50 MG tablet Take 1 tablet (50 mg total) by mouth every 6 (six) hours as needed for moderate pain. 60 tablet 0    Home: Home  Living Family/patient expects to be discharged to:: Private residence Living Arrangements: Spouse/significant other Available Help at Discharge: Family Type of Home: House Home Access: Stairs to enter Technical brewer of Steps: 2 Entrance Stairs-Rails: Right, Left Home Layout: One level Bathroom Shower/Tub: Tub/shower unit, Architectural technologist: Handicapped height Home Equipment: Environmental consultant - 2 wheels, [employer] - single point, Civil engineer, contracting, Grab bars - tub/shower, Grab bars - toilet  Lives With: Spouse  Functional History: Prior Function Level of Independence: Independent with assistive device(s) Comments: has used the RW post CIR and was performing self care Functional Status:  Mobility: Bed Mobility Overal bed mobility: Needs Assistance Bed Mobility: Rolling, Sidelying to Sit Rolling: Min assist Sidelying to sit: Mod assist General bed mobility comments: received in chair Transfers Overall transfer level: Needs assistance Equipment used: Rolling walker (2 wheeled) Transfers: Sit to/from Stand Sit to Stand: Min assist General transfer comment: assist to steady, increased time, heavy reliance on UEs Ambulation/Gait Ambulation/Gait assistance: Min assist  Ambulation Distance (Feet): 22 Feet Assistive device: Rolling walker (2 wheeled) Gait Pattern/deviations: Step-through pattern, Decreased step length - right, Decreased step length - left, Decreased stride length General Gait Details: slow, effortful, steps, left noticeably weak.  Generally unsteady with lots of wobble due to little balance correct possible at ankles Gait velocity interpretation: Below normal speed for age/gender    ADL: ADL Overall ADL's : Needs assistance/impaired Eating/Feeding: Independent, Sitting Grooming: Wash/dry hands, Wash/dry face, Sitting, Set up Upper Body Bathing: Sitting, Minimal assistance Lower Body Bathing: Total assistance, Sit to/from stand Upper Body Dressing : Sitting, Set up Lower  Body Dressing: Total assistance, Sit to/from stand Toilet Transfer: Minimal assistance, Ambulation, RW, BSC Toileting- Clothing Manipulation and Hygiene: Sit to/from stand, Total assistance Functional mobility during ADLs: Minimal assistance, Rolling walker General ADL Comments: pt unable to release walker in standing to pull up pants  Cognition: Cognition Overall Cognitive Status: Within Functional Limits for tasks assessed Orientation Level: Oriented X4 Cognition Arousal/Alertness: Awake/alert Behavior During Therapy: WFL for tasks assessed/performed Overall Cognitive Status: Within Functional Limits for tasks assessed   Blood pressure 111/61, pulse 89, temperature 98.6 F (37 C), temperature source Oral, resp. rate 18, height 5\' 11"  (1.803 m), weight 94.3 kg (208 lb), SpO2 94 %. Physical Exam  Nursing note and vitals reviewed. Constitutional: He is oriented to person, place, and time. He appears well-developed and well-nourished.  Obese male. Nauseous with intermittent heaves. + hiccups  HENT:  Head: Normocephalic and atraumatic.  Eyes: Pupils are equal, round, and reactive to light. Conjunctivae are normal.  Neck: Normal range of motion. Neck supple.  Cardiovascular: Normal rate and regular rhythm.   Respiratory: Effort normal and breath sounds normal. No stridor. No respiratory distress. He has no wheezes.  GI: He exhibits distension. Bowel sounds are decreased. There is tenderness (lower abdominal to palpation).  Musculoskeletal: He exhibits edema.  Neurological: He is alert and oriented to person, place, and time.  Skin: Skin is warm and dry. No rash noted. He is not diaphoretic. No erythema.  Motor strength is 5/5 bilateral deltoid, biceps, triceps, grip 2 minus bilateral hip flexors 3 minus, knee extensors 3 minus bilateral ankle dorsiflexors.  Results for orders placed or performed during the hospital encounter of 02/22/17 (from the past 24 hour(s))  Glucose, capillary      Status: Abnormal   Collection Time: 02/24/17 12:07 PM  Result Value Ref Range   Glucose-Capillary 171 (H) 65 - 99 mg/dL  Glucose, capillary     Status: Abnormal   Collection Time: 02/24/17  4:49 PM  Result Value Ref Range   Glucose-Capillary 156 (H) 65 - 99 mg/dL  Glucose, capillary     Status: Abnormal   Collection Time: 02/24/17  9:03 PM  Result Value Ref Range   Glucose-Capillary 177 (H) 65 - 99 mg/dL  CBC with Differential/Platelet     Status: Abnormal   Collection Time: 02/25/17  4:10 AM  Result Value Ref Range   WBC 17.7 (H) 4.0 - 10.5 K/uL   RBC 3.25 (L) 4.22 - 5.81 MIL/uL   Hemoglobin 9.5 (L) 13.0 - 17.0 g/dL   HCT 28.3 (L) 39.0 - 52.0 %   MCV 87.1 78.0 - 100.0 fL   MCH 29.2 26.0 - 34.0 pg   MCHC 33.6 30.0 - 36.0 g/dL   RDW 13.2 11.5 - 15.5 %   Platelets 276 150 - 400 K/uL   Neutrophils Relative % 87 %   Neutro Abs 15.4 (H) 1.7 - 7.7 K/uL  Lymphocytes Relative 5 %   Lymphs Abs 0.9 0.7 - 4.0 K/uL   Monocytes Relative 8 %   Monocytes Absolute 1.4 (H) 0.1 - 1.0 K/uL   Eosinophils Relative 0 %   Eosinophils Absolute 0.1 0.0 - 0.7 K/uL   Basophils Relative 0 %   Basophils Absolute 0.0 0.0 - 0.1 K/uL  Basic metabolic panel     Status: Abnormal   Collection Time: 02/25/17  4:10 AM  Result Value Ref Range   Sodium 135 135 - 145 mmol/L   Potassium 3.4 (L) 3.5 - 5.1 mmol/L   Chloride 102 101 - 111 mmol/L   CO2 24 22 - 32 mmol/L   Glucose, Bld 143 (H) 65 - 99 mg/dL   BUN 39 (H) 6 - 20 mg/dL   Creatinine, Ser 2.03 (H) 0.61 - 1.24 mg/dL   Calcium 8.1 (L) 8.9 - 10.3 mg/dL   GFR calc non Af Amer 30 (L) >60 mL/min   GFR calc Af Amer 35 (L) >60 mL/min   Anion gap 9 5 - 15  Glucose, capillary     Status: Abnormal   Collection Time: 02/25/17  7:47 AM  Result Value Ref Range   Glucose-Capillary 144 (H) 65 - 99 mg/dL   No results found.  Assessment/Plan: Diagnosis: Paraparesis, neurogenic bowel, bladder, cauda equina syndrome, epidural hematoma L2 level 1. Does the need  for close, 24 hr/day medical supervision in concert with the patient's rehab needs make it unreasonable for this patient to be served in a less intensive setting? Yes and Potentially 2. Co-Morbidities requiring supervision/potential complications: Bacteremia, type 2 diabetes 3. Due to bladder management, bowel management, safety, skin/wound care, disease management, medication administration, pain management and patient education, does the patient require 24 hr/day rehab nursing? Potentially 4. Does the patient require coordinated care of a physician, rehab nurse, PT, OT to address physical and functional deficits in the context of the above medical diagnosis(es)? Yes Addressing deficits in the following areas: balance, endurance, locomotion, strength, transferring, bowel/bladder control, bathing, dressing, feeding, grooming, toileting and psychosocial support 5. Can the patient actively participate in an intensive therapy program of at least 3 hrs of therapy per day at least 5 days per week? Yes 6. The potential for patient to make measurable gains while on inpatient rehab is Limited, he is likely to progress over the next couple months rather than the next couple weeks 7. Anticipated functional outcomes upon discharge from inpatient rehab are supervision and min assist  with PT, modified independent and supervision with OT, n/a with SLP. 8. Estimated rehab length of stay to reach the above functional goals is: 6-8wks 9. Anticipated D/C setting: Home 10. Anticipated post D/C treatments: [place] therapy 11. Overall Rehab/Functional Prognosis: fair  RECOMMENDATIONS: This patient's condition is appropriate for continued rehabilitative care in the following setting: SNF Patient has agreed to participate in recommended program. Patient does not wish to go to SNF, but he understands the more prolonged time frame of recovery that is expected,  Note that insurance prior authorization may be required for  reimbursement for recommended care.  Comment: I discussed with the patient and his son that his time course for recovery may be more prolonged. Another rehabilitation stay of a couple weeks. Will not result in modified independent levels. The son states that family can perhaps arrange for 24 hour supervision at home. He also has questions about a suprapubic catheter placement, rather than in and out catheters.  Charlett Blake M.D. [facility] FAAPM&R (Sports Med, Neuromuscular Med) Diplomate Am Board of Electrodiagnostic Med  Flora Lipps 02/25/2017

## 2017-02-26 ENCOUNTER — Inpatient Hospital Stay (HOSPITAL_COMMUNITY): Payer: Medicare HMO

## 2017-02-26 DIAGNOSIS — R339 Retention of urine, unspecified: Secondary | ICD-10-CM

## 2017-02-26 LAB — GLUCOSE, CAPILLARY
GLUCOSE-CAPILLARY: 135 mg/dL — AB (ref 65–99)
GLUCOSE-CAPILLARY: 134 mg/dL — AB (ref 65–99)
Glucose-Capillary: 131 mg/dL — ABNORMAL HIGH (ref 65–99)
Glucose-Capillary: 138 mg/dL — ABNORMAL HIGH (ref 65–99)

## 2017-02-26 LAB — CULTURE, BLOOD (ROUTINE X 2)

## 2017-02-26 MED ORDER — TRAZODONE HCL 50 MG PO TABS
50.0000 mg | ORAL_TABLET | Freq: Once | ORAL | Status: AC
  Administered 2017-02-26: 50 mg via ORAL
  Filled 2017-02-26: qty 1

## 2017-02-26 NOTE — Progress Notes (Signed)
Per patient, he would prefer to return home than SNF.  CSW signing off.  Percell Locus Victora Irby LCSWA [phone]

## 2017-02-26 NOTE — Progress Notes (Signed)
PROGRESS NOTE    Donald Calderon  PXT:[id number] DOB: 1940/09/09 DOA: 02/22/2017 PCP: [name], NP    Brief Narrative:  76 year old male who presented with fever. Patient does have a significant past medical history for hypertension, type 2 diabetes mellitus, and chronic back pain. Recent hospitalization August 2018 for L3, L4 exploration, lumbar decompression O2-V0, complicated by L2 hematoma. Patient was successfully surgically intervened, and discharged to rehabilitation. Over last 2 weeks he has been at home, having difficulty ambulating, frequent falls. He has been self catheterizing due to bladder dysfunction and urinary retention. The day of admission he was noted to be febrile, confused and having lower abdominal pain. On the initial physical examination, blood pressure 120/67, heart rate 104, respirations 16, temperature 101.3, oxygen saturation 92%. Dry mucous membranes, lungs were clear to auscultation bilaterally, heart S1-S2 present tachycardic, no gallops, rubs or murmurs, abdomen was soft, tender to palpation lower quadrants, no mass palpated, no lower extremity edema. His urinalysis had too numerous count white cells. Sodium 131, potassium 3.9, chloride 97, bicarbonate 23, glucose 1:30, BUN 41, creatinine 2.71, white count 16.2, hemoglobin 9.2, hematocrit 28.1, platelets 206, venous lactic acid 1.7. His chest x-ray was negative for infiltrates. Renal ultrasound with no hydronephrosis.  Patient was admitted to the hospital working diagnosis of sepsis due to urinary tract infection, present on admission.   Assessment & Plan:   Principal Problem:   Sepsis secondary to UTI [facility]) Active Problems:   Chronic back pain   Diabetes mellitus type 2 in obese (HCC)   Benign essential HTN   Normocytic anemia   Neurogenic bladder   AKI (acute kidney injury) ([place])   1. Sepsis due to urinary tract infection, Escherichia coli, present on admission with metabolic/ septic  encephalopathy. Tolerating well antibiotic therapy with cefazolin IV, will continue IV fluids with isotonic solutions.  Follow up renal US with no collection or obstructive uropathy.   2. Acute kidney injury. Continue hydration with IV fluids to 75 cc. Will follow on renal panel in am, avoid nephrotoxic medications, avoid hypotension.  3. New onset of ileus.  Clinically abdomen more soft and less distended, positive flatus, no bowel movement. No nausea or vomiting. Unable to place NG tube. Will follow on abdominal films, if improving will advance diet with clear liquids. Continue as needed antiemetics and IV fluids.   3. Hypertension. On amlodipine 10 mg daily, for blood pressure control, systolic blood pressure 350 to 140.    4. Type 2 diabetes mellitus. Capillary glucose 160, 136, 135, 131. Continue with insulin sliding scale for glucose cover and monitoring, continue npo for now due to ileus.   5. Chronic back pain with ambulatoryfunction. pain controlled with acetaminophen, hydrocodone and cyclobenzaprine.   6. BPH and bladder dysfunction. Continue foley catheter, due to urinary retention.    DVT prophylaxis:heparin Code Status:full Family Communication:I spoke with patient's daughter at the bedside and all questions were addressed Disposition Plan:Inpatient reahb   Consultants:    Procedures:    Antimicrobials:   Cefazolin   Subjective: Unable to place NG tube yesterday, patient with improved abdominal distention today, positive flatus, no nausea or vomiting, no chest pain. Positive weakness.   Objective: Vitals:   02/25/17 0850 02/25/17 1416 02/25/17 2107 02/26/17 0507  BP: 111/61 110/74 140/71 (!) 141/68  Pulse:  92 83 92  Resp:  16 18 18   Temp:  98.9 F (37.2 C) 99.8 F (37.7 C) 98.4 F (36.9 C)  TempSrc:  Oral Oral Oral  SpO2:  95% 96% 97%  Weight:      Height:        Intake/Output Summary (Last 24 hours) at 02/26/17 1221 Last data  filed at 02/26/17 0656  Gross per 24 hour  Intake           2687.5 ml  Output             1600 ml  Net           1087.5 ml   Filed Weights   02/22/17 1702  Weight: 94.3 kg (208 lb)    Examination:  General: deconditioned Neurology: Awake and alert, non focal  E ENT: mild pallor, no icterus, oral mucosa moist Cardiovascular: No JVD. S1-S2 present, rhythmic, no gallops, rubs, or murmurs. No jugular venous distention, no lower extremity edema. Pulmonary: vesicular breath sounds bilaterally, adequate air movement, no wheezing, rhonchi or rales. Gastrointestinal. Abdomen distended and tympanic, no organomegaly, non tender, no rebound or guarding. Positive bowel sounds.  Skin. No rashes Musculoskeletal: no joint deformities     Data Reviewed: I have personally reviewed following labs and imaging studies  CBC:  Recent Labs Lab 02/22/17 1652 02/22/17 1751 02/23/17 0658 02/25/17 0410  WBC 16.2*  --  15.7* 17.7*  NEUTROABS 14.4*  --   --  15.4*  HGB 9.2* 9.2* 8.7* 9.5*  HCT 28.1* 27.0* 26.6* 28.3*  MCV 87.8  --  87.8 87.1  PLT 206  --  211 528   Basic Metabolic Panel:  Recent Labs Lab 02/22/17 1652 02/22/17 1751 02/23/17 0658 02/25/17 0410  NA 131* 132* 133* 135  K 3.9 3.9 3.7 3.4*  CL 97* 95* 101 102  CO2 23  --  22 24  GLUCOSE 130* 136* 115* 143*  BUN 41* 38* 39* 39*  CREATININE 2.71* 2.70* 2.48* 2.03*  CALCIUM 8.5*  --  8.0* 8.1*   GFR: Estimated Creatinine Clearance: 36.9 mL/min (A) (by C-G formula based on SCr of 2.03 mg/dL (H)). Liver Function Tests:  Recent Labs Lab 02/22/17 1652  AST 22  ALT 19  ALKPHOS 78  BILITOT 1.0  PROT 5.8*  ALBUMIN 2.5*   No results for input(s): LIPASE, AMYLASE in the last 168 hours. No results for input(s): AMMONIA in the last 168 hours. Coagulation Profile: No results for input(s): INR, PROTIME in the last 168 hours. Cardiac Enzymes: No results for input(s): CKTOTAL, CKMB, CKMBINDEX, TROPONINI in the last 168  hours. BNP (last 3 results) No results for input(s): PROBNP in the last 8760 hours. HbA1C: No results for input(s): HGBA1C in the last 72 hours. CBG:  Recent Labs Lab 02/25/17 0747 02/25/17 1156 02/25/17 1701 02/25/17 2105 02/26/17 0731  GLUCAP 144* 132* 160* 136* 135*   Lipid Profile: No results for input(s): CHOL, HDL, LDLCALC, TRIG, CHOLHDL, LDLDIRECT in the last 72 hours. Thyroid Function Tests: No results for input(s): TSH, T4TOTAL, FREET4, T3FREE, THYROIDAB in the last 72 hours. Anemia Panel: No results for input(s): VITAMINB12, FOLATE, FERRITIN, TIBC, IRON, RETICCTPCT in the last 72 hours.    Radiology Studies: I have reviewed all of the imaging during this hospital visit personally     Scheduled Meds: . amLODipine  10 mg Oral Daily  . gabapentin  100 mg Oral TID  . heparin  5,000 Units Subcutaneous Q8H  . insulin aspart  0-5 Units Subcutaneous QHS  . insulin aspart  0-9 Units Subcutaneous TID WC  . mouth rinse  15 mL Mouth Rinse BID  . pantoprazole  40 mg  Oral Daily  . polyethylene glycol  17 g Oral Daily  . pravastatin  10 mg Oral QPM  . promethazine  12.5 mg Intravenous Once  . tamsulosin  0.4 mg Oral BID   Continuous Infusions: . sodium chloride 75 mL/hr at 02/25/17 2236  .  ceFAZolin (ANCEF) IV Stopped (02/26/17 1552)     LOS: 4 days        Mauricio Gerome Apley, MD [facility] Pager [phone]

## 2017-02-26 NOTE — Progress Notes (Signed)
Physical Therapy Treatment Patient Details Name: Donald Calderon MRN: [id number] DOB: 04-04-1941 Today's Date: 02/26/2017    History of Present Illness pt is a 76 y/o male with pmh significant for HTN, DM2, alcohol abuse, chronic back pain, (recent admission to CIR after lumbar exploration and decompresion at L3/4 and L 2/3, respectively with complication of L2 hematoma necessitating return to OR), now presenting with fever, generalized malaise and c/o lower abdominal pain referred to his back.  Associated symptoms including cloudy urine, N/V and decreased appetitie.  Workin dx incl sepsis due to UTI.    PT Comments    Pt making slow progress, complicated now by new ileus.  Emphasis on sit to stand, gait and safer technique to get into bed.   Follow Up Recommendations  SNF     Equipment Recommendations  None recommended by PT    Recommendations for Other Services       Precautions / Restrictions Precautions Precautions: Fall Required Braces or Orthoses: Spinal Brace Spinal Brace: Lumbar corset;Applied in sitting position    Mobility  Bed Mobility Overal bed mobility: Needs Assistance Bed Mobility: Sit to Sidelying         Sit to sidelying: Mod assist General bed mobility comments: cues for going down safely to sidelying and assist controlling trunk and legs  Transfers Overall transfer level: Needs assistance Equipment used: Rolling walker (2 wheeled) Transfers: Sit to/from Stand Sit to Stand: Min assist         General transfer comment: assist to come forward the lower the surface  Ambulation/Gait Ambulation/Gait assistance: Min assist Ambulation Distance (Feet): 15 Feet (x2) Assistive device: Rolling walker (2 wheeled) Gait Pattern/deviations: Step-through pattern;Shuffle;Decreased step length - right;Decreased step length - left     General Gait Details: slow, shuffled steps, left weaker than right LE.  Overall unsteady with lots of UE use   Stairs            Wheelchair Mobility    Modified Rankin (Stroke Patients Only)       Balance Overall balance assessment: Needs assistance Sitting-balance support: No upper extremity supported Sitting balance-Leahy Scale: Fair       Standing balance-Leahy Scale: Poor Standing balance comment: heavy reliance on the RW                            Cognition Arousal/Alertness: Awake/alert Behavior During Therapy: WFL for tasks assessed/performed Overall Cognitive Status: Within Functional Limits for tasks assessed                                        Exercises      General Comments        Pertinent Vitals/Pain Pain Assessment: Faces Faces Pain Scale: Hurts little more Pain Location: generalized, back Pain Descriptors / Indicators: Guarding;Grimacing;Discomfort Pain Intervention(s): Monitored during session    Home Living                      Prior Function            PT Goals (current goals can now be found in the care plan section) Acute Rehab PT Goals Patient Stated Goal: walk better PT Goal Formulation: With patient Time For Goal Achievement: 03/10/17 Potential to Achieve Goals: Good Progress towards PT goals: Progressing toward goals    Frequency    Min 3X/week  PT Plan Current plan remains appropriate    Co-evaluation              AM-PAC PT "6 Clicks" Daily Activity  Outcome Measure  Difficulty turning over in bed (including adjusting bedclothes, sheets and blankets)?: Unable Difficulty moving from lying on back to sitting on the side of the bed? : Unable Difficulty sitting down on and standing up from a chair with arms (e.g., wheelchair, bedside commode, etc,.)?: A Lot Help needed moving to and from a bed to chair (including a wheelchair)?: A Lot Help needed walking in hospital room?: A Little Help needed climbing 3-5 steps with a railing? : A Lot 6 Click Score: 11    End of Session   Activity  Tolerance: Patient tolerated treatment well;Patient limited by fatigue;Patient limited by pain Patient left: in bed;with bed alarm set;with call bell/phone within reach Nurse Communication: Mobility status PT Visit Diagnosis: Unsteadiness on feet (R26.81);Other abnormalities of gait and mobility (R26.89);Pain Pain - part of body:  (back)     Time: 6384-5364 PT Time Calculation (min) (ACUTE ONLY): 28 min  Charges:  $Gait Training: 8-22 mins $Therapeutic Activity: 8-22 mins                    G Codes:       03-10-17  Donald Calderon, PT [phone]  (pager)   Donald Calderon 03/10/17, 6:19 PM

## 2017-02-27 ENCOUNTER — Inpatient Hospital Stay (HOSPITAL_COMMUNITY): Payer: Medicare HMO

## 2017-02-27 LAB — CBC WITH DIFFERENTIAL/PLATELET
BASOS PCT: 0 %
Basophils Absolute: 0 10*3/uL (ref 0.0–0.1)
Eosinophils Absolute: 0.2 10*3/uL (ref 0.0–0.7)
Eosinophils Relative: 1 %
HEMATOCRIT: 27.3 % — AB (ref 39.0–52.0)
HEMOGLOBIN: 8.9 g/dL — AB (ref 13.0–17.0)
LYMPHS ABS: 1.1 10*3/uL (ref 0.7–4.0)
Lymphocytes Relative: 8 %
MCH: 28.5 pg (ref 26.0–34.0)
MCHC: 32.6 g/dL (ref 30.0–36.0)
MCV: 87.5 fL (ref 78.0–100.0)
MONOS PCT: 10 %
Monocytes Absolute: 1.3 10*3/uL — ABNORMAL HIGH (ref 0.1–1.0)
NEUTROS ABS: 11.3 10*3/uL — AB (ref 1.7–7.7)
NEUTROS PCT: 81 %
Platelets: 300 10*3/uL (ref 150–400)
RBC: 3.12 MIL/uL — AB (ref 4.22–5.81)
RDW: 13.3 % (ref 11.5–15.5)
WBC: 13.9 10*3/uL — AB (ref 4.0–10.5)

## 2017-02-27 LAB — BASIC METABOLIC PANEL
ANION GAP: 9 (ref 5–15)
BUN: 25 mg/dL — ABNORMAL HIGH (ref 6–20)
CALCIUM: 8 mg/dL — AB (ref 8.9–10.3)
CHLORIDE: 107 mmol/L (ref 101–111)
CO2: 22 mmol/L (ref 22–32)
Creatinine, Ser: 1.65 mg/dL — ABNORMAL HIGH (ref 0.61–1.24)
GFR calc non Af Amer: 39 mL/min — ABNORMAL LOW (ref >60)
GFR, EST AFRICAN AMERICAN: 45 mL/min — AB (ref >60)
Glucose, Bld: 138 mg/dL — ABNORMAL HIGH (ref 65–99)
Potassium: 3.4 mmol/L — ABNORMAL LOW (ref 3.5–5.1)
Sodium: 138 mmol/L (ref 135–145)

## 2017-02-27 LAB — GLUCOSE, CAPILLARY
GLUCOSE-CAPILLARY: 115 mg/dL — AB (ref 65–99)
GLUCOSE-CAPILLARY: 189 mg/dL — AB (ref 65–99)
Glucose-Capillary: 146 mg/dL — ABNORMAL HIGH (ref 65–99)
Glucose-Capillary: 179 mg/dL — ABNORMAL HIGH (ref 65–99)

## 2017-02-27 MED ORDER — POLYETHYLENE GLYCOL 3350 17 G PO PACK
17.0000 g | PACK | Freq: Two times a day (BID) | ORAL | Status: DC
  Administered 2017-02-27 – 2017-03-01 (×5): 17 g via ORAL
  Filled 2017-02-27 (×5): qty 1

## 2017-02-27 MED ORDER — POLYETHYLENE GLYCOL 3350 17 G PO PACK: 17.0000 g | PACK | Freq: Every day | ORAL | Status: DC

## 2017-02-27 NOTE — Progress Notes (Signed)
PROGRESS NOTE    Donald Calderon  PNT:[id number] DOB: May 24, 1941 DOA: 02/22/2017 PCP: [name], NP    Brief Narrative:  76 year old male who presented with fever. Patient does have a significant past medical history for hypertension, type 2 diabetes mellitus, and chronic back pain. Recent hospitalization August 2018 for L3, L4 exploration, lumbar decompression G8-Q7, complicated by L2 hematoma. Patient was successfully surgically intervened, and discharged to rehabilitation. Over last 2 weeks he has been at home, having difficulty ambulating, frequent falls. He has been self catheterizing due to bladder dysfunction and urinary retention. The day of admission he was noted to be febrile, confused and having lower abdominal pain. On the initial physical examination, blood pressure 120/67, heart rate 104, respirations 16, temperature 101.3, oxygen saturation 92%. Dry mucous membranes, lungs were clear to auscultation bilaterally, heart S1-S2 present tachycardic, no gallops, rubs or murmurs, abdomen was soft, tender to palpation lower quadrants, no mass palpated, no lower extremity edema. His urinalysis had too numerous count white cells. Sodium 131, potassium 3.9, chloride 97, bicarbonate 23, glucose 1:30, BUN 41, creatinine 2.71, white count 16.2, hemoglobin 9.2, hematocrit 28.1, platelets 206, venous lactic acid 1.7. His chest x-ray was negative for infiltrates. Renal ultrasound with no hydronephrosis.  Patient was admitted to the hospital working diagnosis of sepsis due to urinary tract infection, present on admission. Patient responding well to antibiotic therapy. Developed ileus.    Assessment & Plan:   Principal Problem:   Sepsis secondary to UTI [facility]) Active Problems:   Chronic back pain   Diabetes mellitus type 2 in obese (HCC)   Benign essential HTN   Normocytic anemia   Neurogenic bladder   AKI (acute kidney injury) ([place])  1. Sepsis due to urinary tract infection,  Escherichia coli, present on admission with metabolic/ septic encephalopathy. Continue with cefazolinIV, #4 of effective antibiotic therapy, gentle IV fluids with isotonic solutions. Patient has remained afebrile, wbc trending down to 13 from 17.   2. Acute kidney injury. Renal function with serum cr down to 1,6 from 2,0, will continue gentle hydration with saline at 75 per hour, will follow on renal panel in am, avoid hypotension or nephrotoxic medications.   3. New onset of ileus.  Clinically abdomen continue to improve, positive flatus, no nausea or vomiting, abdominal film personally reviewed, noted no air fluid levels, will resume diet with cleat liquids and will increase miralax to bid, out of bed as tolerated, ambulation as tolerated.  3. Hypertension. Continue with amlodipine 10 mg daily, blood pressure controlled, with systolic 619 to 509.    4. Type 2 diabetes mellitus. Capillary glucose 131, 138, 134, 146, 115. Oninsulin sliding scale for glucose cover and monitoring, will resume clear liquid diet as tolerated.   5. Chronic back pain with ambulatoryfunction.pain continue to be controlled, on acetaminophen, hydrocodone and cyclobenzaprine. Modify bowel regimen with miralax bid.   6. BPH and bladder dysfunction. Plan to keep foley catheter and check voiding trial as outpatient.   DVT prophylaxis:heparin Code Status:full Family Communication:I spoke with patient's daughter at the bedside and all questions were addressed Disposition Plan:Inpatient reahb   Consultants:    Procedures:    Antimicrobials:   Cefazolin   Subjective: Patient feeling better, abdomen with less distention and passing gas, no nausea or vomiting, no chest pain or dyspnea, positive weakness.   Objective: Vitals:   02/25/17 2107 02/26/17 0507 02/26/17 2206 02/27/17 0415  BP: 140/71 (!) 141/68 (!) 158/69 (!) 148/71  Pulse: 83 92 95 94  Resp: 18 18 17 18   Temp: 99.8 F (37.7  C) 98.4 F (36.9 C) 99.4 F (37.4 C) 98.9 F (37.2 C)  TempSrc: Oral Oral Oral Oral  SpO2: 96% 97% 96% 96%  Weight:      Height:        Intake/Output Summary (Last 24 hours) at 02/27/17 1038 Last data filed at 02/27/17 0951  Gross per 24 hour  Intake              655 ml  Output             4500 ml  Net            -3845 ml   Filed Weights   02/22/17 1702  Weight: 94.3 kg (208 lb)    Examination:  General: deconditioned  Neurology: Awake and alert, non focal  E ENT: no pallor, no icterus, oral mucosa moist Cardiovascular: No JVD. S1-S2 present, rhythmic, no gallops, rubs, or murmurs. No jugular venous distention, trace lower extremity edema. Pulmonary: vesicular breath sounds bilaterally, adequate air movement, no wheezing, rhonchi or rales. Gastrointestinal. Abdomen mild distention, bowel sounds positive, no organomegaly, non tender, no rebound or guarding Skin. No rashes Musculoskeletal: no joint deformities     Data Reviewed: I have personally reviewed following labs and imaging studies  CBC:  Recent Labs Lab 02/22/17 1652 02/22/17 1751 02/23/17 0658 02/25/17 0410 02/27/17 0457  WBC 16.2*  --  15.7* 17.7* 13.9*  NEUTROABS 14.4*  --   --  15.4* 11.3*  HGB 9.2* 9.2* 8.7* 9.5* 8.9*  HCT 28.1* 27.0* 26.6* 28.3* 27.3*  MCV 87.8  --  87.8 87.1 87.5  PLT 206  --  211 276 161   Basic Metabolic Panel:  Recent Labs Lab 02/22/17 1652 02/22/17 1751 02/23/17 0658 02/25/17 0410 02/27/17 0457  NA 131* 132* 133* 135 138  K 3.9 3.9 3.7 3.4* 3.4*  CL 97* 95* 101 102 107  CO2 23  --  22 24 22   GLUCOSE 130* 136* 115* 143* 138*  BUN 41* 38* 39* 39* 25*  CREATININE 2.71* 2.70* 2.48* 2.03* 1.65*  CALCIUM 8.5*  --  8.0* 8.1* 8.0*   GFR: Estimated Creatinine Clearance: 45.4 mL/min (A) (by C-G formula based on SCr of 1.65 mg/dL (H)). Liver Function Tests:  Recent Labs Lab 02/22/17 1652  AST 22  ALT 19  ALKPHOS 78  BILITOT 1.0  PROT 5.8*  ALBUMIN 2.5*   No  results for input(s): LIPASE, AMYLASE in the last 168 hours. No results for input(s): AMMONIA in the last 168 hours. Coagulation Profile: No results for input(s): INR, PROTIME in the last 168 hours. Cardiac Enzymes: No results for input(s): CKTOTAL, CKMB, CKMBINDEX, TROPONINI in the last 168 hours. BNP (last 3 results) No results for input(s): PROBNP in the last 8760 hours. HbA1C: No results for input(s): HGBA1C in the last 72 hours. CBG:  Recent Labs Lab 02/26/17 0731 02/26/17 1252 02/26/17 1639 02/26/17 2205 02/27/17 0759  GLUCAP 135* 131* 138* 134* 146*   Lipid Profile: No results for input(s): CHOL, HDL, LDLCALC, TRIG, CHOLHDL, LDLDIRECT in the last 72 hours. Thyroid Function Tests: No results for input(s): TSH, T4TOTAL, FREET4, T3FREE, THYROIDAB in the last 72 hours. Anemia Panel: No results for input(s): VITAMINB12, FOLATE, FERRITIN, TIBC, IRON, RETICCTPCT in the last 72 hours.    Radiology Studies: I have reviewed all of the imaging during this hospital visit personally     Scheduled Meds: . amLODipine  10 mg Oral Daily  .  gabapentin  100 mg Oral TID  . heparin  5,000 Units Subcutaneous Q8H  . insulin aspart  0-5 Units Subcutaneous QHS  . insulin aspart  0-9 Units Subcutaneous TID WC  . mouth rinse  15 mL Mouth Rinse BID  . pantoprazole  40 mg Oral Daily  . polyethylene glycol  17 g Oral Daily  . pravastatin  10 mg Oral QPM  . promethazine  12.5 mg Intravenous Once  . tamsulosin  0.4 mg Oral BID   Continuous Infusions: . sodium chloride 75 mL/hr at 02/27/17 0135  .  ceFAZolin (ANCEF) IV 1 g (02/27/17 0842)     LOS: 5 days        Jericho Cieslik Gerome Apley, MD [facility] Pager [phone]

## 2017-02-27 NOTE — Progress Notes (Addendum)
error 

## 2017-02-28 ENCOUNTER — Inpatient Hospital Stay (HOSPITAL_COMMUNITY): Payer: Medicare HMO

## 2017-02-28 LAB — CULTURE, BLOOD (ROUTINE X 2)

## 2017-02-28 LAB — GLUCOSE, CAPILLARY
GLUCOSE-CAPILLARY: 141 mg/dL — AB (ref 65–99)
GLUCOSE-CAPILLARY: 176 mg/dL — AB (ref 65–99)
GLUCOSE-CAPILLARY: 202 mg/dL — AB (ref 65–99)
Glucose-Capillary: 181 mg/dL — ABNORMAL HIGH (ref 65–99)

## 2017-02-28 LAB — BASIC METABOLIC PANEL
ANION GAP: 9 (ref 5–15)
BUN: 16 mg/dL (ref 6–20)
CALCIUM: 7.9 mg/dL — AB (ref 8.9–10.3)
CO2: 23 mmol/L (ref 22–32)
Chloride: 104 mmol/L (ref 101–111)
Creatinine, Ser: 1.47 mg/dL — ABNORMAL HIGH (ref 0.61–1.24)
GFR, EST AFRICAN AMERICAN: 52 mL/min — AB (ref >60)
GFR, EST NON AFRICAN AMERICAN: 45 mL/min — AB (ref >60)
Glucose, Bld: 150 mg/dL — ABNORMAL HIGH (ref 65–99)
POTASSIUM: 3.1 mmol/L — AB (ref 3.5–5.1)
Sodium: 136 mmol/L (ref 135–145)

## 2017-02-28 MED ORDER — POTASSIUM CHLORIDE 20 MEQ PO PACK
40.0000 meq | PACK | Freq: Once | ORAL | Status: AC
  Administered 2017-02-28: 40 meq via ORAL
  Filled 2017-02-28: qty 2

## 2017-02-28 MED ORDER — DICLOFENAC SODIUM 1 % TD GEL
2.0000 g | Freq: Four times a day (QID) | TRANSDERMAL | Status: DC
  Administered 2017-02-28 – 2017-03-01 (×6): 2 g via TOPICAL
  Filled 2017-02-28: qty 100

## 2017-02-28 NOTE — Progress Notes (Signed)
PROGRESS NOTE    Donald Calderon  BJY:[id number] DOB: Oct 24, 1940 DOA: 02/22/2017 PCP: [name], NP    Brief Narrative:  76 year old male who presented with fever. Patient does have a significant past medical history for hypertension, type 2 diabetes mellitus, and chronic back pain. Recent hospitalization August 2018 for L3, L4 exploration, lumbar decompression Y8-M5, complicated by L2 hematoma. Patient was successfully surgically intervened, and discharged to rehabilitation. Over last 2 weeks he has been at home, having difficulty ambulating, frequent falls. He has been self catheterizing due to bladder dysfunction and urinary retention. The day of admission he was noted to be febrile, confused and having lower abdominal pain. On the initial physical examination, blood pressure 120/67, heart rate 104, respirations 16, temperature 101.3, oxygen saturation 92%. Dry mucous membranes, lungs were clear to auscultation bilaterally, heart S1-S2 present tachycardic, no gallops, rubs or murmurs, abdomen was soft, tender to palpation lower quadrants, no mass palpated, no lower extremity edema. His urinalysis had too numerous count white cells. Sodium 131, potassium 3.9, chloride 97, bicarbonate 23, glucose 1:30, BUN 41, creatinine 2.71, white count 16.2, hemoglobin 9.2, hematocrit 28.1, platelets 206, venous lactic acid 1.7. His chest x-ray was negative for infiltrates. Renal ultrasound with no hydronephrosis.  Patient was admitted to the hospital working diagnosis of sepsis due to urinary tract infection, present on admission. Patient responding well to antibiotic therapy. Developed ileus.    Assessment & Plan:   Principal Problem:   Sepsis secondary to UTI [facility]) Active Problems:   Chronic back pain   Diabetes mellitus type 2 in obese (HCC)   Benign essential HTN   Normocytic anemia   Neurogenic bladder   AKI (acute kidney injury) ([place])   1. Sepsis due to urinary tract infection,  Escherichia coli, present on admission with metabolic/ septic encephalopathy. On cefazolinIV, #5 of effective antibiotic therapy, continue IV fluids with isotonic solutions, at 50 ml per hour. Patient has remained afebrile. Will plan to keep foley catheter, and will need voiding trail as outpatient.   2. Acute kidney injury. Renal function with serum cr continue to trend down, will follow on renal panel in am, avoid hypotension or nephrotoxic medications.   3. Resolving ileus. Tolerated clear liquids, well, will advance to soft diet, continue bowel regimen with miralax bid. No nausea or vomiting, out of bed as tolerated.   3. Hypertension. On amlodipine 10 mg daily, blood pressure continue to be well controlled.   4. Type 2 diabetes mellitus. Capillary glucose 146, 115, 179, 189, 141. Continue with insulin sliding scale for glucose cover and monitoring.  5. Chronic back pain with ambulatoryfunction, complicated with NEW acute left knee pain.Patient on acetaminophen, hydrocodone and cyclobenzaprine. 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    6. BPH and bladder dysfunction.  With foley catheter.    DVT prophylaxis:heparin Code Status:full Family Communication:I spoke with patient's daughter at the bedside and all questions were addressed Disposition Plan:Inpatient reahb   Consultants:    Procedures:    Antimicrobials:   Cefazolin   Subjective: Patient with new knee pain on the left, felt popping while doing physical therapy, worse with movement, sharp in nature, severe in intensity, no radiation and associated with decrease range of motion. Positive flatus, no bowel movement, no nausea or vomiting, tolerating clears well.    Objective: Vitals:   02/27/17 0415 02/27/17 1544 02/27/17 2105 02/28/17 0438  BP: (!) 148/71 (!) 159/68 [name])  148/71 (!) 152/76  Pulse: 94 90 89 94  Resp: 18  16 18 17   Temp: 98.9 F (37.2 C) 99.3 F (37.4 C) 98.8 F (37.1 C) 99.4 F (37.4 C)  TempSrc: Oral Oral Oral Oral  SpO2: 96% 95% 96% 96%  Weight:      Height:        Intake/Output Summary (Last 24 hours) at 02/28/17 0839 Last data filed at 02/28/17 0650  Gross per 24 hour  Intake              290 ml  Output             4325 ml  Net            -4035 ml   Filed Weights   02/22/17 1702  Weight: 94.3 kg (208 lb)    Examination:  General: in pain Neurology: Awake and alert, non focal  E ENT: mild pallor, no icterus, oral mucosa moist Cardiovascular: No JVD. S1-S2 present, rhythmic, no gallops, rubs, or murmurs. No lower extremity edema. Left knee edema and tenderness to palpation, no increase local temperature or erythema.  Pulmonary: vesicular breath sounds bilaterally, adequate air movement, no wheezing, rhonchi or rales. Gastrointestinal. Abdomen flat, no organomegaly, non tender, no rebound or guarding Skin. No rashes Musculoskeletal: no joint deformities     Data Reviewed: I have personally reviewed following labs and imaging studies  CBC:  Recent Labs Lab 02/22/17 1652 02/22/17 1751 02/23/17 0658 02/25/17 0410 02/27/17 0457  WBC 16.2*  --  15.7* 17.7* 13.9*  NEUTROABS 14.4*  --   --  15.4* 11.3*  HGB 9.2* 9.2* 8.7* 9.5* 8.9*  HCT 28.1* 27.0* 26.6* 28.3* 27.3*  MCV 87.8  --  87.8 87.1 87.5  PLT 206  --  211 276 856   Basic Metabolic Panel:  Recent Labs Lab 02/22/17 1652 02/22/17 1751 02/23/17 0658 02/25/17 0410 02/27/17 0457 02/28/17 0531  NA 131* 132* 133* 135 138 136  K 3.9 3.9 3.7 3.4* 3.4* 3.1*  CL 97* 95* 101 102 107 104  CO2 23  --  22 24 22 23   GLUCOSE 130* 136* 115* 143* 138* 150*  BUN 41* 38* 39* 39* 25* 16  CREATININE 2.71* 2.70* 2.48* 2.03* 1.65* 1.47*  CALCIUM 8.5*  --  8.0* 8.1* 8.0* 7.9*   GFR: Estimated Creatinine Clearance: 50.9 mL/min (A) (by C-G formula based on SCr of 1.47 mg/dL (H)). Liver Function Tests:  Recent  Labs Lab 02/22/17 1652  AST 22  ALT 19  ALKPHOS 78  BILITOT 1.0  PROT 5.8*  ALBUMIN 2.5*   No results for input(s): LIPASE, AMYLASE in the last 168 hours. No results for input(s): AMMONIA in the last 168 hours. Coagulation Profile: No results for input(s): INR, PROTIME in the last 168 hours. Cardiac Enzymes: No results for input(s): CKTOTAL, CKMB, CKMBINDEX, TROPONINI in the last 168 hours. BNP (last 3 results) No results for input(s): PROBNP in the last 8760 hours. HbA1C: No results for input(s): HGBA1C in the last 72 hours. CBG:  Recent Labs Lab 02/27/17 0759 02/27/17 1156 02/27/17 1728 02/27/17 2101 02/28/17 0813  GLUCAP 146* 115* 179* 189* 141*   Lipid Profile: No results for input(s): CHOL, HDL, LDLCALC, TRIG, CHOLHDL, LDLDIRECT in the last 72 hours. Thyroid Function Tests: No results for input(s): TSH, T4TOTAL, FREET4, T3FREE, THYROIDAB in the last 72 hours. Anemia Panel: No results for input(s): VITAMINB12, FOLATE, FERRITIN, TIBC, IRON, RETICCTPCT in the last 72 hours.    Radiology  Studies: I have reviewed all of the imaging during this hospital visit personally     Scheduled Meds: . amLODipine  10 mg Oral Daily  . gabapentin  100 mg Oral TID  . heparin  5,000 Units Subcutaneous Q8H  . insulin aspart  0-5 Units Subcutaneous QHS  . insulin aspart  0-9 Units Subcutaneous TID WC  . mouth rinse  15 mL Mouth Rinse BID  . pantoprazole  40 mg Oral Daily  . polyethylene glycol  17 g Oral BID  . pravastatin  10 mg Oral QPM  . promethazine  12.5 mg Intravenous Once  . tamsulosin  0.4 mg Oral BID   Continuous Infusions: . sodium chloride 75 mL/hr at 02/28/17 0832  .  ceFAZolin (ANCEF) IV 1 g (02/28/17 1410)     LOS: 6 days        Tahmir Kleckner Gerome Apley, MD [facility] Pager [phone]

## 2017-03-01 LAB — BASIC METABOLIC PANEL
ANION GAP: 8 (ref 5–15)
BUN: 12 mg/dL (ref 6–20)
CALCIUM: 8.1 mg/dL — AB (ref 8.9–10.3)
CO2: 23 mmol/L (ref 22–32)
Chloride: 108 mmol/L (ref 101–111)
Creatinine, Ser: 1.46 mg/dL — ABNORMAL HIGH (ref 0.61–1.24)
GFR calc Af Amer: 52 mL/min — ABNORMAL LOW (ref >60)
GFR, EST NON AFRICAN AMERICAN: 45 mL/min — AB (ref >60)
GLUCOSE: 131 mg/dL — AB (ref 65–99)
Potassium: 3.2 mmol/L — ABNORMAL LOW (ref 3.5–5.1)
SODIUM: 139 mmol/L (ref 135–145)

## 2017-03-01 LAB — GLUCOSE, CAPILLARY
GLUCOSE-CAPILLARY: 140 mg/dL — AB (ref 65–99)
GLUCOSE-CAPILLARY: 138 mg/dL — AB (ref 65–99)
GLUCOSE-CAPILLARY: 186 mg/dL — AB (ref 65–99)

## 2017-03-01 MED ORDER — HYDROCODONE-ACETAMINOPHEN 10-325 MG PO TABS: 1.0000 | ORAL_TABLET | Freq: Two times a day (BID) | ORAL | 0 refills | Status: DC | PRN

## 2017-03-01 MED ORDER — BISACODYL 5 MG PO TBEC: 5.0000 mg | DELAYED_RELEASE_TABLET | Freq: Every day | ORAL | 0 refills | Status: AC | PRN

## 2017-03-01 MED ORDER — PANTOPRAZOLE SODIUM 40 MG PO TBEC: 40.0000 mg | DELAYED_RELEASE_TABLET | Freq: Every day | ORAL | 0 refills | Status: DC

## 2017-03-01 MED ORDER — CEPHALEXIN 500 MG PO CAPS: 500.0000 mg | ORAL_CAPSULE | Freq: Two times a day (BID) | ORAL | 0 refills | Status: AC

## 2017-03-01 MED ORDER — DICLOFENAC SODIUM 1 % TD GEL
2.0000 g | Freq: Four times a day (QID) | TRANSDERMAL | 0 refills | Status: DC
Start: 2017-03-01 — End: 2020-12-01

## 2017-03-01 MED ORDER — CEPHALEXIN 500 MG PO CAPS
500.0000 mg | ORAL_CAPSULE | Freq: Two times a day (BID) | ORAL | Status: DC
  Administered 2017-03-01: 500 mg via ORAL
  Filled 2017-03-01: qty 1

## 2017-03-01 NOTE — Clinical Social Work Placement (Signed)
Nurse to call report to [phone], Room [place]  NOTE  Date:  03/01/2017  Patient Details  Name: Donald Calderon MRN: [id number] Date of Birth: 02/12/1941  Clinical Social Work is seeking post-discharge placement for this patient at the [place] level of care (*CSW will initial, date and re-position this form in  chart as items are completed):  Yes   Patient/family provided with [facility] Work Department's list of facilities offering this level of care within the geographic area requested by the patient (or if unable, by the patient's family).  Yes   Patient/family informed of their freedom to choose among providers that offer the needed level of care, that participate in Medicare, Medicaid or managed care program needed by the patient, have an available bed and are willing to accept the patient.  Yes   Patient/family informed of [facility]'s ownership interest in [facility] and [facility], as well as of the fact that they are under no obligation to receive care at these facilities.  PASRR submitted to EDS on 03/01/17     PASRR number received on 03/01/17     Existing PASRR number confirmed on       FL2 transmitted to all facilities in geographic area requested by pt/family on       FL2 transmitted to all facilities within larger geographic area on 03/01/17     Patient informed that his/her managed care company has contracts with or will negotiate with certain facilities, including the following:        Yes   Patient/family informed of bed offers received.  Patient chooses bed at [facility]     Physician recommends and patient chooses bed at      Patient to be transferred to [facility] on 03/01/17.  Patient to be transferred to facility by [facility]     Patient family notified on 03/01/17 of transfer.  Name of family member notified:  [name]      PHYSICIAN       Additional Comment:    _______________________________________________ Geralynn Ochs, LCSW 03/01/2017, 2:58 PM

## 2017-03-01 NOTE — NC FL2 (Signed)
[place] LEVEL OF CARE SCREENING TOOL     IDENTIFICATION  Patient Name: Donald Calderon Birthdate: 06-28-1940 Sex: male Admission Date (Current Location): 02/22/2017  [facility] and [place] Number:  Herbalist and Address:  [facility] [place], [place] [address], [place], [place] [address]      Provider Number: [id number]  Attending Physician Name and Address:  Tawni Millers,*  Relative Name and Phone Number:  Izell [place], spouse, [phone]    Current Level of Care: Hospital Recommended Level of Care: [place] Prior Approval Number:    Date Approved/Denied:   PASRR Number: [id number]  Discharge Plan: SNF    Current Diagnoses: Patient Active Problem List   Diagnosis Date Noted  . Normocytic anemia 02/23/2017  . Neurogenic bladder 02/23/2017  . AKI (acute kidney injury) ([place]) 02/23/2017  . Sepsis secondary to UTI ([place]) 02/22/2017  . Postoperative ileus ([place]) 02/12/2017  . Urine retention 01/29/2017  . 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 Lumbar stenosis 01/26/2017  . Diabetes mellitus type 2 in obese ([place])   . Benign essential HTN   . ETOH abuse   . Chronic pain syndrome   . Hyponatremia   . Leukocytosis   . Acute blood loss anemia   . Epidural hematoma ([place]) 01/23/2017  . Status post lumbar surgery 01/21/2017  . Status post lumbar spine operation 01/21/2017  . Chronic back pain 09/27/2011  . NEUROPATHY 08/08/2010  . SWELLING MASS OR LUMP IN HEAD AND NECK 06/27/2010  . BENIGN PROSTATIC HYPERTROPHY, WITH URINARY OBSTRUCTION 06/19/2010  . 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  . HYPOKALEMIA 03/18/2010  . 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 Diabetes ([place]) 02/07/2010  . HYPERLIPIDEMIA 02/07/2010  . ALCOHOL ABUSE 02/07/2010  . HYPERTENSION 02/07/2010  . SPINAL STENOSIS, LUMBAR 02/07/2010    Orientation RESPIRATION BLADDER Height & Weight     Self, Time, Situation, Place  Normal Incontinent,  Indwelling catheter Weight: 208 lb (94.3 kg) Height:  5\' 11"  (180.3 cm)  BEHAVIORAL SYMPTOMS/MOOD NEUROLOGICAL BOWEL NUTRITION STATUS      Continent Diet (Please see DC Summary)  AMBULATORY STATUS COMMUNICATION OF NEEDS Skin   Extensive Assist Verbally Surgical wounds (Closed incision on back, 01/23/17)                       Personal Care Assistance Level of Assistance  Bathing, Dressing, Feeding Bathing Assistance: Maximum assistance Feeding assistance: Independent Dressing Assistance: Maximum assistance     Functional Limitations Info  Sight Sight Info: Impaired        SPECIAL CARE FACTORS FREQUENCY  PT (By licensed PT), OT (By licensed OT)     PT Frequency: 5x/week OT Frequency: 3x/week            Contractures      Additional Factors Info  Code Status, Allergies, Insulin Sliding Scale Code Status Info: Full Allergies Info: Penicillins   Insulin Sliding Scale Info: 3x daily with meals and at bedtime       Current Medications (03/01/2017):  This is the current hospital active medication list Current Facility-Administered Medications  Medication Dose Route Frequency Provider Last Rate Last Dose  . 0.9 %  sodium chloride infusion   Intravenous Continuous Tawni Millers, MD 75 mL/hr at 03/01/17 0019    . acetaminophen (TYLENOL) tablet 650 mg  650 mg Oral Q6H PRN Fuller Plan A, MD   650 mg at 02/28/17 1543  . albuterol (PROVENTIL) (2.5 MG/3ML) 0.083% nebulizer solution  2.5 mg  2.5 mg Nebulization Q2H PRN Fuller Plan A, MD   2.5 mg at 02/22/17 2114  . amLODipine (NORVASC) tablet 10 mg  10 mg Oral Daily Fuller Plan A, MD   10 mg at 03/01/17 0848  . cephALEXin (KEFLEX) capsule 500 mg  500 mg Oral Q12H Arrien, Jimmy Picket, MD   500 mg at 03/01/17 0848  . cyclobenzaprine (FLEXERIL) tablet 5 mg  5 mg Oral TID PRN Fuller Plan A, MD   5 mg at 02/25/17 0231  . diclofenac sodium (VOLTAREN) 1 % transdermal gel 2 g  2 g Topical QID Arrien, Jimmy Picket, MD   2 g at 03/01/17 0847  . gabapentin (NEURONTIN) capsule 100 mg  100 mg Oral TID Fuller Plan A, MD   100 mg at 03/01/17 0848  . heparin injection 5,000 Units  5,000 Units Subcutaneous Q8H Fuller Plan A, MD   5,000 Units at 03/01/17 0521  . HYDROcodone-acetaminophen (NORCO) 10-325 MG per tablet 0.5-1 tablet  0.5-1 tablet Oral Q12H PRN Fuller Plan A, MD   1 tablet at 02/28/17 0813  . hydrocortisone (ANUSOL-HC) 2.5 % rectal cream 1 application  1 application Rectal PRN Fuller Plan A, MD      . insulin aspart (novoLOG) injection 0-5 Units  0-5 Units Subcutaneous QHS Norval Morton, MD   2 Units at 02/28/17 2143  . insulin aspart (novoLOG) injection 0-9 Units  0-9 Units Subcutaneous TID WC Smith, Rondell A, MD   2 Units at 03/01/17 1238  . lidocaine (XYLOCAINE) 2 % viscous mouth solution 15 mL  15 mL Mouth/Throat Q4H PRN Arrien, Jimmy Picket, MD      . MEDLINE mouth rinse  15 mL Mouth Rinse BID Fuller Plan A, MD   15 mL at 03/01/17 0847  . ondansetron (ZOFRAN) tablet 4 mg  4 mg Oral Q6H PRN Fuller Plan A, MD       Or  . ondansetron (ZOFRAN) injection 4 mg  4 mg Intravenous Q6H PRN Fuller Plan A, MD   4 mg at 02/25/17 1420  . pantoprazole (PROTONIX) EC tablet 40 mg  40 mg Oral Daily Fuller Plan A, MD   40 mg at 03/01/17 0848  . polyethylene glycol (MIRALAX / GLYCOLAX) packet 17 g  17 g Oral BID Tawni Millers, MD   17 g at 03/01/17 0848  . pravastatin (PRAVACHOL) tablet 10 mg  10 mg Oral QPM Smith, Rondell A, MD   10 mg at 02/28/17 1833  . promethazine (PHENERGAN) injection 12.5 mg  12.5 mg Intravenous Once Schorr, Rhetta Mura, NP      . tamsulosin (FLOMAX) capsule 0.4 mg  0.4 mg Oral BID Fuller Plan A, MD   0.4 mg at 03/01/17 0849  . witch hazel-glycerin (TUCKS) pad   Topical PRN Debbe Odea, MD         Discharge Medications: Please see discharge summary for a list of discharge medications.  Relevant Imaging Results:  Relevant Lab  Results:   Additional Information SSN: [place]  [place], [place]

## 2017-03-01 NOTE — Progress Notes (Signed)
Pt called to the front desk wants his gown to be changed due to sweating, checked CBG 140, vital signs are stable, will continue to monitor pt

## 2017-03-01 NOTE — Clinical Social Work Note (Signed)
Clinical Social Work Assessment  Patient Details  Name: Donald Calderon MRN: [id number] Date of Birth: Aug 12, 1940  Date of referral:  03/01/17               Reason for consult:  Facility Placement                Permission sought to share information with:  Facility Sport and exercise psychologist, Family Supports Permission granted to share information::  Yes, Verbal Permission Granted  Name::     Building services engineer::  SNF  Relationship::  Daughter  Contact Information:     Housing/Transportation Living arrangements for the past 2 months:  [place] of Information:  Patient, Adult Children Patient Interpreter Needed:  None Criminal Activity/Legal Involvement Pertinent to Current Situation/Hospitalization:  No - Comment as needed Significant Relationships:  Adult Children Lives with:  Self Do you feel safe going back to the place where you live?  Yes Need for family participation in patient care:  No (Coment)  Care giving concerns:  Patient has been living at home alone but currently needs a short term rehab stay in order to improve ability to mobilize and provide self care before returning home.   Social Worker assessment / plan:  CSW alerted by RN that patient had changed his mind and was planning on going to rehab at discharge. CSW met with patient and patient's daughter, Donald Calderon, at bedside to discuss discharge planning. Patient acknowledged that he can't go home and care for himself at this point. Patient and patient's daughter were aware of [facility] and requested placement, if available. CSW to complete referral and fax out to determine bed availability. CSW to follow to facilitate discharge when medically ready. Patient's daughter also requested information on completing Advance Directives; CSW provided packet for family to fill out and alerted RN to contact pastoral care for review when complete.  Employment status:  Retired Nurse, adult PT  Recommendations:  [place] / Referral to community resources:  [place]  Patient/Family's Response to care:  Patient now agreeable to SNF placement.  Patient/Family's Understanding of and Emotional Response to Diagnosis, Current Treatment, and Prognosis:  Patient acknowledged that he did not really want to go to a nursing facility, but knew that he could not care for himself at home at this time. Patient's daughter discussed how she did not know much about rehab placements and had questions. Patient and patient's daughter indicated understanding of CSW role in discharge planning and appreciated the help.  Emotional Assessment Appearance:  Appears stated age Attitude/Demeanor/Rapport:    Affect (typically observed):  Appropriate Orientation:  Oriented to Situation, Oriented to  Time, Oriented to Place, Oriented to Self Alcohol / Substance use:  Not Applicable Psych involvement (Current and /or in the community):  No (Comment)  Discharge Needs  Concerns to be addressed:  Care Coordination Readmission within the last 30 days:  Yes Current discharge risk:  Physical Impairment Barriers to Discharge:  Ship broker, Continued Medical Work up   [employer], [place] 03/01/2017, 1:35 PM

## 2017-03-01 NOTE — Progress Notes (Addendum)
Donald Calderon to be D/C'd Skilled nursing facility per MD order.  Discussed with the patient and all questions fully answered.  VSS, Skin clean, dry and intact without evidence of skin break down, no evidence of skin tears noted. IV catheter discontinued intact. Site without signs and symptoms of complications. Dressing and pressure applied. Pt still has foley catheter in per MD Arrien's order.  Report attempted to [place] x2. No one answered.  Patient escorted via PTAR. [place] 03/01/2017 4:04 PM  7:30- [place] called back to ask about pt getting Keflex with his Penicillins allergy. This RN called pharmacy to find out. Pharmacy said that pt has been taking cephlasporins without reaction and that it is fine for him to take Keflex. Message giving to RN at [employer].

## 2017-03-01 NOTE — Progress Notes (Signed)
Physical Therapy Treatment Patient Details Name: Donald Calderon MRN: [id number] DOB: 1940-12-23 Today's Date: 03/01/2017    History of Present Illness pt is a 76 y/o male with pmh significant for HTN, DM2, alcohol abuse, chronic back pain, (recent admission to CIR after lumbar exploration and decompresion at L3/4 and L 2/3, respectively with complication of L2 hematoma necessitating return to OR), now presenting with fever, generalized malaise and c/o lower abdominal pain referred to his back.  Associated symptoms including cloudy urine, N/V and decreased appetitie.  Workin dx incl sepsis due to UTI.    PT Comments    Pt's function limited by L knee effusion and pain.  Emphasis today on gentle A/AAROM to L knee, AROM to bil LE's, work on scoot to EOB,  Sit to stand and pre-gait activity (due to unable to initially move in standing).  Finally pivot to chair with RW and technique to decrease w/bearing on the L LE.    Follow Up Recommendations  SNF     Equipment Recommendations  None recommended by PT    Recommendations for Other Services       Precautions / Restrictions Precautions Precautions: Fall Precaution Comments: Pt is able to report 3/3 back precautions  Required Braces or Orthoses: Spinal Brace Spinal Brace: Lumbar corset;Applied in sitting position Restrictions Weight Bearing Restrictions: No    Mobility  Bed Mobility Overal bed mobility: Needs Assistance Bed Mobility: Supine to Sit     Supine to sit: Min assist     General bed mobility comments: cues and assist for bridging and stability assist as pt used rail to come forward.  Transfers Overall transfer level: Needs assistance   Transfers: Sit to/from Stand;Stand Pivot Transfers Sit to Stand: Min assist Stand pivot transfers: Min assist       General transfer comment: pt able to do more each trial of standing and each attempt to move L LE.  Still very heavy use fo the RW due to pain.  Ambulation/Gait              General Gait Details: unable today due to pain from L knee effusion.   Stairs            Wheelchair Mobility    Modified Rankin (Stroke Patients Only)       Balance Overall balance assessment: Needs assistance   Sitting balance-Leahy Scale: Fair       Standing balance-Leahy Scale: Poor Standing balance comment: heavy reliance on the RW                            Cognition Arousal/Alertness: Awake/alert Behavior During Therapy: WFL for tasks assessed/performed Overall Cognitive Status: Within Functional Limits for tasks assessed                                        Exercises      General Comments        Pertinent Vitals/Pain Pain Assessment: Faces Faces Pain Scale: Hurts whole lot Pain Location: L knee Pain Descriptors / Indicators: Grimacing;Guarding;Sharp;Sore Pain Intervention(s): Limited activity within patient's tolerance    Home Living                      Prior Function            PT Goals (current goals can now be  found in the care plan section) Acute Rehab PT Goals Patient Stated Goal: walk better PT Goal Formulation: With patient Time For Goal Achievement: 03/10/17 Potential to Achieve Goals: Good Progress towards PT goals: Not progressing toward goals - comment (L knee limiting function)    Frequency    Min 3X/week      PT Plan Current plan remains appropriate    Co-evaluation              AM-PAC PT "6 Clicks" Daily Activity  Outcome Measure  Difficulty turning over in bed (including adjusting bedclothes, sheets and blankets)?: Unable Difficulty moving from lying on back to sitting on the side of the bed? : Unable Difficulty sitting down on and standing up from a chair with arms (e.g., wheelchair, bedside commode, etc,.)?: Unable Help needed moving to and from a bed to chair (including a wheelchair)?: A Lot Help needed walking in hospital room?: A Lot Help needed  climbing 3-5 steps with a railing? : A Lot 6 Click Score: 9    End of Session   Activity Tolerance: Patient limited by pain Patient left: in chair;with call bell/phone within reach;with chair alarm set Nurse Communication: Mobility status PT Visit Diagnosis: Unsteadiness on feet (R26.81);Other abnormalities of gait and mobility (R26.89);Pain Pain - Right/Left: Left Pain - part of body: Knee     Time: 1201-1225 PT Time Calculation (min) (ACUTE ONLY): 24 min  Charges:  $Therapeutic Exercise: 8-22 mins $Therapeutic Activity: 8-22 mins                    G Codes:       2017/03/02  [name], PT [phone]  (pager)   [name] 03-02-2017, 12:35 PM

## 2017-03-01 NOTE — Care Management Important Message (Signed)
Important Message  Patient Details  Name: Donald Calderon MRN: [id number] Date of Birth: 03/08/1941   Medicare Important Message Given:  Yes    Nathen May 03/01/2017, 11:20 AM

## 2017-03-01 NOTE — Discharge Summary (Signed)
Physician Discharge Summary  ALAN DRUMMER GEZ:[id number] DOB: 1941-03-16 DOA: 02/22/2017  PCP: [name], NP  Admit date: 02/22/2017 Discharge date: 03/01/2017  Admitted From: Home Disposition:   SNF  Recommendations for Outpatient Follow-up:  1. Follow up with PCP in 1 week 2. Please obtain BMP in 7 days 3. Patient will need voiding trial as outpatient in 7 days  Home Health: Na  Equipment/Devices: Walker   Discharge Condition: Stable CODE STATUS: Full  Diet recommendation:  Cardiac and diabetics prudent  Brief/Interim Summary: 76 year old male who presented with fever. Patient does have a significant past medical history for hypertension, type 2 diabetes mellitus, and chronic back pain. Recent hospitalization August 2018 for L3, L4 exploration, lumbar decompression Y5-K3, complicated by L2 hematoma. Patient was successfully surgically intervened, and discharged to rehabilitation. Over last 2 weeks he has been at home, having difficulty ambulating, frequent falls. He has been self catheterizing due to bladder dysfunction and urinary retention. The day of admission he was noted to be febrile, confused and having lower abdominal pain. On the initial physical examination, blood pressure 120/67, heart rate 104, respirations 16, temperature 101.3, oxygen saturation 92%. Dry mucous membranes, lungs were clear to auscultation bilaterally, heart S1-S2 present tachycardic, no gallops, rubs or murmurs, abdomen was soft, distended, tender to palpation at the lower quadrants, no mass palpated, no lower extremity edema. His urinalysis had too numerous count white cells. Sodium 131, potassium 3.9, chloride 97, bicarbonate 23, glucose 130, BUN 41, creatinine 2.71, white count 16.2, hemoglobin 9.2, hematocrit 28.1, platelets 206, venous lactic acid 1.7. His chest x-ray was negative for infiltrates. Renal ultrasound with no hydronephrosis.  Patient was admitted to the hospital working diagnosis of  sepsis due to urinary tract infection, present on admission.   1. Sepsis due to urinary tract infection, Escherichia coli, present on admission, complicated by metabolic/septic encephalopathy. Patient was admitted to the medical ward, he was placed on a remote telemetry monitor, intravenous crystalloid solutions, and broad spectrum antibiotic therapy. He is  urine culture came back positive for Escherichia coli which was pan-sensitive, antibiotics were de-scalated to cefazolin with good toleration, patient will completed therapy with oral cephalexin. Discharge white cell count 13.9, patient has remained afebrile. Recommend to keep indwelling Foley catheter and do a voiding trial as an outpatient. Patient with significant deconditioning, evaluated by physical therapy, with recommendations for skilled nursing facility, for rehabilitation.   2. Acute kidney injury. Likely multifactorial, including prerenal, patient responded well to IV fluids, he received crystalloid solutions, discharge creatinine is 1.4, potassium of 3.2 and serum bicarbonate 23. Will recommend follow-up chemistry as an outpatient in 7 days.   3. Ileus. Patient developed a small bowel ileus, which was treated conservatively, unable to place NG tube. Eventually his diet was advanced with good toleration. He does have chronic abdominal distention.   4. Hypertension. Continue amlodipine for blood pressure control. Will hold lisinopril to avoid further kidney injury.  5. Type 2 diabetes mellitus. Patient was placed on insulin sliding scale for glucose coverage and monitoring, History glucose 181, 176, 202, 140, 138. Patient will resume Levemir at discharge.  6. Chronic back pain with ambulatory dysfunction, complicated by acute left knee arthritis. Patient received physical therapy, pain control hydrocodone, acetaminophen and cyclobenzaprine. Topical oral pain to his left knee. Patient will be discharged to a skilled facility for  rehabilitation.  7. BPH, bladder dysfunction and urinary retention. Continue indwelling Foley catheter, will recommend outpatient voiding trial. Continue Flomax.   Discharge Diagnoses:  Principal Problem:   Sepsis secondary to UTI [facility]) Active Problems:   Chronic back pain   Diabetes mellitus type 2 in obese (HCC)   Benign essential HTN   Normocytic anemia   Neurogenic bladder   AKI (acute kidney injury) ([place])    Discharge Instructions   Allergies as of 03/01/2017      Reactions   Penicillins Itching, Rash   Has patient had a PCN reaction causing immediate rash, facial/tongue/throat swelling, SOB or lightheadedness with hypotension: No Has patient had a PCN reaction causing severe rash involving mucus membranes or skin necrosis: No Has patient had a PCN reaction that required hospitalization: No Has patient had a PCN reaction occurring within the last 10 years: No If all of the above answers are "NO", then may proceed with Cephalosporin use. Tolerated Ancef      Medication List    STOP taking these medications   lidocaine 2 % jelly Commonly known as:  XYLOCAINE   lisinopril 20 MG tablet Commonly known as:  PRINIVIL,ZESTRIL   traMADol 50 MG tablet Commonly known as:  ULTRAM     TAKE these medications   acetaminophen 325 MG tablet Commonly known as:  TYLENOL Take 1-2 tablets (325-650 mg total) by mouth every 4 (four) hours as needed for mild pain.   ALKA-SELTZER ANTACID PO Take 1 tablet by mouth as needed (heartburn).   amLODipine 10 MG tablet Commonly known as:  NORVASC Take 1 tablet (10 mg total) by mouth daily.   bethanechol 25 MG tablet Commonly known as:  URECHOLINE Take 1 tablet (25 mg total) by mouth 3 (three) times daily.   bisacodyl 5 MG EC tablet Commonly known as:  DULCOLAX Take 1 tablet (5 mg total) by mouth daily as needed for moderate constipation.   cephALEXin 500 MG capsule Commonly known as:  KEFLEX Take 1 capsule (500 mg total) by  mouth every 12 (twelve) hours.   cyclobenzaprine 5 MG tablet Commonly known as:  FLEXERIL Take 1 tablet (5 mg total) by mouth 3 (three) times daily as needed for muscle spasms.   diclofenac sodium 1 % Gel Commonly known as:  VOLTAREN Apply 2 g topically 4 (four) times daily.   gabapentin 100 MG capsule Commonly known as:  NEURONTIN Take 1 capsule (100 mg total) by mouth 3 (three) times daily.   HYDROcodone-acetaminophen 10-325 MG tablet Commonly known as:  NORCO Take 1 tablet by mouth every 12 (twelve) hours as needed for severe pain. What changed:  how much to take   hydrocortisone 2.5 % rectal cream Commonly known as:  ANUSOL-HC Place rectally 4 (four) times daily. What changed:  how much to take  when to take this  reasons to take this   insulin aspart 100 UNIT/ML injection Commonly known as:  novoLOG Inject 4-6 Units into the skin 3 (three) times daily before meals. Per sliding scale . If CBG>150 give 4 units, if CBG >200 give 6 units.   LEVEMIR 100 UNIT/ML injection Generic drug:  insulin detemir Inject 0.1-0.3 mLs (10-30 Units total) into the skin 2 (two) times daily. 10 units every morning, and 34 units every evening   pantoprazole 40 MG tablet Commonly known as:  PROTONIX Take 1 tablet (40 mg total) by mouth daily.   polyethylene glycol packet Commonly known as:  MIRALAX / GLYCOLAX Take 17 g by mouth 3 (three) times daily with meals.   pravastatin 10 MG tablet Commonly known as:  PRAVACHOL Take 1 tablet (10 mg total) by  mouth every evening.   tamsulosin 0.4 MG Caps capsule Commonly known as:  FLOMAX Take 1 capsule (0.4 mg total) by mouth 2 (two) times daily.            Discharge Care Instructions        Start     Ordered   03/01/17 0000  diclofenac sodium (VOLTAREN) 1 % GEL  4 times daily     03/01/17 1155   03/01/17 0000  pantoprazole (PROTONIX) 40 MG tablet  Daily     03/01/17 1155   03/01/17 0000  HYDROcodone-acetaminophen (NORCO) 10-325  MG tablet  Every 12 hours PRN     03/01/17 1155   03/01/17 0000  Increase activity slowly     03/01/17 1155   03/01/17 0000  Diet - low sodium heart healthy     03/01/17 1155   03/01/17 0000  Discharge instructions    Comments:  Please follow with primary care in 7 days Please follow with urology in 7 days, for voiding trial   03/01/17 1155   03/01/17 0000  bisacodyl (DULCOLAX) 5 MG EC tablet  Daily PRN     03/01/17 1155   03/01/17 0000  cephALEXin (KEFLEX) 500 MG capsule  Every 12 hours     03/01/17 1155      Allergies  Allergen Reactions  . Penicillins Itching and Rash    Has patient had a PCN reaction causing immediate rash, facial/tongue/throat swelling, SOB or lightheadedness with hypotension: No Has patient had a PCN reaction causing severe rash involving mucus membranes or skin necrosis: No Has patient had a PCN reaction that required hospitalization: No Has patient had a PCN reaction occurring within the last 10 years: No If all of the above answers are "NO", then may proceed with Cephalosporin use.  Tolerated Ancef     Consultations:     Procedures/Studies: Dg Knee 1-2 Views Left  Result Date: 02/28/2017 CLINICAL DATA:  Question recent injury/ dislocation.  Pain. EXAM: LEFT KNEE - 1-2 VIEW COMPARISON:  None. FINDINGS: Large knee joint effusion. No evidence of fracture or dislocation. Tricompartmental osteoarthritis is noted. IMPRESSION: Large joint effusion.  Osteoarthritis.  No acute finding otherwise. Electronically Signed   By: Nelson Chimes M.D.   On: 02/28/2017 13:19   Dg Abd 1 View  Result Date: 02/27/2017 CLINICAL DATA:  Ileus. EXAM: ABDOMEN - 1 VIEW COMPARISON:  February 26, 2017 FINDINGS: Prominent air-filled loops of large and small bowel are similar in the interval most consistent with the ileus. No other interval changes or acute abnormalities. IMPRESSION: Stable bowel gas pattern most consistent with ileus. Electronically Signed   By: Dorise Bullion  III M.D   On: 02/27/2017 10:25   Dg Abd 1 View  Result Date: 02/25/2017 CLINICAL DATA:  Abdominal pain.  Abdominal distention . EXAM: ABDOMEN - 1 VIEW COMPARISON:  Ultrasound 02/25/2017.  Abdominal series 02/22/2017 . FINDINGS: Distended loops of small and large bowel are noted suggesting adynamic ileus. No pathologic intra-abdominal calcifications noted. Prior lumbar spine fusion. IMPRESSION: Distended loops of small and large bowel noted suggesting adynamic ileus. Electronically Signed   By: Marcello Moores  Register   On: 02/25/2017 10:42   Dg Abd 1 View  Result Date: 02/22/2017 CLINICAL DATA:  Pain over the bladder. Patient thinks he has a urinary tract infection. Symptoms began 2 days ago with dark urine. EXAM: ABDOMEN - 1 VIEW COMPARISON:  None. FINDINGS: The bowel gas pattern is unremarkable. No radio-opaque calculi. L4-5 spinal fusion hardware is again  noted. No evidence of pneumatosis or pneumoperitoneum. IMPRESSION: Stable appearance of the abdomen and pelvis. No bowel obstruction noted. Electronically Signed   By: Ashley Royalty M.D.   On: 02/22/2017 23:00   Dg Abd 1 View  Result Date: 01/31/2017 CLINICAL DATA:  Ileus.  Inpatient.  Recent lumbar spine surgery. EXAM: ABDOMEN - 1 VIEW COMPARISON:  01/26/2017 abdominal radiograph. FINDINGS: Posterior spinal fusion hardware overlies the lower lumbar spine. Mild gaseous distention of small bowel loops throughout the abdomen up to 3.2 cm diameter, not appreciably changed. Minimal colonic stool. No evidence of pneumatosis or pneumoperitoneum. No radiopaque nephrolithiasis. Clear lung bases. IMPRESSION: No appreciable change in mild diffuse gaseous distention of the small bowel, compatible with the provided history of adynamic ileus. Electronically Signed   By: Ilona Sorrel M.D.   On: 01/31/2017 12:29   US Renal  Result Date: 02/25/2017 CLINICAL DATA:  Lower abdominal and back pain. EXAM: RENAL / URINARY TRACT ULTRASOUND COMPLETE COMPARISON:  Renal ultrasound  dated February 22, 2017. FINDINGS: Right Kidney: Length: 14.6 cm. Echogenicity within normal limits. No mass or hydronephrosis visualized. Left Kidney: Length: 12.3 cm. Echogenicity within normal limits. No mass or hydronephrosis visualized. Bladder: Decompressed by Foley catheter. IMPRESSION: Normal sonographic appearance of the bilateral kidneys. Electronically Signed   By: Titus Dubin M.D.   On: 02/25/2017 10:17   US Renal  Result Date: 02/22/2017 CLINICAL DATA:  Acute renal failure EXAM: RENAL / URINARY TRACT ULTRASOUND COMPLETE COMPARISON:  None. FINDINGS: Right Kidney: Length: 13.5 cm. Cortical-medullary distinction is maintained. No obstructive uropathy. No focal renal mass. No nephrolithiasis. Left Kidney: Length: 12.3 cm. Cortical-medullary distinction is maintained. No obstructive uropathy or focal renal mass. No nephrolithiasis. Bladder: Contracted with Foley catheter in place. Mural thickening may be due to contracted the decompressed appearance of the bladder. IMPRESSION: No sonographic findings for the patient's acute renal failure. Electronically Signed   By: Ashley Royalty M.D.   On: 02/22/2017 23:18   Dg Chest Portable 1 View  Result Date: 02/22/2017 CLINICAL DATA:  76 y/o  M; either. EXAM: PORTABLE CHEST 1 VIEW COMPARISON:  04/11/2010 chest radiograph. FINDINGS: Stable normal cardiac silhouette given projection and technique. Low lung volumes accentuate pulmonary markings. Aortic atherosclerosis with calcification. Minor bibasilar atelectasis. No focal consolidation. No pleural effusion or pneumothorax. No acute osseous abnormality is evident. IMPRESSION: Low lung volumes. Minor bibasilar atelectasis. No focal consolidation. Electronically Signed   By: Kristine Garbe M.D.   On: 02/22/2017 18:07   Dg Abd Portable 1v  Result Date: 02/26/2017 CLINICAL DATA:  76 year old male with pain. EXAM: PORTABLE ABDOMEN - 1 VIEW COMPARISON:  02/25/2017 FINDINGS: Dilated loops of small and  large bowel are again noted diffusely throughout the abdomen. Evaluation for subtle free intraperitoneal air and air-fluid levels is limited on supine only radiographs. Stable surgical hardware in the lower lumbar spine. Visualized osseous structures grossly intact. IMPRESSION: Multiple distended loops of small and large bowel throughout the abdomen suggesting adynamic ileus. The appearance is similar to prior exam. Electronically Signed   By: Kristopher Oppenheim M.D.   On: 02/26/2017 14:23       Subjective: Patient tolerating po diet, well. Positive flatus, no nausea or vomiting, positive left knee pain, improved with local and systemic analgesics.   Discharge Exam: Vitals:   03/01/17 0411 03/01/17 0847  BP: (!) 147/77 (!) 156/74  Pulse: 83   Resp: 16   Temp: 99.6 F (37.6 C)   SpO2: 95%    Vitals:   02/28/17  2125 02/28/17 2140 03/01/17 0411 03/01/17 0847  BP: (!) 144/73 (!) 149/75 (!) 147/77 (!) 156/74  Pulse: 93 88 83   Resp: 18 16 16    Temp: 99.2 F (37.3 C) 99.8 F (37.7 C) 99.6 F (37.6 C)   TempSrc: Oral Oral Oral   SpO2: 98% 97% 95%   Weight:      Height:        General: Pt is alert, awake, not in acute distress E ENT: no pallor or icterus, oral mucosa moist.  Cardiovascular: RRR, S1/S2 +, no rubs, no gallops Respiratory: CTA bilaterally, no wheezing, no rhonchi Abdominal: Soft, NT, ND, bowel sounds +, mild distended Extremities: no edema, no cyanosis    The results of significant diagnostics from this hospitalization (including imaging, microbiology, ancillary and laboratory) are listed below for reference.     Microbiology: Recent Results (from the past 240 hour(s))  Culture, blood (routine x 2)     Status: Abnormal   Collection Time: 02/22/17  5:15 PM  Result Value Ref Range Status   Specimen Description BLOOD RIGHT ARM  Final   Special Requests   Final    BOTTLES DRAWN AEROBIC AND ANAEROBIC Blood Culture results may not be optimal due to an excessive volume  of blood received in culture bottles   Culture  Setup Time   Final    GRAM NEGATIVE RODS AEROBIC BOTTLE ONLY CRITICAL VALUE NOTED.  VALUE IS CONSISTENT WITH PREVIOUSLY REPORTED AND CALLED VALUE.    Culture (A)  Final    ESCHERICHIA COLI SUSCEPTIBILITIES PERFORMED ON PREVIOUS CULTURE WITHIN THE LAST 5 DAYS.    Report Status 02/28/2017 FINAL  Final  Culture, blood (routine x 2)     Status: Abnormal   Collection Time: 02/22/17  5:45 PM  Result Value Ref Range Status   Specimen Description BLOOD LEFT ARM  Final   Special Requests   Final    BOTTLES DRAWN AEROBIC AND ANAEROBIC Blood Culture results may not be optimal due to an excessive volume of blood received in culture bottles   Culture  Setup Time   Final    GRAM NEGATIVE RODS AEROBIC BOTTLE ONLY CRITICAL RESULT CALLED TO, READ BACK BY AND VERIFIED WITH: PHARMD M TURNER [id number] MLM GRAM POSITIVE COCCI IN CLUSTERS ANAEROBIC BOTTLE ONLY CRITICAL RESULT CALLED TO, READ BACK BY AND VERIFIED WITH: V.BRYK PHARMD 02/24/17 0342 L.CHAMPION    Culture (A)  Final    ESCHERICHIA COLI STAPHYLOCOCCUS SPECIES (COAGULASE NEGATIVE) THE SIGNIFICANCE OF ISOLATING THIS ORGANISM FROM A SINGLE SET OF BLOOD CULTURES WHEN MULTIPLE SETS ARE DRAWN IS UNCERTAIN. PLEASE NOTIFY THE MICROBIOLOGY DEPARTMENT WITHIN ONE WEEK IF SPECIATION AND SENSITIVITIES ARE REQUIRED.    Report Status 02/26/2017 FINAL  Final   Organism ID, Bacteria ESCHERICHIA COLI  Final      Susceptibility   Escherichia coli - MIC*    AMPICILLIN <=2 SENSITIVE Sensitive     CEFAZOLIN <=4 SENSITIVE Sensitive     CEFEPIME <=1 SENSITIVE Sensitive     CEFTAZIDIME <=1 SENSITIVE Sensitive     CEFTRIAXONE <=1 SENSITIVE Sensitive     CIPROFLOXACIN <=0.25 SENSITIVE Sensitive     GENTAMICIN <=1 SENSITIVE Sensitive     IMIPENEM <=0.25 SENSITIVE Sensitive     TRIMETH/SULFA <=20 SENSITIVE Sensitive     AMPICILLIN/SULBACTAM <=2 SENSITIVE Sensitive     PIP/TAZO <=4 SENSITIVE Sensitive     Extended  ESBL NEGATIVE Sensitive     * ESCHERICHIA COLI  Blood Culture ID Panel (Reflexed)  Status: Abnormal   Collection Time: 02/22/17  5:45 PM  Result Value Ref Range Status   Enterococcus species NOT DETECTED NOT DETECTED Final   Vancomycin resistance NOT DETECTED NOT DETECTED Final   Listeria monocytogenes NOT DETECTED NOT DETECTED Final   Staphylococcus species NOT DETECTED NOT DETECTED Final   Staphylococcus aureus NOT DETECTED NOT DETECTED Final   Methicillin resistance NOT DETECTED NOT DETECTED Final   Streptococcus species NOT DETECTED NOT DETECTED Final   Streptococcus agalactiae NOT DETECTED NOT DETECTED Final   Streptococcus pneumoniae NOT DETECTED NOT DETECTED Final   Streptococcus pyogenes NOT DETECTED NOT DETECTED Final   Acinetobacter baumannii NOT DETECTED NOT DETECTED Final   Enterobacteriaceae species DETECTED (A) NOT DETECTED Final    Comment: CRITICAL RESULT CALLED TO, READ BACK BY AND VERIFIED WITH: PHARMD M TURNER [id number] MLM    Enterobacter cloacae complex NOT DETECTED NOT DETECTED Final   Escherichia coli DETECTED (A) NOT DETECTED Final    Comment: CRITICAL RESULT CALLED TO, READ BACK BY AND VERIFIED WITH: PHARMD M TURNER [id number] MLM    Klebsiella oxytoca NOT DETECTED NOT DETECTED Final   Klebsiella pneumoniae NOT DETECTED NOT DETECTED Final   Proteus species NOT DETECTED NOT DETECTED Final   Serratia marcescens NOT DETECTED NOT DETECTED Final   Carbapenem resistance NOT DETECTED NOT DETECTED Final   Haemophilus influenzae NOT DETECTED NOT DETECTED Final   Neisseria meningitidis NOT DETECTED NOT DETECTED Final   Pseudomonas aeruginosa NOT DETECTED NOT DETECTED Final   Candida albicans NOT DETECTED NOT DETECTED Final   Candida glabrata NOT DETECTED NOT DETECTED Final   Candida krusei NOT DETECTED NOT DETECTED Final   Candida parapsilosis NOT DETECTED NOT DETECTED Final   Candida tropicalis NOT DETECTED NOT DETECTED Final  Blood Culture ID Panel  (Reflexed)     Status: Abnormal   Collection Time: 02/22/17  5:45 PM  Result Value Ref Range Status   Enterococcus species NOT DETECTED NOT DETECTED Final   Vancomycin resistance NOT DETECTED NOT DETECTED Final   Listeria monocytogenes NOT DETECTED NOT DETECTED Final   Staphylococcus species DETECTED (A) NOT DETECTED Final    Comment: CRITICAL RESULT CALLED TO, READ BACK BY AND VERIFIED WITH: V.BRYK PHARMD 02/24/17 0342 L.CHAMPION    Staphylococcus aureus NOT DETECTED NOT DETECTED Final   Methicillin resistance NOT DETECTED NOT DETECTED Final   Streptococcus species NOT DETECTED NOT DETECTED Final   Streptococcus agalactiae NOT DETECTED NOT DETECTED Final   Streptococcus pneumoniae NOT DETECTED NOT DETECTED Final   Streptococcus pyogenes NOT DETECTED NOT DETECTED Final   Acinetobacter baumannii NOT DETECTED NOT DETECTED Final   Enterobacteriaceae species NOT DETECTED NOT DETECTED Final   Enterobacter cloacae complex NOT DETECTED NOT DETECTED Final   Escherichia coli NOT DETECTED NOT DETECTED Final   Klebsiella oxytoca NOT DETECTED NOT DETECTED Final   Klebsiella pneumoniae NOT DETECTED NOT DETECTED Final   Proteus species NOT DETECTED NOT DETECTED Final   Serratia marcescens NOT DETECTED NOT DETECTED Final   Carbapenem resistance NOT DETECTED NOT DETECTED Final   Haemophilus influenzae NOT DETECTED NOT DETECTED Final   Neisseria meningitidis NOT DETECTED NOT DETECTED Final   Pseudomonas aeruginosa NOT DETECTED NOT DETECTED Final   Candida albicans NOT DETECTED NOT DETECTED Final   Candida glabrata NOT DETECTED NOT DETECTED Final   Candida krusei NOT DETECTED NOT DETECTED Final   Candida parapsilosis NOT DETECTED NOT DETECTED Final   Candida tropicalis NOT DETECTED NOT DETECTED Final  Urine culture  Status: Abnormal   Collection Time: 02/22/17  5:55 PM  Result Value Ref Range Status   Specimen Description URINE, CATHETERIZED  Final   Special Requests NONE  Final   Culture  >=100,000 COLONIES/mL ESCHERICHIA COLI (A)  Final   Report Status 02/25/2017 FINAL  Final   Organism ID, Bacteria ESCHERICHIA COLI (A)  Final      Susceptibility   Escherichia coli - MIC*    AMPICILLIN <=2 SENSITIVE Sensitive     CEFAZOLIN <=4 SENSITIVE Sensitive     CEFTRIAXONE <=1 SENSITIVE Sensitive     CIPROFLOXACIN <=0.25 SENSITIVE Sensitive     GENTAMICIN <=1 SENSITIVE Sensitive     IMIPENEM <=0.25 SENSITIVE Sensitive     NITROFURANTOIN <=16 SENSITIVE Sensitive     TRIMETH/SULFA <=20 SENSITIVE Sensitive     AMPICILLIN/SULBACTAM <=2 SENSITIVE Sensitive     PIP/TAZO <=4 SENSITIVE Sensitive     Extended ESBL NEGATIVE Sensitive     * >=100,000 COLONIES/mL ESCHERICHIA COLI  Culture, blood (Routine X 2) w Reflex to ID Panel     Status: None (Preliminary result)   Collection Time: 02/26/17  2:55 PM  Result Value Ref Range Status   Specimen Description BLOOD RIGHT ANTECUBITAL  Final   Special Requests   Final    BOTTLES DRAWN AEROBIC AND ANAEROBIC Blood Culture adequate volume   Culture NO GROWTH 2 DAYS  Final   Report Status PENDING  Incomplete  Culture, blood (Routine X 2) w Reflex to ID Panel     Status: None (Preliminary result)   Collection Time: 02/26/17  2:59 PM  Result Value Ref Range Status   Specimen Description BLOOD RIGHT ANTECUBITAL  Final   Special Requests   Final    BOTTLES DRAWN AEROBIC AND ANAEROBIC Blood Culture adequate volume   Culture NO GROWTH 2 DAYS  Final   Report Status PENDING  Incomplete     Labs: BNP (last 3 results) No results for input(s): BNP in the last 8760 hours. Basic Metabolic Panel:  Recent Labs Lab 02/23/17 0658 02/25/17 0410 02/27/17 0457 02/28/17 0531 03/01/17 0324  NA 133* 135 138 136 139  K 3.7 3.4* 3.4* 3.1* 3.2*  CL 101 102 107 104 108  CO2 22 24 22 23 23   GLUCOSE 115* 143* 138* 150* 131*  BUN 39* 39* 25* 16 12  CREATININE 2.48* 2.03* 1.65* 1.47* 1.46*  CALCIUM 8.0* 8.1* 8.0* 7.9* 8.1*   Liver Function Tests:  Recent  Labs Lab 02/22/17 1652  AST 22  ALT 19  ALKPHOS 78  BILITOT 1.0  PROT 5.8*  ALBUMIN 2.5*   No results for input(s): LIPASE, AMYLASE in the last 168 hours. No results for input(s): AMMONIA in the last 168 hours. CBC:  Recent Labs Lab 02/22/17 1652 02/22/17 1751 02/23/17 0658 02/25/17 0410 02/27/17 0457  WBC 16.2*  --  15.7* 17.7* 13.9*  NEUTROABS 14.4*  --   --  15.4* 11.3*  HGB 9.2* 9.2* 8.7* 9.5* 8.9*  HCT 28.1* 27.0* 26.6* 28.3* 27.3*  MCV 87.8  --  87.8 87.1 87.5  PLT 206  --  211 276 300   Cardiac Enzymes: No results for input(s): CKTOTAL, CKMB, CKMBINDEX, TROPONINI in the last 168 hours. BNP: Invalid input(s): POCBNP CBG:  Recent Labs Lab 02/28/17 1332 02/28/17 1831 02/28/17 2137 03/01/17 0408 03/01/17 0738  GLUCAP 181* 176* 202* 140* 138*   D-Dimer No results for input(s): DDIMER in the last 72 hours. Hgb A1c No results for input(s): HGBA1C in the last  72 hours. Lipid Profile No results for input(s): CHOL, HDL, LDLCALC, TRIG, CHOLHDL, LDLDIRECT in the last 72 hours. Thyroid function studies No results for input(s): TSH, T4TOTAL, T3FREE, THYROIDAB in the last 72 hours.  Invalid input(s): FREET3 Anemia work up No results for input(s): VITAMINB12, FOLATE, FERRITIN, TIBC, IRON, RETICCTPCT in the last 72 hours. Urinalysis    Component Value Date/Time   COLORURINE AMBER (A) 02/22/2017 1755   APPEARANCEUR TURBID (A) 02/22/2017 1755   LABSPEC 1.012 02/22/2017 1755   PHURINE 5.0 02/22/2017 1755   GLUCOSEU NEGATIVE 02/22/2017 1755   HGBUR LARGE (A) 02/22/2017 1755   HGBUR trace-intact 08/08/2010 1216   BILIRUBINUR NEGATIVE 02/22/2017 1755   KETONESUR NEGATIVE 02/22/2017 1755   PROTEINUR 100 (A) 02/22/2017 1755   UROBILINOGEN 0.2 09/27/2011 1540   NITRITE NEGATIVE 02/22/2017 1755   LEUKOCYTESUR LARGE (A) 02/22/2017 1755   Sepsis Labs Invalid input(s): PROCALCITONIN,  WBC,  LACTICIDVEN Microbiology Recent Results (from the past 240 hour(s))   Culture, blood (routine x 2)     Status: Abnormal   Collection Time: 02/22/17  5:15 PM  Result Value Ref Range Status   Specimen Description BLOOD RIGHT ARM  Final   Special Requests   Final    BOTTLES DRAWN AEROBIC AND ANAEROBIC Blood Culture results may not be optimal due to an excessive volume of blood received in culture bottles   Culture  Setup Time   Final    GRAM NEGATIVE RODS AEROBIC BOTTLE ONLY CRITICAL VALUE NOTED.  VALUE IS CONSISTENT WITH PREVIOUSLY REPORTED AND CALLED VALUE.    Culture (A)  Final    ESCHERICHIA COLI SUSCEPTIBILITIES PERFORMED ON PREVIOUS CULTURE WITHIN THE LAST 5 DAYS.    Report Status 02/28/2017 FINAL  Final  Culture, blood (routine x 2)     Status: Abnormal   Collection Time: 02/22/17  5:45 PM  Result Value Ref Range Status   Specimen Description BLOOD LEFT ARM  Final   Special Requests   Final    BOTTLES DRAWN AEROBIC AND ANAEROBIC Blood Culture results may not be optimal due to an excessive volume of blood received in culture bottles   Culture  Setup Time   Final    GRAM NEGATIVE RODS AEROBIC BOTTLE ONLY CRITICAL RESULT CALLED TO, READ BACK BY AND VERIFIED WITH: PHARMD M TURNER [id number] MLM GRAM POSITIVE COCCI IN CLUSTERS ANAEROBIC BOTTLE ONLY CRITICAL RESULT CALLED TO, READ BACK BY AND VERIFIED WITH: V.BRYK PHARMD 02/24/17 0342 L.CHAMPION    Culture (A)  Final    ESCHERICHIA COLI STAPHYLOCOCCUS SPECIES (COAGULASE NEGATIVE) THE SIGNIFICANCE OF ISOLATING THIS ORGANISM FROM A SINGLE SET OF BLOOD CULTURES WHEN MULTIPLE SETS ARE DRAWN IS UNCERTAIN. PLEASE NOTIFY THE MICROBIOLOGY DEPARTMENT WITHIN ONE WEEK IF SPECIATION AND SENSITIVITIES ARE REQUIRED.    Report Status 02/26/2017 FINAL  Final   Organism ID, Bacteria ESCHERICHIA COLI  Final      Susceptibility   Escherichia coli - MIC*    AMPICILLIN <=2 SENSITIVE Sensitive     CEFAZOLIN <=4 SENSITIVE Sensitive     CEFEPIME <=1 SENSITIVE Sensitive     CEFTAZIDIME <=1 SENSITIVE Sensitive      CEFTRIAXONE <=1 SENSITIVE Sensitive     CIPROFLOXACIN <=0.25 SENSITIVE Sensitive     GENTAMICIN <=1 SENSITIVE Sensitive     IMIPENEM <=0.25 SENSITIVE Sensitive     TRIMETH/SULFA <=20 SENSITIVE Sensitive     AMPICILLIN/SULBACTAM <=2 SENSITIVE Sensitive     PIP/TAZO <=4 SENSITIVE Sensitive     Extended ESBL NEGATIVE Sensitive     *  ESCHERICHIA COLI  Blood Culture ID Panel (Reflexed)     Status: Abnormal   Collection Time: 02/22/17  5:45 PM  Result Value Ref Range Status   Enterococcus species NOT DETECTED NOT DETECTED Final   Vancomycin resistance NOT DETECTED NOT DETECTED Final   Listeria monocytogenes NOT DETECTED NOT DETECTED Final   Staphylococcus species NOT DETECTED NOT DETECTED Final   Staphylococcus aureus NOT DETECTED NOT DETECTED Final   Methicillin resistance NOT DETECTED NOT DETECTED Final   Streptococcus species NOT DETECTED NOT DETECTED Final   Streptococcus agalactiae NOT DETECTED NOT DETECTED Final   Streptococcus pneumoniae NOT DETECTED NOT DETECTED Final   Streptococcus pyogenes NOT DETECTED NOT DETECTED Final   Acinetobacter baumannii NOT DETECTED NOT DETECTED Final   Enterobacteriaceae species DETECTED (A) NOT DETECTED Final    Comment: CRITICAL RESULT CALLED TO, READ BACK BY AND VERIFIED WITH: PHARMD M TURNER [id number] MLM    Enterobacter cloacae complex NOT DETECTED NOT DETECTED Final   Escherichia coli DETECTED (A) NOT DETECTED Final    Comment: CRITICAL RESULT CALLED TO, READ BACK BY AND VERIFIED WITH: PHARMD M TURNER [id number] MLM    Klebsiella oxytoca NOT DETECTED NOT DETECTED Final   Klebsiella pneumoniae NOT DETECTED NOT DETECTED Final   Proteus species NOT DETECTED NOT DETECTED Final   Serratia marcescens NOT DETECTED NOT DETECTED Final   Carbapenem resistance NOT DETECTED NOT DETECTED Final   Haemophilus influenzae NOT DETECTED NOT DETECTED Final   Neisseria meningitidis NOT DETECTED NOT DETECTED Final   Pseudomonas aeruginosa NOT DETECTED NOT  DETECTED Final   Candida albicans NOT DETECTED NOT DETECTED Final   Candida glabrata NOT DETECTED NOT DETECTED Final   Candida krusei NOT DETECTED NOT DETECTED Final   Candida parapsilosis NOT DETECTED NOT DETECTED Final   Candida tropicalis NOT DETECTED NOT DETECTED Final  Blood Culture ID Panel (Reflexed)     Status: Abnormal   Collection Time: 02/22/17  5:45 PM  Result Value Ref Range Status   Enterococcus species NOT DETECTED NOT DETECTED Final   Vancomycin resistance NOT DETECTED NOT DETECTED Final   Listeria monocytogenes NOT DETECTED NOT DETECTED Final   Staphylococcus species DETECTED (A) NOT DETECTED Final    Comment: CRITICAL RESULT CALLED TO, READ BACK BY AND VERIFIED WITH: V.BRYK PHARMD 02/24/17 0342 L.CHAMPION    Staphylococcus aureus NOT DETECTED NOT DETECTED Final   Methicillin resistance NOT DETECTED NOT DETECTED Final   Streptococcus species NOT DETECTED NOT DETECTED Final   Streptococcus agalactiae NOT DETECTED NOT DETECTED Final   Streptococcus pneumoniae NOT DETECTED NOT DETECTED Final   Streptococcus pyogenes NOT DETECTED NOT DETECTED Final   Acinetobacter baumannii NOT DETECTED NOT DETECTED Final   Enterobacteriaceae species NOT DETECTED NOT DETECTED Final   Enterobacter cloacae complex NOT DETECTED NOT DETECTED Final   Escherichia coli NOT DETECTED NOT DETECTED Final   Klebsiella oxytoca NOT DETECTED NOT DETECTED Final   Klebsiella pneumoniae NOT DETECTED NOT DETECTED Final   Proteus species NOT DETECTED NOT DETECTED Final   Serratia marcescens NOT DETECTED NOT DETECTED Final   Carbapenem resistance NOT DETECTED NOT DETECTED Final   Haemophilus influenzae NOT DETECTED NOT DETECTED Final   Neisseria meningitidis NOT DETECTED NOT DETECTED Final   Pseudomonas aeruginosa NOT DETECTED NOT DETECTED Final   Candida albicans NOT DETECTED NOT DETECTED Final   Candida glabrata NOT DETECTED NOT DETECTED Final   Candida krusei NOT DETECTED NOT DETECTED Final   Candida  parapsilosis NOT DETECTED NOT DETECTED Final   Candida  tropicalis NOT DETECTED NOT DETECTED Final  Urine culture     Status: Abnormal   Collection Time: 02/22/17  5:55 PM  Result Value Ref Range Status   Specimen Description URINE, CATHETERIZED  Final   Special Requests NONE  Final   Culture >=100,000 COLONIES/mL ESCHERICHIA COLI (A)  Final   Report Status 02/25/2017 FINAL  Final   Organism ID, Bacteria ESCHERICHIA COLI (A)  Final      Susceptibility   Escherichia coli - MIC*    AMPICILLIN <=2 SENSITIVE Sensitive     CEFAZOLIN <=4 SENSITIVE Sensitive     CEFTRIAXONE <=1 SENSITIVE Sensitive     CIPROFLOXACIN <=0.25 SENSITIVE Sensitive     GENTAMICIN <=1 SENSITIVE Sensitive     IMIPENEM <=0.25 SENSITIVE Sensitive     NITROFURANTOIN <=16 SENSITIVE Sensitive     TRIMETH/SULFA <=20 SENSITIVE Sensitive     AMPICILLIN/SULBACTAM <=2 SENSITIVE Sensitive     PIP/TAZO <=4 SENSITIVE Sensitive     Extended ESBL NEGATIVE Sensitive     * >=100,000 COLONIES/mL ESCHERICHIA COLI  Culture, blood (Routine X 2) w Reflex to ID Panel     Status: None (Preliminary result)   Collection Time: 02/26/17  2:55 PM  Result Value Ref Range Status   Specimen Description BLOOD RIGHT ANTECUBITAL  Final   Special Requests   Final    BOTTLES DRAWN AEROBIC AND ANAEROBIC Blood Culture adequate volume   Culture NO GROWTH 2 DAYS  Final   Report Status PENDING  Incomplete  Culture, blood (Routine X 2) w Reflex to ID Panel     Status: None (Preliminary result)   Collection Time: 02/26/17  2:59 PM  Result Value Ref Range Status   Specimen Description BLOOD RIGHT ANTECUBITAL  Final   Special Requests   Final    BOTTLES DRAWN AEROBIC AND ANAEROBIC Blood Culture adequate volume   Culture NO GROWTH 2 DAYS  Final   Report Status PENDING  Incomplete     Time coordinating discharge: 45 minutes  SIGNED:   Tawni Millers, MD  [facility] 03/01/2017, 11:39 AM Pager [phone]  If 7PM-7AM, please  contact night-coverage [email or web address] Password [email or web address]

## 2017-03-03 LAB — CULTURE, BLOOD (ROUTINE X 2)
CULTURE: NO GROWTH
Culture: NO GROWTH
SPECIAL REQUESTS: ADEQUATE
Special Requests: ADEQUATE

## 2017-03-07 ENCOUNTER — Emergency Department (HOSPITAL_COMMUNITY): Payer: Medicare HMO

## 2017-03-07 ENCOUNTER — Inpatient Hospital Stay (HOSPITAL_COMMUNITY)
Admission: EM | Admit: 2017-03-07 | Discharge: 2017-03-12 | DRG: 698 | Disposition: A | Discharge status: Skilled Nursing Facility | Payer: Medicare HMO | Attending: Internal Medicine | Admitting: Internal Medicine

## 2017-03-07 ENCOUNTER — Encounter (HOSPITAL_COMMUNITY): Payer: Self-pay | Admitting: Emergency Medicine

## 2017-03-07 DIAGNOSIS — Z833 Family history of diabetes mellitus: Secondary | ICD-10-CM | POA: Diagnosis not present

## 2017-03-07 DIAGNOSIS — G9341 Metabolic encephalopathy: Secondary | ICD-10-CM | POA: Diagnosis present

## 2017-03-07 DIAGNOSIS — N183 Chronic kidney disease, stage 3 (moderate): Secondary | ICD-10-CM | POA: Diagnosis present

## 2017-03-07 DIAGNOSIS — T83518A Infection and inflammatory reaction due to other urinary catheter, initial encounter: Secondary | ICD-10-CM | POA: Diagnosis present

## 2017-03-07 DIAGNOSIS — R339 Retention of urine, unspecified: Secondary | ICD-10-CM | POA: Diagnosis not present

## 2017-03-07 DIAGNOSIS — E1122 Type 2 diabetes mellitus with diabetic chronic kidney disease: Secondary | ICD-10-CM | POA: Diagnosis present

## 2017-03-07 DIAGNOSIS — E1121 Type 2 diabetes mellitus with diabetic nephropathy: Secondary | ICD-10-CM | POA: Diagnosis present

## 2017-03-07 DIAGNOSIS — B9620 Unspecified Escherichia coli [E. coli] as the cause of diseases classified elsewhere: Secondary | ICD-10-CM | POA: Diagnosis present

## 2017-03-07 DIAGNOSIS — Z87891 Personal history of nicotine dependence: Secondary | ICD-10-CM

## 2017-03-07 DIAGNOSIS — E1169 Type 2 diabetes mellitus with other specified complication: Secondary | ICD-10-CM | POA: Diagnosis not present

## 2017-03-07 DIAGNOSIS — E876 Hypokalemia: Secondary | ICD-10-CM | POA: Diagnosis present

## 2017-03-07 DIAGNOSIS — K219 Gastro-esophageal reflux disease without esophagitis: Secondary | ICD-10-CM | POA: Diagnosis present

## 2017-03-07 DIAGNOSIS — M48061 Spinal stenosis, lumbar region without neurogenic claudication: Secondary | ICD-10-CM | POA: Diagnosis not present

## 2017-03-07 DIAGNOSIS — G894 Chronic pain syndrome: Secondary | ICD-10-CM | POA: Diagnosis present

## 2017-03-07 DIAGNOSIS — I129 Hypertensive chronic kidney disease with stage 1 through stage 4 chronic kidney disease, or unspecified chronic kidney disease: Secondary | ICD-10-CM | POA: Diagnosis present

## 2017-03-07 DIAGNOSIS — Z4789 Encounter for other orthopedic aftercare: Secondary | ICD-10-CM | POA: Diagnosis not present

## 2017-03-07 DIAGNOSIS — Z23 Encounter for immunization: Secondary | ICD-10-CM

## 2017-03-07 DIAGNOSIS — R410 Disorientation, unspecified: Secondary | ICD-10-CM

## 2017-03-07 DIAGNOSIS — N179 Acute kidney failure, unspecified: Secondary | ICD-10-CM | POA: Diagnosis present

## 2017-03-07 DIAGNOSIS — N390 Urinary tract infection, site not specified: Secondary | ICD-10-CM | POA: Diagnosis present

## 2017-03-07 DIAGNOSIS — Z880 Allergy status to penicillin: Secondary | ICD-10-CM

## 2017-03-07 DIAGNOSIS — E1140 Type 2 diabetes mellitus with diabetic neuropathy, unspecified: Secondary | ICD-10-CM | POA: Diagnosis present

## 2017-03-07 DIAGNOSIS — E669 Obesity, unspecified: Secondary | ICD-10-CM | POA: Diagnosis not present

## 2017-03-07 DIAGNOSIS — L8990 Pressure ulcer of unspecified site, unspecified stage: Secondary | ICD-10-CM | POA: Insufficient documentation

## 2017-03-07 DIAGNOSIS — N319 Neuromuscular dysfunction of bladder, unspecified: Secondary | ICD-10-CM | POA: Diagnosis present

## 2017-03-07 DIAGNOSIS — I10 Essential (primary) hypertension: Secondary | ICD-10-CM | POA: Diagnosis not present

## 2017-03-07 DIAGNOSIS — E785 Hyperlipidemia, unspecified: Secondary | ICD-10-CM | POA: Diagnosis present

## 2017-03-07 DIAGNOSIS — Z794 Long term (current) use of insulin: Secondary | ICD-10-CM

## 2017-03-07 DIAGNOSIS — M25562 Pain in left knee: Secondary | ICD-10-CM | POA: Diagnosis present

## 2017-03-07 DIAGNOSIS — G9340 Encephalopathy, unspecified: Secondary | ICD-10-CM | POA: Diagnosis present

## 2017-03-07 DIAGNOSIS — Y846 Urinary catheterization as the cause of abnormal reaction of the patient, or of later complication, without mention of misadventure at the time of the procedure: Secondary | ICD-10-CM | POA: Diagnosis present

## 2017-03-07 DIAGNOSIS — R4182 Altered mental status, unspecified: Secondary | ICD-10-CM | POA: Diagnosis not present

## 2017-03-07 DIAGNOSIS — Z981 Arthrodesis status: Secondary | ICD-10-CM | POA: Diagnosis not present

## 2017-03-07 DIAGNOSIS — L308 Other specified dermatitis: Secondary | ICD-10-CM | POA: Diagnosis present

## 2017-03-07 LAB — COMPREHENSIVE METABOLIC PANEL
ALK PHOS: 109 U/L (ref 38–126)
ALT: 17 U/L (ref 17–63)
ANION GAP: 12 (ref 5–15)
AST: 25 U/L (ref 15–41)
Albumin: 2.8 g/dL — ABNORMAL LOW (ref 3.5–5.0)
BUN: 14 mg/dL (ref 6–20)
CALCIUM: 9.2 mg/dL (ref 8.9–10.3)
CO2: 28 mmol/L (ref 22–32)
Chloride: 102 mmol/L (ref 101–111)
Creatinine, Ser: 1.6 mg/dL — ABNORMAL HIGH (ref 0.61–1.24)
GFR calc Af Amer: 47 mL/min — ABNORMAL LOW (ref >60)
GFR calc non Af Amer: 41 mL/min — ABNORMAL LOW (ref >60)
GLUCOSE: 124 mg/dL — AB (ref 65–99)
Potassium: 3.2 mmol/L — ABNORMAL LOW (ref 3.5–5.1)
Sodium: 142 mmol/L (ref 135–145)
Total Bilirubin: 0.3 mg/dL (ref 0.3–1.2)
Total Protein: 7.1 g/dL (ref 6.5–8.1)

## 2017-03-07 LAB — CBC WITH DIFFERENTIAL/PLATELET
BASOS PCT: 0 %
Basophils Absolute: 0 10*3/uL (ref 0.0–0.1)
Eosinophils Absolute: 0.3 10*3/uL (ref 0.0–0.7)
Eosinophils Relative: 3 %
HEMATOCRIT: 30.1 % — AB (ref 39.0–52.0)
Hemoglobin: 9.7 g/dL — ABNORMAL LOW (ref 13.0–17.0)
LYMPHS PCT: 12 %
Lymphs Abs: 1.4 10*3/uL (ref 0.7–4.0)
MCH: 28.7 pg (ref 26.0–34.0)
MCHC: 32.2 g/dL (ref 30.0–36.0)
MCV: 89.1 fL (ref 78.0–100.0)
MONO ABS: 1.2 10*3/uL — AB (ref 0.1–1.0)
Monocytes Relative: 10 %
NEUTROS ABS: 8.8 10*3/uL — AB (ref 1.7–7.7)
Neutrophils Relative %: 75 %
Platelets: 536 10*3/uL — ABNORMAL HIGH (ref 150–400)
RBC: 3.38 MIL/uL — ABNORMAL LOW (ref 4.22–5.81)
RDW: 13.8 % (ref 11.5–15.5)
WBC: 11.8 10*3/uL — ABNORMAL HIGH (ref 4.0–10.5)

## 2017-03-07 LAB — URINALYSIS, ROUTINE W REFLEX MICROSCOPIC

## 2017-03-07 LAB — URINALYSIS, MICROSCOPIC (REFLEX): Bacteria, UA: NONE SEEN

## 2017-03-07 LAB — CBG MONITORING, ED: Glucose-Capillary: 103 mg/dL — ABNORMAL HIGH (ref 65–99)

## 2017-03-07 LAB — I-STAT CG4 LACTIC ACID, ED
Lactic Acid, Venous: 1.25 mmol/L (ref 0.5–1.9)
Lactic Acid, Venous: 0.87 mmol/L (ref 0.5–1.9)

## 2017-03-07 MED ORDER — CEFTRIAXONE SODIUM 1 G IJ SOLR
1.0000 g | Freq: Every day | INTRAMUSCULAR | Status: DC
  Administered 2017-03-08 – 2017-03-10 (×4): 1 g via INTRAVENOUS
  Filled 2017-03-07 (×5): qty 10

## 2017-03-07 MED ORDER — OXYCODONE HCL 5 MG PO TABS
5.0000 mg | ORAL_TABLET | ORAL | Status: DC | PRN
  Administered 2017-03-08 – 2017-03-10 (×2): 5 mg via ORAL
  Filled 2017-03-07 (×3): qty 1

## 2017-03-07 MED ORDER — METHOCARBAMOL 500 MG PO TABS
750.0000 mg | ORAL_TABLET | Freq: Three times a day (TID) | ORAL | Status: DC
  Administered 2017-03-07 – 2017-03-11 (×13): 750 mg via ORAL
  Filled 2017-03-07 (×14): qty 2

## 2017-03-07 MED ORDER — SODIUM CHLORIDE 0.9 % IV SOLN
Freq: Once | INTRAVENOUS | Status: AC
  Administered 2017-03-07: 18:00:00 via INTRAVENOUS

## 2017-03-07 NOTE — ED Notes (Signed)
MD at bedside. 

## 2017-03-07 NOTE — ED Provider Notes (Signed)
[facility] DEPT Provider Note   CSN: [id number] Arrival date & time: 03/07/17  1512     History   Chief Complaint Chief Complaint  Patient presents with  . Altered Mental Status    HPI Donald Calderon is a 76 y.o. male.  The history is provided by the patient. No language interpreter was used.    Donald Calderon is a 76 y.o. male who presents to the Emergency Department complaining of AMS.  Level V caveat due to confusion.  Hx obtained from [facility] staff.  Around 9 or 10 pm last night the patient complained of chills.  Today he had worsening confusion, intermittently unable to speak, diaphoretic at times.  Intermittently he could no hold items. Tremulous intermittently.  He states that he has been feeling poorly since recently admitted for urinary tract infection. He states that he is unable to walk. He does report left knee pain, no history of falls. Per mar he received gentamycin IM at the nursing facility today (once daily for the last three days)  Past Medical History:  Diagnosis Date  . ALCOHOL ABUSE   . Anxiety   . Arthritis   . Depression   . Diabetes mellitus   . GERD (gastroesophageal reflux disease)   . Hypertension   . Pneumonia     Patient Active Problem List   Diagnosis Date Noted  . E coli UTI (urinary tract infection) 03/07/2017  . Acute metabolic encephalopathy 09/32/3557  . Encephalopathy 03/07/2017  . Normocytic anemia 02/23/2017  . Neurogenic bladder 02/23/2017  . AKI (acute kidney injury) ([place]) 02/23/2017  . Sepsis secondary to UTI ([place]) 02/22/2017  . Postoperative ileus ([place]) 02/12/2017  . Urine retention 01/29/2017  . 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 Lumbar stenosis 01/26/2017  . Diabetes mellitus type 2 in obese ([place])   . Benign essential HTN   . ETOH abuse   . Chronic pain syndrome   . Hyponatremia   . Leukocytosis   . Acute blood loss anemia   . Epidural hematoma ([place]) 01/23/2017  . Status post lumbar surgery 01/21/2017  . Status post  lumbar spine operation 01/21/2017  . Chronic back pain 09/27/2011  . NEUROPATHY 08/08/2010  . SWELLING MASS OR LUMP IN HEAD AND NECK 06/27/2010  . BENIGN PROSTATIC HYPERTROPHY, WITH URINARY OBSTRUCTION 06/19/2010  . 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  . HYPOKALEMIA 03/18/2010  . 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 Diabetes ([place]) 02/07/2010  . HYPERLIPIDEMIA 02/07/2010  . ALCOHOL ABUSE 02/07/2010  . HYPERTENSION 02/07/2010  . 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 BACK SURGERY      disc fro picched nerve times 2  . EYE SURGERY Right    Gaucoma  . LUMBAR LAMINECTOMY/DECOMPRESSION MICRODISCECTOMY N/A 01/21/2017   Procedure: Decompression Lumbar 2-3;  Surgeon: Melina Schools, MD;  Location: [facility];  Service: Orthopedics;  Laterality: N/A;  210 mins  . LUMBAR LAMINECTOMY/DECOMPRESSION MICRODISCECTOMY N/A 01/23/2017   Procedure: EXPLORATION AND EVACUATION OF HEMATOMA L2-L3;  Surgeon: Melina Schools, MD;  Location: [facility];  Service: Orthopedics;  Laterality: N/A;  . PROSTATE SURGERY         Home Medications    Prior to Admission medications   Medication Sig Start Date End Date Taking? Authorizing Provider  acetaminophen (TYLENOL) 325 MG tablet Take 1-2 tablets (325-650 mg total) by mouth every 4 (four) hours as needed for mild pain. 02/11/17  Yes Love, Ivan Anchors, PA-C  amLODipine (NORVASC) 10 MG  tablet Take 1 tablet (10 mg total) by mouth daily. 02/12/17  Yes Love, Ivan Anchors, PA-C  bethanechol (URECHOLINE) 25 MG tablet Take 1 tablet (25 mg total) by mouth 3 (three) times daily. 02/12/17  Yes Love, Ivan Anchors, PA-C  bisacodyl (DULCOLAX) 5 MG EC tablet Take 1 tablet (5 mg total) by mouth daily as needed for moderate constipation. 03/01/17 03/01/18 Yes Arrien, Jimmy Picket, MD  cyclobenzaprine (FLEXERIL) 5 MG tablet Take 1 tablet (5 mg total) by mouth 3 (three) times daily as needed for muscle spasms. 02/12/17  Yes Love, Ivan Anchors, PA-C    gabapentin (NEURONTIN) 100 MG capsule Take 1 capsule (100 mg total) by mouth 3 (three) times daily. 02/12/17  Yes Love, Ivan Anchors, PA-C  HYDROcodone-acetaminophen (NORCO) 10-325 MG tablet Take 1 tablet by mouth every 12 (twelve) hours as needed for severe pain. Patient taking differently: Take 0.5-1 tablets by mouth every 12 (twelve) hours as needed for severe pain.  03/01/17  Yes Arrien, Jimmy Picket, MD  hydrocortisone (ANUSOL-HC) 2.5 % rectal cream Place rectally 4 (four) times daily. Patient taking differently: Place 1 application rectally as needed for hemorrhoids.  02/12/17  Yes Love, Ivan Anchors, PA-C  LEVEMIR 100 UNIT/ML injection Inject 0.1-0.3 mLs (10-30 Units total) into the skin 2 (two) times daily. 10 units every morning, and 34 units every evening 02/12/17  Yes Love, Ivan Anchors, PA-C  lidocaine (XYLOCAINE) 2 % jelly Apply 1 application topically as needed. Use with in and out catheter 02/18/17  Yes [provider]  lisinopril (PRINIVIL,ZESTRIL) 20 MG tablet Take 20 mg by mouth daily. 02/12/17  Yes [provider]  polyethylene glycol (MIRALAX / GLYCOLAX) packet Take 17 g by mouth 3 (three) times daily with meals. 02/12/17  Yes Love, Ivan Anchors, PA-C  pravastatin (PRAVACHOL) 10 MG tablet Take 1 tablet (10 mg total) by mouth every evening. 02/12/17  Yes Love, Ivan Anchors, PA-C  tamsulosin (FLOMAX) 0.4 MG CAPS capsule Take 1 capsule (0.4 mg total) by mouth 2 (two) times daily. 02/12/17  Yes Love, Ivan Anchors, PA-C  traMADol (ULTRAM) 50 MG tablet Take 50 mg by mouth every 6 (six) hours as needed for moderate pain. 02/12/17  Yes [provider]  Calcium Carbonate Antacid (ALKA-SELTZER ANTACID PO) Take 1 tablet by mouth as needed (heartburn).    [provider]  diclofenac sodium (VOLTAREN) 1 % GEL Apply 2 g topically 4 (four) times daily. 03/01/17   Arrien, Jimmy Picket, MD  insulin aspart (NOVOLOG) 100 UNIT/ML injection Inject 4-6 Units into the skin 3 (three) times daily  before meals. Per sliding scale . If CBG>150 give 4 units, if CBG >200 give 6 units.    [provider]  pantoprazole (PROTONIX) 40 MG tablet Take 1 tablet (40 mg total) by mouth daily. 03/01/17 03/31/17  Arrien, Jimmy Picket, MD    Family History Family History  Problem Relation Age of Onset  . Diabetes Mother   . Diabetes Father     Social History Social History  Substance Use Topics  . Smoking status: Former Smoker    Packs/day: 1.00    Years: 39.00    Types: Cigarettes    Quit date: 2000  . Smokeless tobacco: Former Systems developer    Types: Chew    Quit date: 09/26/1997  . Alcohol use 2.4 oz/week    4 Cans of beer per week     Comment: 4 cans of beer per day     Allergies   Penicillins  Review of Systems Review of Systems  All other systems reviewed and are negative.    Physical Exam Updated Vital Signs BP (!) 145/74   Pulse 99   Temp 98.1 F (36.7 C) (Rectal)   Resp (!) 23   Ht 5\' 11"  (1.803 m)   Wt 94.3 kg (208 lb)   SpO2 98%   BMI 29.01 kg/m   Physical Exam  Constitutional: He is oriented to person, place, and time. He appears well-developed and well-nourished.  HENT:  Head: Normocephalic and atraumatic.  Cardiovascular: Normal rate and regular rhythm.   No murmur heard. Pulmonary/Chest: Effort normal and breath sounds normal. No respiratory distress.  Abdominal: Soft. There is no tenderness. There is no rebound and no guarding.  Musculoskeletal:  1+ pitting edema to bilateral lower extremities. There is pain with flexion and extension of the left knee. There is no appreciable bony tenderness to the left knee. No erythema to the knee.  Neurological: He is alert and oriented to person, place, and time.  Mildly tremulous. Generalized weakness but greatest in the left lower extremity.  Skin: Skin is warm and dry.  Psychiatric: He has a normal mood and affect. His behavior is normal.  Nursing note and vitals reviewed.    ED Treatments / Results   Labs (all labs ordered are listed, but only abnormal results are displayed) Labs Reviewed  COMPREHENSIVE METABOLIC PANEL - Abnormal; Notable for the following:       Result Value   Potassium 3.2 (*)    Glucose, Bld 124 (*)    Creatinine, Ser 1.60 (*)    Albumin 2.8 (*)    GFR calc non Af Amer 41 (*)    GFR calc Af Amer 47 (*)    All other components within normal limits  CBC WITH DIFFERENTIAL/PLATELET - Abnormal; Notable for the following:    WBC 11.8 (*)    RBC 3.38 (*)    Hemoglobin 9.7 (*)    HCT 30.1 (*)    Platelets 536 (*)    Neutro Abs 8.8 (*)    Monocytes Absolute 1.2 (*)    All other components within normal limits  URINALYSIS, ROUTINE W REFLEX MICROSCOPIC - Abnormal; Notable for the following:    Color, Urine RED (*)    APPearance TURBID (*)    Glucose, UA   (*)    Value: TEST NOT REPORTED DUE TO COLOR INTERFERENCE OF URINE PIGMENT   Hgb urine dipstick   (*)    Value: TEST NOT REPORTED DUE TO COLOR INTERFERENCE OF URINE PIGMENT   Bilirubin Urine   (*)    Value: TEST NOT REPORTED DUE TO COLOR INTERFERENCE OF URINE PIGMENT   Ketones, ur   (*)    Value: TEST NOT REPORTED DUE TO COLOR INTERFERENCE OF URINE PIGMENT   Protein, ur   (*)    Value: TEST NOT REPORTED DUE TO COLOR INTERFERENCE OF URINE PIGMENT   Nitrite   (*)    Value: TEST NOT REPORTED DUE TO COLOR INTERFERENCE OF URINE PIGMENT   Leukocytes, UA   (*)    Value: TEST NOT REPORTED DUE TO COLOR INTERFERENCE OF URINE PIGMENT   All other components within normal limits  URINALYSIS, MICROSCOPIC (REFLEX) - Abnormal; Notable for the following:    Squamous Epithelial / LPF 0-5 (*)    Non Squamous Epithelial PRESENT (*)    All other components within normal limits  CBG MONITORING, ED - Abnormal; Notable for the following:  Glucose-Capillary 103 (*)    All other components within normal limits  CULTURE, BLOOD (ROUTINE X 2)  CULTURE, BLOOD (ROUTINE X 2)  URINE CULTURE  URINE CULTURE  URINALYSIS, ROUTINE W  REFLEX MICROSCOPIC  I-STAT CG4 LACTIC ACID, ED  I-STAT TROPONIN, ED  I-STAT CG4 LACTIC ACID, ED    EKG  EKG Interpretation  Date/Time:  Sunday March 07 2017 15:37:00 EDT Ventricular Rate:  78 PR Interval:    QRS Duration: 97 QT Interval:  383 QTC Calculation: 437 R Axis:   11 Text Interpretation:  Sinus rhythm Confirmed by Quintella Reichert [phone]) on 03/07/2017 4:08:36 PM       Radiology Dg Chest 2 View  Result Date: 03/07/2017 CLINICAL DATA:  Altered mental status and fever past 12 hours. EXAM: CHEST  2 VIEW COMPARISON:  02/22/2017 and 04/11/2010 FINDINGS: Lungs are adequately inflated without airspace consolidation or effusion. Mild stable cardiomegaly. Minimal calcified plaque over the thoracic aorta. There mild degenerate changes of the spine. IMPRESSION: No acute cardiopulmonary disease. Mild stable cardiomegaly. Aortic Atherosclerosis (ICD10-I70.0). Electronically Signed   By: Marin Olp M.D.   On: 03/07/2017 16:46   Ct Head Wo Contrast  Result Date: 03/07/2017 CLINICAL DATA:  Altered mental status and fever. EXAM: CT HEAD WITHOUT CONTRAST TECHNIQUE: Contiguous axial images were obtained from the base of the skull through the vertex without intravenous contrast. COMPARISON:  02/11/2010 FINDINGS: Brain: Ventricles and cisterns are within normal. There is mild chronic ischemic microvascular disease present. Encephalomalacia over the left temporal region likely postsurgical. No mass, mass effect, shift midline structures or acute hemorrhage. No evidence of acute infarction. Vascular: No hyperdense vessel or unexpected calcification. Skull: Previous left temporal craniotomy.  No acute fracture. Sinuses/Orbits: No acute finding. Other: None. IMPRESSION: No acute intracranial findings. Mild chronic ischemic microvascular disease. Postsurgical change over the left temporal region. Electronically Signed   By: Marin Olp M.D.   On: 03/07/2017 16:38   Dg Knee Complete 4 Views  Left  Result Date: 03/07/2017 CLINICAL DATA:  Left anterior knee pain when moving. EXAM: LEFT KNEE - COMPLETE 4+ VIEW COMPARISON:  None. FINDINGS: Osteopenia. Moderate tricompartmental osteoarthritic change worse over the medial compartment. Small joint effusion. No acute fracture or dislocation. IMPRESSION: No acute findings. 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 Small joint effusion. Electronically Signed   By: Marin Olp M.D.   On: 03/07/2017 16:47    Procedures Procedures (including critical care time)  Medications Ordered in ED Medications  methocarbamol (ROBAXIN) tablet 750 mg (750 mg Oral Given 03/07/17 2201)  oxyCODONE (Oxy IR/ROXICODONE) immediate release tablet 5 mg (not administered)  cefTRIAXone (ROCEPHIN) 1 g in dextrose 5 % 50 mL IVPB (not administered)  0.9 %  sodium chloride infusion ( Intravenous New Bag/Given 03/07/17 1758)     Initial Impression / Assessment and Plan / ED Course  I have reviewed the triage vital signs and the nursing notes.  Pertinent labs & imaging results that were available during my care of the patient were reviewed by me and considered in my medical decision making (see chart for details).     Patient here with change in mental status since last night, recently admitted for urinary retention and UTI. He is receiving gentamicin at his nursing facility, last status today. He is confused but nonfocal on examination.labs are similar compared to priors. No current evidence of sepsis. Hospitalist consulted for admission for altered mental status, likely delirium.  Final Clinical Impressions(s) / ED Diagnoses   Final  diagnoses:  Delirium    New Prescriptions Current Discharge Medication List       Quintella Reichert, MD 03/07/17 2356

## 2017-03-07 NOTE — ED Notes (Signed)
ED TO INPATIENT HANDOFF REPORT  Name/Age/Gender Donald Calderon 76 y.o. male  Code Status Code Status History    Date Active Date Inactive Code Status Order ID Comments User Context   02/22/2017  8:49 PM 03/01/2017  7:00 PM Full Code [id number]  Norval Morton, MD ED   01/26/2017  5:43 PM 02/12/2017  2:32 PM Full Code [id number]  Bary Leriche, PA-C Inpatient   01/22/2017  8:07 AM 01/26/2017  5:39 PM Full Code [id number]  Valinda Hoar, PA-C Inpatient   09/27/2011  5:20 PM 09/29/2011  6:47 PM Full Code [id number]  Derrell Lolling, RN ED      Home/SNF/Other Nursing Home  Chief Complaint Fever  Level of Care/Admitting Diagnosis ED Disposition    ED Disposition Condition [facility] Area: [facility] [[id number]]  Level of Care: Telemetry [5]  Admit to tele based on following criteria: Other see comments  Comments: possible sepsis  Diagnosis: Encephalopathy [998338]  Admitting Physician: Morrison Old  Attending Physician: Morrison Old  Estimated length of stay: 3 - 4 days  Certification:: I certify this patient will need inpatient services for at least 2 midnights  Bed request comments: tele  PT Class (Do Not Modify): Inpatient [101]  PT Acc Code (Do Not Modify): Private [1]       Medical History Past Medical History:  Diagnosis Date  . ALCOHOL ABUSE   . Anxiety   . Arthritis   . Depression   . Diabetes mellitus   . GERD (gastroesophageal reflux disease)   . Hypertension   . Pneumonia     Allergies Allergies  Allergen Reactions  . Penicillins Itching and Rash    Has patient had a PCN reaction causing immediate rash, facial/tongue/throat swelling, SOB or lightheadedness with hypotension: No Has patient had a PCN reaction causing severe rash involving mucus membranes or skin necrosis: No Has patient had a PCN reaction that required hospitalization: No Has patient had a PCN reaction occurring within  the last 10 years: No If all of the above answers are "NO", then may proceed with Cephalosporin use.  Tolerated Ancef     IV Location/Drains/Wounds Patient Lines/Drains/Airways Status   Active Line/Drains/Airways    Name:   Placement date:   Placement time:   Site:   Days:   Peripheral IV 03/07/17 Right Antecubital  03/07/17    1606    Antecubital    less than 1   Incision (Closed) 01/23/17 Back  01/23/17    1400      43          Labs/Imaging Results for orders placed or performed during the hospital encounter of 03/07/17 (from the past 48 hour(s))  Comprehensive metabolic panel     Status: Abnormal   Collection Time: 03/07/17  4:02 PM  Result Value Ref Range   Sodium 142 135 - 145 mmol/L   Potassium 3.2 (L) 3.5 - 5.1 mmol/L   Chloride 102 101 - 111 mmol/L   CO2 28 22 - 32 mmol/L   Glucose, Bld 124 (H) 65 - 99 mg/dL   BUN 14 6 - 20 mg/dL   Creatinine, Ser 1.60 (H) 0.61 - 1.24 mg/dL   Calcium 9.2 8.9 - 10.3 mg/dL   Total Protein 7.1 6.5 - 8.1 g/dL   Albumin 2.8 (L) 3.5 - 5.0 g/dL   AST 25 15 - 41 U/L   ALT 17 17 - 63 U/L   Alkaline Phosphatase  109 38 - 126 U/L   Total Bilirubin 0.3 0.3 - 1.2 mg/dL   GFR calc non Af Amer 41 (L) >60 mL/min   GFR calc Af Amer 47 (L) >60 mL/min    Comment: (NOTE) The eGFR has been calculated using the CKD EPI equation. This calculation has not been validated in all clinical situations. eGFR's persistently <60 mL/min signify possible Chronic Kidney Disease.    Anion gap 12 5 - 15  CBC WITH DIFFERENTIAL     Status: Abnormal   Collection Time: 03/07/17  4:02 PM  Result Value Ref Range   WBC 11.8 (H) 4.0 - 10.5 K/uL   RBC 3.38 (L) 4.22 - 5.81 MIL/uL   Hemoglobin 9.7 (L) 13.0 - 17.0 g/dL   HCT 05.8 (L) 64.1 - 05.1 %   MCV 89.1 78.0 - 100.0 fL   MCH 28.7 26.0 - 34.0 pg   MCHC 32.2 30.0 - 36.0 g/dL   RDW 71.0 78.5 - 02.0 %   Platelets 536 (H) 150 - 400 K/uL   Neutrophils Relative % 75 %   Neutro Abs 8.8 (H) 1.7 - 7.7 K/uL   Lymphocytes  Relative 12 %   Lymphs Abs 1.4 0.7 - 4.0 K/uL   Monocytes Relative 10 %   Monocytes Absolute 1.2 (H) 0.1 - 1.0 K/uL   Eosinophils Relative 3 %   Eosinophils Absolute 0.3 0.0 - 0.7 K/uL   Basophils Relative 0 %   Basophils Absolute 0.0 0.0 - 0.1 K/uL  I-Stat CG4 Lactic Acid, ED  (not at  [facility])     Status: None   Collection Time: 03/07/17  4:14 PM  Result Value Ref Range   Lactic Acid, Venous 1.25 0.5 - 1.9 mmol/L  CBG monitoring, ED     Status: Abnormal   Collection Time: 03/07/17  4:49 PM  Result Value Ref Range   Glucose-Capillary 103 (H) 65 - 99 mg/dL  Urinalysis, Routine w reflex microscopic     Status: Abnormal   Collection Time: 03/07/17  5:44 PM  Result Value Ref Range   Color, Urine RED (A) YELLOW    Comment: BIOCHEMICALS MAY BE AFFECTED BY COLOR   APPearance TURBID (A) CLEAR   Specific Gravity, Urine  1.005 - 1.030    TEST NOT REPORTED DUE TO COLOR INTERFERENCE OF URINE PIGMENT   pH  5.0 - 8.0    TEST NOT REPORTED DUE TO COLOR INTERFERENCE OF URINE PIGMENT   Glucose, UA (A) NEGATIVE mg/dL    TEST NOT REPORTED DUE TO COLOR INTERFERENCE OF URINE PIGMENT   Hgb urine dipstick (A) NEGATIVE    TEST NOT REPORTED DUE TO COLOR INTERFERENCE OF URINE PIGMENT   Bilirubin Urine (A) NEGATIVE    TEST NOT REPORTED DUE TO COLOR INTERFERENCE OF URINE PIGMENT   Ketones, ur (A) NEGATIVE mg/dL    TEST NOT REPORTED DUE TO COLOR INTERFERENCE OF URINE PIGMENT   Protein, ur (A) NEGATIVE mg/dL    TEST NOT REPORTED DUE TO COLOR INTERFERENCE OF URINE PIGMENT   Nitrite (A) NEGATIVE    TEST NOT REPORTED DUE TO COLOR INTERFERENCE OF URINE PIGMENT   Leukocytes, UA (A) NEGATIVE    TEST NOT REPORTED DUE TO COLOR INTERFERENCE OF URINE PIGMENT  Urinalysis, Microscopic (reflex)     Status: Abnormal   Collection Time: 03/07/17  5:44 PM  Result Value Ref Range   RBC / HPF TOO NUMEROUS TO COUNT 0 - 5 RBC/hpf   WBC, UA 6-30 0 - 5 WBC/hpf  Bacteria, UA NONE SEEN NONE SEEN   Squamous Epithelial / LPF 0-5  (A) NONE SEEN   Non Squamous Epithelial PRESENT (A) NONE SEEN   Amorphous Crystal PRESENT   I-Stat CG4 Lactic Acid, ED  (not at  [facility])     Status: None   Collection Time: 03/07/17  7:31 PM  Result Value Ref Range   Lactic Acid, Venous 0.87 0.5 - 1.9 mmol/L   Dg Chest 2 View  Result Date: 03/07/2017 CLINICAL DATA:  Altered mental status and fever past 12 hours. EXAM: CHEST  2 VIEW COMPARISON:  02/22/2017 and 04/11/2010 FINDINGS: Lungs are adequately inflated without airspace consolidation or effusion. Mild stable cardiomegaly. Minimal calcified plaque over the thoracic aorta. There mild degenerate changes of the spine. IMPRESSION: No acute cardiopulmonary disease. Mild stable cardiomegaly. Aortic Atherosclerosis (ICD10-I70.0). Electronically Signed   By: Marin Olp M.D.   On: 03/07/2017 16:46   Ct Head Wo Contrast  Result Date: 03/07/2017 CLINICAL DATA:  Altered mental status and fever. EXAM: CT HEAD WITHOUT CONTRAST TECHNIQUE: Contiguous axial images were obtained from the base of the skull through the vertex without intravenous contrast. COMPARISON:  02/11/2010 FINDINGS: Brain: Ventricles and cisterns are within normal. There is mild chronic ischemic microvascular disease present. Encephalomalacia over the left temporal region likely postsurgical. No mass, mass effect, shift midline structures or acute hemorrhage. No evidence of acute infarction. Vascular: No hyperdense vessel or unexpected calcification. Skull: Previous left temporal craniotomy.  No acute fracture. Sinuses/Orbits: No acute finding. Other: None. IMPRESSION: No acute intracranial findings. Mild chronic ischemic microvascular disease. Postsurgical change over the left temporal region. Electronically Signed   By: Marin Olp M.D.   On: 03/07/2017 16:38   Dg Knee Complete 4 Views Left  Result Date: 03/07/2017 CLINICAL DATA:  Left anterior knee pain when moving. EXAM: LEFT KNEE - COMPLETE 4+ VIEW COMPARISON:  None. FINDINGS:  Osteopenia. Moderate tricompartmental osteoarthritic change worse over the medial compartment. Small joint effusion. No acute fracture or dislocation. IMPRESSION: No acute findings. 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 Small joint effusion. Electronically Signed   By: Marin Olp M.D.   On: 03/07/2017 16:47    Pending Labs [employer]    Start     Ordered   03/07/17 2301  Urine Culture  Once,   R    Question:  Patient immune status  Answer:  Normal   03/07/17 2301   03/07/17 2300  Urinalysis, Routine w reflex microscopic  Once,   R     03/07/17 2301   03/07/17 1556  Urine culture  STAT,   STAT     03/07/17 1555   03/07/17 1555  Blood Culture (routine x 2)  BLOOD CULTURE X 2,   STAT     03/07/17 1555   Signed and Held  Basic metabolic panel  Tomorrow morning,   R     Signed and Held   Signed and Held  CBC  Tomorrow morning,   R     Signed and Held      Vitals/Pain Today's Vitals   03/07/17 2130 03/07/17 2200 03/07/17 2230 03/07/17 2300  BP: (!) 135/92 (!) 148/78 (!) 169/89 (!) 145/74  Pulse: 92 91 97 99  Resp: '15 20 15 '$ (!) 23  Temp:      TempSrc:      SpO2: 99% 96% 97% 98%  Weight:      Height:      PainSc:  Isolation Precautions No active isolations  Medications Medications  methocarbamol (ROBAXIN) tablet 750 mg (750 mg Oral Given 03/07/17 2201)  oxyCODONE (Oxy IR/ROXICODONE) immediate release tablet 5 mg (not administered)  cefTRIAXone (ROCEPHIN) 1 g in dextrose 5 % 50 mL IVPB (not administered)  0.9 %  sodium chloride infusion ( Intravenous New Bag/Given 03/07/17 1758)    Mobility non-ambulatory

## 2017-03-07 NOTE — ED Notes (Signed)
Patient transported to CT 

## 2017-03-07 NOTE — ED Notes (Signed)
Patient given sandwich and ginger ale 

## 2017-03-07 NOTE — ED Triage Notes (Signed)
Per [facility] pt from [employer] [facility], staff called out stating pt has had confusion and spiking a fever for past 12 hours. Staff states pt had temp of 99.1 this am, unsure if treated with medicine. Pt c/o left leg pain when moving. Pt orientated to self. Pt has foley. CBG 126

## 2017-03-08 ENCOUNTER — Inpatient Hospital Stay (HOSPITAL_COMMUNITY): Payer: Medicare HMO

## 2017-03-08 LAB — BASIC METABOLIC PANEL
ANION GAP: 10 (ref 5–15)
BUN: 15 mg/dL (ref 6–20)
CALCIUM: 9 mg/dL (ref 8.9–10.3)
CO2: 31 mmol/L (ref 22–32)
Chloride: 102 mmol/L (ref 101–111)
Creatinine, Ser: 1.51 mg/dL — ABNORMAL HIGH (ref 0.61–1.24)
GFR calc Af Amer: 50 mL/min — ABNORMAL LOW (ref >60)
GFR, EST NON AFRICAN AMERICAN: 43 mL/min — AB (ref >60)
Glucose, Bld: 154 mg/dL — ABNORMAL HIGH (ref 65–99)
POTASSIUM: 3 mmol/L — AB (ref 3.5–5.1)
Sodium: 143 mmol/L (ref 135–145)

## 2017-03-08 LAB — CBC
HCT: 28.8 % — ABNORMAL LOW (ref 39.0–52.0)
HEMOGLOBIN: 9.3 g/dL — AB (ref 13.0–17.0)
MCH: 28.8 pg (ref 26.0–34.0)
MCHC: 32.3 g/dL (ref 30.0–36.0)
MCV: 89.2 fL (ref 78.0–100.0)
Platelets: 540 10*3/uL — ABNORMAL HIGH (ref 150–400)
RBC: 3.23 MIL/uL — AB (ref 4.22–5.81)
RDW: 13.9 % (ref 11.5–15.5)
WBC: 11 10*3/uL — ABNORMAL HIGH (ref 4.0–10.5)

## 2017-03-08 LAB — URINALYSIS, ROUTINE W REFLEX MICROSCOPIC
Bilirubin Urine: NEGATIVE
GLUCOSE, UA: NEGATIVE mg/dL
KETONES UR: NEGATIVE mg/dL
Nitrite: NEGATIVE
PH: 5 (ref 5.0–8.0)
PROTEIN: 30 mg/dL — AB
Specific Gravity, Urine: 1.006 (ref 1.005–1.030)
Squamous Epithelial / LPF: NONE SEEN

## 2017-03-08 LAB — MRSA PCR SCREENING: MRSA BY PCR: NEGATIVE

## 2017-03-08 LAB — GLUCOSE, CAPILLARY
GLUCOSE-CAPILLARY: 150 mg/dL — AB (ref 65–99)
GLUCOSE-CAPILLARY: 186 mg/dL — AB (ref 65–99)
GLUCOSE-CAPILLARY: 277 mg/dL — AB (ref 65–99)
Glucose-Capillary: 232 mg/dL — ABNORMAL HIGH (ref 65–99)
Glucose-Capillary: 242 mg/dL — ABNORMAL HIGH (ref 65–99)

## 2017-03-08 MED ORDER — SODIUM CHLORIDE 0.9% FLUSH
3.0000 mL | Freq: Two times a day (BID) | INTRAVENOUS | Status: DC
  Administered 2017-03-08 – 2017-03-10 (×6): 3 mL via INTRAVENOUS

## 2017-03-08 MED ORDER — BISACODYL 5 MG PO TBEC: 5.0000 mg | DELAYED_RELEASE_TABLET | Freq: Every day | ORAL | Status: DC | PRN

## 2017-03-08 MED ORDER — HEPARIN SODIUM (PORCINE) 5000 UNIT/ML IJ SOLN
5000.0000 [IU] | Freq: Three times a day (TID) | INTRAMUSCULAR | Status: DC
  Administered 2017-03-08 – 2017-03-12 (×13): 5000 [IU] via SUBCUTANEOUS
  Filled 2017-03-08 (×12): qty 1

## 2017-03-08 MED ORDER — SENNA 8.6 MG PO TABS
1.0000 | ORAL_TABLET | Freq: Two times a day (BID) | ORAL | Status: DC
  Administered 2017-03-08 – 2017-03-09 (×4): 8.6 mg via ORAL
  Filled 2017-03-08 (×4): qty 1

## 2017-03-08 MED ORDER — LISINOPRIL 20 MG PO TABS
20.0000 mg | ORAL_TABLET | Freq: Every day | ORAL | Status: DC
  Administered 2017-03-08 – 2017-03-11 (×4): 20 mg via ORAL
  Filled 2017-03-08 (×4): qty 1

## 2017-03-08 MED ORDER — GABAPENTIN 100 MG PO CAPS
100.0000 mg | ORAL_CAPSULE | Freq: Three times a day (TID) | ORAL | Status: DC
  Administered 2017-03-08 – 2017-03-11 (×12): 100 mg via ORAL
  Filled 2017-03-08 (×12): qty 1

## 2017-03-08 MED ORDER — ONDANSETRON HCL 4 MG PO TABS: 4.0000 mg | ORAL_TABLET | Freq: Four times a day (QID) | ORAL | Status: DC | PRN

## 2017-03-08 MED ORDER — AMLODIPINE BESYLATE 10 MG PO TABS
10.0000 mg | ORAL_TABLET | Freq: Every day | ORAL | Status: DC
  Administered 2017-03-08 – 2017-03-11 (×4): 10 mg via ORAL
  Filled 2017-03-08 (×4): qty 1

## 2017-03-08 MED ORDER — ACETAMINOPHEN 325 MG PO TABS: 325.0000 mg | ORAL_TABLET | ORAL | Status: DC | PRN

## 2017-03-08 MED ORDER — HYDROCODONE-ACETAMINOPHEN 10-325 MG PO TABS: 1.0000 | ORAL_TABLET | Freq: Two times a day (BID) | ORAL | Status: DC | PRN

## 2017-03-08 MED ORDER — TRAMADOL HCL 50 MG PO TABS
50.0000 mg | ORAL_TABLET | Freq: Four times a day (QID) | ORAL | Status: DC | PRN
  Administered 2017-03-09 (×2): 50 mg via ORAL
  Filled 2017-03-08 (×2): qty 1

## 2017-03-08 MED ORDER — ALBUTEROL SULFATE (2.5 MG/3ML) 0.083% IN NEBU: 2.5000 mg | INHALATION_SOLUTION | RESPIRATORY_TRACT | Status: DC | PRN

## 2017-03-08 MED ORDER — TECHNETIUM TC 99M DIETHYLENETRIAME-PENTAACETIC ACID
31.7000 | Freq: Once | INTRAVENOUS | Status: AC | PRN
  Administered 2017-03-08: 31.7 via RESPIRATORY_TRACT

## 2017-03-08 MED ORDER — SODIUM CHLORIDE 0.9% FLUSH: 3.0000 mL | INTRAVENOUS | Status: DC | PRN

## 2017-03-08 MED ORDER — SODIUM CHLORIDE 0.9 % IV SOLN
250.0000 mL | INTRAVENOUS | Status: DC | PRN
  Administered 2017-03-08: 250 mL via INTRAVENOUS

## 2017-03-08 MED ORDER — HYDRALAZINE HCL 20 MG/ML IJ SOLN: 10.0000 mg | Freq: Four times a day (QID) | INTRAMUSCULAR | Status: DC | PRN

## 2017-03-08 MED ORDER — DICLOFENAC SODIUM 1 % TD GEL
2.0000 g | Freq: Four times a day (QID) | TRANSDERMAL | Status: DC
  Administered 2017-03-08 – 2017-03-11 (×15): 2 g via TOPICAL
  Filled 2017-03-08: qty 100

## 2017-03-08 MED ORDER — AZITHROMYCIN 250 MG PO TABS
250.0000 mg | ORAL_TABLET | Freq: Every day | ORAL | Status: DC
  Administered 2017-03-09 – 2017-03-11 (×3): 250 mg via ORAL
  Filled 2017-03-08 (×3): qty 1

## 2017-03-08 MED ORDER — PRAVASTATIN SODIUM 20 MG PO TABS
10.0000 mg | ORAL_TABLET | Freq: Every evening | ORAL | Status: DC
  Administered 2017-03-08 – 2017-03-11 (×4): 10 mg via ORAL
  Filled 2017-03-08 (×4): qty 1

## 2017-03-08 MED ORDER — POTASSIUM CHLORIDE CRYS ER 20 MEQ PO TBCR
40.0000 meq | EXTENDED_RELEASE_TABLET | Freq: Two times a day (BID) | ORAL | Status: AC
  Administered 2017-03-08 (×2): 40 meq via ORAL
  Filled 2017-03-08 (×2): qty 2

## 2017-03-08 MED ORDER — BETHANECHOL CHLORIDE 25 MG PO TABS
25.0000 mg | ORAL_TABLET | Freq: Three times a day (TID) | ORAL | Status: DC
  Administered 2017-03-08 – 2017-03-11 (×12): 25 mg via ORAL
  Filled 2017-03-08 (×13): qty 1

## 2017-03-08 MED ORDER — TECHNETIUM TO 99M ALBUMIN AGGREGATED
5.0000 | Freq: Once | INTRAVENOUS | Status: AC | PRN
  Administered 2017-03-08: 5 via INTRAVENOUS

## 2017-03-08 MED ORDER — ACETAMINOPHEN 650 MG RE SUPP
650.0000 mg | Freq: Four times a day (QID) | RECTAL | Status: DC | PRN
Start: 2017-03-08 — End: 2017-03-12

## 2017-03-08 MED ORDER — TAMSULOSIN HCL 0.4 MG PO CAPS
0.4000 mg | ORAL_CAPSULE | Freq: Two times a day (BID) | ORAL | Status: DC
  Administered 2017-03-08 – 2017-03-11 (×9): 0.4 mg via ORAL
  Filled 2017-03-08 (×9): qty 1

## 2017-03-08 MED ORDER — POLYETHYLENE GLYCOL 3350 17 G PO PACK
17.0000 g | PACK | Freq: Three times a day (TID) | ORAL | Status: DC
  Administered 2017-03-08 – 2017-03-09 (×4): 17 g via ORAL
  Filled 2017-03-08 (×4): qty 1

## 2017-03-08 MED ORDER — PANTOPRAZOLE SODIUM 40 MG PO TBEC
40.0000 mg | DELAYED_RELEASE_TABLET | Freq: Every day | ORAL | Status: DC
Start: 2017-03-08 — End: 2017-03-12
  Administered 2017-03-08 – 2017-03-11 (×4): 40 mg via ORAL
  Filled 2017-03-08 (×4): qty 1

## 2017-03-08 MED ORDER — AZITHROMYCIN 250 MG PO TABS
500.0000 mg | ORAL_TABLET | Freq: Every day | ORAL | Status: AC
  Administered 2017-03-08: 500 mg via ORAL
  Filled 2017-03-08: qty 2

## 2017-03-08 MED ORDER — ACETAMINOPHEN 325 MG PO TABS: 650.0000 mg | ORAL_TABLET | Freq: Four times a day (QID) | ORAL | Status: DC | PRN

## 2017-03-08 MED ORDER — INSULIN DETEMIR 100 UNIT/ML ~~LOC~~ SOLN
10.0000 [IU] | Freq: Two times a day (BID) | SUBCUTANEOUS | Status: DC
  Administered 2017-03-08 – 2017-03-11 (×9): 10 [IU] via SUBCUTANEOUS
  Filled 2017-03-08 (×11): qty 0.1

## 2017-03-08 MED ORDER — ONDANSETRON HCL 4 MG/2ML IJ SOLN: 4.0000 mg | Freq: Four times a day (QID) | INTRAMUSCULAR | Status: DC | PRN

## 2017-03-08 MED ORDER — INSULIN ASPART 100 UNIT/ML ~~LOC~~ SOLN
0.0000 [IU] | Freq: Three times a day (TID) | SUBCUTANEOUS | Status: DC
  Administered 2017-03-08: 1 [IU] via SUBCUTANEOUS
  Administered 2017-03-08: 2 [IU] via SUBCUTANEOUS
  Administered 2017-03-08: 3 [IU] via SUBCUTANEOUS
  Administered 2017-03-09: 1 [IU] via SUBCUTANEOUS
  Administered 2017-03-09: 2 [IU] via SUBCUTANEOUS
  Administered 2017-03-09: 7 [IU] via SUBCUTANEOUS
  Administered 2017-03-10: 5 [IU] via SUBCUTANEOUS
  Administered 2017-03-10: 2 [IU] via SUBCUTANEOUS
  Administered 2017-03-10 – 2017-03-11 (×2): 1 [IU] via SUBCUTANEOUS
  Administered 2017-03-11: 3 [IU] via SUBCUTANEOUS
  Administered 2017-03-11 – 2017-03-12 (×2): 1 [IU] via SUBCUTANEOUS

## 2017-03-08 MED ORDER — POLYETHYLENE GLYCOL 3350 17 G PO PACK: 17.0000 g | PACK | Freq: Every day | ORAL | Status: DC | PRN

## 2017-03-08 MED ORDER — INFLUENZA VAC SPLIT HIGH-DOSE 0.5 ML IM SUSY
0.5000 mL | PREFILLED_SYRINGE | INTRAMUSCULAR | Status: AC
  Administered 2017-03-09: 0.5 mL via INTRAMUSCULAR
  Filled 2017-03-08: qty 0.5

## 2017-03-08 MED ORDER — CYCLOBENZAPRINE HCL 5 MG PO TABS: 5.0000 mg | ORAL_TABLET | Freq: Three times a day (TID) | ORAL | Status: DC | PRN

## 2017-03-08 MED ORDER — TRAZODONE HCL 50 MG PO TABS
50.0000 mg | ORAL_TABLET | Freq: Every evening | ORAL | Status: DC | PRN
  Administered 2017-03-09: 50 mg via ORAL
  Filled 2017-03-08: qty 1

## 2017-03-08 NOTE — Care Management Note (Signed)
Case Management Note  Patient Details  Name: Donald Calderon MRN: [id number] Date of Birth: 1941-03-21  Subjective/Objective:                  E coli UTI (urinary tract infection) Active Problems:   Acute metabolic encephalopathy   Encephalopathy  Action/Plan: Date:  March 08, 2017 Chart reviewed for concurrent status and case management needs.  Will continue to follow patient progress.  Discharge Planning: following for needs  Expected discharge date: [id number]  Donald Calderon, BSN, [place], [place]    Expected Discharge Date:                  Expected Discharge Plan:  [place]  In-House Referral:  Clinical Social Work  Discharge planning Services  CM Consult  Post Acute Care Choice:    Choice offered to:     DME Arranged:    DME Agency:     HH Arranged:    [place] Agency:     Status of Service:  In process, will continue to follow  If discussed at Long Length of Stay Meetings, dates discussed:    Additional Comments:  Leeroy Cha, RN 03/08/2017, 10:25 AM

## 2017-03-08 NOTE — H&P (Signed)
Patient Demographics:    Donald Calderon, is a 76 y.o. male  MRN: [id number]   DOB - [birth year]  Admit Date - 03/07/2017  Outpatient Primary MD for the patient is [name], NP   Assessment & Plan:    Principal Problem:   E coli UTI (urinary tract infection) Active Problems:   Acute metabolic encephalopathy   Encephalopathy   1)Metabolic encephalopathy- suspect secondary to persistent UTI, cultures (blood and urine) from recent admission (910/18 thru 03/01/17) grew pansensitive Escherichia coli, according to family members repeat culture at the nursing home recently grew Escherichia coli as well she has an indwelling Foley catheter, treat empirically with IV Rocephin pending blood and urine cultures. White count is 11..8, no fevers, patient is  hemodynamically stable. Foley catheter was changed in the ED, continue Flomax. CT head without acute findings, chest x-ray without acute findings, left knee x-rays with DJD otherwise no acute findings  2)Back Pain- status post lumbar surgery in August 2018 (01/21/17) - unable to ambulate, continue methocarbamol and when necessary oxycodone, will need ongoing physical therapy  3)HTN- BP is elevated, restart lisinopril 20 daily, amlodipine 10 mg daily,  may use IV Hydralazine 10 mg  Every 4 hours Prn for systolic blood pressure over 160 mmhg  4)DM-restart Lantus, Use Novolog/Humalog Sliding scale insulin with Accu-Cheks/Fingersticks as ordered   With History of - Reviewed by me  Past Medical History:  Diagnosis Date  . ALCOHOL ABUSE   . Anxiety   . Arthritis   . Depression   . Diabetes mellitus   . GERD (gastroesophageal reflux disease)   . Hypertension   . Pneumonia       Past Surgical History:  Procedure Laterality Date  . BACK SURGERY      disc fro picched  nerve times 2  . EYE SURGERY Right    Gaucoma  . LUMBAR LAMINECTOMY/DECOMPRESSION MICRODISCECTOMY N/A 01/21/2017   Procedure: Decompression Lumbar 2-3;  Surgeon: Melina Schools, MD;  Location: [facility];  Service: Orthopedics;  Laterality: N/A;  210 mins  . LUMBAR LAMINECTOMY/DECOMPRESSION MICRODISCECTOMY N/A 01/23/2017   Procedure: EXPLORATION AND EVACUATION OF HEMATOMA L2-L3;  Surgeon: Melina Schools, MD;  Location: [place];  Service: Orthopedics;  Laterality: N/A;  . PROSTATE SURGERY      Chief Complaint  Patient presents with  . Altered Mental Status      HPI:    Donald Calderon  is a 76 y.o. male,  With pmhx relevant for HTN, DM type II, alcohol abuse, and chronic back pain and recent Escherichia coli bacteremia and UTI who presents from nursing home with concerns about altered mentation/confusional episodes.  Patient's son and daughter are concerned that this was a similar presentation he had last time he had Escherichia coli UTI. Recent cultures (blood and urine) from recent admission (910/18 thru 03/01/17) grew pansensitive Escherichia coli, according to family members repeat culture at the nursing home recently grew Escherichia coli as well she  has an indwelling Foley catheter. Episodes of chills with diaphoresis over the last couple days.   No falls, no trauma, no productive cough or URI symptoms  Apparently post discharge from hospital  patient was treated with Keflex and Bactrim, recent repeat urine culture at the nursing facility apparently suggested a UTI and patient was treated with IM gentamicin from 03/05/17 thru 03/07/2017.  Patient does apparently be shivering/tremulous, with increased weakness at times could not hold a cup, confusion on and off,  CT head in the ED is without acute findings  No fevers no headaches, complains of back pain and knee pain  No vomiting , apparently had diarrhea a few days ago, no abdominal pain    Review of systems:    In addition to the HPI above,    A full 12 point Review of 10 Systems was done, except as stated above, all other Review of 10 Systems were negative.    Social History:  Reviewed by me    Social History  Substance Use Topics  . Smoking status: Former Smoker    Packs/day: 1.00    Years: 39.00    Types: Cigarettes    Quit date: 2000  . Smokeless tobacco: Former Systems developer    Types: Chew    Quit date: 09/26/1997  . Alcohol use 2.4 oz/week    4 Cans of beer per week     Comment: 4 cans of beer per day     Family History :  Reviewed by me    Family History  Problem Relation Age of Onset  . Diabetes Mother   . Diabetes Father      Home Medications:   Prior to Admission medications   Medication Sig Start Date End Date Taking? Authorizing Provider  acetaminophen (TYLENOL) 325 MG tablet Take 1-2 tablets (325-650 mg total) by mouth every 4 (four) hours as needed for mild pain. 02/11/17  Yes Love, Ivan Anchors, PA-C  amLODipine (NORVASC) 10 MG tablet Take 1 tablet (10 mg total) by mouth daily. 02/12/17  Yes Love, Ivan Anchors, PA-C  bethanechol (URECHOLINE) 25 MG tablet Take 1 tablet (25 mg total) by mouth 3 (three) times daily. 02/12/17  Yes Love, Ivan Anchors, PA-C  bisacodyl (DULCOLAX) 5 MG EC tablet Take 1 tablet (5 mg total) by mouth daily as needed for moderate constipation. 03/01/17 03/01/18 Yes Arrien, Jimmy Picket, MD  cyclobenzaprine (FLEXERIL) 5 MG tablet Take 1 tablet (5 mg total) by mouth 3 (three) times daily as needed for muscle spasms. 02/12/17  Yes Love, Ivan Anchors, PA-C  gabapentin (NEURONTIN) 100 MG capsule Take 1 capsule (100 mg total) by mouth 3 (three) times daily. 02/12/17  Yes Love, Ivan Anchors, PA-C  HYDROcodone-acetaminophen (NORCO) 10-325 MG tablet Take 1 tablet by mouth every 12 (twelve) hours as needed for severe pain. Patient taking differently: Take 0.5-1 tablets by mouth every 12 (twelve) hours as needed for severe pain.  03/01/17  Yes Arrien, Jimmy Picket, MD  hydrocortisone (ANUSOL-HC) 2.5 % rectal cream  Place rectally 4 (four) times daily. Patient taking differently: Place 1 application rectally as needed for hemorrhoids.  02/12/17  Yes Love, Ivan Anchors, PA-C  LEVEMIR 100 UNIT/ML injection Inject 0.1-0.3 mLs (10-30 Units total) into the skin 2 (two) times daily. 10 units every morning, and 34 units every evening 02/12/17  Yes Love, Ivan Anchors, PA-C  lidocaine (XYLOCAINE) 2 % jelly Apply 1 application topically as needed. Use with in and out catheter 02/18/17  Yes [provider]  lisinopril (PRINIVIL,ZESTRIL) 20 MG tablet Take 20 mg by mouth daily. 02/12/17  Yes [provider]  polyethylene glycol (MIRALAX / GLYCOLAX) packet Take 17 g by mouth 3 (three) times daily with meals. 02/12/17  Yes Love, Ivan Anchors, PA-C  pravastatin (PRAVACHOL) 10 MG tablet Take 1 tablet (10 mg total) by mouth every evening. 02/12/17  Yes Love, Ivan Anchors, PA-C  tamsulosin (FLOMAX) 0.4 MG CAPS capsule Take 1 capsule (0.4 mg total) by mouth 2 (two) times daily. 02/12/17  Yes Love, Ivan Anchors, PA-C  traMADol (ULTRAM) 50 MG tablet Take 50 mg by mouth every 6 (six) hours as needed for moderate pain. 02/12/17  Yes [provider]  Calcium Carbonate Antacid (ALKA-SELTZER ANTACID PO) Take 1 tablet by mouth as needed (heartburn).    [provider]  diclofenac sodium (VOLTAREN) 1 % GEL Apply 2 g topically 4 (four) times daily. 03/01/17   Arrien, Jimmy Picket, MD  insulin aspart (NOVOLOG) 100 UNIT/ML injection Inject 4-6 Units into the skin 3 (three) times daily before meals. Per sliding scale . If CBG>150 give 4 units, if CBG >200 give 6 units.    [provider]  pantoprazole (PROTONIX) 40 MG tablet Take 1 tablet (40 mg total) by mouth daily. 03/01/17 03/31/17  Arrien, Jimmy Picket, MD     Allergies:     Allergies  Allergen Reactions  . Penicillins Itching and Rash    Has patient had a PCN reaction causing immediate rash, facial/tongue/throat swelling, SOB or lightheadedness with hypotension:  No Has patient had a PCN reaction causing severe rash involving mucus membranes or skin necrosis: No Has patient had a PCN reaction that required hospitalization: No Has patient had a PCN reaction occurring within the last 10 years: No If all of the above answers are "NO", then may proceed with Cephalosporin use.  Tolerated Ancef      Physical Exam:   Vitals  Blood pressure (!) 165/64, pulse 97, temperature 99 F (37.2 C), temperature source Oral, resp. rate 20, height 5\' 11"  (1.803 m), weight 94.3 kg (208 lb), SpO2 100 %.  Physical Examination: General appearance - alert,Chronically appearing, and in no distress  Mental status - alert, oriented to person, place, and time (as per nursing home he has had intermittent confusion) Eyes - sclera anicteric Neck - supple, no JVD elevation , Chest - clear  to auscultation bilaterally, symmetrical air movement,  Heart - S1 and S2 normal,  Abdomen - soft, nontender, nondistended, no masses or organomegaly Neurological - neck supple without rigidity, generalized weakness without new focal deficits Extremities/MSK - pedal edema noted, intact peripheral pulses , significant discomfort with range of motion of the left knee left knee is swollen and slightly warm but apparently this is not different from his baseline. Lumbar area discomfort, exam of the lumbar area reveals healed scar from recent surgery, no evidence of fluctuance, erythema or warmth or other evidence of cellulitis SUPERIMPOSED infection Skin - warm, dry GU-Foley with indwelling catheter urine is cloudy   Data Review:    CBC  Recent Labs Lab 03/07/17 1602  WBC 11.8*  HGB 9.7*  HCT 30.1*  PLT 536*  MCV 89.1  MCH 28.7  MCHC 32.2  RDW 13.8  LYMPHSABS 1.4  MONOABS 1.2*  EOSABS 0.3  BASOSABS 0.0   ------------------------------------------------------------------------------------------------------------------  Chemistries   Recent Labs Lab 03/01/17 0324  03/07/17 1602  NA 139 142  K 3.2* 3.2*  CL 108 102  CO2 23 28  GLUCOSE 131* 124*  BUN 12 14  CREATININE 1.46* 1.60*  CALCIUM 8.1* 9.2  AST  --  25  ALT  --  17  ALKPHOS  --  109  BILITOT  --  0.3   ------------------------------------------------------------------------------------------------------------------ estimated creatinine clearance is 46.8 mL/min (A) (by C-G formula based on SCr of 1.6 mg/dL (H)). ------------------------------------------------------------------------------------------------------------------ No results for input(s): TSH, T4TOTAL, T3FREE, THYROIDAB in the last 72 hours.  Invalid input(s): FREET3   Coagulation profile No results for input(s): INR, PROTIME in the last 168 hours. ------------------------------------------------------------------------------------------------------------------- No results for input(s): DDIMER in the last 72 hours. -------------------------------------------------------------------------------------------------------------------  Cardiac Enzymes No results for input(s): CKMB, TROPONINI, MYOGLOBIN in the last 168 hours.  Invalid input(s): CK ------------------------------------------------------------------------------------------------------------------ No results found for: BNP   ---------------------------------------------------------------------------------------------------------------  Urinalysis    Component Value Date/Time   COLORURINE RED (A) 03/07/2017 1744   APPEARANCEUR TURBID (A) 03/07/2017 1744   LABSPEC  03/07/2017 1744    TEST NOT REPORTED DUE TO COLOR INTERFERENCE OF URINE PIGMENT   PHURINE  03/07/2017 1744    TEST NOT REPORTED DUE TO COLOR INTERFERENCE OF URINE PIGMENT   GLUCOSEU (A) 03/07/2017 1744    TEST NOT REPORTED DUE TO COLOR INTERFERENCE OF URINE PIGMENT   HGBUR (A) 03/07/2017 1744    TEST NOT REPORTED DUE TO COLOR INTERFERENCE OF URINE PIGMENT   HGBUR trace-intact 08/08/2010 1216    BILIRUBINUR (A) 03/07/2017 1744    TEST NOT REPORTED DUE TO COLOR INTERFERENCE OF URINE PIGMENT   KETONESUR (A) 03/07/2017 1744    TEST NOT REPORTED DUE TO COLOR INTERFERENCE OF URINE PIGMENT   PROTEINUR (A) 03/07/2017 1744    TEST NOT REPORTED DUE TO COLOR INTERFERENCE OF URINE PIGMENT   UROBILINOGEN 0.2 09/27/2011 1540   NITRITE (A) 03/07/2017 1744    TEST NOT REPORTED DUE TO COLOR INTERFERENCE OF URINE PIGMENT   LEUKOCYTESUR (A) 03/07/2017 1744    TEST NOT REPORTED DUE TO COLOR INTERFERENCE OF URINE PIGMENT    ----------------------------------------------------------------------------------------------------------------   Imaging Results:    Dg Chest 2 View  Result Date: 03/07/2017 CLINICAL DATA:  Altered mental status and fever past 12 hours. EXAM: CHEST  2 VIEW COMPARISON:  02/22/2017 and 04/11/2010 FINDINGS: Lungs are adequately inflated without airspace consolidation or effusion. Mild stable cardiomegaly. Minimal calcified plaque over the thoracic aorta. There mild degenerate changes of the spine. IMPRESSION: No acute cardiopulmonary disease. Mild stable cardiomegaly. Aortic Atherosclerosis (ICD10-I70.0). Electronically Signed   By: Marin Olp M.D.   On: 03/07/2017 16:46   Ct Head Wo Contrast  Result Date: 03/07/2017 CLINICAL DATA:  Altered mental status and fever. EXAM: CT HEAD WITHOUT CONTRAST TECHNIQUE: Contiguous axial images were obtained from the base of the skull through the vertex without intravenous contrast. COMPARISON:  02/11/2010 FINDINGS: Brain: Ventricles and cisterns are within normal. There is mild chronic ischemic microvascular disease present. Encephalomalacia over the left temporal region likely postsurgical. No mass, mass effect, shift midline structures or acute hemorrhage. No evidence of acute infarction. Vascular: No hyperdense vessel or unexpected calcification. Skull: Previous left temporal craniotomy.  No acute fracture. Sinuses/Orbits: No acute finding.  Other: None. IMPRESSION: No acute intracranial findings. Mild chronic ischemic microvascular disease. Postsurgical change over the left temporal region. Electronically Signed   By: Marin Olp M.D.   On: 03/07/2017 16:38   Dg Knee Complete 4 Views Left  Result Date: 03/07/2017 CLINICAL DATA:  Left anterior knee pain when moving. EXAM: LEFT KNEE - COMPLETE 4+ VIEW COMPARISON:  None. FINDINGS: Osteopenia. Moderate tricompartmental osteoarthritic change  worse over the medial compartment. Small joint effusion. No acute fracture or dislocation. IMPRESSION: No acute findings. 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 Small joint effusion. Electronically Signed   By: Marin Olp M.D.   On: 03/07/2017 16:47    Radiological Exams on Admission: Dg Chest 2 View  Result Date: 03/07/2017 CLINICAL DATA:  Altered mental status and fever past 12 hours. EXAM: CHEST  2 VIEW COMPARISON:  02/22/2017 and 04/11/2010 FINDINGS: Lungs are adequately inflated without airspace consolidation or effusion. Mild stable cardiomegaly. Minimal calcified plaque over the thoracic aorta. There mild degenerate changes of the spine. IMPRESSION: No acute cardiopulmonary disease. Mild stable cardiomegaly. Aortic Atherosclerosis (ICD10-I70.0). Electronically Signed   By: Marin Olp M.D.   On: 03/07/2017 16:46   Ct Head Wo Contrast  Result Date: 03/07/2017 CLINICAL DATA:  Altered mental status and fever. EXAM: CT HEAD WITHOUT CONTRAST TECHNIQUE: Contiguous axial images were obtained from the base of the skull through the vertex without intravenous contrast. COMPARISON:  02/11/2010 FINDINGS: Brain: Ventricles and cisterns are within normal. There is mild chronic ischemic microvascular disease present. Encephalomalacia over the left temporal region likely postsurgical. No mass, mass effect, shift midline structures or acute hemorrhage. No evidence of acute infarction. Vascular: No hyperdense vessel or  unexpected calcification. Skull: Previous left temporal craniotomy.  No acute fracture. Sinuses/Orbits: No acute finding. Other: None. IMPRESSION: No acute intracranial findings. Mild chronic ischemic microvascular disease. Postsurgical change over the left temporal region. Electronically Signed   By: Marin Olp M.D.   On: 03/07/2017 16:38   Dg Knee Complete 4 Views Left  Result Date: 03/07/2017 CLINICAL DATA:  Left anterior knee pain when moving. EXAM: LEFT KNEE - COMPLETE 4+ VIEW COMPARISON:  None. FINDINGS: Osteopenia. Moderate tricompartmental osteoarthritic change worse over the medial compartment. Small joint effusion. No acute fracture or dislocation. IMPRESSION: No acute findings. 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 Small joint effusion. Electronically Signed   By: Marin Olp M.D.   On: 03/07/2017 16:47    DVT Prophylaxis -SCD   AM Labs Ordered, also please review Full Orders  Family Communication: Admission, patients condition and plan of care including tests being ordered have been discussed with the patient and son and daughter at bedside who indicate understanding and agree with the plan   Code Status - Full Code  Likely DC to  SNF   Condition   stable  Kourtland Coopman M.D on 03/08/2017 at 1:12 AM   Between 7am to 7pm - Pager - [phone]  After 7pm go to [email or web address] - password [email or web address]  [facility] - Office  [phone]  Voice Recognition Viviann Spare dictation system was used to create this note, attempts have been made to correct errors. Please contact the author with questions and/or clarifications.

## 2017-03-08 NOTE — Progress Notes (Signed)
[facility] PROGRESS NOTE  Donald Calderon TIR:[id number] DOB: 11/03/1940 DOA: 03/07/2017 PCP: [name], NP  Brief summary    76 y.o. male,  With pmhx relevant for HTN, DM type II, alcohol abuse, and chronic back pain and recent Escherichia coli bacteremia and UTI who presents from nursing home with concerns about altered mentation/confusional episodes.  Patient's son and daughter are concerned that this was a similar presentation he had last time he had Escherichia coli UTI. Recent cultures (blood and urine) from recent admission (910/18 thru 03/01/17) grew pansensitive Escherichia coli, according to family members repeat culture at the nursing home recently grew Escherichia coli as well she has an indwelling Foley catheter. Episodes of chills with diaphoresis over the last couple days.   Apparently post discharge from hospital  patient was treated with Keflex and Bactrim, recent repeat urine culture at the nursing facility apparently suggested a UTI and patient was treated with IM gentamicin from 03/05/17 thru 03/07/2017.  Patient does apparently be shivering/tremulous, with increased weakness at times could not hold a cup, confusion on and off,  CT head in the ED is without acute findings  Assessment/Plan:  Metabolic encephalopathy- suspect secondary to persistent UTI, cultures (blood and urine) from recent admission (910/18 thru 03/01/17) grew pansensitive Escherichia coli, according to family members repeat culture at the nursing home recently grew Escherichia coli as well she has an indwelling Foley catheter -treat empirically with IV Rocephin pending blood and urine cultures. patient is  hemodynamically stable. Foley catheter was changed in the ED, continue Flomax. CT head without acute findings, chest x-ray without acute findings, left knee x-rays with DJD otherwise no acute findings  Back Pain- status post lumbar surgery in August 2018 (01/21/17) - unable to ambulate,  continue methocarbamol and when necessary oxycodone, will need ongoing physical therapy  HTN- BP is elevated, restart lisinopril 20 daily, amlodipine 10 mg daily,  may use IV Hydralazine 10 mg  Every 4 hours Prn for systolic blood pressure over 160 mmhg  DM-restart Lantus, Use Novolog/Humalog Sliding scale insulin with Accu-Cheks/Fingersticks as ordered   Hypokalemia. Replace and recheck   Pleuritic chest pain. No hypoxia, no acute SOB. Will obtain VQ scan   Mild productive cough. Will cont ceftriaxone. Add azithromycin    Code Status: full Family Communication: d/w patient, his wife.  (indicate person spoken with, relationship, and if by phone, the number) Disposition Plan: snf when stable. 2-3 days    Consultants:  none  Procedures:  Pend VQ  Antibiotics:  Ceftriaxone 9/24>>  Azithromycin 9/24>> (indicate start date, and stop date if known)  HPI/Subjective: Reports pleuritic chest pain. No acute SOB. No acute chest pains. No distress. Reports left knee pain.   Objective: Vitals:   03/08/17 0009 03/08/17 0537  BP: (!) 165/64 140/76  Pulse: 97 74  Resp: 20 16  Temp: 99 F (37.2 C) 98.7 F (37.1 C)  SpO2: 100% 100%    Intake/Output Summary (Last 24 hours) at 03/08/17 1203 Last data filed at 03/08/17 1116  Gross per 24 hour  Intake              120 ml  Output             1200 ml  Net            -1080 ml   Filed Weights   03/07/17 1537  Weight: 94.3 kg (208 lb)    Exam:   General:  No distress   Cardiovascular: s1,s2 rrr  Respiratory: no wheezing   Abdomen: soft, nt  Musculoskeletal: no pedal edema    Data Reviewed: Basic Metabolic Panel:  Recent Labs Lab 03/07/17 1602 03/08/17 0753  NA 142 143  K 3.2* 3.0*  CL 102 102  CO2 28 31  GLUCOSE 124* 154*  BUN 14 15  CREATININE 1.60* 1.51*  CALCIUM 9.2 9.0   Liver Function Tests:  Recent Labs Lab 03/07/17 1602  AST 25  ALT 17  ALKPHOS 109  BILITOT 0.3  PROT 7.1  ALBUMIN 2.8*    No results for input(s): LIPASE, AMYLASE in the last 168 hours. No results for input(s): AMMONIA in the last 168 hours. CBC:  Recent Labs Lab 03/07/17 1602 03/08/17 0753  WBC 11.8* 11.0*  NEUTROABS 8.8*  --   HGB 9.7* 9.3*  HCT 30.1* 28.8*  MCV 89.1 89.2  PLT 536* 540*   Cardiac Enzymes: No results for input(s): CKTOTAL, CKMB, CKMBINDEX, TROPONINI in the last 168 hours. BNP (last 3 results) No results for input(s): BNP in the last 8760 hours.  ProBNP (last 3 results) No results for input(s): PROBNP in the last 8760 hours.  CBG:  Recent Labs Lab 03/01/17 1223 03/07/17 1649 03/08/17 0015 03/08/17 0757 03/08/17 1133  GLUCAP 186* 103* 277* 150* 232*    Recent Results (from the past 240 hour(s))  Culture, blood (Routine X 2) w Reflex to ID Panel     Status: None   Collection Time: 02/26/17  2:55 PM  Result Value Ref Range Status   Specimen Description BLOOD RIGHT ANTECUBITAL  Final   Special Requests   Final    BOTTLES DRAWN AEROBIC AND ANAEROBIC Blood Culture adequate volume   Culture NO GROWTH 5 DAYS  Final   Report Status 03/03/2017 FINAL  Final  Culture, blood (Routine X 2) w Reflex to ID Panel     Status: None   Collection Time: 02/26/17  2:59 PM  Result Value Ref Range Status   Specimen Description BLOOD RIGHT ANTECUBITAL  Final   Special Requests   Final    BOTTLES DRAWN AEROBIC AND ANAEROBIC Blood Culture adequate volume   Culture NO GROWTH 5 DAYS  Final   Report Status 03/03/2017 FINAL  Final  MRSA PCR Screening     Status: None   Collection Time: 03/08/17  1:50 AM  Result Value Ref Range Status   MRSA by PCR NEGATIVE NEGATIVE Final    Comment:        The GeneXpert MRSA Assay (FDA approved for NASAL specimens only), is one component of a comprehensive MRSA colonization surveillance program. It is not intended to diagnose MRSA infection nor to guide or monitor treatment for MRSA infections.      Studies: Dg Chest 2 View  Result Date:  03/07/2017 CLINICAL DATA:  Altered mental status and fever past 12 hours. EXAM: CHEST  2 VIEW COMPARISON:  02/22/2017 and 04/11/2010 FINDINGS: Lungs are adequately inflated without airspace consolidation or effusion. Mild stable cardiomegaly. Minimal calcified plaque over the thoracic aorta. There mild degenerate changes of the spine. IMPRESSION: No acute cardiopulmonary disease. Mild stable cardiomegaly. Aortic Atherosclerosis (ICD10-I70.0). Electronically Signed   By: Marin Olp M.D.   On: 03/07/2017 16:46   Ct Head Wo Contrast  Result Date: 03/07/2017 CLINICAL DATA:  Altered mental status and fever. EXAM: CT HEAD WITHOUT CONTRAST TECHNIQUE: Contiguous axial images were obtained from the base of the skull through the vertex without intravenous contrast. COMPARISON:  02/11/2010 FINDINGS: Brain: Ventricles and cisterns are within normal.  There is mild chronic ischemic microvascular disease present. Encephalomalacia over the left temporal region likely postsurgical. No mass, mass effect, shift midline structures or acute hemorrhage. No evidence of acute infarction. Vascular: No hyperdense vessel or unexpected calcification. Skull: Previous left temporal craniotomy.  No acute fracture. Sinuses/Orbits: No acute finding. Other: None. IMPRESSION: No acute intracranial findings. Mild chronic ischemic microvascular disease. Postsurgical change over the left temporal region. Electronically Signed   By: Marin Olp M.D.   On: 03/07/2017 16:38   Dg Knee Complete 4 Views Left  Result Date: 03/07/2017 CLINICAL DATA:  Left anterior knee pain when moving. EXAM: LEFT KNEE - COMPLETE 4+ VIEW COMPARISON:  None. FINDINGS: Osteopenia. Moderate tricompartmental osteoarthritic change worse over the medial compartment. Small joint effusion. No acute fracture or dislocation. IMPRESSION: No acute findings. 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 Small joint effusion. Electronically Signed    By: Marin Olp M.D.   On: 03/07/2017 16:47    Scheduled Meds: . amLODipine  10 mg Oral Daily  . bethanechol  25 mg Oral TID  . diclofenac sodium  2 g Topical QID  . gabapentin  100 mg Oral TID  . heparin  5,000 Units Subcutaneous Q8H  . [START ON 03/09/2017] Influenza vac split quadrivalent PF  0.5 mL Intramuscular Tomorrow-1000  . insulin aspart  0-9 Units Subcutaneous TID WC  . insulin detemir  10 Units Subcutaneous BID  . lisinopril  20 mg Oral Daily  . methocarbamol  750 mg Oral TID  . pantoprazole  40 mg Oral Daily  . polyethylene glycol  17 g Oral TID WC  . pravastatin  10 mg Oral QPM  . senna  1 tablet Oral BID  . sodium chloride flush  3 mL Intravenous Q12H  . tamsulosin  0.4 mg Oral BID   Continuous Infusions: . sodium chloride 250 mL (03/08/17 0037)  . cefTRIAXone (ROCEPHIN)  IV Stopped (03/08/17 0146)    Principal Problem:   E coli UTI (urinary tract infection) Active Problems:   Acute metabolic encephalopathy   Encephalopathy    Time spent: >35 minutes     Kinnie Feil  [facility] Pager [phone]. If 7PM-7AM, please contact night-coverage at [email or web address], password [facility] 03/08/2017, 12:03 PM  LOS: 1 day

## 2017-03-09 DIAGNOSIS — G9341 Metabolic encephalopathy: Secondary | ICD-10-CM

## 2017-03-09 LAB — GLUCOSE, CAPILLARY
GLUCOSE-CAPILLARY: 142 mg/dL — AB (ref 65–99)
GLUCOSE-CAPILLARY: 305 mg/dL — AB (ref 65–99)
GLUCOSE-CAPILLARY: 151 mg/dL — AB (ref 65–99)
GLUCOSE-CAPILLARY: 167 mg/dL — AB (ref 65–99)

## 2017-03-09 LAB — BASIC METABOLIC PANEL
ANION GAP: 8 (ref 5–15)
BUN: 11 mg/dL (ref 6–20)
CHLORIDE: 101 mmol/L (ref 101–111)
CO2: 32 mmol/L (ref 22–32)
Calcium: 9 mg/dL (ref 8.9–10.3)
Creatinine, Ser: 1.32 mg/dL — ABNORMAL HIGH (ref 0.61–1.24)
GFR calc non Af Amer: 51 mL/min — ABNORMAL LOW (ref >60)
GFR, EST AFRICAN AMERICAN: 59 mL/min — AB (ref >60)
Glucose, Bld: 148 mg/dL — ABNORMAL HIGH (ref 65–99)
POTASSIUM: 3.6 mmol/L (ref 3.5–5.1)
SODIUM: 141 mmol/L (ref 135–145)

## 2017-03-09 LAB — URINE CULTURE: CULTURE: NO GROWTH

## 2017-03-09 MED ORDER — WITCH HAZEL-GLYCERIN EX PADS
MEDICATED_PAD | CUTANEOUS | Status: DC | PRN
  Filled 2017-03-09: qty 100

## 2017-03-09 NOTE — NC FL2 (Signed)
[place] LEVEL OF CARE SCREENING TOOL     IDENTIFICATION  Patient Name: Donald Calderon Birthdate: 05-01-1941 Sex: male Admission Date (Current Location): 03/07/2017  [facility] and [place] Number:  Herbalist and Address:  [facility],  [address], [place]      Provider Number: [id number]  Attending Physician Name and Address:  Kinnie Feil, MD  Relative Name and Phone Number:  Izell [place], spouse, [phone]    Current Level of Care: Hospital Recommended Level of Care: [place] Prior Approval Number:    Date Approved/Denied:   PASRR Number: [id number]  Discharge Plan: SNF    Current Diagnoses: Patient Active Problem List   Diagnosis Date Noted  . E coli UTI (urinary tract infection) 03/07/2017  . Acute metabolic encephalopathy 94/85/4627  . Encephalopathy 03/07/2017  . Normocytic anemia 02/23/2017  . Neurogenic bladder 02/23/2017  . AKI (acute kidney injury) ([place]) 02/23/2017  . Sepsis secondary to UTI ([place]) 02/22/2017  . Postoperative ileus ([place]) 02/12/2017  . Urine retention 01/29/2017  . 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 Lumbar stenosis 01/26/2017  . Diabetes mellitus type 2 in obese ([place])   . Benign essential HTN   . ETOH abuse   . Chronic pain syndrome   . Hyponatremia   . Leukocytosis   . Acute blood loss anemia   . Epidural hematoma ([place]) 01/23/2017  . Status post lumbar surgery 01/21/2017  . Status post lumbar spine operation 01/21/2017  . Chronic back pain 09/27/2011  . NEUROPATHY 08/08/2010  . SWELLING MASS OR LUMP IN HEAD AND NECK 06/27/2010  . BENIGN PROSTATIC HYPERTROPHY, WITH URINARY OBSTRUCTION 06/19/2010  . 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  . HYPOKALEMIA 03/18/2010  . 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 Diabetes ([place]) 02/07/2010  . HYPERLIPIDEMIA 02/07/2010  . ALCOHOL ABUSE 02/07/2010  . HYPERTENSION 02/07/2010  . SPINAL STENOSIS, LUMBAR 02/07/2010     Orientation RESPIRATION BLADDER Height & Weight     Self, Time, Situation, Place  Normal Incontinent, Indwelling catheter Weight: 203 lb 9.6 oz (92.4 kg) Height:  5\' 11"  (180.3 cm)  BEHAVIORAL SYMPTOMS/MOOD NEUROLOGICAL BOWEL NUTRITION STATUS      Continent Diet (Please see D/C Summary )  AMBULATORY STATUS COMMUNICATION OF NEEDS Skin   Extensive Assist Verbally                         Personal Care Assistance Level of Assistance  Bathing, Feeding, Dressing Bathing Assistance: Maximum assistance Feeding assistance: Independent Dressing Assistance: Maximum assistance     Functional Limitations Info  Sight Sight Info: Adequate        SPECIAL CARE FACTORS FREQUENCY  PT (By licensed PT), OT (By licensed OT)  5X week  5X/week                  Contractures Contractures Info: Not present    Additional Factors Info  Code Status, Allergies, Insulin Sliding Scale Code Status Info: Fullcode Allergies Info: Penicillins    Insulin Sliding Scale Info: 3x daily w. meals and a bedtime       Current Medications (03/09/2017):  This is the current hospital active medication list Current Facility-Administered Medications  Medication Dose Route Frequency Provider Last Rate Last Dose  . 0.9 %  sodium chloride infusion  250 mL Intravenous PRN Roxan Hockey, MD 10 mL/hr at 03/08/17 0037 250 mL at 03/08/17 0037  . acetaminophen (TYLENOL) tablet 650 mg  650 mg Oral  Q6H PRN Roxan Hockey, MD       Or  . acetaminophen (TYLENOL) suppository 650 mg  650 mg Rectal Q6H PRN Emokpae, Courage, MD      . acetaminophen (TYLENOL) tablet 325-650 mg  325-650 mg Oral Q4H PRN Emokpae, Courage, MD      . albuterol (PROVENTIL) (2.5 MG/3ML) 0.083% nebulizer solution 2.5 mg  2.5 mg Nebulization Q2H PRN Emokpae, Courage, MD      . amLODipine (NORVASC) tablet 10 mg  10 mg Oral Daily Emokpae, Courage, MD   10 mg at 03/09/17 0935  . azithromycin (ZITHROMAX) tablet 250 mg  250 mg Oral Daily  Kinnie Feil, MD   250 mg at 03/09/17 0935  . bethanechol (URECHOLINE) tablet 25 mg  25 mg Oral TID Roxan Hockey, MD   25 mg at 03/09/17 0935  . bisacodyl (DULCOLAX) EC tablet 5 mg  5 mg Oral Daily PRN Emokpae, Courage, MD      . cefTRIAXone (ROCEPHIN) 1 g in dextrose 5 % 50 mL IVPB  1 g Intravenous QHS Emokpae, Courage, MD 100 mL/hr at 03/08/17 2148 1 g at 03/08/17 2148  . cyclobenzaprine (FLEXERIL) tablet 5 mg  5 mg Oral TID PRN Emokpae, Courage, MD      . diclofenac sodium (VOLTAREN) 1 % transdermal gel 2 g  2 g Topical QID Emokpae, Courage, MD   2 g at 03/09/17 1000  . gabapentin (NEURONTIN) capsule 100 mg  100 mg Oral TID Roxan Hockey, MD   100 mg at 03/09/17 0935  . heparin injection 5,000 Units  5,000 Units Subcutaneous Q8H Roxan Hockey, MD   5,000 Units at 03/09/17 0631  . hydrALAZINE (APRESOLINE) injection 10 mg  10 mg Intravenous Q6H PRN Emokpae, Courage, MD      . HYDROcodone-acetaminophen (NORCO) 10-325 MG per tablet 1 tablet  1 tablet Oral Q12H PRN Emokpae, Courage, MD      . insulin aspart (novoLOG) injection 0-9 Units  0-9 Units Subcutaneous TID WC Emokpae, Courage, MD   7 Units at 03/09/17 1242  . insulin detemir (LEVEMIR) injection 10 Units  10 Units Subcutaneous BID Roxan Hockey, MD   10 Units at 03/09/17 0934  . lisinopril (PRINIVIL,ZESTRIL) tablet 20 mg  20 mg Oral Daily Emokpae, Courage, MD   20 mg at 03/09/17 0935  . methocarbamol (ROBAXIN) tablet 750 mg  750 mg Oral TID Roxan Hockey, MD   750 mg at 03/09/17 0934  . ondansetron (ZOFRAN) tablet 4 mg  4 mg Oral Q6H PRN Emokpae, Courage, MD       Or  . ondansetron (ZOFRAN) injection 4 mg  4 mg Intravenous Q6H PRN Emokpae, Courage, MD      . oxyCODONE (Oxy IR/ROXICODONE) immediate release tablet 5 mg  5 mg Oral Q4H PRN Denton Brick, Courage, MD   5 mg at 03/08/17 0037  . pantoprazole (PROTONIX) EC tablet 40 mg  40 mg Oral Daily Emokpae, Courage, MD   40 mg at 03/09/17 0935  . polyethylene glycol (MIRALAX /  GLYCOLAX) packet 17 g  17 g Oral Daily PRN Emokpae, Courage, MD      . pravastatin (PRAVACHOL) tablet 10 mg  10 mg Oral QPM Emokpae, Courage, MD   10 mg at 03/08/17 1717  . sodium chloride flush (NS) 0.9 % injection 3 mL  3 mL Intravenous Q12H Emokpae, Courage, MD   3 mL at 03/09/17 1000  . sodium chloride flush (NS) 0.9 % injection 3 mL  3 mL Intravenous PRN Emokpae,  Courage, MD      . tamsulosin (FLOMAX) capsule 0.4 mg  0.4 mg Oral BID Denton Brick, Courage, MD   0.4 mg at 03/09/17 0936  . traMADol (ULTRAM) tablet 50 mg  50 mg Oral Q6H PRN Roxan Hockey, MD   50 mg at 03/09/17 7915  . traZODone (DESYREL) tablet 50 mg  50 mg Oral QHS PRN Roxan Hockey, MD   50 mg at 03/09/17 0022  . witch hazel-glycerin (TUCKS) pad   Topical PRN Daleen Bo Arie Sabina, MD         Discharge Medications: Please see discharge summary for a list of discharge medications.  Relevant Imaging Results:  Relevant Lab Results:   Additional Information SSN: [place] Elaf [place], [place]

## 2017-03-09 NOTE — Progress Notes (Addendum)
[facility] PROGRESS NOTE  Donald Calderon TXM:[id number] DOB: 09/18/1940 DOA: 03/07/2017 PCP: [name], NP  Brief summary    76 y.o. male,  With pmhx relevant for HTN, DM type II, alcohol abuse, and chronic back pain and recent Escherichia coli bacteremia and UTI who presents from nursing home with concerns about altered mentation/confusional episodes.  Patient's son and daughter are concerned that this was a similar presentation he had last time he had Escherichia coli UTI. Recent cultures (blood and urine) from recent admission (910/18 thru 03/01/17) grew pansensitive Escherichia coli, according to family members repeat culture at the nursing home recently grew Escherichia coli as well she has an indwelling Foley catheter. Episodes of chills with diaphoresis over the last couple days.   Apparently post discharge from hospital  patient was treated with Keflex and Bactrim, recent repeat urine culture at the nursing facility apparently suggested a UTI and patient was treated with IM gentamicin from 03/05/17 thru 03/07/2017.  Patient does apparently be shivering/tremulous, with increased weakness at times could not hold a cup, confusion on and off,  CT head in the ED is without acute findings  Assessment/Plan:  Metabolic encephalopathy- suspect secondary to persistent UTI, cultures (blood and urine) from recent admission (910/18 thru 03/01/17) grew pansensitive Escherichia coli, according to family members repeat culture at the nursing home recently grew Escherichia coli as well she has an indwelling Foley catheter -encephalopathy is resolving. Cont empiric with IV Rocephin pending blood and urine cultures. patient is  hemodynamically stable. Foley catheter was changed in the ED, continue Flomax. CT head without acute findings, chest x-ray without acute findings, left knee x-rays with DJD otherwise no acute findings  Back Pain- status post lumbar surgery in August 2018 (01/21/17) -  unable to ambulate, continue methocarbamol and when necessary oxycodone, will need ongoing physical therapy at SNF  HTN- BP is elevated, restarted lisinopril 20 daily, amlodipine 10 mg daily,   IV Hydralazine 10 mg  Every 4 hours Prn for systolic blood pressure over 160 mmhg  DM-restarted Lantus, Use Novolog/Humalog Sliding scale insulin with Accu-Cheks/Fingersticks as ordered  Hypokalemia. Replaced  Pleuritic chest pain. No hypoxia, no acute SOB. VQ scan: low prob. He reports mild cough, posisble bronchitis. To complete azithromycin    Dispo: cont iv atx today, obtain PT eval  Code Status: full Family Communication: d/w patient, his wife.  (indicate person spoken with, relationship, and if by phone, the number) Disposition Plan: snf when stable. 2-3 days    Consultants:  none  Procedures:  VQ  Antibiotics:  Ceftriaxone 9/24>>  Azithromycin 9/24>> (indicate start date, and stop date if known)  HPI/Subjective: Reports pleuritic chest pain. No acute SOB. No acute chest pains. No distress. Reports left knee pain.   Objective: Vitals:   03/09/17 0534 03/09/17 0930  BP: 136/69 127/60  Pulse: 76 82  Resp: 20   Temp: 98.4 F (36.9 C)   SpO2: 99%     Intake/Output Summary (Last 24 hours) at 03/09/17 1348 Last data filed at 03/09/17 0934  Gross per 24 hour  Intake           553.83 ml  Output             3200 ml  Net         -2646.17 ml   Filed Weights   03/07/17 1537 03/09/17 0534  Weight: 94.3 kg (208 lb) 92.4 kg (203 lb 9.6 oz)    Exam:   General:  No distress  Cardiovascular: s1,s2 rrr  Respiratory: no wheezing   Abdomen: soft, nt  Musculoskeletal: no pedal edema    Data Reviewed: Basic Metabolic Panel:  Recent Labs Lab 03/07/17 1602 03/08/17 0753 03/09/17 0804  NA 142 143 141  K 3.2* 3.0* 3.6  CL 102 102 101  CO2 28 31 32  GLUCOSE 124* 154* 148*  BUN 14 15 11   CREATININE 1.60* 1.51* 1.32*  CALCIUM 9.2 9.0 9.0   Liver Function  Tests:  Recent Labs Lab 03/07/17 1602  AST 25  ALT 17  ALKPHOS 109  BILITOT 0.3  PROT 7.1  ALBUMIN 2.8*   No results for input(s): LIPASE, AMYLASE in the last 168 hours. No results for input(s): AMMONIA in the last 168 hours. CBC:  Recent Labs Lab 03/07/17 1602 03/08/17 0753  WBC 11.8* 11.0*  NEUTROABS 8.8*  --   HGB 9.7* 9.3*  HCT 30.1* 28.8*  MCV 89.1 89.2  PLT 536* 540*   Cardiac Enzymes: No results for input(s): CKTOTAL, CKMB, CKMBINDEX, TROPONINI in the last 168 hours. BNP (last 3 results) No results for input(s): BNP in the last 8760 hours.  ProBNP (last 3 results) No results for input(s): PROBNP in the last 8760 hours.  CBG:  Recent Labs Lab 03/08/17 1133 03/08/17 1700 03/08/17 2120 03/09/17 0750 03/09/17 1153  GLUCAP 232* 186* 242* 142* 305*    Recent Results (from the past 240 hour(s))  Blood Culture (routine x 2)     Status: None (Preliminary result)   Collection Time: 03/07/17  4:07 PM  Result Value Ref Range Status   Specimen Description BLOOD RIGHT ANTECUBITAL  Final   Special Requests   Final    BOTTLES DRAWN AEROBIC AND ANAEROBIC Blood Culture adequate volume   Culture   Final    NO GROWTH < 24 HOURS Performed at [facility], [place] [address] [place], [place] [address]    Report Status PENDING  Incomplete  Blood Culture (routine x 2)     Status: None (Preliminary result)   Collection Time: 03/07/17  4:42 PM  Result Value Ref Range Status   Specimen Description BLOOD LEFT HAND  Final   Special Requests   Final    BOTTLES DRAWN AEROBIC AND ANAEROBIC Blood Culture adequate volume   Culture   Final    NO GROWTH < 24 HOURS Performed at [facility], [place] [address], [place], [place] [address]    Report Status PENDING  Incomplete  Urine culture     Status: None   Collection Time: 03/07/17  5:44 PM  Result Value Ref Range Status   Specimen Description URINE, CATHETERIZED  Final   Special Requests NONE  Final   Culture    Final    NO GROWTH Performed at [facility], [address] [place], [place] [address]    Report Status 03/09/2017 FINAL  Final  MRSA PCR Screening     Status: None   Collection Time: 03/08/17  1:50 AM  Result Value Ref Range Status   MRSA by PCR NEGATIVE NEGATIVE Final    Comment:        The GeneXpert MRSA Assay (FDA approved for NASAL specimens only), is one component of a comprehensive MRSA colonization surveillance program. It is not intended to diagnose MRSA infection nor to guide or monitor treatment for MRSA infections.      Studies: Dg Chest 2 View  Result Date: 03/07/2017 CLINICAL DATA:  Altered mental status and fever past 12  hours. EXAM: CHEST  2 VIEW COMPARISON:  02/22/2017 and 04/11/2010 FINDINGS: Lungs are adequately inflated without airspace consolidation or effusion. Mild stable cardiomegaly. Minimal calcified plaque over the thoracic aorta. There mild degenerate changes of the spine. IMPRESSION: No acute cardiopulmonary disease. Mild stable cardiomegaly. Aortic Atherosclerosis (ICD10-I70.0). Electronically Signed   By: Marin Olp M.D.   On: 03/07/2017 16:46   Ct Head Wo Contrast  Result Date: 03/07/2017 CLINICAL DATA:  Altered mental status and fever. EXAM: CT HEAD WITHOUT CONTRAST TECHNIQUE: Contiguous axial images were obtained from the base of the skull through the vertex without intravenous contrast. COMPARISON:  02/11/2010 FINDINGS: Brain: Ventricles and cisterns are within normal. There is mild chronic ischemic microvascular disease present. Encephalomalacia over the left temporal region likely postsurgical. No mass, mass effect, shift midline structures or acute hemorrhage. No evidence of acute infarction. Vascular: No hyperdense vessel or unexpected calcification. Skull: Previous left temporal craniotomy.  No acute fracture. Sinuses/Orbits: No acute finding. Other: None. IMPRESSION: No acute intracranial findings. Mild chronic ischemic microvascular  disease. Postsurgical change over the left temporal region. Electronically Signed   By: Marin Olp M.D.   On: 03/07/2017 16:38   Nm Pulmonary Perf And Vent  Result Date: 03/08/2017 CLINICAL DATA:  76 year old male with shortness of breath and chest pain. EXAM: NUCLEAR MEDICINE VENTILATION - PERFUSION LUNG SCAN TECHNIQUE: Ventilation images were obtained in multiple projections using inhaled aerosol Tc-73m DTPA. Perfusion images were obtained in multiple projections after intravenous injection of Tc-42m MAA. RADIOPHARMACEUTICALS:  31.7 mCi Technetium-95m DTPA aerosol inhalation and 5.0 mCi Technetium-48m MAA IV COMPARISON:  03/07/2017 and prior chest radiographs. FINDINGS: Ventilation: No focal ventilation defects identified. Mild central clumping of DTPA is noted. Perfusion: No wedge shaped peripheral perfusion defects to suggest acute pulmonary embolism. IMPRESSION: Normal exam.  No evidence of pulmonary emboli. Electronically Signed   By: Margarette Canada M.D.   On: 03/08/2017 15:50   Dg Knee Complete 4 Views Left  Result Date: 03/07/2017 CLINICAL DATA:  Left anterior knee pain when moving. EXAM: LEFT KNEE - COMPLETE 4+ VIEW COMPARISON:  None. FINDINGS: Osteopenia. Moderate tricompartmental osteoarthritic change worse over the medial compartment. Small joint effusion. No acute fracture or dislocation. IMPRESSION: No acute findings. 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 Small joint effusion. Electronically Signed   By: Marin Olp M.D.   On: 03/07/2017 16:47    Scheduled Meds: . amLODipine  10 mg Oral Daily  . azithromycin  250 mg Oral Daily  . bethanechol  25 mg Oral TID  . diclofenac sodium  2 g Topical QID  . gabapentin  100 mg Oral TID  . heparin  5,000 Units Subcutaneous Q8H  . insulin aspart  0-9 Units Subcutaneous TID WC  . insulin detemir  10 Units Subcutaneous BID  . lisinopril  20 mg Oral Daily  . methocarbamol  750 mg Oral TID  . pantoprazole   40 mg Oral Daily  . polyethylene glycol  17 g Oral TID WC  . pravastatin  10 mg Oral QPM  . senna  1 tablet Oral BID  . sodium chloride flush  3 mL Intravenous Q12H  . tamsulosin  0.4 mg Oral BID   Continuous Infusions: . sodium chloride 250 mL (03/08/17 0037)  . cefTRIAXone (ROCEPHIN)  IV 1 g (03/08/17 2148)    Principal Problem:   E coli UTI (urinary tract infection) Active Problems:   Acute metabolic encephalopathy   Encephalopathy    Time spent: >35 minutes  Kinnie Feil  [facility] Pager [phone]. If 7PM-7AM, please contact night-coverage at [email or web address], password [facility] 03/09/2017, 1:48 PM  LOS: 2 days      Addendum: catheter associated UTI [facility]

## 2017-03-09 NOTE — Progress Notes (Signed)
CSW following for patient discharge needs. Patient was recently at [facility] and seen by CSW on 9/17 for complete assessment and FL2 work up and discharged to [facility] and then sent to [facility] for UTI. The patient and family report the patient is not receiving "good care" there and prefer another facility. CSW met with patient at bedside, he gave CSW permission to talk with her son about other placement options. CSW discuss D/C plan with pt. Son Michael, the family and pt. agreeable to  [facility] close to his home. CSW contacted facility and provided clinical documentation, PT note Pending. The facility accepted.   Patient will need prior [employer] Medicare insurance authorization before discharging.  PT note Pending.    , LCSWA, MSW Clinical Social Worker 5E and Psychiatric Service Line [phone] 03/09/2017  3:49 PM  

## 2017-03-10 DIAGNOSIS — Z4789 Encounter for other orthopedic aftercare: Secondary | ICD-10-CM

## 2017-03-10 DIAGNOSIS — R339 Retention of urine, unspecified: Secondary | ICD-10-CM

## 2017-03-10 DIAGNOSIS — L8990 Pressure ulcer of unspecified site, unspecified stage: Secondary | ICD-10-CM | POA: Insufficient documentation

## 2017-03-10 DIAGNOSIS — I10 Essential (primary) hypertension: Secondary | ICD-10-CM

## 2017-03-10 DIAGNOSIS — N390 Urinary tract infection, site not specified: Secondary | ICD-10-CM

## 2017-03-10 DIAGNOSIS — Z794 Long term (current) use of insulin: Secondary | ICD-10-CM

## 2017-03-10 DIAGNOSIS — Z981 Arthrodesis status: Secondary | ICD-10-CM

## 2017-03-10 DIAGNOSIS — M48061 Spinal stenosis, lumbar region without neurogenic claudication: Secondary | ICD-10-CM

## 2017-03-10 DIAGNOSIS — E1169 Type 2 diabetes mellitus with other specified complication: Secondary | ICD-10-CM

## 2017-03-10 DIAGNOSIS — E669 Obesity, unspecified: Secondary | ICD-10-CM

## 2017-03-10 DIAGNOSIS — Z87891 Personal history of nicotine dependence: Secondary | ICD-10-CM

## 2017-03-10 LAB — URINE CULTURE
Culture: NO GROWTH
SPECIAL REQUESTS: NORMAL

## 2017-03-10 LAB — GLUCOSE, CAPILLARY
GLUCOSE-CAPILLARY: 150 mg/dL — AB (ref 65–99)
GLUCOSE-CAPILLARY: 153 mg/dL — AB (ref 65–99)
GLUCOSE-CAPILLARY: 202 mg/dL — AB (ref 65–99)
Glucose-Capillary: 260 mg/dL — ABNORMAL HIGH (ref 65–99)

## 2017-03-10 NOTE — Progress Notes (Addendum)
[facility]  PROGRESS NOTE  HALSEY HAMMEN QBH:[id number] DOB: [birth year] DOA: 03/07/2017   PCP: [name], NP  Brief summary  76 y.o. male,  With pmhx relevant for HTN, DM type II, alcohol abuse, and chronic back pain and recent Escherichia coli bacteremia and UTI who presented from nursing home with concerns of altered mentation/confusional episodes.  Patient's son and daughter are concerned that this was a similar presentation he had last time he had Escherichia coli UTI.   Assessment/Plan:  Metabolic encephalopathy- suspect secondary to persistent UTI, cultures (blood and urine) from recent admission (910/18 thru 03/01/17) grew pansensitive Escherichia coli, according to family members repeat culture at the nursing home recently grew Escherichia coli as well she has an indwelling Foley catheter - improving overall - CT head without acute findings, chest x-ray without acute findings, left knee x-rays with DJD otherwise no acute findings - continue Rocephin IV   Back Pain- status post lumbar surgery in August 2018 (01/21/17)  - will need SNF on discharge   Sacral area skin injury  - WOC team consulted   HTN- BP is elevated, restarted lisinopril 20 daily, amlodipine 10 mg daily,    - BP overall improving - ok to use hydralazine as needed   DM-restarted Lantus, Use Novolog/Humalog  - continue SSI as well   Acute kidney injury  - improving - BMP in AM  Hypokalemia. - supplemented   Pleuritic chest pain. No hypoxia, no acute SOB. VQ scan: low prob.  - complete Zithromax   Dispo: SNF in 1-2 days   Code Status: full Family Communication: d/w patient, his wife.  (indicate person spoken with, relationship, and if by phone, the number) Disposition Plan: SNF in 1-2 days    Consultants:  none  Procedures:  VQ  Antibiotics:  Ceftriaxone 9/24>>  Azithromycin 9/24>>  HPI/Subjective: Pt reports feeling better this AM   Objective: Vitals:   03/10/17  0517 03/10/17 1410  BP: (!) 141/76 136/69  Pulse: 79 92  Resp: 16 18  Temp: 98.6 F (37 C) 98.2 F (36.8 C)  SpO2: 99% 100%    Intake/Output Summary (Last 24 hours) at 03/10/17 1847 Last data filed at 03/10/17 1823  Gross per 24 hour  Intake              120 ml  Output             2625 ml  Net            -2505 ml   Filed Weights   03/07/17 1537 03/09/17 0534  Weight: 94.3 kg (208 lb) 92.4 kg (203 lb 9.6 oz)   Physical Exam  Constitutional: Appears calm, NAD CVS: RRR, S1/S2 +,  no gallops, no carotid bruit.  Pulmonary: Effort and breath sounds normal, no stridor  Abdominal: Soft. BS +,  no distension, tenderness, rebound or guarding.   Data Reviewed: Basic Metabolic Panel:  Recent Labs Lab 03/07/17 1602 03/08/17 0753 03/09/17 0804  NA 142 143 141  K 3.2* 3.0* 3.6  CL 102 102 101  CO2 28 31 32  GLUCOSE 124* 154* 148*  BUN 14 15 11   CREATININE 1.60* 1.51* 1.32*  CALCIUM 9.2 9.0 9.0   Liver Function Tests:  Recent Labs Lab 03/07/17 1602  AST 25  ALT 17  ALKPHOS 109  BILITOT 0.3  PROT 7.1  ALBUMIN 2.8*   CBC:  Recent Labs Lab 03/07/17 1602 03/08/17 0753  WBC 11.8* 11.0*  NEUTROABS 8.8*  --  HGB 9.7* 9.3*  HCT 30.1* 28.8*  MCV 89.1 89.2  PLT 536* 540*   CBG:  Recent Labs Lab 03/09/17 1659 03/09/17 2152 03/10/17 0723 03/10/17 1134 03/10/17 1635  GLUCAP 151* 167* 150* 260* 153*    Recent Results (from the past 240 hour(s))  Blood Culture (routine x 2)     Status: None (Preliminary result)   Collection Time: 03/07/17  4:07 PM  Result Value Ref Range Status   Specimen Description BLOOD RIGHT ANTECUBITAL  Final   Special Requests   Final    BOTTLES DRAWN AEROBIC AND ANAEROBIC Blood Culture adequate volume   Culture   Final    NO GROWTH 3 DAYS Performed at [facility], [place] [address] [place], [place] [address]    Report Status PENDING  Incomplete  Blood Culture (routine x 2)     Status: None (Preliminary result)   Collection  Time: 03/07/17  4:42 PM  Result Value Ref Range Status   Specimen Description BLOOD LEFT HAND  Final   Special Requests   Final    BOTTLES DRAWN AEROBIC AND ANAEROBIC Blood Culture adequate volume   Culture   Final    NO GROWTH 3 DAYS Performed at [facility], [place] [address] [place], [place] [address]    Report Status PENDING  Incomplete  Urine culture     Status: None   Collection Time: 03/07/17  5:44 PM  Result Value Ref Range Status   Specimen Description URINE, CATHETERIZED  Final   Special Requests NONE  Final   Culture   Final    NO GROWTH Performed at [facility], [address], [place], [place] [address]    Report Status 03/09/2017 FINAL  Final  Urine Culture     Status: None   Collection Time: 03/07/17 11:01 PM  Result Value Ref Range Status   Specimen Description URINE, CATHETERIZED  Final   Special Requests Normal  Final   Culture   Final    NO GROWTH Performed at [facility], [place] [address], [place], [place] [address]    Report Status 03/10/2017 FINAL  Final  MRSA PCR Screening     Status: None   Collection Time: 03/08/17  1:50 AM  Result Value Ref Range Status   MRSA by PCR NEGATIVE NEGATIVE Final    Comment:        The GeneXpert MRSA Assay (FDA approved for NASAL specimens only), is one component of a comprehensive MRSA colonization surveillance program. It is not intended to diagnose MRSA infection nor to guide or monitor treatment for MRSA infections.      Studies: No results found.  Scheduled Meds: . amLODipine  10 mg Oral Daily  . azithromycin  250 mg Oral Daily  . bethanechol  25 mg Oral TID  . diclofenac sodium  2 g Topical QID  . gabapentin  100 mg Oral TID  . heparin  5,000 Units Subcutaneous Q8H  . insulin aspart  0-9 Units Subcutaneous TID WC  . insulin detemir  10 Units Subcutaneous BID  . lisinopril  20 mg Oral Daily  . methocarbamol  750 mg Oral TID  . pantoprazole  40 mg Oral Daily  . pravastatin  10 mg  Oral QPM  . sodium chloride flush  3 mL Intravenous Q12H  . tamsulosin  0.4 mg Oral BID   Continuous Infusions: . sodium chloride 250 mL (03/08/17 0037)  . cefTRIAXone (ROCEPHIN)  IV Stopped (03/09/17  2215)    Principal Problem:   E coli UTI (urinary tract infection) Active Problems:   Acute metabolic encephalopathy   Encephalopathy   Pressure injury of skin   Time spent:25 minutes   [facility]  [facility] Pager [phone]. If 7PM-7AM, please contact night-coverage at [email or web address], password [facility] 03/10/2017, 6:47 PM  LOS: 3 days      Addendum: catheter associated UTI [facility]

## 2017-03-10 NOTE — Care Management Important Message (Signed)
Important Message  Patient Details  Name: JOHNTAVIOUS FRANCOM MRN: [id number] Date of Birth: December 15, 1940   Medicare Important Message Given:  Yes    Kerin Salen 03/10/2017, 10:05 AMImportant Message  Patient Details  Name: RAYDEL HOSICK MRN: [id number] Date of Birth: 18-Jan-1941   Medicare Important Message Given:  Yes    Kerin Salen 03/10/2017, 10:05 AM

## 2017-03-10 NOTE — Consult Note (Signed)
[facility] Nurse wound consult note Reason for Consult: sacral skin injury Wound type: MASD intertriginous  Pressure Injury POA: NA, was noted as pressure injury on admission Measurement:1.5c, x 0.2cm split in skin deep in gluteal cleft with MASD also on right buttock. Wound bed: 100% pink, no drainage or odor, area moist Pt asked if I could look at scrotum, he stated he is "raw" in the area. Intertriginous damage present, area moist. Drainage (amount, consistency, odor) see above Periwound:see above Dressing procedure/placement/frequency:I have provided nurses with orders for InterDry Ag+ with instructions for use for both areas of skin damage. Pt educated in how to use in case he is discharged and there is material left over he can take it home and continue using product. 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 Will order a mattress replacement with low air loss to help with moisture problem.  We will not follow, but will remain available to this patient, to nursing, and the medical and/or surgical teams.  Please re-consult if we need to assist further.   Fara Olden, RN-C, WTA-C Wound Treatment Associate

## 2017-03-10 NOTE — Evaluation (Signed)
Physical Therapy Evaluation Patient Details Name: Donald Calderon MRN: [id number] DOB: 1940-06-28 Today's Date: 03/10/2017   History of Present Illness  pt is a 76 y/o male with pmh significant for HTN, DM2, alcohol abuse, chronic back pain, (recent admission to CIR after lumbar exploration and decompresion at L3/4 and L 2/3, respectively with complication of L2 hematoma necessitating return to OR),  DC fro [facility] 9/17 to SNF with   Workin dx incl sepsis due to UTI.[name] Returns to Ed 9/23  with encephalopathy due to UTI.  Clinical Impression  The patient presents with weakness and impaired sensation of both legs. Pt admitted with above diagnosis. Pt currently with functional limitations due to the deficits listed below (see PT Problem List).  Pt will benefit from skilled PT to increase their independence and safety with mobility to allow discharge to the venue listed below.       Follow Up Recommendations SNF    Equipment Recommendations  None recommended by PT    Recommendations for Other Services       Precautions / Restrictions Precautions Precautions: Fall Required Braces or Orthoses: Spinal Brace Spinal Brace: Lumbar corset;Applied in sitting position Restrictions Weight Bearing Restrictions: No      Mobility  Bed Mobility Overal bed mobility: Needs Assistance Bed Mobility: Rolling;Sidelying to Sit Rolling: Min assist Sidelying to sit: Mod assist       General bed mobility comments: patient able to self assist to near sitting,  assist to complete sitting upright at the trunk  Transfers Overall transfer level: Needs assistance Equipment used: Rolling walker (2 wheeled) Transfers: Sit to/from [facility] Sit to Stand: Mod assist;+2 physical assistance;+2 safety/equipment Stand pivot transfers: Mod assist;Max assist;+2 physical assistance;+2 safety/equipment       General transfer comment: practiced standing x 2, feet close together,  noted decreased control  to reposition. , Trunk forward flexed, assist to power up to standing up.  Significant assist to steady and take steps to recliner, ataxia/decreased control of legs.   Ambulation/Gait                Stairs            Wheelchair Mobility    Modified Rankin (Stroke Patients Only)       Balance Overall balance assessment: History of Falls;Needs assistance Sitting-balance support: No upper extremity supported Sitting balance-Leahy Scale: Fair     Standing balance support: Bilateral upper extremity supported;During functional activity Standing balance-Leahy Scale: Poor                               Pertinent Vitals/Pain Faces Pain Scale: No hurt    Home Living Family/patient expects to be discharged to:: Skilled nursing facility Living Arrangements: Spouse/significant other Available Help at Discharge: Family Type of Home: House Home Access: Stairs to enter   [employer] of Steps: 2   Home Equipment: Environmental consultant - 2 wheels;Cane - single point;Shower seat;Grab bars - tub/shower;Grab bars - toilet;Wheelchair - manual      Prior Function Level of Independence: Needs assistance   Gait / Transfers Assistance Needed: limited ambulation since DC from SNF, uses WC           Hand Dominance        Extremity/Trunk Assessment   Upper Extremity Assessment Upper Extremity Assessment: Overall WFL for tasks assessed    Lower Extremity Assessment Lower Extremity Assessment: RLE deficits/detail;LLE deficits/detail RLE Deficits / Details: grossly 4/5  except df 2+,pf  2+ RLE Sensation: decreased light touch LLE Deficits / Details: hip flexion 3/5, quads 3+, hams 3/5, df 3/5, pf 3/5,     Cervical / Trunk Assessment Cervical / Trunk Assessment: Other exceptions Cervical / Trunk Exceptions: h/o back surgery  Communication      Cognition Arousal/Alertness: Awake/alert Behavior During Therapy: WFL for tasks assessed/performed Overall Cognitive  Status: Within Functional Limits for tasks assessed                                        General Comments      Exercises General Exercises - Lower Extremity Ankle Circles/Pumps: AROM;Both;10 reps Long Arc Quad: AROM;Both;10 reps;Seated Hip ABduction/ADduction: AROM;Both;10 reps;Seated Hip Flexion/Marching: AROM;Both;10 reps;Seated Toe Raises: AROM;Seated;Both;10 reps Heel Raises: AROM;Seated;Both;10 reps   Assessment/Plan    PT Assessment Patient needs continued PT services  PT Problem List Decreased strength;Decreased activity tolerance;Decreased balance;Decreased mobility;Decreased knowledge of use of DME;Pain       PT Treatment Interventions Gait training;DME instruction;Therapeutic activities;Balance training;Stair training;Functional mobility training;Patient/family education    PT Goals (Current goals can be found in the Care Plan section)  Acute Rehab PT Goals Patient Stated Goal: walk PT Goal Formulation: With patient Time For Goal Achievement: 03/24/17 Potential to Achieve Goals: Fair    Frequency Min 3X/week   Barriers to discharge Decreased caregiver support      Co-evaluation               AM-PAC PT "6 Clicks" Daily Activity  Outcome Measure Difficulty turning over in bed (including adjusting bedclothes, sheets and blankets)?: Unable Difficulty moving from lying on back to sitting on the side of the bed? : Unable Difficulty sitting down on and standing up from a chair with arms (e.g., wheelchair, bedside commode, etc,.)?: Unable Help needed moving to and from a bed to chair (including a wheelchair)?: Total Help needed walking in hospital room?: Total Help needed climbing 3-5 steps with a railing? : Total 6 Click Score: 6    End of Session Equipment Utilized During Treatment: Gait belt Activity Tolerance: Patient tolerated treatment well Patient left: in chair;with call bell/phone within reach;with chair alarm set Nurse  Communication: Mobility status PT Visit Diagnosis: Unsteadiness on feet (R26.81);Other abnormalities of gait and mobility (R26.89)    Time: 8325-4982 PT Time Calculation (min) (ACUTE ONLY): 39 min   Charges:   PT Evaluation $PT Eval Moderate Complexity: 1 Mod PT Treatments $Therapeutic Activity: 23-37 mins   PT G CodesTresa Endo PT [phone]   Claretha Cooper 03/10/2017, 11:46 AM

## 2017-03-10 NOTE — Progress Notes (Signed)
[employer] started. CSW updated patient and son.

## 2017-03-11 LAB — CBC
HCT: 26 % — ABNORMAL LOW (ref 39.0–52.0)
Hemoglobin: 8.5 g/dL — ABNORMAL LOW (ref 13.0–17.0)
MCH: 28.7 pg (ref 26.0–34.0)
MCHC: 32.7 g/dL (ref 30.0–36.0)
MCV: 87.8 fL (ref 78.0–100.0)
PLATELETS: 440 10*3/uL — AB (ref 150–400)
RBC: 2.96 MIL/uL — AB (ref 4.22–5.81)
RDW: 13.6 % (ref 11.5–15.5)
WBC: 8.5 10*3/uL (ref 4.0–10.5)

## 2017-03-11 LAB — GLUCOSE, CAPILLARY
GLUCOSE-CAPILLARY: 136 mg/dL — AB (ref 65–99)
GLUCOSE-CAPILLARY: 148 mg/dL — AB (ref 65–99)
Glucose-Capillary: 236 mg/dL — ABNORMAL HIGH (ref 65–99)
Glucose-Capillary: 187 mg/dL — ABNORMAL HIGH (ref 65–99)

## 2017-03-11 LAB — BASIC METABOLIC PANEL
ANION GAP: 9 (ref 5–15)
BUN: 16 mg/dL (ref 6–20)
CO2: 31 mmol/L (ref 22–32)
Calcium: 8.9 mg/dL (ref 8.9–10.3)
Chloride: 98 mmol/L — ABNORMAL LOW (ref 101–111)
Creatinine, Ser: 1.43 mg/dL — ABNORMAL HIGH (ref 0.61–1.24)
GFR calc Af Amer: 54 mL/min — ABNORMAL LOW (ref >60)
GFR, EST NON AFRICAN AMERICAN: 46 mL/min — AB (ref >60)
GLUCOSE: 147 mg/dL — AB (ref 65–99)
POTASSIUM: 3.2 mmol/L — AB (ref 3.5–5.1)
SODIUM: 138 mmol/L (ref 135–145)

## 2017-03-11 MED ORDER — POLYETHYLENE GLYCOL 3350 17 G PO PACK: 17.0000 g | PACK | Freq: Every day | ORAL | 0 refills | Status: DC | PRN

## 2017-03-11 MED ORDER — HYDROCODONE-ACETAMINOPHEN 10-325 MG PO TABS: 1.0000 | ORAL_TABLET | Freq: Two times a day (BID) | ORAL | 0 refills | Status: DC | PRN

## 2017-03-11 MED ORDER — ALBUTEROL SULFATE (2.5 MG/3ML) 0.083% IN NEBU: 2.5000 mg | INHALATION_SOLUTION | RESPIRATORY_TRACT | 12 refills | Status: DC | PRN

## 2017-03-11 MED ORDER — AZITHROMYCIN 250 MG PO TABS: 250.0000 mg | ORAL_TABLET | Freq: Every day | ORAL | 0 refills | Status: AC

## 2017-03-11 MED ORDER — POTASSIUM CHLORIDE CRYS ER 20 MEQ PO TBCR
40.0000 meq | EXTENDED_RELEASE_TABLET | Freq: Once | ORAL | Status: AC
  Administered 2017-03-11: 40 meq via ORAL
  Filled 2017-03-11: qty 2

## 2017-03-11 MED ORDER — TRAMADOL HCL 50 MG PO TABS: 50.0000 mg | ORAL_TABLET | Freq: Four times a day (QID) | ORAL | 0 refills | Status: DC | PRN

## 2017-03-11 NOTE — Progress Notes (Signed)
20:30  [facility]) called wanting an update on pt's time of arrival; states cannot accept pt after 21:30pm. Writer stated she will call and check status of arrival.  [facility] called and they state they did not have pt on list.  According to SW note and per report [facility] was called.  Called Elmyra Ricks back and updated on situation; will call [facility] in the morning and pt to be discharged in the morning.  Informed pt on situation and apologized.  Asked if I needed to call anyone and pt states he will call himself.

## 2017-03-11 NOTE — Discharge Summary (Addendum)
Physician Discharge Summary  Donald Calderon VWU:[id number] DOB: 09/09/1940 DOA: 03/07/2017  PCP: Donald Perna, NP  Admit date: 03/07/2017 Discharge date: 03/11/2017  Recommendations for Outpatient Follow-up:  1. Pt will need to follow up with PCP in 1 week post discharge 2. Please obtain BMP to evaluate electrolytes and kidney function, K level  3. Please also check CBC to evaluate Hg and Hct levels 4. Complete Zithromax for 4 more days  5. Foley placed due to urinary retention, please attempt voiding trial in 2-3 days.   Discharge Diagnoses:  Principal Problem:   E coli UTI (urinary tract infection) Active Problems:   Acute metabolic encephalopathy   Encephalopathy   Pressure injury of skin  Discharge Condition: Stable  Diet recommendation: Heart healthy diet discussed in details   History of present illness:  Patient is 76 year old male with known history of hypertension, diabetes, alcohol abuse, recent Escherichia coli UTI and bacteremia, presented from skilled nursing facility for altered mental status. Family explained that this is similar behavior when patient has urinary tract infection.  Assessment/Plan:  Metabolic encephalopathy - Initial thought was that altered mental status was caused by urinary tract infection however urine culture and blood culture on this admission with no growth - Patient has received 3 days of Rocephin and I think this is sufficient given no growth of bacteria on urine culture - I think that sedating medications including narcotics could have contributed to encephalopathy - Please note that CT head was without acute findings, chest x-ray without acute findings well - Patient's mental status is now at baseline and he stable for discharge skilled nursing facility  Back Pain- status post lumbar surgery in August 2018 (01/21/17)  - Plan to discharge skilled nursing facility, minimize narcotic medications as possible  Sacral area skin  injury, Right buttock area -moisture associated skin damage, not pressure injury   - 1 area in gluteal cleft with M ASVD also on right buttock area  HTN- BP is elevated, restarted lisinopril 20 daily, amlodipine 10 mg daily,   - Resume home medical regimen  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 -  supplemented prior to discharge  Pleuritic chest pain. No hypoxia, no acute SOB. VQ scan: low prob.  - complete Zithromax  treatment   Code Status: full Family Communication: pt updated  Disposition Plan: SNF    Consultants:  none  Procedures:  VQ  Antibiotics:  Ceftriaxone 9/24>> 9/27  Azithromycin 9/24>>  Procedures/Studies: Dg Chest 2 View  Result Date: 03/07/2017 CLINICAL DATA:  Altered mental status and fever past 12 hours. EXAM: CHEST  2 VIEW COMPARISON:  02/22/2017 and 04/11/2010 FINDINGS: Lungs are adequately inflated without airspace consolidation or effusion. Mild stable cardiomegaly. Minimal calcified plaque over the thoracic aorta. There mild degenerate changes of the spine. IMPRESSION: No acute cardiopulmonary disease. Mild stable cardiomegaly. Aortic Atherosclerosis (ICD10-I70.0). Electronically Signed   By: Marin Olp M.D.   On: 03/07/2017 16:46   Dg Knee 1-2 Views Left  Result Date: 02/28/2017 CLINICAL DATA:  Question recent injury/ dislocation.  Pain. EXAM: LEFT KNEE - 1-2 VIEW COMPARISON:  None. FINDINGS: Large knee joint effusion. No evidence of fracture or dislocation. Tricompartmental osteoarthritis is noted. IMPRESSION: Large joint effusion.  Osteoarthritis.  No acute  finding otherwise. Electronically Signed   By: Nelson Chimes M.D.   On: 02/28/2017 13:19   Dg Abd 1  View  Result Date: 02/27/2017 CLINICAL DATA:  Ileus. EXAM: ABDOMEN - 1 VIEW COMPARISON:  February 26, 2017 FINDINGS: Prominent air-filled loops of large and small bowel are similar in the interval most consistent with the ileus. No other interval changes or acute abnormalities. IMPRESSION: Stable bowel gas pattern most consistent with ileus. Electronically Signed   By: Dorise Bullion III M.D   On: 02/27/2017 10:25   Dg Abd 1 View  Result Date: 02/25/2017 CLINICAL DATA:  Abdominal pain.  Abdominal distention . EXAM: ABDOMEN - 1 VIEW COMPARISON:  Ultrasound 02/25/2017.  Abdominal series 02/22/2017 . FINDINGS: Distended loops of small and large bowel are noted suggesting adynamic ileus. No pathologic intra-abdominal calcifications noted. Prior lumbar spine fusion. IMPRESSION: Distended loops of small and large bowel noted suggesting adynamic ileus. Electronically Signed   By: Marcello Moores  Register   On: 02/25/2017 10:42   Dg Abd 1 View  Result Date: 02/22/2017 CLINICAL DATA:  Pain over the bladder. Patient thinks he has a urinary tract infection. Symptoms began 2 days ago with dark urine. EXAM: ABDOMEN - 1 VIEW COMPARISON:  None. FINDINGS: The bowel gas pattern is unremarkable. No radio-opaque calculi. L4-5 spinal fusion hardware is again noted. No evidence of pneumatosis or pneumoperitoneum. IMPRESSION: Stable appearance of the abdomen and pelvis. No bowel obstruction noted. Electronically Signed   By: Ashley Royalty M.D.   On: 02/22/2017 23:00   Ct Head Wo Contrast  Result Date: 03/07/2017 CLINICAL DATA:  Altered mental status and fever. EXAM: CT HEAD WITHOUT CONTRAST TECHNIQUE: Contiguous axial images were obtained from the base of the skull through the vertex without intravenous contrast. COMPARISON:  02/11/2010 FINDINGS: Brain: Ventricles and cisterns are within normal. There is mild chronic ischemic microvascular disease present. Encephalomalacia over the left temporal region likely postsurgical. No mass,  mass effect, shift midline structures or acute hemorrhage. No evidence of acute infarction. Vascular: No hyperdense vessel or unexpected calcification. Skull: Previous left temporal craniotomy.  No acute fracture. Sinuses/Orbits: No acute finding. Other: None. IMPRESSION: No acute intracranial findings. Mild chronic ischemic microvascular disease. Postsurgical change over the left temporal region. Electronically Signed   By: Marin Olp M.D.   On: 03/07/2017 16:38   US Renal  Result Date: 02/25/2017 CLINICAL DATA:  Lower abdominal and back pain. EXAM: RENAL / URINARY TRACT ULTRASOUND COMPLETE COMPARISON:  Renal ultrasound dated February 22, 2017. FINDINGS: Right Kidney: Length: 14.6 cm. Echogenicity within normal limits. No mass or hydronephrosis visualized. Left Kidney: Length: 12.3 cm. Echogenicity within normal limits. No mass or hydronephrosis visualized. Bladder: Decompressed by Foley catheter. IMPRESSION: Normal sonographic appearance of the bilateral kidneys. Electronically Signed   By: Titus Dubin M.D.   On: 02/25/2017 10:17   US Renal  Result Date: 02/22/2017 CLINICAL DATA:  Acute renal failure EXAM: RENAL / URINARY TRACT ULTRASOUND COMPLETE COMPARISON:  None. FINDINGS: Right Kidney: Length: 13.5 cm. Cortical-medullary distinction is maintained. No obstructive uropathy. No focal renal mass. No nephrolithiasis. Left Kidney: Length: 12.3 cm. Cortical-medullary distinction is maintained. No obstructive uropathy or focal renal mass. No nephrolithiasis. Bladder: Contracted with Foley catheter in place. Mural thickening may be due to contracted the decompressed appearance of the bladder. IMPRESSION: No sonographic findings for the patient's acute renal failure. Electronically Signed   By: Ashley Royalty M.D.   On: 02/22/2017 23:18   Nm Pulmonary Perf And Vent  Result Date:  03/08/2017 CLINICAL DATA:  76 year old male with shortness of breath and chest pain. EXAM: NUCLEAR MEDICINE VENTILATION -  PERFUSION LUNG SCAN TECHNIQUE: Ventilation images were obtained in multiple projections using inhaled aerosol Tc-73m DTPA. Perfusion images were obtained in multiple projections after intravenous injection of Tc-20m MAA. RADIOPHARMACEUTICALS:  31.7 mCi Technetium-23m DTPA aerosol inhalation and 5.0 mCi Technetium-63m MAA IV COMPARISON:  03/07/2017 and prior chest radiographs. FINDINGS: Ventilation: No focal ventilation defects identified. Mild central clumping of DTPA is noted. Perfusion: No wedge shaped peripheral perfusion defects to suggest acute pulmonary embolism. IMPRESSION: Normal exam.  No evidence of pulmonary emboli. Electronically Signed   By: Margarette Canada M.D.   On: 03/08/2017 15:50   Dg Chest Portable 1 View  Result Date: 02/22/2017 CLINICAL DATA:  76 y/o  M; either. EXAM: PORTABLE CHEST 1 VIEW COMPARISON:  04/11/2010 chest radiograph. FINDINGS: Stable normal cardiac silhouette given projection and technique. Low lung volumes accentuate pulmonary markings. Aortic atherosclerosis with calcification. Minor bibasilar atelectasis. No focal consolidation. No pleural effusion or pneumothorax. No acute osseous abnormality is evident. IMPRESSION: Low lung volumes. Minor bibasilar atelectasis. No focal consolidation. Electronically Signed   By: Kristine Garbe M.D.   On: 02/22/2017 18:07   Dg Knee Complete 4 Views Left  Result Date: 03/07/2017 CLINICAL DATA:  Left anterior knee pain when moving. EXAM: LEFT KNEE - COMPLETE 4+ VIEW COMPARISON:  None. FINDINGS: Osteopenia. Moderate tricompartmental osteoarthritic change worse over the medial compartment. Small joint effusion. No acute fracture or dislocation. IMPRESSION: No acute findings. 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 Small joint effusion. Electronically Signed   By: Marin Olp M.D.   On: 03/07/2017 16:47   Dg Abd Portable 1v  Result Date: 02/26/2017 CLINICAL DATA:  76 year old male with pain.  EXAM: PORTABLE ABDOMEN - 1 VIEW COMPARISON:  02/25/2017 FINDINGS: Dilated loops of small and large bowel are again noted diffusely throughout the abdomen. Evaluation for subtle free intraperitoneal air and air-fluid levels is limited on supine only radiographs. Stable surgical hardware in the lower lumbar spine. Visualized osseous structures grossly intact. IMPRESSION: Multiple distended loops of small and large bowel throughout the abdomen suggesting adynamic ileus. The appearance is similar to prior exam. Electronically Signed   By: Kristopher Oppenheim M.D.   On: 02/26/2017 14:23    Discharge Exam: Vitals:   03/10/17 2107 03/11/17 0523  BP: (!) 149/72 126/67  Pulse: 89 76  Resp: 18 18  Temp: 98.6 F (37 C) 98.3 F (36.8 C)  SpO2: 98% 100%   Vitals:   03/10/17 0517 03/10/17 1410 03/10/17 2107 03/11/17 0523  BP: (!) 141/76 136/69 (!) 149/72 126/67  Pulse: 79 92 89 76  Resp: 16 18 18 18   Temp: 98.6 F (37 C) 98.2 F (36.8 C) 98.6 F (37 C) 98.3 F (36.8 C)  TempSrc: Oral Oral Oral Oral  SpO2: 99% 100% 98% 100%  Weight:      Height:        General: Pt is alert, follows commands appropriately, not in acute distress Cardiovascular: Regular rate and rhythm, S1/S2 +, no murmurs, no rubs, no gallops Respiratory: Clear to auscultation bilaterally, no wheezing, no crackles, no rhonchi Abdominal: Soft, non tender, non distended, bowel sounds +, no guarding   Discharge Instructions  Discharge Instructions    Diet - low sodium heart healthy    Complete by:  As directed    Increase activity slowly    Complete by:  As directed      Allergies as  of 03/11/2017      Reactions   Penicillins Itching, Rash   Has patient had a PCN reaction causing immediate rash, facial/tongue/throat swelling, SOB or lightheadedness with hypotension: No Has patient had a PCN reaction causing severe rash involving mucus membranes or skin necrosis: No Has patient had a PCN reaction that required hospitalization:  No Has patient had a PCN reaction occurring within the last 10 years: No If all of the above answers are "NO", then may proceed with Cephalosporin use. Tolerated Ancef      Medication List    TAKE these medications   acetaminophen 325 MG tablet Commonly known as:  TYLENOL Take 1-2 tablets (325-650 mg total) by mouth every 4 (four) hours as needed for mild pain.   albuterol (2.5 MG/3ML) 0.083% nebulizer solution Commonly known as:  PROVENTIL Take 3 mLs (2.5 mg total) by nebulization every 2 (two) hours as needed for wheezing.   ALKA-SELTZER ANTACID PO Take 1 tablet by mouth as needed (heartburn).   amLODipine 10 MG tablet Commonly known as:  NORVASC Take 1 tablet (10 mg total) by mouth daily.   azithromycin 250 MG tablet Commonly known as:  ZITHROMAX Take 1 tablet (250 mg total) by mouth daily.   bethanechol 25 MG tablet Commonly known as:  URECHOLINE Take 1 tablet (25 mg total) by mouth 3 (three) times daily.   bisacodyl 5 MG EC tablet Commonly known as:  DULCOLAX Take 1 tablet (5 mg total) by mouth daily as needed for moderate constipation.   cyclobenzaprine 5 MG tablet Commonly known as:  FLEXERIL Take 1 tablet (5 mg total) by mouth 3 (three) times daily as needed for muscle spasms.   diclofenac sodium 1 % Gel Commonly known as:  VOLTAREN Apply 2 g topically 4 (four) times daily.   gabapentin 100 MG capsule Commonly known as:  NEURONTIN Take 1 capsule (100 mg total) by mouth 3 (three) times daily.   HYDROcodone-acetaminophen 10-325 MG tablet Commonly known as:  NORCO Take 1 tablet by mouth every 12 (twelve) hours as needed for severe pain. What changed:  how much to take   hydrocortisone 2.5 % rectal cream Commonly known as:  ANUSOL-HC Place rectally 4 (four) times daily. What changed:  how much to take  when to take this  reasons to take this   insulin aspart 100 UNIT/ML injection Commonly known as:  novoLOG Inject 4-6 Units into the skin 3 (three)  times daily before meals. Per sliding scale . If CBG>150 give 4 units, if CBG >200 give 6 units.   LEVEMIR 100 UNIT/ML injection Generic drug:  insulin detemir Inject 0.1-0.3 mLs (10-30 Units total) into the skin 2 (two) times daily. 10 units every morning, and 34 units every evening   lidocaine 2 % jelly Commonly known as:  XYLOCAINE Apply 1 application topically as needed. Use with in and out catheter   lisinopril 20 MG tablet Commonly known as:  PRINIVIL,ZESTRIL Take 20 mg by mouth daily.   pantoprazole 40 MG tablet Commonly known as:  PROTONIX Take 1 tablet (40 mg total) by mouth daily.   polyethylene glycol packet Commonly known as:  MIRALAX / GLYCOLAX Take 17 g by mouth daily as needed. What changed:  when to take this  reasons to take this   pravastatin 10 MG tablet Commonly known as:  PRAVACHOL Take 1 tablet (10 mg total) by mouth every evening.   tamsulosin 0.4 MG Caps capsule Commonly known as:  FLOMAX Take 1 capsule (0.4  mg total) by mouth 2 (two) times daily.   traMADol 50 MG tablet Commonly known as:  ULTRAM Take 1 tablet (50 mg total) by mouth every 6 (six) hours as needed for moderate pain.            Discharge Care Instructions        Start     Ordered   03/11/17 0000  albuterol (PROVENTIL) (2.5 MG/3ML) 0.083% nebulizer solution  Every 2 hours PRN     03/11/17 1236   03/11/17 0000  azithromycin (ZITHROMAX) 250 MG tablet  Daily     03/11/17 1236   03/11/17 0000  HYDROcodone-acetaminophen (NORCO) 10-325 MG tablet  Every 12 hours PRN     03/11/17 1236   03/11/17 0000  polyethylene glycol (MIRALAX / GLYCOLAX) packet  Daily PRN     03/11/17 1236   03/11/17 0000  traMADol (ULTRAM) 50 MG tablet  Every 6 hours PRN     03/11/17 1236   03/11/17 0000  Increase activity slowly     03/11/17 1236   03/11/17 0000  Diet - low sodium heart healthy     03/11/17 1236     Follow-up Information    Donald Perna, NP Follow up.   Specialty:  Internal  Medicine Contact information: [address] [place] [address] [phone]            The results of significant diagnostics from this hospitalization (including imaging, microbiology, ancillary and laboratory) are listed below for reference.     Microbiology: Recent Results (from the past 240 hour(s))  Blood Culture (routine x 2)     Status: None (Preliminary result)   Collection Time: 03/07/17  4:07 PM  Result Value Ref Range Status   Specimen Description BLOOD RIGHT ANTECUBITAL  Final   Special Requests   Final    BOTTLES DRAWN AEROBIC AND ANAEROBIC Blood Culture adequate volume   Culture   Final    NO GROWTH 3 DAYS Performed at [facility], [address] [place], [place] [address]    Report Status PENDING  Incomplete  Blood Culture (routine x 2)     Status: None (Preliminary result)   Collection Time: 03/07/17  4:42 PM  Result Value Ref Range Status   Specimen Description BLOOD LEFT HAND  Final   Special Requests   Final    BOTTLES DRAWN AEROBIC AND ANAEROBIC Blood Culture adequate volume   Culture   Final    NO GROWTH 3 DAYS Performed at [facility], [place] [address] [place], [place] [address]    Report Status PENDING  Incomplete  Urine culture     Status: None   Collection Time: 03/07/17  5:44 PM  Result Value Ref Range Status   Specimen Description URINE, CATHETERIZED  Final   Special Requests NONE  Final   Culture   Final    NO GROWTH Performed at [facility], [address] [place], [place] [address]    Report Status 03/09/2017 FINAL  Final  Urine Culture     Status: None   Collection Time: 03/07/17 11:01 PM  Result Value Ref Range Status   Specimen Description URINE, CATHETERIZED  Final   Special Requests Normal  Final   Culture   Final    NO GROWTH Performed at [facility], [place] [address] [place], [place] [address]    Report Status 03/10/2017 FINAL  Final  MRSA PCR Screening  Status: None    Collection Time: 03/08/17  1:50 AM  Result Value Ref Range Status   MRSA by PCR NEGATIVE NEGATIVE Final    Comment:        The GeneXpert MRSA Assay (FDA approved for NASAL specimens only), is one component of a comprehensive MRSA colonization surveillance program. It is not intended to diagnose MRSA infection nor to guide or monitor treatment for MRSA infections.      Labs: Basic Metabolic Panel:  Recent Labs Lab 03/07/17 1602 03/08/17 0753 03/09/17 0804 03/11/17 0649  NA 142 143 141 138  K 3.2* 3.0* 3.6 3.2*  CL 102 102 101 98*  CO2 28 31 32 31  GLUCOSE 124* 154* 148* 147*  BUN 14 15 11 16   CREATININE 1.60* 1.51* 1.32* 1.43*  CALCIUM 9.2 9.0 9.0 8.9   Liver Function Tests:  Recent Labs Lab 03/07/17 1602  AST 25  ALT 17  ALKPHOS 109  BILITOT 0.3  PROT 7.1  ALBUMIN 2.8*   CBC:  Recent Labs Lab 03/07/17 1602 03/08/17 0753 03/11/17 0649  WBC 11.8* 11.0* 8.5  NEUTROABS 8.8*  --   --   HGB 9.7* 9.3* 8.5*  HCT 30.1* 28.8* 26.0*  MCV 89.1 89.2 87.8  PLT 536* 540* 440*    CBG:  Recent Labs Lab 03/10/17 1134 03/10/17 1635 03/10/17 2104 03/11/17 0735 03/11/17 1134  GLUCAP 260* 153* 202* 136* 236*   SIGNED Time coordinating discharge: 60 minutes  Faye Ramsay, MD  [facility] 03/11/2017, 12:37 PM Pager [phone]  If 7PM-7AM, please contact night-coverage [email or web address] Password [email or web address]

## 2017-03-11 NOTE — Discharge Instructions (Signed)
Delirium Delirium is a state of mental confusion. It comes on quickly and causes significant changes in a person's thinking and behavior. People with delirium usually have trouble paying attention to what is going on or knowing where they are. They may become very withdrawn or very emotional and unable to sit still. They may even see or feel things that are not there (hallucinations). Delirium is a sign of a serious underlying medical condition. What are the causes? Delirium occurs when something suddenly affects the signals that the brain sends out. Brain signals can be affected by anything that puts severe stress on the body and brain and causes brain chemicals to be out of balance. The most common causes of delirium include:  Infections. These may be bacterial, viral, fungal, or protozoal.  Medicines. These include many over-the-counter and prescription medicines.  Recreational drugs.  Substance withdrawal. This occurs with sudden discontinuation of alcohol, certain medicines, or recreational drugs.  Surgery.  Sudden vascular events, such as stroke, brain hemorrhage, and severe migraine.  Other brain disorders, such as tumors, seizures, and physical head trauma.  Metabolic disorders, such as kidney or liver failure.  Low blood oxygen (anoxia). This may occur with lung disease, cardiac arrest, or carbon monoxide poisoning.  Hormone imbalances (endocrinopathies), such as an overactive thyroid (hyperthyroidism) or underactive thyroid (hypothyroidism).  Vitamin deficiencies.  What increases the risk? This condition is more likely to develop in:  Children.  Older people.  People who live alone.  People who have vision loss or hearing loss.  People who have existing brain disease, such as dementia.  People who have long-lasting (chronic) medical conditions, such as heart disease.  People who are hospitalized for long periods of time.  What are the signs or symptoms? Delirium  starts with a sudden change in a person's thinking or behavior. Symptoms come and go (fluctuate) over time, and they are often worse at the end of the day. Symptoms include:  Not being able to stay awake (drowsiness) or pay attention.  Being confused about places, time, and people.  Forgetfulness.  Having extreme energy levels. These may be low or high.  Changes in sleep patterns.  Extreme mood swings, such as anger or anxiety.  Focusing on things or ideas that are not important.  Rambling and senseless talking.  Difficulty speaking, understanding speech, or both.  Hallucinations.  Tremor or unsteady gait.  How is this diagnosed? People with delirium may not realize that they have the condition. Often, a family member or health care provider is the first person to notice the changes. The health care provider will obtain a detailed history of current symptoms, medical issues, medicines, and recreational drug use. The health care provider will perform a mental status examination by:  Asking questions to check for confusion.  Watching for abnormal behavior.  The health care provider may perform a physical exam and order lab tests or additional studies to determine the cause of the delirium. How is this treated? Treatment of delirium depends on the cause and severity. Delirium usually goes away within days or weeks of treating the underlying cause. In the meantime, the person should not be left alone because he or she may accidentally cause self-harm. Treatment includes supportive care, such as:  Increased light during the day and decreased light at night.  Low noise level.  Uninterrupted sleep.  A regular daily schedule.  Clocks and calendars to help with orientation.  Familiar objects, including the person's pictures and clothing.  Frequent visits   from familiar family and friends.  Healthy diet.  Exercise.  In more severe cases of delirium, medicine may be  prescribed to help the person to keep calm and think more clearly. Follow these instructions at home:  Any supportive care should be continued as told by the health care provider.  All medicines should be used as told by the health care provider. This is important.  The health care provider should be consulted before over-the-counter medicines, herbs, or supplements are used.  All follow-up visits should be kept as told by the health care provider. This is important.  Alcohol and recreational drugs should be avoided as told by the health care provider. Contact a health care provider if:  Symptoms do not get better or they become worse.  New symptoms of delirium develop.  Caring for the person at home does not seem safe.  Eating, drinking, or communicating stops.  There are side effects of medicines, such as changes in sleep patterns, dizziness, weight gain, restlessness, movement changes, or tremors. Get help right away if:  Serious thoughts occur about self-harm or about hurting others.  There are serious side effects of medicine, such as: ? Swelling of the face, lips, tongue, or throat. ? Fever, confusion, muscle spasms, or seizures. This information is not intended to replace advice given to you by your health care provider. Make sure you discuss any questions you have with your health care provider. Document Released: 02/24/2012 Document Revised: 11/07/2015 Document Reviewed: 07/25/2014 Elsevier Interactive Patient Education  2018 [employer]  

## 2017-03-11 NOTE — Progress Notes (Signed)
Report called to nurse laurie at [facility].

## 2017-03-11 NOTE — Clinical Social Work Placement (Addendum)
D/C summary faxed. Transport packet complete, nurse given number for report.  Patient will transport by [facility]. [facility] called.  Patient son to complete admission paperwork now.  CLINICAL SOCIAL WORK PLACEMENT  NOTE  Date:  03/11/2017  Patient Details  Name: Donald Calderon MRN: [id number] Date of Birth: [birth year]  Clinical Social Work is seeking post-discharge placement for this patient at the [place] level of care (*CSW will initial, date and re-position this form in  chart as items are completed):  Yes   Patient/family provided with [place] Work Department's list of facilities offering this level of care within the geographic area requested by the patient (or if unable, by the patient's family).  Yes   Patient/family informed of their freedom to choose among providers that offer the needed level of care, that participate in Medicare, Medicaid or managed care program needed by the patient, have an available bed and are willing to accept the patient.  Yes   Patient/family informed of [place]'s ownership interest in [facility] and [facility], as well as of the fact that they are under no obligation to receive care at these facilities.  PASRR submitted to EDS on       PASRR number received on       Existing PASRR number confirmed on 03/11/17     FL2 transmitted to all facilities in geographic area requested by pt/family on       FL2 transmitted to all facilities within larger geographic area on 03/11/17     Patient informed that his/her managed care company has contracts with or will negotiate with certain facilities, including the following:  [facility]     Yes   Patient/family informed of bed offers received.  Patient chooses bed at [facility]     Physician recommends and patient chooses bed at      Patient to be transferred to [facility] on 03/11/17.  Patient to be transferred to  facility by [facility]      Patient family notified on 03/11/17 of transfer.  Name of family member notified:  Daughter/Son     PHYSICIAN       Additional Comment:    _______________________________________________ Lia Hopping, LCSW 03/11/2017, 1:25 PM

## 2017-03-11 NOTE — Progress Notes (Signed)
March 11, 2017 Chart and discharge orders researched for Case Management needs. None found and patient discharged to appropriate level of care. Patient and family have no further questions. Velva Harman, BSN, [place], [place].

## 2017-03-12 LAB — CULTURE, BLOOD (ROUTINE X 2)
CULTURE: NO GROWTH
Culture: NO GROWTH
Special Requests: ADEQUATE
Special Requests: ADEQUATE

## 2017-03-12 LAB — GLUCOSE, CAPILLARY: GLUCOSE-CAPILLARY: 124 mg/dL — AB (ref 65–99)

## 2017-03-12 NOTE — Progress Notes (Signed)
Report called to [facility] to update on pt status.

## 2017-03-12 NOTE — Progress Notes (Addendum)
CSW contacted Neoma Laming at [facility], confirmed facility is ready for patient. [facility] called for transport. Patient son informed.  No other needs identified at this time.   Kathrin Greathouse, Latanya Presser, MSW Clinical Social Worker 5E and Psychiatric Service Line [phone] 03/12/2017  8:20 AM

## 2017-03-16 ENCOUNTER — Encounter: Payer: Medicare HMO | Attending: Physical Medicine & Rehabilitation | Admitting: Physical Medicine & Rehabilitation

## 2017-03-27 ENCOUNTER — Other Ambulatory Visit
Admission: RE | Admit: 2017-03-27 | Discharge: 2017-03-27 | Disposition: A | Discharge status: Home / Self Care | Payer: Medicare HMO | Source: Ambulatory Visit | Attending: Family Medicine | Admitting: Family Medicine

## 2017-03-27 DIAGNOSIS — B9620 Unspecified Escherichia coli [E. coli] as the cause of diseases classified elsewhere: Secondary | ICD-10-CM | POA: Diagnosis present

## 2017-03-27 DIAGNOSIS — N390 Urinary tract infection, site not specified: Secondary | ICD-10-CM | POA: Insufficient documentation

## 2017-03-27 DIAGNOSIS — E1121 Type 2 diabetes mellitus with diabetic nephropathy: Secondary | ICD-10-CM | POA: Diagnosis present

## 2017-03-27 DIAGNOSIS — G9341 Metabolic encephalopathy: Secondary | ICD-10-CM | POA: Diagnosis present

## 2017-03-27 LAB — C DIFFICILE QUICK SCREEN W PCR REFLEX
C DIFFICILE (CDIFF) INTERP: NOT DETECTED
C Diff antigen: NEGATIVE
C Diff toxin: NEGATIVE

## 2017-04-05 LAB — OVA + PARASITE EXAM

## 2017-04-05 LAB — O&P RESULT

## 2018-06-07 IMAGING — CT CT HEAD W/O CM
3 of 4 series · 14 of 47 positions shown, 16 images · non-contrast
Comparison: 02/11/2010

CLINICAL DATA: Altered mental status and fever.

EXAM:
CT HEAD WITHOUT CONTRAST
TECHNIQUE: Contiguous axial images were obtained from the base of the skull
through the vertex without intravenous contrast.

[Series 2: head w/o · axial · non-contrast · 0.48mm/px · z∈[+1550,+1680]mm · 8 of 32 slices shown, 10 images]
[im 3/32  brain]
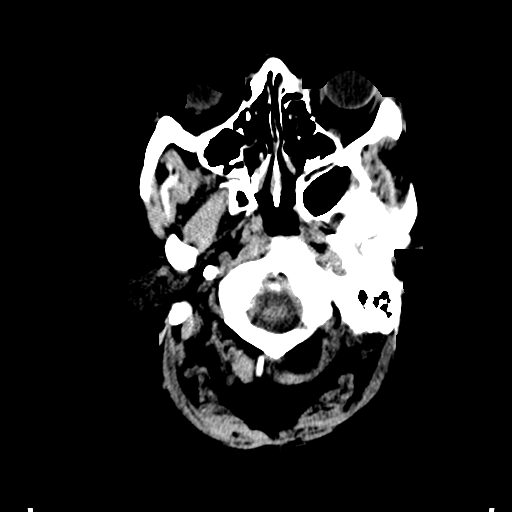
[im 3/32  bone]
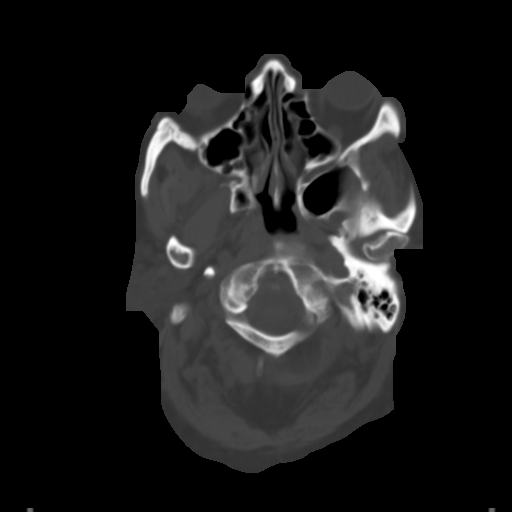
[im 7/32  brain]
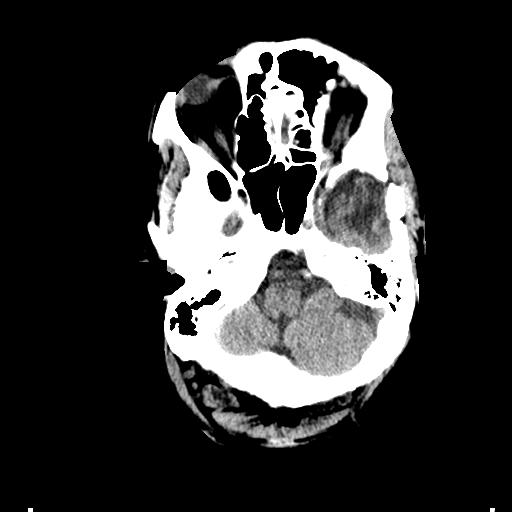
[im 12/32  brain]
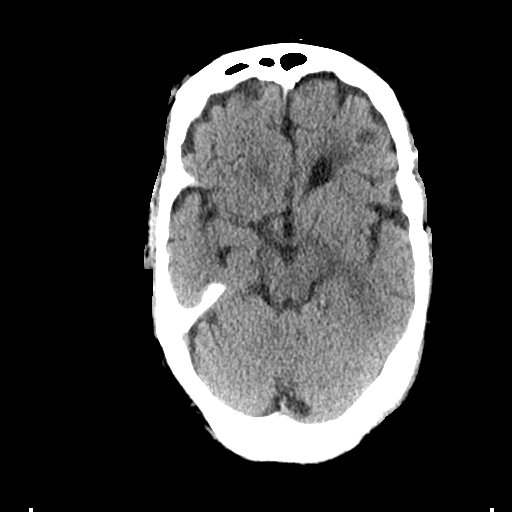
[im 14/32  brain]
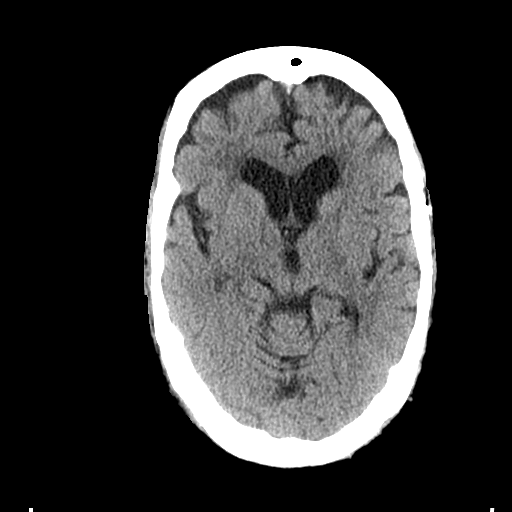
[im 18/32  brain]
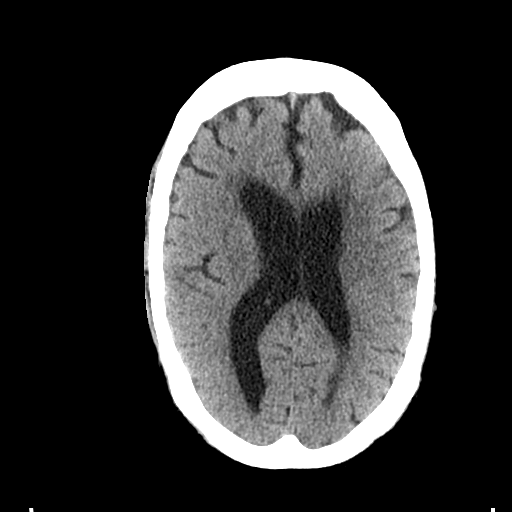
[im 18/32  bone]
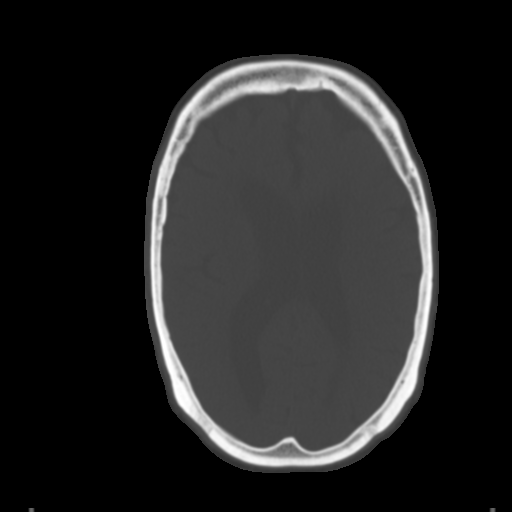
[im 20/32  brain]
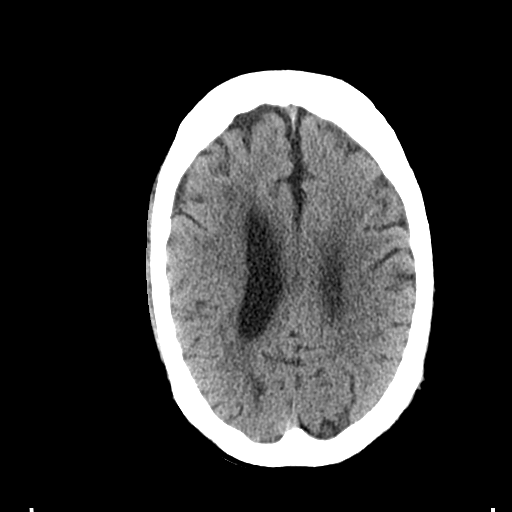
[im 25/32  brain]
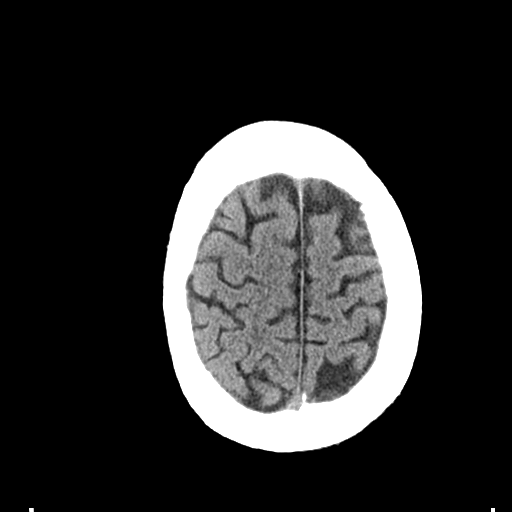
[im 29/32  brain]
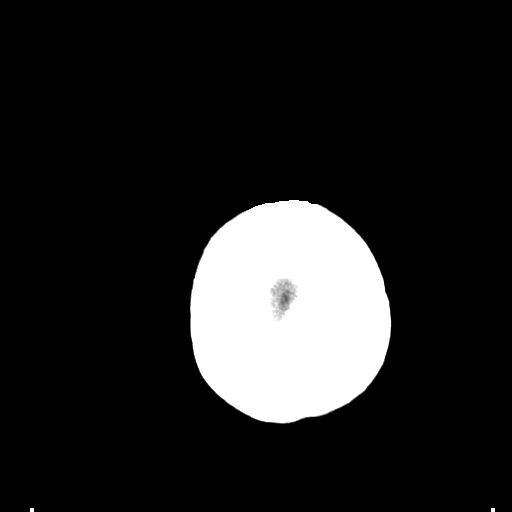

[Series 4: coronal · coronal · 0.30mm/px · 3 of 77 slices shown]
[im 26/77  brain]
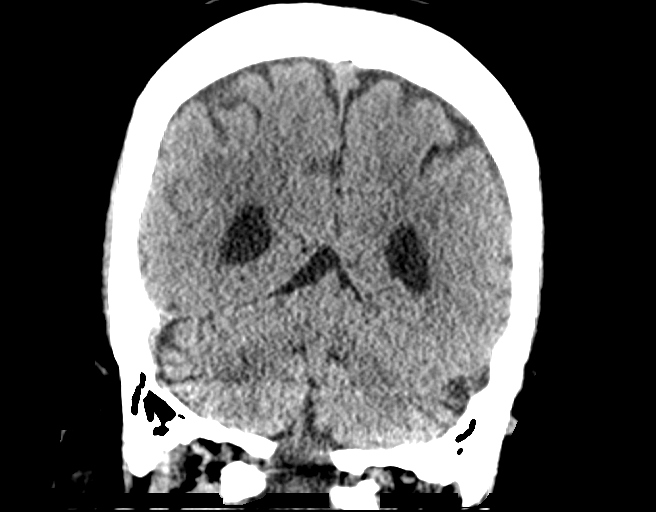
[im 34/77  brain]
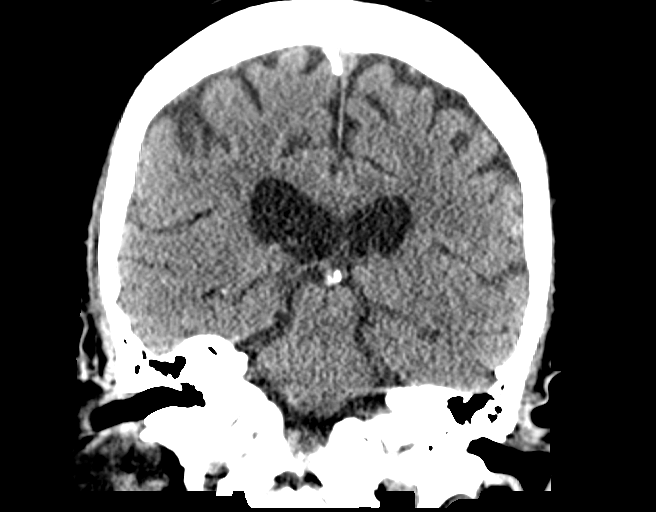
[im 43/77  brain]
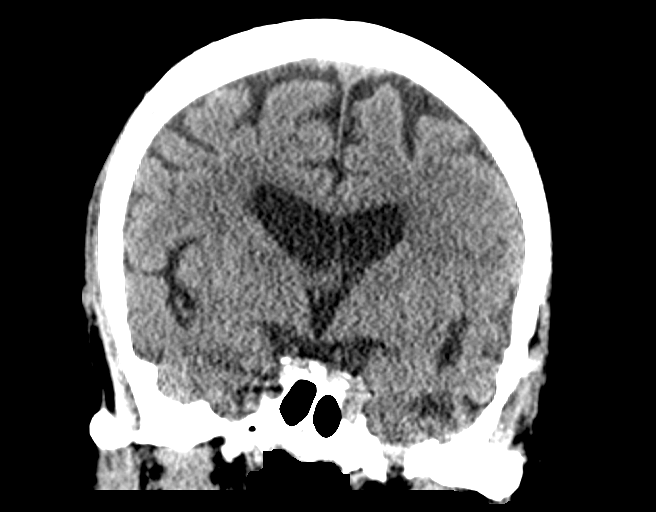

[Series 5: sagittal · sagittal · 0.30mm/px · 3 of 73 slices shown]
[im 25/73  brain]
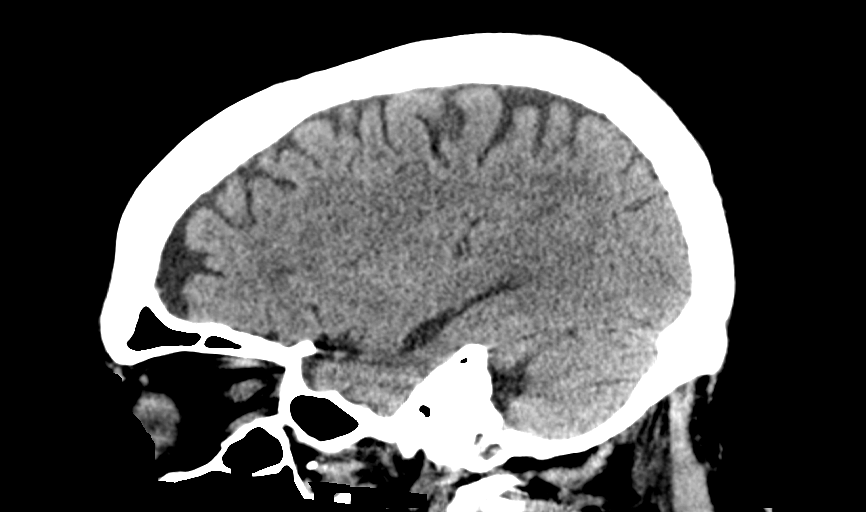
[im 37/73  brain]
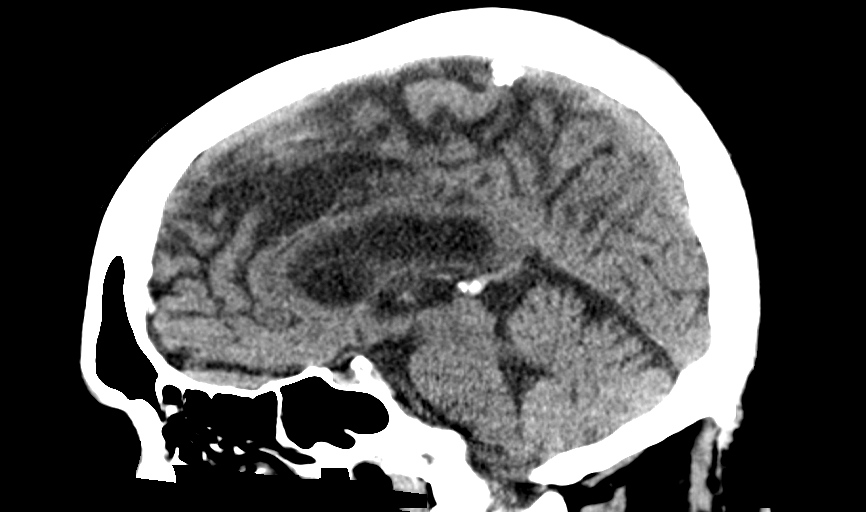
[im 49/73  brain]
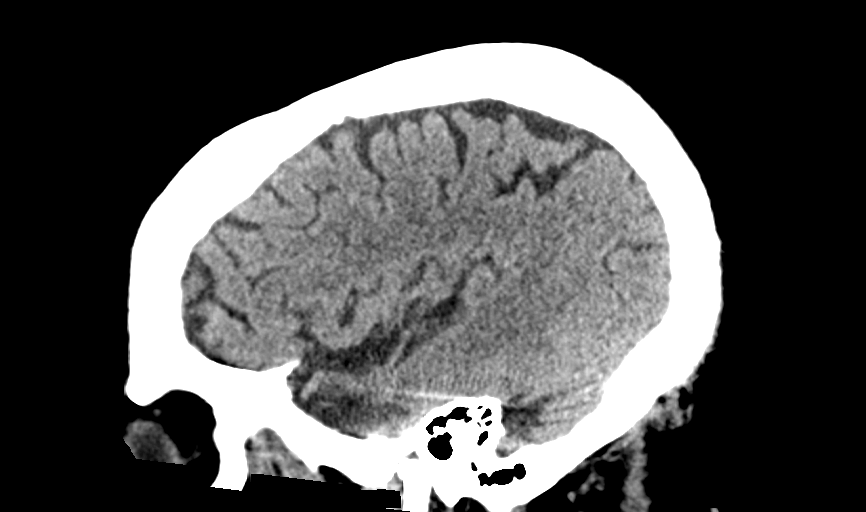

[14 of 47 positions shown; findings below may reference images not displayed]

FINDINGS: Brain: Ventricles and cisterns are within normal. There is mild
chronic ischemic microvascular disease present. Encephalomalacia
over the left temporal region likely postsurgical. No mass, mass
effect, shift midline structures or acute hemorrhage. No evidence of
acute infarction.

Vascular: No hyperdense vessel or unexpected calcification.

Skull: Previous left temporal craniotomy.  No acute fracture.

Sinuses/Orbits: No acute finding.

Other: None.
IMPRESSION: No acute intracranial findings.

Mild chronic ischemic microvascular disease.

Postsurgical change over the left temporal region.

## 2018-06-07 IMAGING — CR DG CHEST 2V
2 series · 2 of 2 positions shown · non-contrast
Comparison: 02/22/2017 and 04/11/2010

CLINICAL DATA: Altered mental status and fever past 12 hours.

EXAM:
CHEST  2 VIEW

[w chest lat]
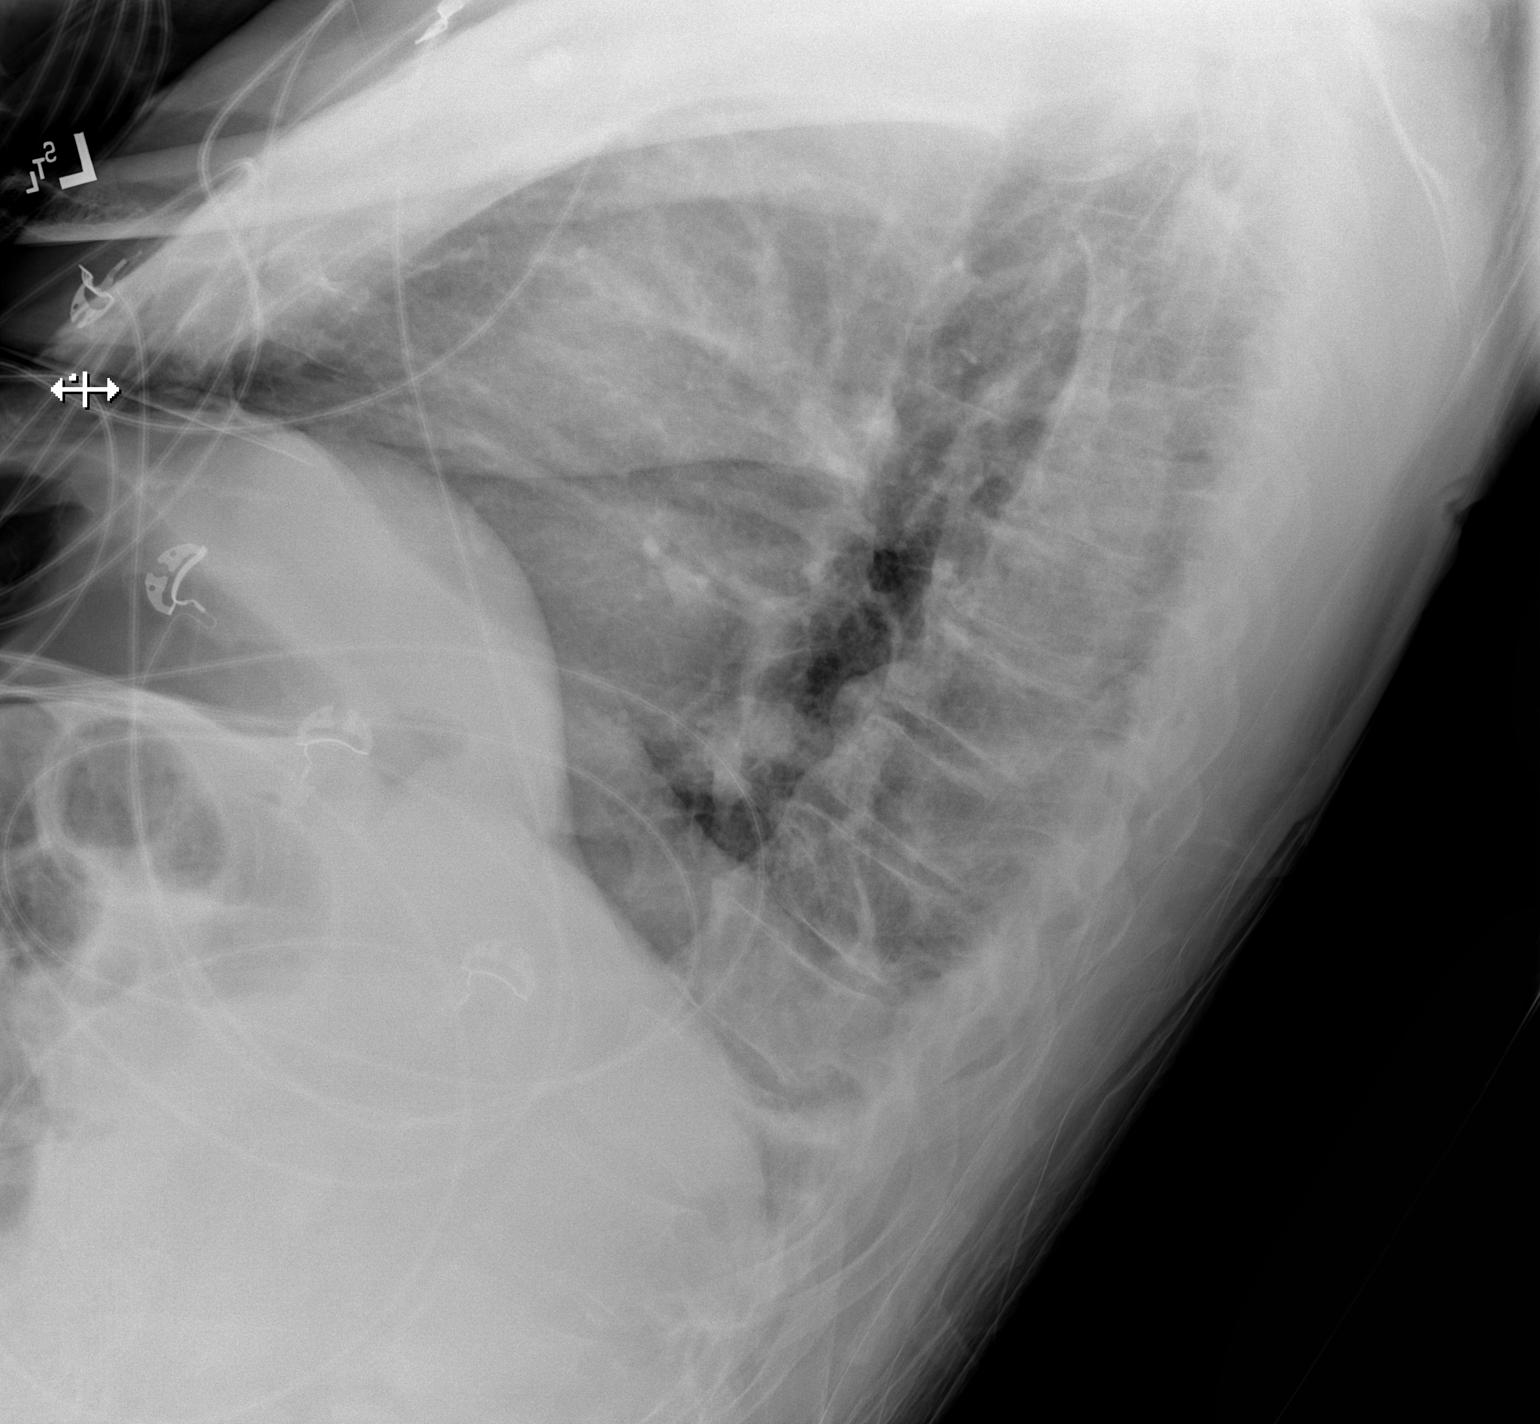

[x chest ap]
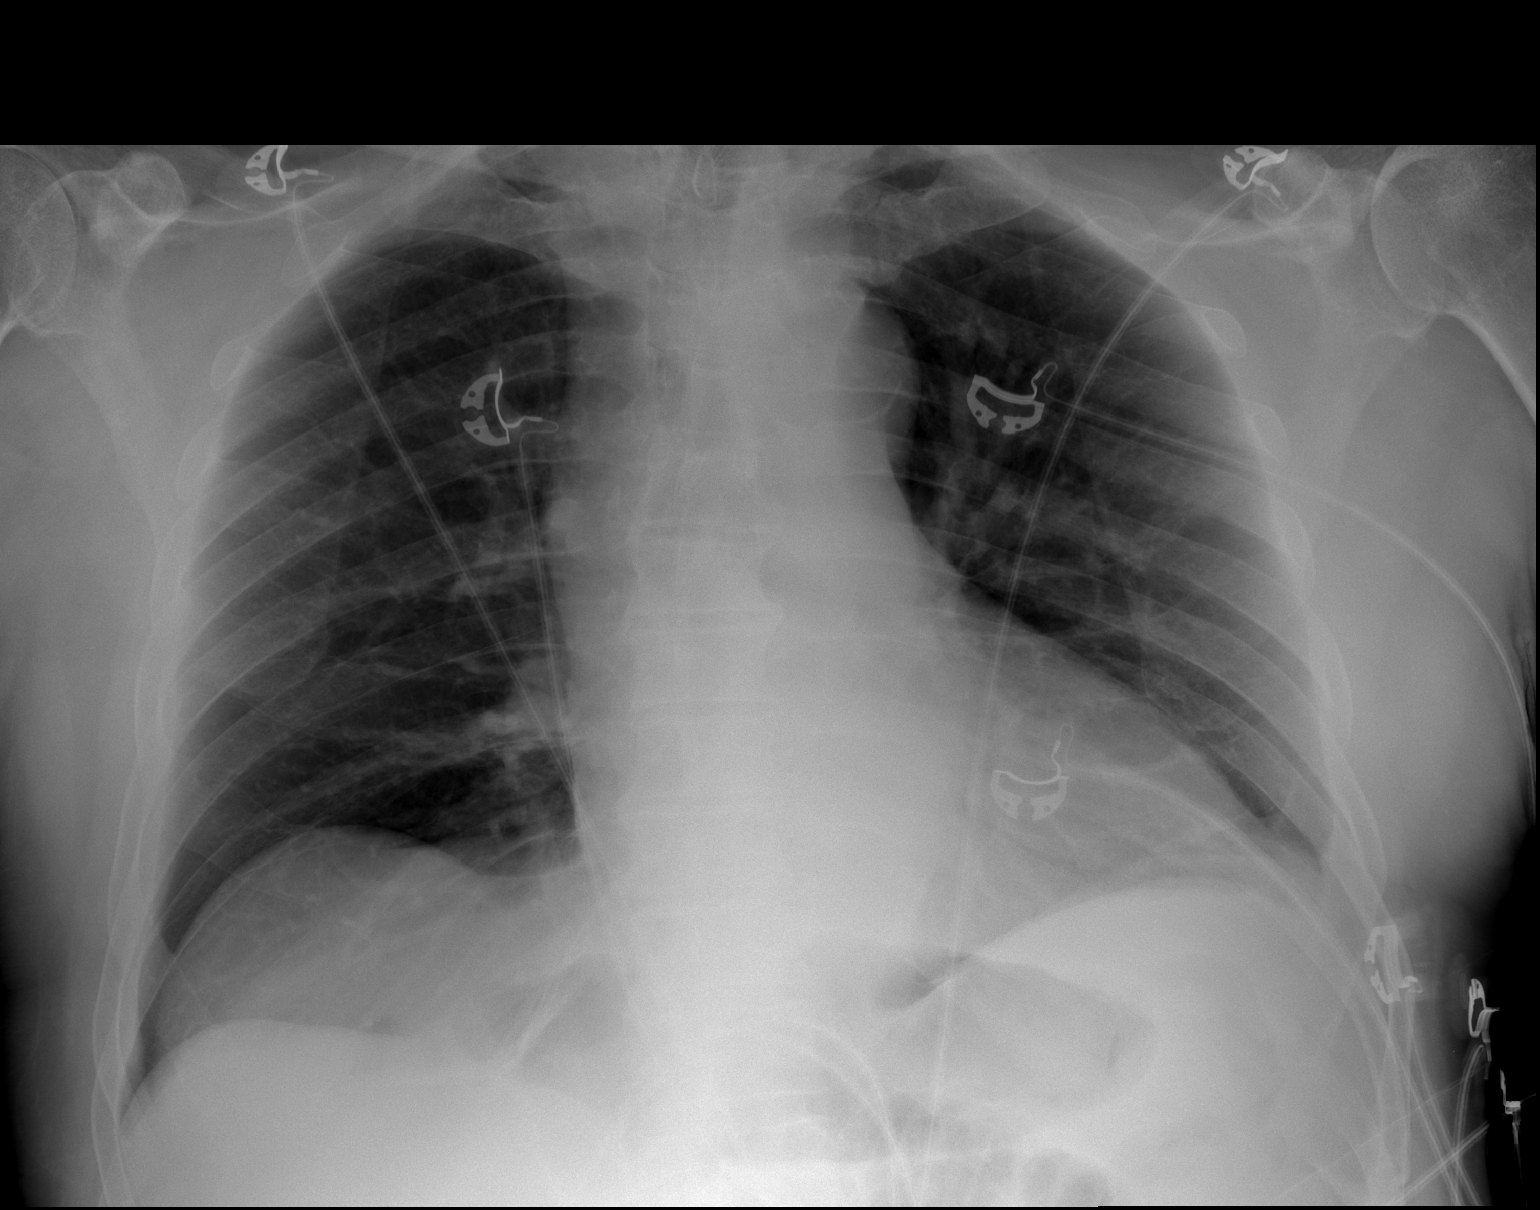

[2 of 2 positions shown; findings below may reference images not displayed]

FINDINGS: Lungs are adequately inflated without airspace consolidation or
effusion. Mild stable cardiomegaly. Minimal calcified plaque over
the thoracic aorta. There mild degenerate changes of the spine.
IMPRESSION: No acute cardiopulmonary disease.

Mild stable cardiomegaly.

Aortic Atherosclerosis ([id number].F).

## 2018-06-07 IMAGING — CR DG KNEE COMPLETE 4+V*L*
4 series · 4 of 4 positions shown · non-contrast
Comparison: None.

CLINICAL DATA: Left anterior knee pain when moving.

EXAM:
LEFT KNEE - COMPLETE 4+ VIEW

[x knee ap left (1 of 4)]
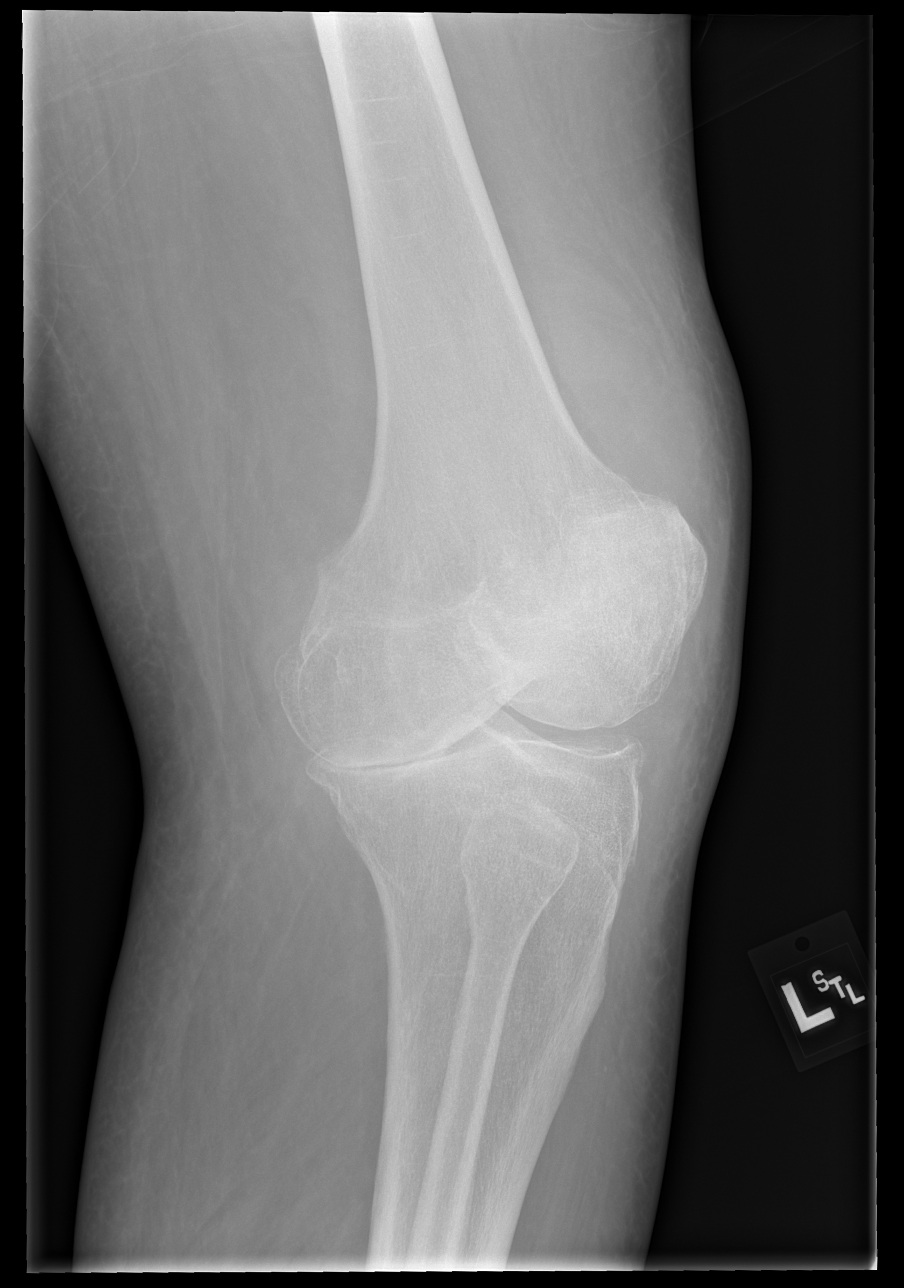

[x knee ap left (2 of 4)]
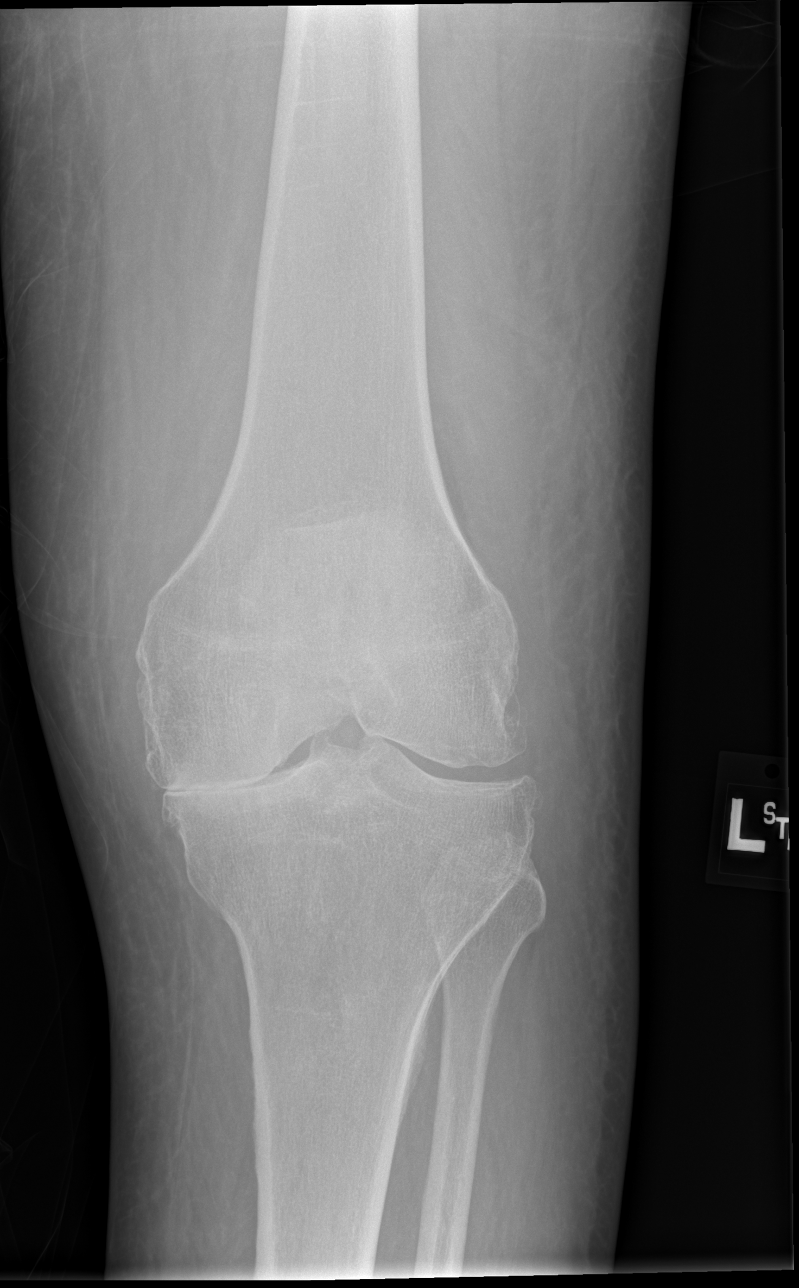

[x knee ap left (3 of 4)]
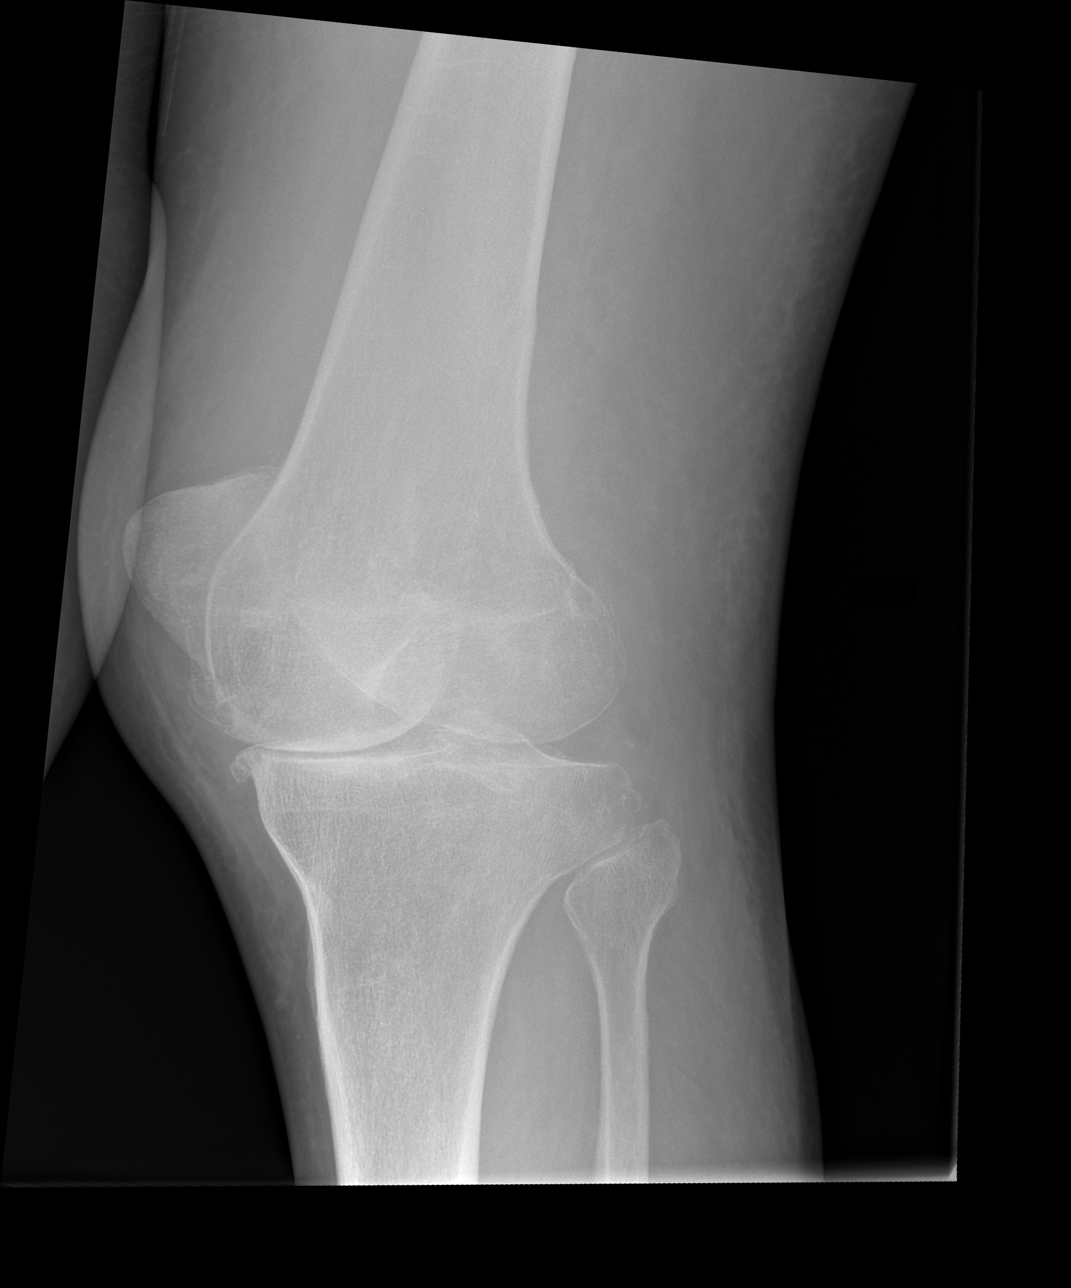

[x knee ap left (4 of 4)]
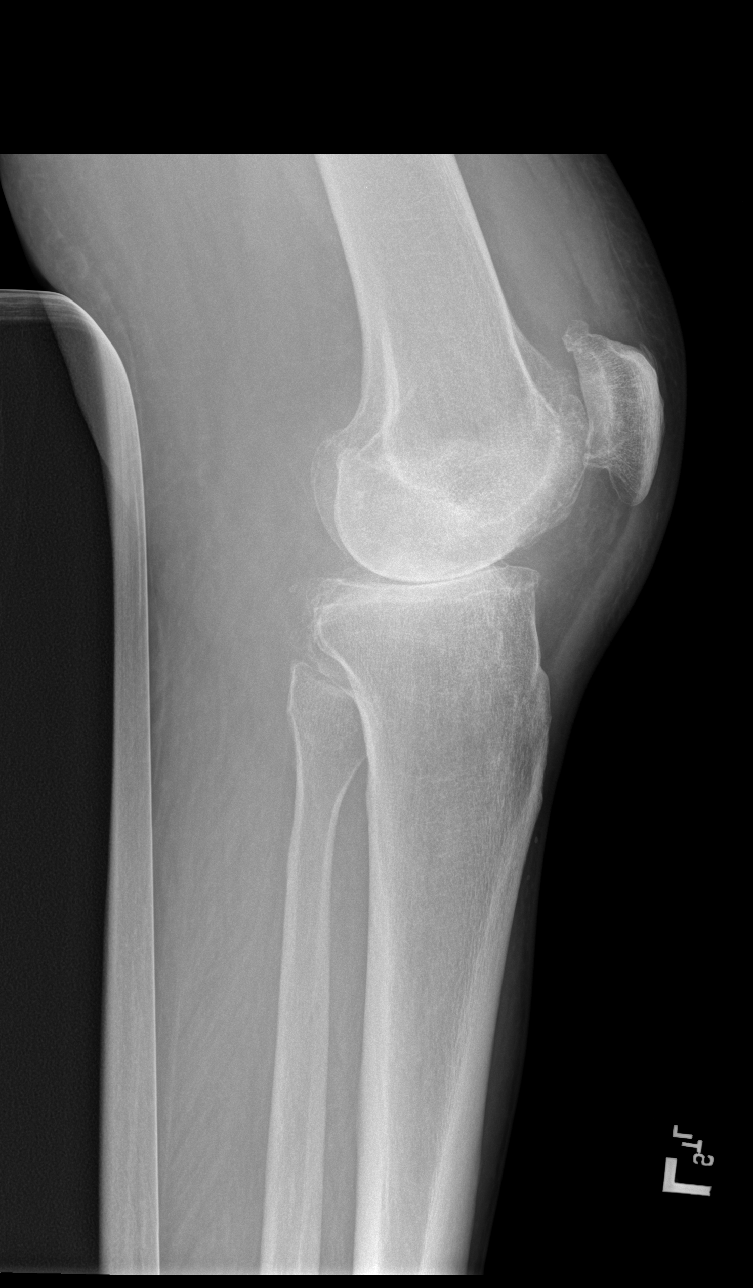

[4 of 4 positions shown; findings below may reference images not displayed]

FINDINGS: Osteopenia. Moderate tricompartmental osteoarthritic change worse
over the medial compartment. Small joint effusion. No acute fracture
or dislocation.
IMPRESSION: No acute findings.

Moderate tricompartmental osteoarthritis worse over the medial
compartment. Small joint effusion.

## 2018-08-22 ENCOUNTER — Other Ambulatory Visit: Payer: Self-pay

## 2018-08-22 DIAGNOSIS — I10 Essential (primary) hypertension: Secondary | ICD-10-CM

## 2018-08-22 MED ORDER — METOPROLOL TARTRATE 25 MG PO TABS: 25.0000 mg | ORAL_TABLET | Freq: Every day | ORAL | 3 refills | Status: DC

## 2018-08-23 ENCOUNTER — Other Ambulatory Visit: Payer: Self-pay

## 2018-08-23 MED ORDER — METOPROLOL SUCCINATE ER 25 MG PO TB24
25.0000 mg | ORAL_TABLET | Freq: Every day | ORAL | 0 refills | Status: DC
Start: 2018-08-23 — End: 2023-04-23

## 2019-04-12 ENCOUNTER — Other Ambulatory Visit: Payer: Self-pay | Admitting: Nurse Practitioner

## 2019-04-12 DIAGNOSIS — R200 Anesthesia of skin: Secondary | ICD-10-CM

## 2019-04-12 DIAGNOSIS — L819 Disorder of pigmentation, unspecified: Secondary | ICD-10-CM

## 2019-04-13 ENCOUNTER — Ambulatory Visit
Admission: RE | Admit: 2019-04-13 | Discharge: 2019-04-13 | Disposition: A | Discharge status: Home / Self Care | Payer: Medicare HMO | Source: Ambulatory Visit | Attending: Nurse Practitioner | Admitting: Nurse Practitioner

## 2019-04-13 DIAGNOSIS — R200 Anesthesia of skin: Secondary | ICD-10-CM

## 2019-04-14 ENCOUNTER — Other Ambulatory Visit: Payer: Medicare HMO

## 2019-05-11 ENCOUNTER — Inpatient Hospital Stay (HOSPITAL_COMMUNITY)
Admission: EM | Admit: 2019-05-11 | Discharge: 2019-05-13 | DRG: 872 | Disposition: A | Discharge status: Home / Self Care | Payer: Medicare Other | Attending: Internal Medicine | Admitting: Internal Medicine

## 2019-05-11 ENCOUNTER — Emergency Department (HOSPITAL_COMMUNITY): Payer: Medicare Other

## 2019-05-11 ENCOUNTER — Inpatient Hospital Stay (HOSPITAL_COMMUNITY): Payer: Medicare Other

## 2019-05-11 DIAGNOSIS — Z79899 Other long term (current) drug therapy: Secondary | ICD-10-CM

## 2019-05-11 DIAGNOSIS — R197 Diarrhea, unspecified: Secondary | ICD-10-CM

## 2019-05-11 DIAGNOSIS — F418 Other specified anxiety disorders: Secondary | ICD-10-CM | POA: Diagnosis present

## 2019-05-11 DIAGNOSIS — E1169 Type 2 diabetes mellitus with other specified complication: Secondary | ICD-10-CM | POA: Diagnosis not present

## 2019-05-11 DIAGNOSIS — R509 Fever, unspecified: Secondary | ICD-10-CM

## 2019-05-11 DIAGNOSIS — I77810 Thoracic aortic ectasia: Secondary | ICD-10-CM | POA: Diagnosis present

## 2019-05-11 DIAGNOSIS — G8929 Other chronic pain: Secondary | ICD-10-CM | POA: Diagnosis present

## 2019-05-11 DIAGNOSIS — E1159 Type 2 diabetes mellitus with other circulatory complications: Secondary | ICD-10-CM | POA: Diagnosis not present

## 2019-05-11 DIAGNOSIS — K219 Gastro-esophageal reflux disease without esophagitis: Secondary | ICD-10-CM | POA: Diagnosis not present

## 2019-05-11 DIAGNOSIS — I10 Essential (primary) hypertension: Secondary | ICD-10-CM | POA: Diagnosis not present

## 2019-05-11 DIAGNOSIS — Z880 Allergy status to penicillin: Secondary | ICD-10-CM | POA: Diagnosis not present

## 2019-05-11 DIAGNOSIS — R05 Cough: Secondary | ICD-10-CM | POA: Diagnosis present

## 2019-05-11 DIAGNOSIS — Z20828 Contact with and (suspected) exposure to other viral communicable diseases: Secondary | ICD-10-CM | POA: Diagnosis not present

## 2019-05-11 DIAGNOSIS — I152 Hypertension secondary to endocrine disorders: Secondary | ICD-10-CM | POA: Diagnosis present

## 2019-05-11 DIAGNOSIS — K592 Neurogenic bowel, not elsewhere classified: Secondary | ICD-10-CM | POA: Diagnosis present

## 2019-05-11 DIAGNOSIS — M48061 Spinal stenosis, lumbar region without neurogenic claudication: Secondary | ICD-10-CM | POA: Diagnosis not present

## 2019-05-11 DIAGNOSIS — E119 Type 2 diabetes mellitus without complications: Secondary | ICD-10-CM

## 2019-05-11 DIAGNOSIS — N319 Neuromuscular dysfunction of bladder, unspecified: Secondary | ICD-10-CM | POA: Diagnosis not present

## 2019-05-11 DIAGNOSIS — A084 Viral intestinal infection, unspecified: Secondary | ICD-10-CM | POA: Diagnosis not present

## 2019-05-11 DIAGNOSIS — Z87891 Personal history of nicotine dependence: Secondary | ICD-10-CM

## 2019-05-11 DIAGNOSIS — R17 Unspecified jaundice: Secondary | ICD-10-CM | POA: Diagnosis present

## 2019-05-11 DIAGNOSIS — A419 Sepsis, unspecified organism: Secondary | ICD-10-CM | POA: Diagnosis present

## 2019-05-11 DIAGNOSIS — Z833 Family history of diabetes mellitus: Secondary | ICD-10-CM | POA: Diagnosis not present

## 2019-05-11 DIAGNOSIS — E860 Dehydration: Secondary | ICD-10-CM | POA: Diagnosis not present

## 2019-05-11 DIAGNOSIS — E875 Hyperkalemia: Secondary | ICD-10-CM | POA: Diagnosis not present

## 2019-05-11 DIAGNOSIS — Z7984 Long term (current) use of oral hypoglycemic drugs: Secondary | ICD-10-CM | POA: Diagnosis not present

## 2019-05-11 DIAGNOSIS — E785 Hyperlipidemia, unspecified: Secondary | ICD-10-CM | POA: Diagnosis not present

## 2019-05-11 DIAGNOSIS — E871 Hypo-osmolality and hyponatremia: Secondary | ICD-10-CM | POA: Diagnosis present

## 2019-05-11 DIAGNOSIS — R059 Cough, unspecified: Secondary | ICD-10-CM

## 2019-05-11 DIAGNOSIS — R112 Nausea with vomiting, unspecified: Secondary | ICD-10-CM

## 2019-05-11 LAB — URINALYSIS, ROUTINE W REFLEX MICROSCOPIC
Bilirubin Urine: NEGATIVE
Glucose, UA: NEGATIVE mg/dL
Ketones, ur: 20 mg/dL — AB
Leukocytes,Ua: NEGATIVE
Nitrite: NEGATIVE
Protein, ur: 30 mg/dL — AB
Specific Gravity, Urine: 1.008 (ref 1.005–1.030)
pH: 6 (ref 5.0–8.0)

## 2019-05-11 LAB — POC SARS CORONAVIRUS 2 AG -  ED: SARS Coronavirus 2 Ag: NEGATIVE

## 2019-05-11 LAB — OSMOLALITY, URINE: Osmolality, Ur: 325 mOsm/kg (ref 300–900)

## 2019-05-11 LAB — CBC
HCT: 38.5 % — ABNORMAL LOW (ref 39.0–52.0)
Hemoglobin: 13.5 g/dL (ref 13.0–17.0)
MCH: 32.6 pg (ref 26.0–34.0)
MCHC: 35.1 g/dL (ref 30.0–36.0)
MCV: 93 fL (ref 80.0–100.0)
Platelets: 161 10*3/uL (ref 150–400)
RBC: 4.14 MIL/uL — ABNORMAL LOW (ref 4.22–5.81)
RDW: 13 % (ref 11.5–15.5)
WBC: 16.1 10*3/uL — ABNORMAL HIGH (ref 4.0–10.5)
nRBC: 0 % (ref 0.0–0.2)

## 2019-05-11 LAB — I-STAT CHEM 8, ED
BUN: 11 mg/dL (ref 8–23)
Calcium, Ion: 1.07 mmol/L — ABNORMAL LOW (ref 1.15–1.40)
Chloride: 98 mmol/L (ref 98–111)
Creatinine, Ser: 1 mg/dL (ref 0.61–1.24)
Glucose, Bld: 118 mg/dL — ABNORMAL HIGH (ref 70–99)
HCT: 39 % (ref 39.0–52.0)
Hemoglobin: 13.3 g/dL (ref 13.0–17.0)
Potassium: 3.5 mmol/L (ref 3.5–5.1)
Sodium: 133 mmol/L — ABNORMAL LOW (ref 135–145)
TCO2: 24 mmol/L (ref 22–32)

## 2019-05-11 LAB — PROTIME-INR
INR: 1.1 (ref 0.8–1.2)
Prothrombin Time: 13.6 seconds (ref 11.4–15.2)

## 2019-05-11 LAB — COMPREHENSIVE METABOLIC PANEL
ALT: 38 U/L (ref 0–44)
AST: 75 U/L — ABNORMAL HIGH (ref 15–41)
Albumin: 3.3 g/dL — ABNORMAL LOW (ref 3.5–5.0)
Alkaline Phosphatase: 43 U/L (ref 38–126)
Anion gap: 14 (ref 5–15)
BUN: 11 mg/dL (ref 8–23)
CO2: 20 mmol/L — ABNORMAL LOW (ref 22–32)
Calcium: 8.4 mg/dL — ABNORMAL LOW (ref 8.9–10.3)
Chloride: 95 mmol/L — ABNORMAL LOW (ref 98–111)
Creatinine, Ser: 1.13 mg/dL (ref 0.61–1.24)
GFR calc Af Amer: >60 mL/min (ref >60)
GFR calc non Af Amer: >60 mL/min (ref >60)
Glucose, Bld: 194 mg/dL — ABNORMAL HIGH (ref 70–99)
Potassium: 5.6 mmol/L — ABNORMAL HIGH (ref 3.5–5.1)
Sodium: 129 mmol/L — ABNORMAL LOW (ref 135–145)
Total Bilirubin: 2.4 mg/dL — ABNORMAL HIGH (ref 0.3–1.2)
Total Protein: 5.3 g/dL — ABNORMAL LOW (ref 6.5–8.1)

## 2019-05-11 LAB — HEMOGLOBIN A1C
Hgb A1c MFr Bld: 5.8 % — ABNORMAL HIGH (ref 4.8–5.6)
Mean Plasma Glucose: 119.76 mg/dL

## 2019-05-11 LAB — LACTIC ACID, PLASMA
Lactic Acid, Venous: 1.9 mmol/L (ref 0.5–1.9)
Lactic Acid, Venous: 1.4 mmol/L (ref 0.5–1.9)

## 2019-05-11 LAB — APTT: aPTT: 28 seconds (ref 24–36)

## 2019-05-11 LAB — ETHANOL: Alcohol, Ethyl (B): <10 mg/dL (ref <10)

## 2019-05-11 LAB — SARS CORONAVIRUS 2 (TAT 6-24 HRS): SARS Coronavirus 2: NEGATIVE

## 2019-05-11 LAB — SODIUM, URINE, RANDOM: Sodium, Ur: 93 mmol/L

## 2019-05-11 MED ORDER — GABAPENTIN 300 MG PO CAPS
600.0000 mg | ORAL_CAPSULE | Freq: Every day | ORAL | Status: DC
  Administered 2019-05-12 (×2): 600 mg via ORAL
  Filled 2019-05-11 (×3): qty 2

## 2019-05-11 MED ORDER — ENOXAPARIN SODIUM 40 MG/0.4ML ~~LOC~~ SOLN
40.0000 mg | SUBCUTANEOUS | Status: DC
  Administered 2019-05-12 (×2): 40 mg via SUBCUTANEOUS
  Filled 2019-05-11 (×2): qty 0.4

## 2019-05-11 MED ORDER — ACETAMINOPHEN 650 MG RE SUPP: 650.0000 mg | Freq: Four times a day (QID) | RECTAL | Status: DC | PRN

## 2019-05-11 MED ORDER — SODIUM CHLORIDE 0.9 % IV BOLUS
1000.0000 mL | Freq: Once | INTRAVENOUS | Status: AC
  Administered 2019-05-11: 17:00:00 1000 mL via INTRAVENOUS

## 2019-05-11 MED ORDER — ONDANSETRON HCL 4 MG/2ML IJ SOLN: 4.0000 mg | Freq: Four times a day (QID) | INTRAMUSCULAR | Status: DC | PRN

## 2019-05-11 MED ORDER — INSULIN ASPART 100 UNIT/ML ~~LOC~~ SOLN
0.0000 [IU] | Freq: Three times a day (TID) | SUBCUTANEOUS | Status: DC
  Administered 2019-05-12: 2 [IU] via SUBCUTANEOUS

## 2019-05-11 MED ORDER — PRAVASTATIN SODIUM 10 MG PO TABS
10.0000 mg | ORAL_TABLET | Freq: Every evening | ORAL | Status: DC
  Administered 2019-05-12 (×2): 10 mg via ORAL
  Filled 2019-05-11 (×2): qty 1

## 2019-05-11 MED ORDER — IBUPROFEN 400 MG PO TABS
400.0000 mg | ORAL_TABLET | Freq: Once | ORAL | Status: AC
  Administered 2019-05-11: 400 mg via ORAL
  Filled 2019-05-11: qty 1

## 2019-05-11 MED ORDER — TAMSULOSIN HCL 0.4 MG PO CAPS
0.4000 mg | ORAL_CAPSULE | Freq: Two times a day (BID) | ORAL | Status: DC
  Administered 2019-05-12 – 2019-05-13 (×4): 0.4 mg via ORAL
  Filled 2019-05-11 (×5): qty 1

## 2019-05-11 MED ORDER — SODIUM CHLORIDE 0.9 % IV BOLUS
1000.0000 mL | Freq: Once | INTRAVENOUS | Status: AC
  Administered 2019-05-11: 18:00:00 1000 mL via INTRAVENOUS

## 2019-05-11 MED ORDER — SODIUM CHLORIDE 0.9 % IV SOLN
INTRAVENOUS | Status: AC
  Administered 2019-05-12: 02:00:00 via INTRAVENOUS

## 2019-05-11 MED ORDER — ONDANSETRON HCL 4 MG PO TABS: 4.0000 mg | ORAL_TABLET | Freq: Four times a day (QID) | ORAL | Status: DC | PRN

## 2019-05-11 MED ORDER — ACETAMINOPHEN 325 MG PO TABS: 650.0000 mg | ORAL_TABLET | Freq: Four times a day (QID) | ORAL | Status: DC | PRN

## 2019-05-11 NOTE — ED Notes (Signed)
Per EMS daughter requesting updates [name] [phone]

## 2019-05-11 NOTE — ED Provider Notes (Signed)
[facility] EMERGENCY DEPARTMENT Provider Note   CSN: [id number] Arrival date & time: 05/11/19  1629     History   Chief Complaint Chief Complaint  Patient presents with   Weakness   Nausea   Emesis   Shortness of Breath    HPI Donald Calderon is a 78 y.o. male.     HPI  Donald Calderon is a 78 year old male with PMH of DM, HTN, GERD, HLD who presents to the ED with concern for fever, cough, nausea and vomiting.  Pt reports he began feeling unwell yesterday. Reports he has had generalized malaise and weakness. Yesterday he stood from sitting and fell on his side. Denies hitting his head or LOC. No anticoagulation. Patient reports since that time he has been experiencing cough, 2 episodes of NBNB emesis, loose stools.  Patient denies any headache.  No focal weakness.  He denies any difficulty breathing or shortness of breath.  Patient reports he did not know he had a fever until today.  No known sick contacts.  Patient lives alone with his wife.  He states he has not been eating or drinking as much today.  Denies any abdominal pain.  Patient does endorse some nightly dysuria recently.  Past Medical History:  Diagnosis Date   ALCOHOL ABUSE    Anxiety    Arthritis    Depression    Diabetes mellitus    GERD (gastroesophageal reflux disease)    Hypertension    Pneumonia     Patient Active Problem List   Diagnosis Date Noted   Sepsis due to undetermined organism ([place]) 05/11/2019   Hyperkalemia 05/11/2019   Hyperbilirubinemia 05/11/2019   Pressure injury of skin 03/10/2017   E coli UTI (urinary tract infection) [id number]   Acute metabolic encephalopathy [id number]   Encephalopathy 03/07/2017   Normocytic anemia 02/23/2017   Neurogenic bladder 02/23/2017   AKI (acute kidney injury) ([place]) 02/23/2017   Sepsis secondary to UTI ([place]) 02/22/2017   Postoperative ileus ([place]) 02/12/2017   Urine retention 01/29/2017   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/2017   Lumbar stenosis 01/26/2017   Diabetes mellitus type 2 in obese ([place])    Hypertension associated with diabetes ([place])    ETOH abuse    Chronic pain syndrome    Hyponatremia    Leukocytosis    Acute blood loss anemia    Epidural hematoma ([place]) 01/23/2017   Status post lumbar surgery 01/21/2017   Status post lumbar spine operation 01/21/2017   Chronic back pain 09/27/2011   NEUROPATHY 08/08/2010   SWELLING MASS OR LUMP IN HEAD AND NECK 06/27/2010   BENIGN PROSTATIC HYPERTROPHY, WITH URINARY OBSTRUCTION 06/19/2010   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   HYPOKALEMIA 03/18/2010   LIPOMA 02/14/2010   Diabetes ([place]) 02/07/2010   HYPERLIPIDEMIA 02/07/2010   ALCOHOL ABUSE 02/07/2010   HYPERTENSION 02/07/2010   SPINAL STENOSIS, LUMBAR 02/07/2010    Past Surgical History:  Procedure Laterality Date   BACK SURGERY      disc fro picched nerve times 2   EYE SURGERY Right    Gaucoma   LUMBAR LAMINECTOMY/DECOMPRESSION MICRODISCECTOMY N/A 01/21/2017   Procedure: Decompression Lumbar 2-3;  Surgeon: Melina Schools, MD;  Location: [facility];  Service: Orthopedics;  Laterality: N/A;  210 mins   LUMBAR LAMINECTOMY/DECOMPRESSION MICRODISCECTOMY N/A 01/23/2017   Procedure: EXPLORATION AND EVACUATION OF HEMATOMA L2-L3;  Surgeon: Melina Schools, MD;  Location: [place];  Service: Orthopedics;  Laterality: N/A;   PROSTATE SURGERY  Home Medications    Prior to Admission medications   Medication Sig Start Date End Date Taking? Authorizing Provider  Camphor-Menthol-Methyl Sal (SALONPAS EX) Apply 1 patch topically daily as needed (pain/aches).   Yes [provider]  gabapentin (NEURONTIN) 300 MG capsule Take 600 mg by mouth at bedtime. 04/21/19  Yes [provider]  lisinopril-hydrochlorothiazide (ZESTORETIC) 20-25 MG tablet Take 1 tablet by mouth daily. 04/06/19  Yes [provider]  metFORMIN (GLUCOPHAGE) 1000  MG tablet Take 2,000 mg by mouth at bedtime. 03/30/19  Yes [provider]  pravastatin (PRAVACHOL) 10 MG tablet Take 1 tablet (10 mg total) by mouth every evening. 02/12/17  Yes Love, Ivan Anchors, PA-C  tamsulosin (FLOMAX) 0.4 MG CAPS capsule Take 1 capsule (0.4 mg total) by mouth 2 (two) times daily. 02/12/17  Yes Love, Ivan Anchors, PA-C  acetaminophen (TYLENOL) 325 MG tablet Take 1-2 tablets (325-650 mg total) by mouth every 4 (four) hours as needed for mild pain. Patient not taking: Reported on 05/11/2019 02/11/17   Love, Ivan Anchors, PA-C  albuterol (PROVENTIL) (2.5 MG/3ML) 0.083% nebulizer solution Take 3 mLs (2.5 mg total) by nebulization every 2 (two) hours as needed for wheezing. Patient not taking: Reported on 05/11/2019 03/11/17   Theodis Blaze, MD  amLODipine (NORVASC) 10 MG tablet Take 1 tablet (10 mg total) by mouth daily. Patient not taking: Reported on 05/11/2019 02/12/17   Love, Ivan Anchors, PA-C  bethanechol (URECHOLINE) 25 MG tablet Take 1 tablet (25 mg total) by mouth 3 (three) times daily. Patient not taking: Reported on 05/11/2019 02/12/17   Love, Ivan Anchors, PA-C  cyclobenzaprine (FLEXERIL) 5 MG tablet Take 1 tablet (5 mg total) by mouth 3 (three) times daily as needed for muscle spasms. Patient not taking: Reported on 05/11/2019 02/12/17   Love, Ivan Anchors, PA-C  diclofenac sodium (VOLTAREN) 1 % GEL Apply 2 g topically 4 (four) times daily. Patient not taking: Reported on 05/11/2019 03/01/17   Arrien, Jimmy Picket, MD  gabapentin (NEURONTIN) 100 MG capsule Take 1 capsule (100 mg total) by mouth 3 (three) times daily. Patient not taking: Reported on 05/11/2019 02/12/17   Love, Ivan Anchors, PA-C  HYDROcodone-acetaminophen [facility]) 10-325 MG tablet Take 1 tablet by mouth every 12 (twelve) hours as needed for severe pain. Patient not taking: Reported on 05/11/2019 03/11/17   Theodis Blaze, MD  hydrocortisone (ANUSOL-HC) 2.5 % rectal cream Place rectally 4 (four) times daily. Patient not  taking: Reported on 05/11/2019 02/12/17   Love, Ivan Anchors, PA-C  LEVEMIR 100 UNIT/ML injection Inject 0.1-0.3 mLs (10-30 Units total) into the skin 2 (two) times daily. 10 units every morning, and 34 units every evening Patient not taking: Reported on 05/11/2019 02/12/17   Love, Ivan Anchors, PA-C  metoprolol succinate (TOPROL-XL) 25 MG 24 hr tablet Take 1 tablet (25 mg total) by mouth daily. Patient not taking: Reported on 05/11/2019 08/23/18   Adrian Prows, MD  pantoprazole (PROTONIX) 40 MG tablet Take 1 tablet (40 mg total) by mouth daily. 03/01/17 03/31/17  Arrien, Jimmy Picket, MD  polyethylene glycol [facility] Floria Raveling) packet Take 17 g by mouth daily as needed. Patient not taking: Reported on 05/11/2019 03/11/17   Theodis Blaze, MD  traMADol (ULTRAM) 50 MG tablet Take 1 tablet (50 mg total) by mouth every 6 (six) hours as needed for moderate pain. Patient not taking: Reported on 05/11/2019 03/11/17   Theodis Blaze, MD    Family History Family History  Problem Relation Age of  Onset   Diabetes Mother    Diabetes Father     Social History Social History   Tobacco Use   Smoking status: Former Smoker    Packs/day: 1.00    Years: 39.00    Pack years: 39.00    Types: Cigarettes    Quit date: 2000    Years since quitting: 20.9   Smokeless tobacco: Former Systems developer    Types: [place] date: 09/26/1997  Substance Use Topics   Alcohol use: Yes    Alcohol/week: 4.0 standard drinks    Types: 4 Cans of beer per week    Comment: 4 cans of beer per day   Drug use: No     Allergies   Penicillins   Review of Systems Review of Systems  Constitutional: Positive for chills and fever.  HENT: Negative for congestion and sore throat.   Eyes: Negative for pain and visual disturbance.  Respiratory: Positive for cough. Negative for shortness of breath.   Cardiovascular: Negative for chest pain and palpitations.  Gastrointestinal: Positive for diarrhea, nausea and vomiting. Negative for  abdominal pain.  Genitourinary: Positive for dysuria. Negative for hematuria.  Musculoskeletal: Negative for arthralgias and back pain.  Skin: Negative for color change and rash.  Neurological: Positive for weakness (Generalized ). Negative for seizures and syncope.  All other systems reviewed and are negative.    Physical Exam Updated Vital Signs BP (!) 146/84    Pulse (!) 101    Temp 99.7 F (37.6 C) (Oral)    Resp 18    Ht 5\' 9"  (1.753 m)    Wt 98.9 kg    SpO2 98%    BMI 32.19 kg/m   Physical Exam Vitals signs and nursing note reviewed.  Constitutional:      Appearance: He is well-developed.  HENT:     Head: Normocephalic and atraumatic.     Mouth/Throat:     Mouth: Mucous membranes are dry.  Eyes:     Extraocular Movements: Extraocular movements intact.     Conjunctiva/sclera: Conjunctivae normal.     Pupils: Pupils are equal, round, and reactive to light.  Neck:     Musculoskeletal: Neck supple.  Cardiovascular:     Rate and Rhythm: Regular rhythm. Tachycardia present.     Heart sounds: No murmur.  Pulmonary:     Effort: Pulmonary effort is normal. No respiratory distress.     Breath sounds: Normal breath sounds.  Abdominal:     Palpations: Abdomen is soft.     Tenderness: There is no abdominal tenderness.  Skin:    General: Skin is warm and dry.  Neurological:     General: No focal deficit present.     Mental Status: He is alert and oriented to person, place, and time.     Sensory: No sensory deficit.     Motor: No weakness (No focal weakness or sensory deficit).  Psychiatric:        Mood and Affect: Mood normal.        Behavior: Behavior normal.      ED Treatments / Results  Labs (all labs ordered are listed, but only abnormal results are displayed) Labs Reviewed  COMPREHENSIVE METABOLIC PANEL - Abnormal; Notable for the following components:      Result Value   Sodium 129 (*)    Potassium 5.6 (*)    Chloride 95 (*)    CO2 20 (*)    Glucose, Bld  194 (*)  Calcium 8.4 (*)    Total Protein 5.3 (*)    Albumin 3.3 (*)    AST 75 (*)    Total Bilirubin 2.4 (*)    All other components within normal limits  URINALYSIS, ROUTINE W REFLEX MICROSCOPIC - Abnormal; Notable for the following components:   Hgb urine dipstick LARGE (*)    Ketones, ur 20 (*)    Protein, ur 30 (*)    Bacteria, UA FEW (*)    All other components within normal limits  CBC - Abnormal; Notable for the following components:   WBC 16.1 (*)    RBC 4.14 (*)    HCT 38.5 (*)    All other components within normal limits  HEMOGLOBIN A1C - Abnormal; Notable for the following components:   Hgb A1c MFr Bld 5.8 (*)    All other components within normal limits  I-STAT CHEM 8, ED - Abnormal; Notable for the following components:   Sodium 133 (*)    Glucose, Bld 118 (*)    Calcium, Ion 1.07 (*)    All other components within normal limits  SARS CORONAVIRUS 2 (TAT 6-24 HRS)  CULTURE, BLOOD (ROUTINE X 2)  CULTURE, BLOOD (ROUTINE X 2)  URINE CULTURE  RESPIRATORY PANEL BY PCR  GASTROINTESTINAL PANEL BY PCR, STOOL (REPLACES STOOL CULTURE)  C DIFFICILE QUICK SCREEN W PCR REFLEX  LACTIC ACID, PLASMA  LACTIC ACID, PLASMA  PROTIME-INR  APTT  SODIUM, URINE, RANDOM  ETHANOL  OSMOLALITY, URINE  CBC WITH DIFFERENTIAL/PLATELET  CBC WITH DIFFERENTIAL/PLATELET  HEPATIC FUNCTION PANEL  CBC  BASIC METABOLIC PANEL  MAGNESIUM  POTASSIUM  PROCALCITONIN  OSMOLALITY  POC SARS CORONAVIRUS 2 AG -  ED    EKG EKG Interpretation  Date/Time:  Thursday May 11 2019 16:44:41 EST Ventricular Rate:  118 PR Interval:    QRS Duration: 80 QT Interval:  318 QTC Calculation: 446 R Axis:   -3 Text Interpretation: Sinus tachycardia Atrial premature complex Low voltage, extremity leads Minimal ST depression, inferior leads Confirmed by Quintella Reichert [phone]) on 05/11/2019 5:03:17 PM   Radiology US Abdomen Complete  Result Date: 05/11/2019 CLINICAL DATA:  Hyperbilirubinemia,  nausea, vomiting EXAM: ABDOMEN ULTRASOUND COMPLETE COMPARISON:  Renal ultrasound 02/25/2017 FINDINGS: Gallbladder: No gallstones or wall thickening visualized. No sonographic Murphy sign noted by sonographer. Common bile duct: Diameter: 7.7 mm, upper limits of normal for patient's age. Liver: No focal lesion identified. Within normal limits in parenchymal echogenicity. Portal vein is patent on color Doppler imaging with normal direction of blood flow towards the liver. IVC: No abnormality visualized. Pancreas: Poorly visualized due to extensive bowel gas. Spleen: Size and appearance within normal limits. Right Kidney: Length: 12.6 cm. 7 mm echogenic shadowing calcification in the lower pole right kidney. No hydronephrosis or visible concerning renal mass. Left Kidney: Length: 11.5 cm. 6 mm echogenic, shadowing calculus in the lower pole left kidney. No hydronephrosis or visible concerning renal mass. Abdominal aorta: Difficult visualization due to bowel gas. Proximal to mid aorta is not well visualized. Distally measures 1.8 cm. Other findings: None. IMPRESSION: Technically difficult exam due to extensive bowel gas. Poor visualization of the pancreas and proximal aorta. Bilateral nonobstructive nephrolithiasis. Otherwise unremarkable abdominal ultrasound. Specifically, no frank biliary ductal dilatation. Electronically Signed   By: Lovena Le M.D.   On: 05/11/2019 23:21   Dg Chest Port 1 View  Result Date: 05/11/2019 CLINICAL DATA:  Fever and cough. EXAM: PORTABLE CHEST 1 VIEW COMPARISON:  03/07/2017. FINDINGS: Cardiac silhouette is normal in size.  No mediastinal or hilar masses. No evidence of adenopathy. Small area of opacity is noted at the right lateral lung base, stable from prior study consistent scarring. Lungs otherwise clear. No pleural effusion or pneumothorax. Skeletal structures are grossly intact. IMPRESSION: No active disease. Electronically Signed   By: Lajean Manes M.D.   On: 05/11/2019 17:22     Procedures Procedures (including critical care time)  Medications Ordered in ED Medications  enoxaparin (LOVENOX) injection 40 mg (has no administration in time range)  0.9 %  sodium chloride infusion (has no administration in time range)  acetaminophen (TYLENOL) tablet 650 mg (has no administration in time range)    Or  acetaminophen (TYLENOL) suppository 650 mg (has no administration in time range)  ondansetron (ZOFRAN) tablet 4 mg (has no administration in time range)    Or  ondansetron (ZOFRAN) injection 4 mg (has no administration in time range)  gabapentin (NEURONTIN) capsule 600 mg (has no administration in time range)  pravastatin (PRAVACHOL) tablet 10 mg (has no administration in time range)  tamsulosin (FLOMAX) capsule 0.4 mg (has no administration in time range)  insulin aspart (novoLOG) injection 0-9 Units (has no administration in time range)  sodium chloride 0.9 % bolus 1,000 mL (0 mLs Intravenous Stopped 05/11/19 1820)  sodium chloride 0.9 % bolus 1,000 mL (0 mLs Intravenous Stopped 05/11/19 2324)  ibuprofen (ADVIL) tablet 400 mg (400 mg Oral Given 05/11/19 1824)     Initial Impression / Assessment and Plan / ED Course  I have reviewed the triage vital signs and the nursing notes.  Pertinent labs & imaging results that were available during my care of the patient were reviewed by me and considered in my medical decision making (see chart for details).  Donald Calderon was evaluated in Emergency Department on 05/12/2019 for the symptoms described in the history of present illness. He was evaluated in the context of the global COVID-19 pandemic, which necessitated consideration that the patient might be at risk for infection with the SARS-CoV-2 virus that causes COVID-19. Institutional protocols and algorithms that pertain to the evaluation of patients at risk for COVID-19 are in a state of rapid change based on information released by regulatory bodies including the CDC  and federal and state organizations. These policies and algorithms were followed during the patient's care in the ED.     On arrival, patient is febrile, tachycardic.  With EMS, patient was febrile and given 1 g of Tylenol as well as 350 cc IV fluids.  Patient is alert and oriented.  No focal neuro deficits. Given tachycardia and fever, code sepsis initiated initially. No focal findings, will defer broad spectrum Abx at this time awaiting Covid 19 result.   Considered: Viral URI, COVID-19, pneumonia, UTI/pyelonephritis.  Abdomen is soft, benign, nonfocal  EKG: Sinus tachycardia with occasional PACs Chest x-ray: No acute findings  HR improving with IVF UA: without signs of acute UTI  Upon reassessment, heart rate is improving.  Patient is alert and oriented.  Reassessment of abdominal exam remains benign nonfocal nontender.  Low suspicion for GU source.  Possible viral gastroenteritis as patient has had vomiting and diarrhea.  We will also send RVP to assess for other viral illnesses.  Low suspicion for bacteremia at this time given patient's well appearance.  He is tolerating p.o. intake upon reassessment.  In setting of tachycardia, leukocytosis, age and comorbidities, hospitalist contacted for admission for further observation and management.  I have seen and evaluated the patient at bedside  and have assumed care.  Patient agreeable for admission.  No further acute events.  Final Clinical Impressions(s) / ED Diagnoses   Final diagnoses:  Nausea vomiting and diarrhea  Cough  Fever, unspecified fever cause    ED Discharge Orders    None       Burns Spain, MD 05/12/19 Fernand Parkins, MD 05/14/19 2026

## 2019-05-11 NOTE — H&P (Signed)
History and Physical    Donald Calderon:[id number] DOB: 12/23/1940 DOA: 05/11/2019  PCP: Donald Perna, NP  Patient coming from: Home  I have personally briefly reviewed patient's old medical records in [place]  Chief Complaint: nausea, vomiting, diarrhea, cough  HPI: Donald Calderon is a 78 y.o. male with medical history significant for type 2 diabetes, hypertension, depression with anxiety, chronic lumbar back/stenosis pain s/p decompression with neurogenic bladder/bowel/gait, and alcohol use who presents to the ED for evaluation of new onset nausea, vomiting, and diarrhea.  Patient states he was in his usual state of health until yesterday when he developed new onset of nausea with nonbloody nonbilious emesis.  He has also had loose watery stools, diaphoresis, and chills.  He reports a new cough productive of scant white sputum.  He states he normally has issues with constipation rather than diarrhea.  He is not taking any laxatives.  He reports chronic difficulty with voiding his bladder since prior lower back surgery which is not significantly changed.  Patient states he normally transports with the use of a wheelchair, he can bear his own weight when using his Rollator walker but otherwise he is not ambulatory.  He currently lives with his wife at home.  He reports remote history of tobacco use, quitting 30 years ago.  He reports ongoing alcohol use up to 3 shots of liquor per day, he does admit to some increased use recently.  He denies any known sick contacts.  ED Course:  Initial vitals showed BP 134/83, pulse 115, RR 20, temp 101.2 Fahrenheit, SPO2 96% on room air.  Labs notable for WBC 16.1, hemoglobin 13.5, platelets 161,000, sodium 129, potassium 5.6 (possibly hemolyzed sample), BUN 11, creatinine 1.13, AST 75, ALT 38, alk phos 43, total bilirubin 2.4, lactic acid 1.4.  Urinalysis was negative for UTI.  Blood cultures were obtained and pending.  SARS-CoV-2 rapid  antigen test was negative, SARS-CoV-2 PCR test was obtained and pending.  C. difficile, GI pathogen, and respiratory viral panels were ordered and pending.  Portable chest x-ray was negative for acute cardiopulmonary process.  Patient was given 1 L normal saline and Advil and the hospitalist service was consulted for further evaluation and treatment.  Review of Systems: All systems reviewed and are negative except as documented in history of present illness above.   Past Medical History:  Diagnosis Date  . ALCOHOL ABUSE   . Anxiety   . Arthritis   . Depression   . Diabetes mellitus   . GERD (gastroesophageal reflux disease)   . Hypertension   . Pneumonia     Past Surgical History:  Procedure Laterality Date  . BACK SURGERY      disc fro picched nerve times 2  . EYE SURGERY Right    Gaucoma  . LUMBAR LAMINECTOMY/DECOMPRESSION MICRODISCECTOMY N/A 01/21/2017   Procedure: Decompression Lumbar 2-3;  Surgeon: Melina Schools, MD;  Location: [place];  Service: Orthopedics;  Laterality: N/A;  210 mins  . LUMBAR LAMINECTOMY/DECOMPRESSION MICRODISCECTOMY N/A 01/23/2017   Procedure: EXPLORATION AND EVACUATION OF HEMATOMA L2-L3;  Surgeon: Melina Schools, MD;  Location: [place];  Service: Orthopedics;  Laterality: N/A;  . PROSTATE SURGERY      Social History:  reports that he quit smoking about 20 years ago. His smoking use included cigarettes. He has a 39.00 pack-year smoking history. He quit smokeless tobacco use about 21 years ago.  His smokeless tobacco use included chew. He reports current alcohol use of about  4.0 standard drinks of alcohol per week. He reports that he does not use drugs.  Allergies  Allergen Reactions  . Penicillins Itching and Rash    Has patient had a PCN reaction causing immediate rash, facial/tongue/throat swelling, SOB or lightheadedness with hypotension: No Has patient had a PCN reaction causing severe rash involving mucus membranes or skin necrosis: No Has patient  had a PCN reaction that required hospitalization: No Has patient had a PCN reaction occurring within the last 10 years: No If all of the above answers are "NO", then may proceed with Cephalosporin use.  Tolerated Ancef     Family History  Problem Relation Age of Onset  . Diabetes Mother   . Diabetes Father      Prior to Admission medications   Medication Sig Start Date End Date Taking? Authorizing Provider  Camphor-Menthol-Methyl Sal (SALONPAS EX) Apply 1 patch topically daily as needed (pain/aches).   Yes [provider]  gabapentin (NEURONTIN) 300 MG capsule Take 600 mg by mouth at bedtime. 04/21/19  Yes [provider]  lisinopril-hydrochlorothiazide (ZESTORETIC) 20-25 MG tablet Take 1 tablet by mouth daily. 04/06/19  Yes [provider]  metFORMIN (GLUCOPHAGE) 1000 MG tablet Take 2,000 mg by mouth at bedtime. 03/30/19  Yes [provider]  pravastatin (PRAVACHOL) 10 MG tablet Take 1 tablet (10 mg total) by mouth every evening. 02/12/17  Yes Love, Ivan Anchors, PA-C  tamsulosin (FLOMAX) 0.4 MG CAPS capsule Take 1 capsule (0.4 mg total) by mouth 2 (two) times daily. 02/12/17  Yes Love, Ivan Anchors, PA-C  acetaminophen (TYLENOL) 325 MG tablet Take 1-2 tablets (325-650 mg total) by mouth every 4 (four) hours as needed for mild pain. Patient not taking: Reported on 05/11/2019 02/11/17   Love, Ivan Anchors, PA-C  albuterol (PROVENTIL) (2.5 MG/3ML) 0.083% nebulizer solution Take 3 mLs (2.5 mg total) by nebulization every 2 (two) hours as needed for wheezing. Patient not taking: Reported on 05/11/2019 03/11/17   Theodis Blaze, MD  amLODipine (NORVASC) 10 MG tablet Take 1 tablet (10 mg total) by mouth daily. Patient not taking: Reported on 05/11/2019 02/12/17   Love, Ivan Anchors, PA-C  bethanechol (URECHOLINE) 25 MG tablet Take 1 tablet (25 mg total) by mouth 3 (three) times daily. Patient not taking: Reported on 05/11/2019 02/12/17   Love, Ivan Anchors, PA-C  cyclobenzaprine  (FLEXERIL) 5 MG tablet Take 1 tablet (5 mg total) by mouth 3 (three) times daily as needed for muscle spasms. Patient not taking: Reported on 05/11/2019 02/12/17   Love, Ivan Anchors, PA-C  diclofenac sodium (VOLTAREN) 1 % GEL Apply 2 g topically 4 (four) times daily. Patient not taking: Reported on 05/11/2019 03/01/17   Arrien, Jimmy Picket, MD  gabapentin (NEURONTIN) 100 MG capsule Take 1 capsule (100 mg total) by mouth 3 (three) times daily. Patient not taking: Reported on 05/11/2019 02/12/17   Love, Ivan Anchors, PA-C  HYDROcodone-acetaminophen [facility]) 10-325 MG tablet Take 1 tablet by mouth every 12 (twelve) hours as needed for severe pain. Patient not taking: Reported on 05/11/2019 03/11/17   Theodis Blaze, MD  hydrocortisone (ANUSOL-HC) 2.5 % rectal cream Place rectally 4 (four) times daily. Patient not taking: Reported on 05/11/2019 02/12/17   Love, Ivan Anchors, PA-C  LEVEMIR 100 UNIT/ML injection Inject 0.1-0.3 mLs (10-30 Units total) into the skin 2 (two) times daily. 10 units every morning, and 34 units every evening Patient not taking: Reported on 05/11/2019 02/12/17   Love, Ivan Anchors, PA-C  metoprolol succinate (TOPROL-XL) 25  MG 24 hr tablet Take 1 tablet (25 mg total) by mouth daily. Patient not taking: Reported on 05/11/2019 08/23/18   Adrian Prows, MD  pantoprazole (PROTONIX) 40 MG tablet Take 1 tablet (40 mg total) by mouth daily. 03/01/17 03/31/17  Arrien, Jimmy Picket, MD  polyethylene glycol [facility] Flor[facility]aveling) packet Take 17 g by mouth daily as needed. Patient not taking: Reported on 05/11/2019 03/11/17   Theodis Blaze, MD  traMADol (ULTRAM) 50 MG tablet Take 1 tablet (50 mg total) by mouth every 6 (six) hours as needed for moderate pain. Patient not taking: Reported on 05/11/2019 03/11/17   Theodis Blaze, MD    Physical Exam: Vitals:   05/11/19 1815 05/11/19 1845 05/11/19 1900 05/11/19 1927  BP: (!) 151/92 136/80 (!) 146/84   Pulse: (!) 107 (!) 102 (!) 101   Resp: (!) '23 19 18    '$ Temp:    99.7 F (37.6 C)  TempSrc:    Oral  SpO2: 100% 96% 98%   Weight:      Height:        Constitutional: Resting supine in bed, NAD, calm, comfortable Eyes: PERRL, lids and conjunctivae normal ENMT: Mucous membranes are moist. Posterior pharynx clear of any exudate or lesions.Normal dentition.  Neck: normal, supple, no masses. Respiratory: clear to auscultation bilaterally, no wheezing, no crackles. Normal respiratory effort. No accessory muscle use.  Cardiovascular: Tachycardic, no murmurs / rubs / gallops. No extremity edema. 2+ pedal pulses. Abdomen: no tenderness, no masses palpated. No hepatosplenomegaly. Bowel sounds positive.  Musculoskeletal: no clubbing / cyanosis. No joint deformity upper and lower extremities. No contractures. Normal muscle tone.  Skin: no rashes, lesions, ulcers. No induration Neurologic: CN 2-12 grossly intact. Sensation intact, Strength 5/5 in all 4 while resting in bed.  Psychiatric: Normal judgment and insight. Alert and oriented x 3. Normal mood.     Labs on Admission: I have personally reviewed following labs and imaging studies  CBC: Recent Labs  Lab 05/11/19 1813 05/11/19 2006  WBC 16.1*  --   HGB 13.5 13.3  HCT 38.5* 39.0  MCV 93.0  --   PLT 161  --    Basic Metabolic Panel: Recent Labs  Lab 05/11/19 1653 05/11/19 2006  NA 129* 133*  K 5.6* 3.5  CL 95* 98  CO2 20*  --   GLUCOSE 194* 118*  BUN 11 11  CREATININE 1.13 1.00  CALCIUM 8.4*  --    GFR: Estimated Creatinine Clearance: 70.6 mL/min (by C-G formula based on SCr of 1 mg/dL). Liver Function Tests: Recent Labs  Lab 05/11/19 1653  AST 75*  ALT 38  ALKPHOS 43  BILITOT 2.4*  PROT 5.3*  ALBUMIN 3.3*   No results for input(s): LIPASE, AMYLASE in the last 168 hours. No results for input(s): AMMONIA in the last 168 hours. Coagulation Profile: Recent Labs  Lab 05/11/19 1813  INR 1.1   Cardiac Enzymes: No results for input(s): CKTOTAL, CKMB, CKMBINDEX,  TROPONINI in the last 168 hours. BNP (last 3 results) No results for input(s): PROBNP in the last 8760 hours. HbA1C: No results for input(s): HGBA1C in the last 72 hours. CBG: No results for input(s): GLUCAP in the last 168 hours. Lipid Profile: No results for input(s): CHOL, HDL, LDLCALC, TRIG, CHOLHDL, LDLDIRECT in the last 72 hours. Thyroid Function Tests: No results for input(s): TSH, T4TOTAL, FREET4, T3FREE, THYROIDAB in the last 72 hours. Anemia Panel: No results for input(s): VITAMINB12, FOLATE, FERRITIN, TIBC, IRON, RETICCTPCT in the  last 72 hours. Urine analysis:    Component Value Date/Time   COLORURINE YELLOW 05/11/2019 1935   APPEARANCEUR CLEAR 05/11/2019 1935   LABSPEC 1.008 05/11/2019 1935   PHURINE 6.0 05/11/2019 1935   GLUCOSEU NEGATIVE 05/11/2019 1935   HGBUR LARGE (A) 05/11/2019 1935   HGBUR trace-intact 08/08/2010 1216   BILIRUBINUR NEGATIVE 05/11/2019 1935   KETONESUR 20 (A) 05/11/2019 1935   PROTEINUR 30 (A) 05/11/2019 1935   UROBILINOGEN 0.2 09/27/2011 1540   NITRITE NEGATIVE 05/11/2019 1935   LEUKOCYTESUR NEGATIVE 05/11/2019 1935    Radiological Exams on Admission: Dg Chest Port 1 View  Result Date: 05/11/2019 CLINICAL DATA:  Fever and cough. EXAM: PORTABLE CHEST 1 VIEW COMPARISON:  03/07/2017. FINDINGS: Cardiac silhouette is normal in size. No mediastinal or hilar masses. No evidence of adenopathy. Small area of opacity is noted at the right lateral lung base, stable from prior study consistent scarring. Lungs otherwise clear. No pleural effusion or pneumothorax. Skeletal structures are grossly intact. IMPRESSION: No active disease. Electronically Signed   By: Lajean Manes M.D.   On: 05/11/2019 17:22    EKG: Independently reviewed. Sinus tachycardia with low voltage in extremity leads.  Rate is faster when compared to prior.  Assessment/Plan Principal Problem:   Sepsis due to undetermined organism [facility]) Active Problems:   Diabetes ([place])    Hypertension associated with diabetes ([place])   Hyponatremia   Hyperkalemia   Hyperbilirubinemia  Donald Calderon is a 78 y.o. male with medical history significant for type 2 diabetes, hypertension, depression with anxiety, chronic lumbar back/stenosis pain s/p decompression with neurogenic bladder/bowel/gait, and alcohol use who is admitted for sepsis of unknown origin.   Sepsis: Patient presented with fever, leukocytosis, tachycardia, and tachypnea.  Suspected sepsis due to viral gastroenteritis. Chest x-ray is without any acute infiltrate or consolidation.  Urinalysis not suggestive of UTI. -Follow-up blood cultures, check procalcitonin -Follow-up SARS-CoV-2 PCR -Follow-up C. difficile/GI pathogen panels -Continue IV fluid resuscitation overnight  Hyperkalemia: K 5.6 in possibly hemolyzed sample.  Will repeat potassium level.  Hyponatremia: Mild, likely from volume depletion from GI losses.  Will continue IV fluids overnight and recheck in a.m.  Hyperbilirubinemia: With AST 75, ALT 38.  Likely contributed by alcohol use.  Will check abdominal ultrasound.  Type 2 diabetes: On Metformin as outpatient.  Will continue with sensitive SSI while in hospital.  Hypertension: On lisinopril-HCTZ as an outpatient, currently holding well awaiting repeat potassium level.  He is normotensive at time of admission.  Chronic lower back pain due to lumbar stenosis s/p decompression with neurogenic bladder/bowel/gait: Reports requiring wheelchair for transport and inability to ambulate on own power. -Fall precautions -Continue gabapentin and tamsulosin  DVT prophylaxis: Lovenox Code Status: Full code, confirmed with Family Communication: Discussed with patient, he has discussed with family Disposition Plan: Pending clinical progress Consults called: None Admission status: Admit - It is my clinical opinion that admission to INPATIENT is reasonable and necessary because of the expectation that this  patient will require hospital care that crosses at least 2 midnights to treat this condition based on the medical complexity of the problems presented.  Given the aforementioned information, the predictability of an adverse outcome is felt to be significant.   Zada Finders MD [facility]  If 7PM-7AM, please contact night-coverage [email or web address]  05/11/2019, 10:10 PM

## 2019-05-11 NOTE — ED Triage Notes (Addendum)
Pt to ED via EMS from home d/t N/V/D., fever, weakness, cough that started yesterday. No known COVID exposure. Febrile. Last VS EMS Temp 102.9, bp 155/83, p 130, RR 24-30, 95%RA. #18 LAC, 1000MG  Tylenol and 350 ml NS Bolus given by EMS. A/OX 4. cbg 169, HX HTN, DM

## 2019-05-12 ENCOUNTER — Inpatient Hospital Stay (HOSPITAL_COMMUNITY): Payer: Medicare Other

## 2019-05-12 ENCOUNTER — Encounter (HOSPITAL_COMMUNITY): Payer: Self-pay | Admitting: Internal Medicine

## 2019-05-12 DIAGNOSIS — R509 Fever, unspecified: Secondary | ICD-10-CM

## 2019-05-12 DIAGNOSIS — R05 Cough: Secondary | ICD-10-CM | POA: Diagnosis not present

## 2019-05-12 DIAGNOSIS — E871 Hypo-osmolality and hyponatremia: Secondary | ICD-10-CM | POA: Diagnosis not present

## 2019-05-12 DIAGNOSIS — I10 Essential (primary) hypertension: Secondary | ICD-10-CM | POA: Diagnosis not present

## 2019-05-12 DIAGNOSIS — A419 Sepsis, unspecified organism: Secondary | ICD-10-CM | POA: Diagnosis not present

## 2019-05-12 DIAGNOSIS — E1159 Type 2 diabetes mellitus with other circulatory complications: Secondary | ICD-10-CM | POA: Diagnosis not present

## 2019-05-12 LAB — URINE CULTURE

## 2019-05-12 LAB — BASIC METABOLIC PANEL
Anion gap: 10 (ref 5–15)
BUN: 10 mg/dL (ref 8–23)
CO2: 23 mmol/L (ref 22–32)
Calcium: 8 mg/dL — ABNORMAL LOW (ref 8.9–10.3)
Chloride: 101 mmol/L (ref 98–111)
Creatinine, Ser: 1.05 mg/dL (ref 0.61–1.24)
GFR calc Af Amer: >60 mL/min (ref >60)
GFR calc non Af Amer: >60 mL/min (ref >60)
Glucose, Bld: 139 mg/dL — ABNORMAL HIGH (ref 70–99)
Potassium: 3.6 mmol/L (ref 3.5–5.1)
Sodium: 134 mmol/L — ABNORMAL LOW (ref 135–145)

## 2019-05-12 LAB — GASTROINTESTINAL PANEL BY PCR, STOOL (REPLACES STOOL CULTURE)

## 2019-05-12 LAB — HEPATIC FUNCTION PANEL
ALT: 37 U/L (ref 0–44)
AST: 62 U/L — ABNORMAL HIGH (ref 15–41)
Albumin: 2.9 g/dL — ABNORMAL LOW (ref 3.5–5.0)
Alkaline Phosphatase: 37 U/L — ABNORMAL LOW (ref 38–126)
Bilirubin, Direct: 0.2 mg/dL (ref 0.0–0.2)
Indirect Bilirubin: 1.2 mg/dL — ABNORMAL HIGH (ref 0.3–0.9)
Total Bilirubin: 1.4 mg/dL — ABNORMAL HIGH (ref 0.3–1.2)
Total Protein: 5.2 g/dL — ABNORMAL LOW (ref 6.5–8.1)

## 2019-05-12 LAB — GLUCOSE, CAPILLARY
Glucose-Capillary: 125 mg/dL — ABNORMAL HIGH (ref 70–99)
Glucose-Capillary: 101 mg/dL — ABNORMAL HIGH (ref 70–99)
Glucose-Capillary: 170 mg/dL — ABNORMAL HIGH (ref 70–99)
Glucose-Capillary: 116 mg/dL — ABNORMAL HIGH (ref 70–99)
Glucose-Capillary: 123 mg/dL — ABNORMAL HIGH (ref 70–99)

## 2019-05-12 LAB — CBC
HCT: 36.6 % — ABNORMAL LOW (ref 39.0–52.0)
Hemoglobin: 12.4 g/dL — ABNORMAL LOW (ref 13.0–17.0)
MCH: 31.6 pg (ref 26.0–34.0)
MCHC: 33.9 g/dL (ref 30.0–36.0)
MCV: 93.1 fL (ref 80.0–100.0)
Platelets: 171 10*3/uL (ref 150–400)
RBC: 3.93 MIL/uL — ABNORMAL LOW (ref 4.22–5.81)
RDW: 13.1 % (ref 11.5–15.5)
WBC: 19 10*3/uL — ABNORMAL HIGH (ref 4.0–10.5)
nRBC: 0 % (ref 0.0–0.2)

## 2019-05-12 LAB — PROCALCITONIN: Procalcitonin: 8.94 ng/mL

## 2019-05-12 LAB — C DIFFICILE QUICK SCREEN W PCR REFLEX
C Diff antigen: NEGATIVE
C Diff interpretation: NOT DETECTED
C Diff toxin: NEGATIVE

## 2019-05-12 LAB — MAGNESIUM: Magnesium: 1.1 mg/dL — ABNORMAL LOW (ref 1.7–2.4)

## 2019-05-12 LAB — ECHOCARDIOGRAM COMPLETE
Height: 69 in
Weight: 3488 oz

## 2019-05-12 LAB — OSMOLALITY: Osmolality: 275 mOsm/kg (ref 275–295)

## 2019-05-12 MED ORDER — ALUM & MAG HYDROXIDE-SIMETH 200-200-20 MG/5ML PO SUSP
30.0000 mL | ORAL | Status: DC | PRN
  Administered 2019-05-12: 02:00:00 30 mL via ORAL
  Filled 2019-05-12: qty 30

## 2019-05-12 MED ORDER — GABAPENTIN 600 MG PO TABS
300.0000 mg | ORAL_TABLET | Freq: Two times a day (BID) | ORAL | Status: DC
  Administered 2019-05-12 – 2019-05-13 (×2): 300 mg via ORAL
  Filled 2019-05-12 (×2): qty 1

## 2019-05-12 MED ORDER — SODIUM CHLORIDE 0.9 % IV SOLN
INTRAVENOUS | Status: DC
  Administered 2019-05-12: 1 mL via INTRAVENOUS

## 2019-05-12 NOTE — Progress Notes (Signed)
  Echocardiogram 2D Echocardiogram has been performed.  Donald Calderon 05/12/2019, 2:43 PM

## 2019-05-12 NOTE — Progress Notes (Addendum)
Progress Note    Donald Calderon  [id number] DOB: 03/31/1941  DOA: 05/11/2019 PCP: [name], NP      Brief Narrative:    Medical records reviewed and are as summarized below:  Donald Calderon is an 78 y.o. male with medical history significant for type 2 diabetes, hypertension, depression with anxiety, chronic lumbar back/stenosis pain s/p decompression with neurogenic bladder/bowel/gait, and alcohol use.  He presented to the hospital because of nausea, vomiting and diarrhea.  He said he had been eating salad for the past week or so.  He had fever and leukocytosis on admission.      Assessment/Plan:   Principal Problem:   Sepsis due to undetermined organism [facility]) Active Problems:   Diabetes ([place])   Hypertension associated with diabetes ([place])   Hyponatremia   Hyperkalemia   Hyperbilirubinemia   Body mass index is 32.19 kg/m.   Fever, leukocytosis, SIRS, ?sepsis: Likely due to gastroenteritis.  Hold off IV antibiotics for now.  Follow-up blood cultures.  Obtain 2D echo for further evaluation.  Recent vomiting and diarrhea: Probable gastroenteritis.  Unknown if this contributed to fever or leukocytosis.  Hyponatremia: Improved.  Continue IV fluids at a lower rate.  Hyperkalemia: Resolved  Type 2 diabetes mellitus: Metformin on hold.  NovoLog as needed for hyperglycemia  Hypertension: Lisinopril HCTZ on hold.  Chronic low back pain/lumbar stenosis: Analgesics as needed for pain.  Neurogenic bladder: Continue Flomax   Family Communication/Anticipated D/C date and plan/Code Status   DVT prophylaxis: Lovenox Code Status: Full code Family Communication: Plan discussed with the patient Disposition Plan: Home in 1 to 2 days depending on clinical improvement.      Subjective:   He had vomiting and diarrhea yesterday but no vomiting or diarrhea today.  No cough, shortness of breath, chest pain or any urinary symptoms.  He had fever yesterday but  none today.  Objective:    Vitals:   05/11/19 1927 05/11/19 2315 05/12/19 0143 05/12/19 0605  BP:  (!) 146/86 135/79 (!) 146/78  Pulse:  85 79 73  Resp:   18 18  Temp: 99.7 F (37.6 C)  98.4 F (36.9 C) 97.7 F (36.5 C)  TempSrc: Oral  Oral Oral  SpO2:  100% 100% 100%  Weight:      Height:        Intake/Output Summary (Last 24 hours) at 05/12/2019 1343 Last data filed at 05/12/2019 1000 Gross per 24 hour  Intake 890.55 ml  Output 475 ml  Net 415.55 ml   Filed Weights   05/11/19 1638  Weight: 98.9 kg    Exam:  GEN: NAD SKIN: No rash EYES: EOMI ENT: MMM CV: RRR PULM: CTA B ABD: soft, ND, NT, +BS CNS: AAO x 3, non focal EXT: No edema or tenderness   Data Reviewed:   I have personally reviewed following labs and imaging studies:  Labs: Labs show the following:   Basic Metabolic Panel: Recent Labs  Lab 05/11/19 1653 05/11/19 2006 05/12/19 0219  NA 129* 133* 134*  K 5.6* 3.5 3.6  CL 95* 98 101  CO2 20*  --  23  GLUCOSE 194* 118* 139*  BUN 11 11 10   CREATININE 1.13 1.00 1.05  CALCIUM 8.4*  --  8.0*  MG  --   --  1.1*   GFR Estimated Creatinine Clearance: 67.2 mL/min (by C-G formula based on SCr of 1.05 mg/dL). Liver Function Tests: Recent Labs  Lab 05/11/19 1653 05/12/19 [id number]  AST 75* 62*  ALT 38 37  ALKPHOS 43 37*  BILITOT 2.4* 1.4*  PROT 5.3* 5.2*  ALBUMIN 3.3* 2.9*   No results for input(s): LIPASE, AMYLASE in the last 168 hours. No results for input(s): AMMONIA in the last 168 hours. Coagulation profile Recent Labs  Lab 05/11/19 1813  INR 1.1    CBC: Recent Labs  Lab 05/11/19 1813 05/11/19 2006 05/12/19 0219  WBC 16.1*  --  19.0*  HGB 13.5 13.3 12.4*  HCT 38.5* 39.0 36.6*  MCV 93.0  --  93.1  PLT 161  --  171   Cardiac Enzymes: No results for input(s): CKTOTAL, CKMB, CKMBINDEX, TROPONINI in the last 168 hours. BNP (last 3 results) No results for input(s): PROBNP in the last 8760 hours. CBG: Recent Labs  Lab  05/12/19 0151 05/12/19 0730 05/12/19 1120  GLUCAP 125* 101* 170*   D-Dimer: No results for input(s): DDIMER in the last 72 hours. Hgb A1c: Recent Labs    05/11/19 1813  HGBA1C 5.8*   Lipid Profile: No results for input(s): CHOL, HDL, LDLCALC, TRIG, CHOLHDL, LDLDIRECT in the last 72 hours. Thyroid function studies: No results for input(s): TSH, T4TOTAL, T3FREE, THYROIDAB in the last 72 hours.  Invalid input(s): FREET3 Anemia work up: No results for input(s): VITAMINB12, FOLATE, FERRITIN, TIBC, IRON, RETICCTPCT in the last 72 hours. Sepsis Labs: Recent Labs  Lab 05/11/19 1653 05/11/19 1745 05/11/19 1813 05/12/19 0219  PROCALCITON  --   --   --  8.94  WBC  --   --  16.1* 19.0*  LATICACIDVEN 1.9 1.4  --   --     Microbiology Recent Results (from the past 240 hour(s))  Blood Culture (routine x 2)     Status: None (Preliminary result)   Collection Time: 05/11/19  5:32 PM   Specimen: BLOOD  Result Value Ref Range Status   Specimen Description BLOOD LEFT ANTECUBITAL  Final   Special Requests   Final    BOTTLES DRAWN AEROBIC AND ANAEROBIC Blood Culture adequate volume   Culture   Final    NO GROWTH < 24 HOURS Performed at [facility], [place] [address], [place], [place] [address]    Report Status PENDING  Incomplete  SARS CORONAVIRUS 2 (TAT 6-24 HRS) Nasopharyngeal Nasopharyngeal Swab     Status: None   Collection Time: 05/11/19  6:20 PM   Specimen: Nasopharyngeal Swab  Result Value Ref Range Status   SARS Coronavirus 2 NEGATIVE NEGATIVE Final    Comment: (NOTE) SARS-CoV-2 target nucleic acids are NOT DETECTED. The SARS-CoV-2 RNA is generally detectable in upper and lower respiratory specimens during the acute phase of infection. Negative results do not preclude SARS-CoV-2 infection, do not rule out co-infections with other pathogens, and should not be used as the sole basis for treatment or other patient management decisions. Negative results must be combined  with clinical observations, patient history, and epidemiological information. The expected result is Negative. Fact Sheet for Patients: SugarRoll.be Fact Sheet for Healthcare Providers: https://www.woods-mathews.com/ This test is not yet approved or cleared by the [place] FDA and  has been authorized for detection and/or diagnosis of SARS-CoV-2 by FDA under an Emergency Use Authorization (EUA). This EUA will remain  in effect (meaning this test can be used) for the duration of the COVID-19 declaration under Section 56 4(b)(1) of the Act, 21 U.S.C. section 360bbb-3(b)(1), unless the authorization is terminated or revoked sooner. Performed at [facility], [place],  [place] [address]   Blood Culture (routine x 2)     Status: None (Preliminary result)   Collection Time: 05/11/19  7:20 PM   Specimen: BLOOD  Result Value Ref Range Status   Specimen Description BLOOD LEFT ANTECUBITAL  Final   Special Requests   Final    AEROBIC BOTTLE ONLY Blood Culture results may not be optimal due to an inadequate volume of blood received in culture bottles   Culture   Final    NO GROWTH < 24 HOURS Performed at [facility], [place] [address], [place], [place] [address]    Report Status PENDING  Incomplete  Gastrointestinal Panel by PCR , Stool     Status: None   Collection Time: 05/11/19  9:27 PM   Specimen: Stool  Result Value Ref Range Status   Campylobacter species NOT DETECTED NOT DETECTED Final   Plesimonas shigelloides NOT DETECTED NOT DETECTED Final   Salmonella species NOT DETECTED NOT DETECTED Final   Yersinia enterocolitica NOT DETECTED NOT DETECTED Final   Vibrio species NOT DETECTED NOT DETECTED Final   Vibrio cholerae NOT DETECTED NOT DETECTED Final   Enteroaggregative E coli (EAEC) NOT DETECTED NOT DETECTED Final   Enteropathogenic E coli (EPEC) NOT DETECTED NOT DETECTED Final   Enterotoxigenic E coli (ETEC)  NOT DETECTED NOT DETECTED Final   Shiga like toxin producing E coli (STEC) NOT DETECTED NOT DETECTED Final   Shigella/Enteroinvasive E coli (EIEC) NOT DETECTED NOT DETECTED Final   Cryptosporidium NOT DETECTED NOT DETECTED Final   Cyclospora cayetanensis NOT DETECTED NOT DETECTED Final   Entamoeba histolytica NOT DETECTED NOT DETECTED Final   Giardia lamblia NOT DETECTED NOT DETECTED Final   Adenovirus F40/41 NOT DETECTED NOT DETECTED Final   Astrovirus NOT DETECTED NOT DETECTED Final   Norovirus GI/GII NOT DETECTED NOT DETECTED Final   Rotavirus A NOT DETECTED NOT DETECTED Final   Sapovirus (I, II, IV, and V) NOT DETECTED NOT DETECTED Final    Comment: Performed at [facility], [address] [place], [place] [address]    Procedures and diagnostic studies:  US Abdomen Complete  Result Date: 05/11/2019 CLINICAL DATA:  Hyperbilirubinemia, nausea, vomiting EXAM: ABDOMEN ULTRASOUND COMPLETE COMPARISON:  Renal ultrasound 02/25/2017 FINDINGS: Gallbladder: No gallstones or wall thickening visualized. No sonographic Murphy sign noted by sonographer. Common bile duct: Diameter: 7.7 mm, upper limits of normal for patient's age. Liver: No focal lesion identified. Within normal limits in parenchymal echogenicity. Portal vein is patent on color Doppler imaging with normal direction of blood flow towards the liver. IVC: No abnormality visualized. Pancreas: Poorly visualized due to extensive bowel gas. Spleen: Size and appearance within normal limits. Right Kidney: Length: 12.6 cm. 7 mm echogenic shadowing calcification in the lower pole right kidney. No hydronephrosis or visible concerning renal mass. Left Kidney: Length: 11.5 cm. 6 mm echogenic, shadowing calculus in the lower pole left kidney. No hydronephrosis or visible concerning renal mass. Abdominal aorta: Difficult visualization due to bowel gas. Proximal to mid aorta is not well visualized. Distally measures 1.8 cm. Other findings: None.  IMPRESSION: Technically difficult exam due to extensive bowel gas. Poor visualization of the pancreas and proximal aorta. Bilateral nonobstructive nephrolithiasis. Otherwise unremarkable abdominal ultrasound. Specifically, no frank biliary ductal dilatation. Electronically Signed   By: Lovena Le M.D.   On: 05/11/2019 23:21   Dg Chest Port 1 View  Result Date: 05/11/2019 CLINICAL DATA:  Fever and cough. EXAM: PORTABLE CHEST 1 VIEW COMPARISON:  03/07/2017. FINDINGS: Cardiac silhouette  is normal in size. No mediastinal or hilar masses. No evidence of adenopathy. Small area of opacity is noted at the right lateral lung base, stable from prior study consistent scarring. Lungs otherwise clear. No pleural effusion or pneumothorax. Skeletal structures are grossly intact. IMPRESSION: No active disease. Electronically Signed   By: Lajean Manes M.D.   On: 05/11/2019 17:22    Medications:    enoxaparin (LOVENOX) injection  40 mg Subcutaneous Q24H   gabapentin  600 mg Oral QHS   gabapentin  300 mg Oral BID WC   insulin aspart  0-9 Units Subcutaneous TID WC   pravastatin  10 mg Oral QPM   tamsulosin  0.4 mg Oral BID   Continuous Infusions:   LOS: 1 day   Slaton Reaser  [facility]   *Please refer to [email or web address], password [email or web address] to get updated schedule on who will round on this patient, as hospitalists switch teams weekly. If 7PM-7AM, please contact night-coverage at [email or web address], password [email or web address] for any overnight needs.  05/12/2019, 1:43 PM

## 2019-05-13 DIAGNOSIS — E871 Hypo-osmolality and hyponatremia: Secondary | ICD-10-CM | POA: Diagnosis not present

## 2019-05-13 DIAGNOSIS — E1159 Type 2 diabetes mellitus with other circulatory complications: Secondary | ICD-10-CM | POA: Diagnosis not present

## 2019-05-13 DIAGNOSIS — R05 Cough: Secondary | ICD-10-CM | POA: Diagnosis not present

## 2019-05-13 DIAGNOSIS — E875 Hyperkalemia: Secondary | ICD-10-CM

## 2019-05-13 DIAGNOSIS — A419 Sepsis, unspecified organism: Secondary | ICD-10-CM | POA: Diagnosis not present

## 2019-05-13 LAB — GLUCOSE, CAPILLARY: Glucose-Capillary: 120 mg/dL — ABNORMAL HIGH (ref 70–99)

## 2019-05-13 NOTE — Discharge Summary (Signed)
Physician Discharge Summary  Donald Calderon [id number] DOB: June 19, 1940 DOA: 05/11/2019  PCP: [name], NP  Admit date: 05/11/2019 Discharge date: 05/13/2019  Discharge disposition: Home   Recommendations for Outpatient Follow-Up:   Outpatient follow-up with PCP in 1 to 2 weeks.  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  Patient is aware of this finding on 2D echo.   Discharge Diagnosis:   Principal Problem:   Sepsis due to undetermined organism [facility]) Active Problems:   Diabetes ([place])   Hypertension associated with diabetes ([place])   Hyponatremia   Hyperkalemia   Hyperbilirubinemia    Discharge Condition: Stable.  Diet recommendation: Low-salt and carb modified diet  Code status: Full code.    Hospital Course:    Donald Calderon is an 78 y.o. male with medical history significant fortype 2 diabetes, hypertension, depression with anxiety,chronic lumbar back/stenosis pain s/p decompression with neurogenic bladder,and alcohol use.  He presented to the hospital because of nausea, vomiting and diarrhea.  He said he had been eating salad for the past week or so.  He had fever and leukocytosis on admission He was admitted to the hospital for sepsis secondary to gastroenteritis.  He was not treated with IV antibiotics.  He was treated with IV fluids.  He had hyperkalemia on admission but this has resolved.  He also had hyponatremia which has improved with IV fluids.  Blood culture did not show any growth.  2D echo did not show any evidence of vegetation.  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  All his symptoms have resolved and he is deemed stable for discharge to home today.  Follow-up with his PCP is recommended for routine health maintenance.  He was also informed about the findings on 2D echo and the need for periodic monitoring of the  ascending aorta diameter.  Addendum  Of note, patient was found to have hypomagnesemia which is likely from recent vomiting and diarrhea.  A prescription for magnesium oxide 400 mg twice daily for 5 days was called to his pharmacy for pickup ([facility] in [place] on [address]  Patient was informed to pick this up from the pharmacy.     Medical Consultants:    None.   Discharge Exam:   Vitals:   05/12/19 1517 05/12/19 2203  BP: 137/83 (!) 148/80  Pulse: 79 66  Resp: 18 18  Temp: 98.1 F (36.7 C) 97.9 F (36.6 C)  SpO2: 100% 100%   Vitals:   05/12/19 0605 05/12/19 1517 05/12/19 2203 05/13/19 0500  BP: (!) 146/78 137/83 (!) 148/80   Pulse: 73 79 66   Resp: 18 18 18    Temp: 97.7 F (36.5 C) 98.1 F (36.7 C) 97.9 F (36.6 C)   TempSrc: Oral Oral Oral   SpO2: 100% 100% 100%   Weight:    94.9 kg  Height:         GEN: NAD SKIN: No rash EYES: EOMI ENT: MMM CV: RRR PULM: CTA B ABD: soft, ND, NT, +BS CNS: AAO x 3, non focal EXT: No edema or tenderness   The results of significant diagnostics from this hospitalization (including imaging, microbiology, ancillary and laboratory) are listed below for reference.     Procedures and Diagnostic Studies:   US Abdomen Complete  Result Date: 05/11/2019 CLINICAL DATA:  Hyperbilirubinemia, nausea, vomiting EXAM:  ABDOMEN ULTRASOUND COMPLETE COMPARISON:  Renal ultrasound 02/25/2017 FINDINGS: Gallbladder: No gallstones or wall thickening visualized. No sonographic Murphy sign noted by sonographer. Common bile duct: Diameter: 7.7 mm, upper limits of normal for patient's age. Liver: No focal lesion identified. Within normal limits in parenchymal echogenicity. Portal vein is patent on color Doppler imaging with normal direction of blood flow towards the liver. IVC: No abnormality visualized. Pancreas: Poorly visualized due to extensive bowel gas. Spleen: Size and appearance within normal limits. Right Kidney: Length:  12.6 cm. 7 mm echogenic shadowing calcification in the lower pole right kidney. No hydronephrosis or visible concerning renal mass. Left Kidney: Length: 11.5 cm. 6 mm echogenic, shadowing calculus in the lower pole left kidney. No hydronephrosis or visible concerning renal mass. Abdominal aorta: Difficult visualization due to bowel gas. Proximal to mid aorta is not well visualized. Distally measures 1.8 cm. Other findings: None. IMPRESSION: Technically difficult exam due to extensive bowel gas. Poor visualization of the pancreas and proximal aorta. Bilateral nonobstructive nephrolithiasis. Otherwise unremarkable abdominal ultrasound. Specifically, no frank biliary ductal dilatation. Electronically Signed   By: Lovena Le M.D.   On: 05/11/2019 23:21   Dg Chest Port 1 View  Result Date: 05/11/2019 CLINICAL DATA:  Fever and cough. EXAM: PORTABLE CHEST 1 VIEW COMPARISON:  03/07/2017. FINDINGS: Cardiac silhouette is normal in size. No mediastinal or hilar masses. No evidence of adenopathy. Small area of opacity is noted at the right lateral lung base, stable from prior study consistent scarring. Lungs otherwise clear. No pleural effusion or pneumothorax. Skeletal structures are grossly intact. IMPRESSION: No active disease. Electronically Signed   By: Lajean Manes M.D.   On: 05/11/2019 17:22     Labs:   Basic Metabolic Panel: Recent Labs  Lab 05/11/19 1653 05/11/19 2006 05/12/19 0219  NA 129* 133* 134*  K 5.6* 3.5 3.6  CL 95* 98 101  CO2 20*  --  23  GLUCOSE 194* 118* 139*  BUN 11 11 10   CREATININE 1.13 1.00 1.05  CALCIUM 8.4*  --  8.0*  MG  --   --  1.1*   GFR Estimated Creatinine Clearance: 65.9 mL/min (by C-G formula based on SCr of 1.05 mg/dL). Liver Function Tests: Recent Labs  Lab 05/11/19 1653 05/12/19 0219  AST 75* 62*  ALT 38 37  ALKPHOS 43 37*  BILITOT 2.4* 1.4*  PROT 5.3* 5.2*  ALBUMIN 3.3* 2.9*   No results for input(s): LIPASE, AMYLASE in the last 168 hours. No  results for input(s): AMMONIA in the last 168 hours. Coagulation profile Recent Labs  Lab 05/11/19 1813  INR 1.1    CBC: Recent Labs  Lab 05/11/19 1813 05/11/19 2006 05/12/19 0219  WBC 16.1*  --  19.0*  HGB 13.5 13.3 12.4*  HCT 38.5* 39.0 36.6*  MCV 93.0  --  93.1  PLT 161  --  171   Cardiac Enzymes: No results for input(s): CKTOTAL, CKMB, CKMBINDEX, TROPONINI in the last 168 hours. BNP: Invalid input(s): POCBNP CBG: Recent Labs  Lab 05/12/19 0730 05/12/19 1120 05/12/19 1645 05/12/19 2153 05/13/19 0743  GLUCAP 101* 170* 116* 123* 120*   D-Dimer No results for input(s): DDIMER in the last 72 hours. Hgb A1c Recent Labs    05/11/19 1813  HGBA1C 5.8*   Lipid Profile No results for input(s): CHOL, HDL, LDLCALC, TRIG, CHOLHDL, LDLDIRECT in the last 72 hours. Thyroid function studies No results for input(s): TSH, T4TOTAL, T3FREE, THYROIDAB in the last 72 hours.  Invalid input(s): FREET3 Anemia  work up No results for input(s): VITAMINB12, FOLATE, FERRITIN, TIBC, IRON, RETICCTPCT in the last 72 hours. Microbiology Recent Results (from the past 240 hour(s))  Blood Culture (routine x 2)     Status: None (Preliminary result)   Collection Time: 05/11/19  5:32 PM   Specimen: BLOOD  Result Value Ref Range Status   Specimen Description BLOOD LEFT ANTECUBITAL  Final   Special Requests   Final    BOTTLES DRAWN AEROBIC AND ANAEROBIC Blood Culture adequate volume   Culture   Final    NO GROWTH < 24 HOURS Performed at [facility], [address] [place], [place] [address]    Report Status PENDING  Incomplete  SARS CORONAVIRUS 2 (TAT 6-24 HRS) Nasopharyngeal Nasopharyngeal Swab     Status: None   Collection Time: 05/11/19  6:20 PM   Specimen: Nasopharyngeal Swab  Result Value Ref Range Status   SARS Coronavirus 2 NEGATIVE NEGATIVE Final    Comment: (NOTE) SARS-CoV-2 target nucleic acids are NOT DETECTED. The SARS-CoV-2 RNA is generally detectable in upper and  lower respiratory specimens during the acute phase of infection. Negative results do not preclude SARS-CoV-2 infection, do not rule out co-infections with other pathogens, and should not be used as the sole basis for treatment or other patient management decisions. Negative results must be combined with clinical observations, patient history, and epidemiological information. The expected result is Negative. Fact Sheet for Patients: SugarRoll.be Fact Sheet for Healthcare Providers: https://www.woods-mathews.com/ This test is not yet approved or cleared by the [place] FDA and  has been authorized for detection and/or diagnosis of SARS-CoV-2 by FDA under an Emergency Use Authorization (EUA). This EUA will remain  in effect (meaning this test can be used) for the duration of the COVID-19 declaration under Section 56 4(b)(1) of the Act, 21 U.S.C. section 360bbb-3(b)(1), unless the authorization is terminated or revoked sooner. Performed at [facility], [place] [address], [place], [place] [address]   Blood Culture (routine x 2)     Status: None (Preliminary result)   Collection Time: 05/11/19  7:20 PM   Specimen: BLOOD  Result Value Ref Range Status   Specimen Description BLOOD LEFT ANTECUBITAL  Final   Special Requests   Final    AEROBIC BOTTLE ONLY Blood Culture results may not be optimal due to an inadequate volume of blood received in culture bottles   Culture   Final    NO GROWTH < 24 HOURS Performed at [facility], [place] [address], [place], [place] [address]    Report Status PENDING  Incomplete  Urine culture     Status: Abnormal   Collection Time: 05/11/19  7:35 PM   Specimen: In/Out Cath Urine  Result Value Ref Range Status   Specimen Description IN/OUT CATH URINE  Final   Special Requests   Final    NONE Performed at [facility], [place] [address] [place], [place] [address]    Culture MULTIPLE SPECIES PRESENT,  SUGGEST RECOLLECTION (A)  Final   Report Status 05/12/2019 FINAL  Final  Gastrointestinal Panel by PCR , Stool     Status: None   Collection Time: 05/11/19  9:27 PM   Specimen: Stool  Result Value Ref Range Status   Campylobacter species NOT DETECTED NOT DETECTED Final   Plesimonas shigelloides NOT DETECTED NOT DETECTED Final   Salmonella species NOT DETECTED NOT DETECTED Final   Yersinia enterocolitica NOT DETECTED NOT DETECTED Final   Vibrio species  NOT DETECTED NOT DETECTED Final   Vibrio cholerae NOT DETECTED NOT DETECTED Final   Enteroaggregative E coli (EAEC) NOT DETECTED NOT DETECTED Final   Enteropathogenic E coli (EPEC) NOT DETECTED NOT DETECTED Final   Enterotoxigenic E coli (ETEC) NOT DETECTED NOT DETECTED Final   Shiga like toxin producing E coli (STEC) NOT DETECTED NOT DETECTED Final   Shigella/Enteroinvasive E coli (EIEC) NOT DETECTED NOT DETECTED Final   Cryptosporidium NOT DETECTED NOT DETECTED Final   Cyclospora cayetanensis NOT DETECTED NOT DETECTED Final   Entamoeba histolytica NOT DETECTED NOT DETECTED Final   Giardia lamblia NOT DETECTED NOT DETECTED Final   Adenovirus F40/41 NOT DETECTED NOT DETECTED Final   Astrovirus NOT DETECTED NOT DETECTED Final   Norovirus GI/GII NOT DETECTED NOT DETECTED Final   Rotavirus A NOT DETECTED NOT DETECTED Final   Sapovirus (I, II, IV, and V) NOT DETECTED NOT DETECTED Final    Comment: Performed at [facility], [address] [place], [place] [address]  C difficile quick scan w PCR reflex     Status: None   Collection Time: 05/12/19  9:56 AM   Specimen: Stool  Result Value Ref Range Status   C Diff antigen NEGATIVE NEGATIVE Final   C Diff toxin NEGATIVE NEGATIVE Final   C Diff interpretation No C. difficile detected.  Final    Comment: Performed at [facility], [place] [address], [place], [place] [address]     Discharge Instructions:   Discharge Instructions    Diet - low sodium heart healthy   Complete  by: As directed    Diet Carb Modified   Complete by: As directed    Increase activity slowly   Complete by: As directed      Allergies as of 05/13/2019      Reactions   Penicillins Itching, Rash   Has patient had a PCN reaction causing immediate rash, facial/tongue/throat swelling, SOB or lightheadedness with hypotension: No Has patient had a PCN reaction causing severe rash involving mucus membranes or skin necrosis: No Has patient had a PCN reaction that required hospitalization: No Has patient had a PCN reaction occurring within the last 10 years: No If all of the above answers are "NO", then may proceed with Cephalosporin use. Tolerated Ancef      Medication List    STOP taking these medications   HYDROcodone-acetaminophen 10-325 MG tablet Commonly known as: NORCO   Levemir 100 UNIT/ML injection Generic drug: insulin detemir   traMADol 50 MG tablet Commonly known as: ULTRAM     TAKE these medications   acetaminophen 325 MG tablet Commonly known as: TYLENOL Take 1-2 tablets (325-650 mg total) by mouth every 4 (four) hours as needed for mild pain.   albuterol (2.5 MG/3ML) 0.083% nebulizer solution Commonly known as: PROVENTIL Take 3 mLs (2.5 mg total) by nebulization every 2 (two) hours as needed for wheezing.   amLODipine 10 MG tablet Commonly known as: NORVASC Take 1 tablet (10 mg total) by mouth daily.   bethanechol 25 MG tablet Commonly known as: URECHOLINE Take 1 tablet (25 mg total) by mouth 3 (three) times daily.   cyclobenzaprine 5 MG tablet Commonly known as: FLEXERIL Take 1 tablet (5 mg total) by mouth 3 (three) times daily as needed for muscle spasms.   diclofenac sodium 1 % Gel Commonly known as: VOLTAREN Apply 2 g topically 4 (four) times daily.   gabapentin 100 MG capsule Commonly known as: NEURONTIN Take 1 capsule (100 mg total)  by mouth 3 (three) times daily.   gabapentin 300 MG capsule Commonly known as: NEURONTIN Take 600 mg by mouth  at bedtime.   hydrocortisone 2.5 % rectal cream Commonly known as: ANUSOL-HC Place rectally 4 (four) times daily.   lisinopril-hydrochlorothiazide 20-25 MG tablet Commonly known as: ZESTORETIC Take 1 tablet by mouth daily.   metFORMIN 1000 MG tablet Commonly known as: GLUCOPHAGE Take 2,000 mg by mouth at bedtime.   metoprolol succinate 25 MG 24 hr tablet Commonly known as: TOPROL-XL Take 1 tablet (25 mg total) by mouth daily.   pantoprazole 40 MG tablet Commonly known as: PROTONIX Take 1 tablet (40 mg total) by mouth daily.   polyethylene glycol 17 g packet Commonly known as: MIRALAX / GLYCOLAX Take 17 g by mouth daily as needed.   pravastatin 10 MG tablet Commonly known as: PRAVACHOL Take 1 tablet (10 mg total) by mouth every evening.   SALONPAS EX Apply 1 patch topically daily as needed (pain/aches).   tamsulosin 0.4 MG Caps capsule Commonly known as: FLOMAX Take 1 capsule (0.4 mg total) by mouth 2 (two) times daily.         Time coordinating discharge: 28 minutes  Signed:  Marvelle Caudill  [facility] 05/13/2019, 9:06 AM

## 2019-05-15 ENCOUNTER — Telehealth: Payer: Self-pay

## 2019-05-15 NOTE — Telephone Encounter (Signed)
Transition Care Management Follow-up Telephone Call Date of discharge and from where: 05/13/2019, [facility]    Call placed to # [phone], message left with call back requested to this CM # [phone]

## 2019-05-16 ENCOUNTER — Telehealth: Payer: Self-pay

## 2019-05-16 LAB — CULTURE, BLOOD (ROUTINE X 2)
Culture: NO GROWTH
Culture: NO GROWTH
Special Requests: ADEQUATE

## 2019-05-16 NOTE — Telephone Encounter (Signed)
Transition Care Management Follow-up Telephone Call Date of discharge and from where: 05/13/2019, [facility].  Call placed to patient as PCP was noted at Juluis Mire, NP who is currently practicing at [place]. The patient stated that it has been year since he has seen her.  He currently has a PCP at [facility] and plans to follow up there.   Ms Oletta Lamas removed as patient's PCP.   He also noted that he is doing " just fine" since discharging home.

## 2019-05-24 ENCOUNTER — Other Ambulatory Visit: Payer: Self-pay

## 2019-05-24 ENCOUNTER — Ambulatory Visit: Payer: Medicare HMO | Admitting: Podiatry

## 2019-05-24 ENCOUNTER — Ambulatory Visit (INDEPENDENT_AMBULATORY_CARE_PROVIDER_SITE_OTHER): Payer: Medicare HMO

## 2019-05-24 ENCOUNTER — Encounter: Payer: Self-pay | Admitting: Podiatry

## 2019-05-24 VITALS — BP 185/91

## 2019-05-24 DIAGNOSIS — M79671 Pain in right foot: Secondary | ICD-10-CM

## 2019-05-24 DIAGNOSIS — M722 Plantar fascial fibromatosis: Secondary | ICD-10-CM

## 2019-05-24 DIAGNOSIS — M79672 Pain in left foot: Secondary | ICD-10-CM

## 2019-05-24 DIAGNOSIS — R601 Generalized edema: Secondary | ICD-10-CM | POA: Diagnosis not present

## 2019-05-25 ENCOUNTER — Encounter: Payer: Self-pay | Admitting: Podiatry

## 2019-05-25 ENCOUNTER — Other Ambulatory Visit: Payer: Self-pay | Admitting: Podiatry

## 2019-05-25 DIAGNOSIS — M722 Plantar fascial fibromatosis: Secondary | ICD-10-CM

## 2019-05-25 NOTE — Progress Notes (Signed)
Subjective:  Patient ID: Donald Calderon, male    DOB: 10-Oct-1940,  MRN: [id number]  Chief Complaint  Patient presents with  . Foot Pain    pt has bil foot pain, pain has been going on for about a couple of years, pt also states that both of his feet is elevated when walking    78 y.o. male presents with the above complaint.  Patient presents with bilateral heel pain as well as secondary complaint of bilateral swelling.  Patient states that this has been going on for couple years.  Patient has tried elevating it but as soon as he gets in dependent position the swelling comes right back.  There is a correlation between swelling and pain.  Patient states it is on and off.  It is dull achy in nature.  It is shooting pain especially in the heel.  Is worse when walking.  Patient is a diabetic with last A1c of 5.8.  He denies any treatment modalities.  He denies seeing any other podiatrist for this.   Review of Systems: Negative except as noted in the HPI. Denies N/V/F/Ch.  Past Medical History:  Diagnosis Date  . ALCOHOL ABUSE   . Anxiety   . Arthritis   . Depression   . Diabetes mellitus   . GERD (gastroesophageal reflux disease)   . Hypertension   . Pneumonia     Current Outpatient Medications:  .  acetaminophen (TYLENOL) 325 MG tablet, Take 1-2 tablets (325-650 mg total) by mouth every 4 (four) hours as needed for mild pain., Disp: , Rfl:  .  albuterol (PROVENTIL) (2.5 MG/3ML) 0.083% nebulizer solution, Take 3 mLs (2.5 mg total) by nebulization every 2 (two) hours as needed for wheezing., Disp: 75 mL, Rfl: 12 .  amLODipine (NORVASC) 10 MG tablet, Take 1 tablet (10 mg total) by mouth daily., Disp: 30 tablet, Rfl: 0 .  bethanechol (URECHOLINE) 25 MG tablet, Take 1 tablet (25 mg total) by mouth 3 (three) times daily., Disp: 90 tablet, Rfl: 1 .  Camphor-Menthol-Methyl Sal (SALONPAS EX), Apply 1 patch topically daily as needed (pain/aches)., Disp: , Rfl:  .  cyclobenzaprine (FLEXERIL) 5 MG  tablet, Take 1 tablet (5 mg total) by mouth 3 (three) times daily as needed for muscle spasms., Disp: 30 tablet, Rfl: 0 .  diclofenac sodium (VOLTAREN) 1 % GEL, Apply 2 g topically 4 (four) times daily., Disp: 1 Tube, Rfl: 0 .  gabapentin (NEURONTIN) 100 MG capsule, Take 1 capsule (100 mg total) by mouth 3 (three) times daily., Disp: 90 capsule, Rfl: 0 .  gabapentin (NEURONTIN) 300 MG capsule, Take 600 mg by mouth at bedtime., Disp: , Rfl:  .  hydrocortisone (ANUSOL-HC) 2.5 % rectal cream, Place rectally 4 (four) times daily., Disp: 30 g, Rfl: 0 .  lisinopril-hydrochlorothiazide (ZESTORETIC) 20-25 MG tablet, Take 1 tablet by mouth daily., Disp: , Rfl:  .  metFORMIN (GLUCOPHAGE) 1000 MG tablet, Take 2,000 mg by mouth at bedtime., Disp: , Rfl:  .  metoprolol succinate (TOPROL-XL) 25 MG 24 hr tablet, Take 1 tablet (25 mg total) by mouth daily., Disp: 90 tablet, Rfl: 0 .  polyethylene glycol (MIRALAX / GLYCOLAX) packet, Take 17 g by mouth daily as needed., Disp: 90 each, Rfl: 0 .  pravastatin (PRAVACHOL) 10 MG tablet, Take 1 tablet (10 mg total) by mouth every evening., Disp: 30 tablet, Rfl: 0 .  tamsulosin (FLOMAX) 0.4 MG CAPS capsule, Take 1 capsule (0.4 mg total) by mouth 2 (two) times daily., Disp:  60 capsule, Rfl: 0 .  pantoprazole (PROTONIX) 40 MG tablet, Take 1 tablet (40 mg total) by mouth daily., Disp: 30 tablet, Rfl: 0  Social History   Tobacco Use  Smoking Status Former Smoker  . Packs/day: 1.00  . Years: 39.00  . Pack years: 39.00  . Types: Cigarettes  . Quit date: 2000  . Years since quitting: 20.9  Smokeless Tobacco Former Systems developer  . Types: Chew  . Quit date: 09/26/1997    Allergies  Allergen Reactions  . Penicillins Itching and Rash    Has patient had a PCN reaction causing immediate rash, facial/tongue/throat swelling, SOB or lightheadedness with hypotension: No Has patient had a PCN reaction causing severe rash involving mucus membranes or skin necrosis: No Has patient had a  PCN reaction that required hospitalization: No Has patient had a PCN reaction occurring within the last 10 years: No If all of the above answers are "NO", then may proceed with Cephalosporin use.  Tolerated Ancef    Objective:   Vitals:   05/24/19 0932  BP: (!) 185/91   There is no height or weight on file to calculate BMI. Constitutional Well developed. Well nourished.  Vascular Dorsalis pedis pulses palpable bilaterally. Posterior tibial pulses palpable bilaterally. Capillary refill normal to all digits.  No cyanosis or clubbing noted. Pedal hair growth normal.  Neurologic Normal speech. Oriented to person, place, and time. Epicritic sensation to light touch grossly present bilaterally.  Dermatologic Nails well groomed and normal in appearance. No open wounds. No skin lesions. Generalized edema 2+ pitting noted across both lower extremity.  Orthopedic: Normal joint ROM without pain or crepitus bilaterally. No visible deformities. Tender to palpation at the calcaneal tuber bilaterally. No pain with calcaneal squeeze bilaterally. Ankle ROM adequate range of motion bilaterally. Silfverskiold Test: Negative bilaterally   Radiographs: Taken and reviewed. No acute fractures or dislocations. No evidence of stress fracture.  Plantar heel spur present. Posterior heel spur absent.  There is increasing soft tissue density and volume consistent with generalized edema.  Assessment:   1. Foot pain, bilateral   2. Plantar fasciitis of right foot   3. Plantar fasciitis of left foot   4. Generalized edema    Plan:  Patient was evaluated and treated and all questions answered.  Plantar Fasciitis, bilaterally - XR reviewed as above.  - Educated on icing and stretching. Instructions given.  - Injection delivered to the plantar fascia as below. - DME: Plantar Fascial Brace x2 - Pharmacologic management: None. educated on risks/benefits and proper taking of medication.  Bilateral  edema generalized -I explained to the patient the etiology and various treatment options associated for bilateral edema increasing aggressive elevation has come while his compression socks.  Patient agrees with this plan and will continue wearing the compression socks more often.  Procedure: Injection Tendon/Ligament Location: Bilateral plantar fascia at the glabrous junction; medial approach. Skin Prep: alcohol Injectate: 0.5 cc 0.5% marcaine plain, 0.5 cc of 1% Lidocaine, 0.5 cc kenalog 10. Disposition: Patient tolerated procedure well. Injection site dressed with a band-aid.  No follow-ups on file.

## 2019-06-23 ENCOUNTER — Ambulatory Visit: Payer: Self-pay | Admitting: Podiatry

## 2020-02-26 NOTE — Progress Notes (Deleted)
Cardiology Office Note   Date:  02/26/2020   ID:  Donald Calderon, DOB [birth year], MRN [id number]  PCP:  [facility]  Cardiologist:   Caydance Kuehnle Martinique, MD   No chief complaint on file.     History of Present Illness: Donald Calderon is a 79 y.o. male who is seen at the request of Donald Calderon for evaluation of aortic aneurysm, HTN, edema.[name] He was seen in the past by Donald Calderon. 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 He was seen by Donald Calderon and medical therapy with close follow up was recommended but he has been lost to cardiac follow up since then.    Past Medical History:  Diagnosis Date  . ALCOHOL ABUSE   . Anxiety   . Arthritis   . Depression   . Diabetes mellitus   . GERD (gastroesophageal reflux disease)   . Hypertension   . Pneumonia     Past Surgical History:  Procedure Laterality Date  . BACK SURGERY      disc fro picched nerve times 2  . EYE SURGERY Right    Gaucoma  . LUMBAR LAMINECTOMY/DECOMPRESSION MICRODISCECTOMY N/A 01/21/2017   Procedure: Decompression Lumbar 2-3;  Surgeon: Donald Schools, MD;  Location: [place];  Service: Orthopedics;  Laterality: N/A;  210 mins  . LUMBAR LAMINECTOMY/DECOMPRESSION MICRODISCECTOMY N/A 01/23/2017   Procedure: EXPLORATION AND EVACUATION OF HEMATOMA L2-L3;  Surgeon: Donald Schools, MD;  Location: [place];  Service: Orthopedics;  Laterality: N/A;  . PROSTATE SURGERY       Current Outpatient Medications  Medication Sig Dispense Refill  . acetaminophen (TYLENOL) 325 MG tablet Take 1-2 tablets (325-650 mg total) by mouth every 4 (four) hours as needed for mild pain.    [name] albuterol (PROVENTIL) (2.5 MG/3ML) 0.083% nebulizer solution Take 3 mLs (2.5 mg total) by nebulization every 2 (two) hours as needed for wheezing. 75 mL 12  . amLODipine (NORVASC) 10 MG tablet Take 1 tablet (10 mg total) by mouth daily. 30 tablet 0  .  bethanechol (URECHOLINE) 25 MG tablet Take 1 tablet (25 mg total) by mouth 3 (three) times daily. 90 tablet 1  . Camphor-Menthol-Methyl Sal (SALONPAS EX) Apply 1 patch topically daily as needed (pain/aches).    . cyclobenzaprine (FLEXERIL) 5 MG tablet Take 1 tablet (5 mg total) by mouth 3 (three) times daily as needed for muscle spasms. 30 tablet 0  . diclofenac sodium (VOLTAREN) 1 % GEL Apply 2 g topically 4 (four) times daily. 1 Tube 0  . gabapentin (NEURONTIN) 100 MG capsule Take 1 capsule (100 mg total) by mouth 3 (three) times daily. 90 capsule 0  . gabapentin (NEURONTIN) 300 MG capsule Take 600 mg by mouth at bedtime.    . hydrocortisone (ANUSOL-HC) 2.5 % rectal cream Place rectally 4 (four) times daily. 30 g 0  . lisinopril-hydrochlorothiazide (ZESTORETIC) 20-25 MG tablet Take 1 tablet by mouth daily.    . metFORMIN (GLUCOPHAGE) 1000 MG tablet Take 2,000 mg by mouth at bedtime.    . metoprolol succinate (TOPROL-XL) 25 MG 24 hr tablet Take 1 tablet (25 mg total) by mouth daily. 90 tablet 0  . pantoprazole (PROTONIX) 40 MG tablet Take 1 tablet (40 mg total) by mouth daily. 30 tablet 0  . polyethylene glycol (MIRALAX / GLYCOLAX) packet Take 17 g by mouth daily as needed. 90 each 0  .  pravastatin (PRAVACHOL) 10 MG tablet Take 1 tablet (10 mg total) by mouth every evening. 30 tablet 0  . tamsulosin (FLOMAX) 0.4 MG CAPS capsule Take 1 capsule (0.4 mg total) by mouth 2 (two) times daily. 60 capsule 0   No current facility-administered medications for this visit.    Allergies:   Penicillins    Social History:  The patient  reports that he quit smoking about 21 years ago. His smoking use included cigarettes. He has a 39.00 pack-year smoking history. He quit smokeless tobacco use about 22 years ago.  His smokeless tobacco use included chew. He reports current alcohol use of about 4.0 standard drinks of alcohol per week. He reports that he does not use drugs.   Family History:  The patient's  ***family history includes Diabetes in his father and mother.    ROS:  Please see the history of present illness.   Otherwise, review of systems are positive for {NONE DEFAULTED:18576::"none"}.   All other systems are reviewed and negative.    PHYSICAL EXAM: VS:  There were no vitals taken for this visit. , BMI There is no height or weight on file to calculate BMI. GEN: Well nourished, well developed, in no acute distress  HEENT: normal  Neck: no JVD, carotid bruits, or masses Cardiac: ***RRR; no murmurs, rubs, or gallops,no edema  Respiratory:  clear to auscultation bilaterally, normal work of breathing GI: soft, nontender, nondistended, + BS MS: no deformity or atrophy  Skin: warm and dry, no rash Neuro:  Strength and sensation are intact Psych: euthymic mood, full affect   EKG:  EKG {ACTION; IS/IS [id number]} ordered today. The ekg ordered today demonstrates ***   Recent Labs: 05/12/2019: ALT 37; BUN 10; Creatinine, Ser 1.05; Hemoglobin 12.4; Magnesium 1.1; Platelets 171; Potassium 3.6; Sodium 134    Lipid Panel    Component Value Date/Time   CHOL 173 05/27/2012 0952   TRIG 87.0 05/27/2012 0952   HDL 41.80 05/27/2012 0952   CHOLHDL 4 05/27/2012 0952   VLDL 17.4 05/27/2012 0952   LDLCALC 114 (H) 05/27/2012 0952      Wt Readings from Last 3 Encounters:  05/13/19 209 lb 3.5 oz (94.9 kg)  03/09/17 203 lb 9.6 oz (92.4 kg)  02/22/17 208 lb (94.3 kg)      Other studies Reviewed: Additional studies/ records that were reviewed today include:   Myoview 11/07/15:Study Highlights   Nuclear stress EF: 58%.  There was no ST segment deviation noted during stress.  This is a low risk study. Donald Calderon is no evidence of ischemia.  Defect 1: There is a small defect of mild severity present in the basal inferior location. 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  Echo 10/24/15: Study Conclusions   - Left ventricle: The cavity size was normal. Wall thickness was    increased in a pattern of mild LVH. Systolic function was normal.  The estimated ejection fraction was in the range of 55% to 60%.  Wall motion was normal; there were no regional wall motion  abnormalities. Doppler parameters are consistent with abnormal  left ventricular relaxation (grade 1 diastolic dysfunction).  - Aortic valve: There was mild regurgitation.  - Aortic root: The aortic root was moderately dilated.  - Left atrium: The atrium was mildly dilated.   Echo 05/12/19:  IMPRESSIONS    1. Left ventricular ejection fraction, by visual estimation, is 60 to  65%. The left ventricle has normal function. There is no left ventricular  hypertrophy.  2.  Global right ventricle has normal systolic function.The right  ventricular size is normal. No increase in right ventricular wall  thickness.  3. Left atrial size was normal.  4. Right atrial size was normal.  5. The pericardial effusion is anterior to the right ventricle.  6. Trivial pericardial effusion is present.  7. The mitral valve is normal in structure. No evidence of mitral valve  regurgitation. No evidence of mitral stenosis.  8. The tricuspid valve is normal in structure. Tricuspid valve  regurgitation is not demonstrated.  9. The aortic valve was not well visualized. Aortic valve regurgitation  is trivial. No evidence of aortic valve sclerosis or stenosis.  10. The pulmonic valve was normal in structure. Pulmonic valve  regurgitation is trivial.  11. Aortic dilatation noted.  12. There is moderate dilatation of the ascending aorta measuring 45 mm.  13. The inferior vena cava is normal in size with greater than 50%  respiratory variability, suggesting right atrial pressure of 3 mmHg.    CT 10/24/15: CLINICAL DATA:  Aortic dissection a techocardiography today, history diabetes mellitus, hypertension, former smoker  EXAM: CT ANGIOGRAPHY CHEST, ABDOMEN AND PELVIS  TECHNIQUE: Multidetector CT  imaging through the chest, abdomen and pelvis was performed using the standard protocol during bolus administration of intravenous contrast. Multiplanar reconstructed images and MIPs were obtained and reviewed to evaluate the vascular anatomy. Precontrast images of the thoracic aorta were obtained as well.  CONTRAST:  100 cc Isovue 370 IV  COMPARISON:  CT abdomen and pelvis 10/02/2015  FINDINGS: CTA CHEST FINDINGS  Few scattered atherosclerotic calcifications within the aorta and coronary arteries.  No high attenuation mural crescent identified within aorta to suggest intramural hemorrhage.  Aneurysmal dilatation ascending thoracic aorta, measures 4.4 cm transverse on image 26 and 4.7 cm transverse more proximally near the aortic root image 52.  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  This is an atypical location for isolated aortic dissection, question raised of whether this could be the result of a penetrating ulcer with secondary dissection.  No evidence of aortic rupture or periaortic infiltration.  Pulmonary arteries appear grossly patent on non targeted exam.  Tortuous innominate artery/RIGHT subclavian artery.  No thoracic adenopathy.  Atelectasis at posteromedial RIGHT lower lobe.  Remaining lungs clear.  No infiltrate, pleural effusion or pneumothorax.  Review of the MIP images confirms the above findings.  CTA ABDOMEN AND PELVIS FINDINGS  Abdominal aorta normal caliber with few scattered atherosclerotic calcifications.  No evidence of abdominal aortic aneurysm.  Celiac, SMA, IMA, and renal artery origins appear patent, with mild atherosclerotic calcifications noted at the renal artery origins.  Short segment of dissection at a dilated RIGHT common iliac artery 1.9 cm diameter, extending to common  iliac bifurcation.  No periaortic infiltration or hemorrhage.  Liver, gallbladder, spleen, pancreas, and kidneys normal appearance.  Fat attenuation LEFT adrenal mass 19 x 17 mm compatible with myelolipoma.  2.9 x 2.6 x 3.2 cm diameter indeterminate soft tissue mass RIGHT adrenal gland on postcontrast CT; prior noncontrast CT showed this mass measured 1.90 HU attenuation consistent with an adrenal adenoma.  Unremarkable bladder and ureters.  Prostatic enlargement, gland 5.4 x 4.6 x ~4.2 cm.  Stomach and bowel loops normal appearance.  No additional mass, adenopathy, free air or free fluid.  Prior lumbar fusion at L3-L4.  No acute osseous findings.  Review of the MIP  images confirms the above findings.  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  Additional short segment of dissection at a mildly dilated RIGHT common iliac artery.  Aneurysmal dilatation ascending thoracic aorta 4.7 cm diameter at aortic root, recommendation below. Recommend semi-annual imaging followup by CTA or MRA and referral to cardiothoracic surgery if not already obtained. This recommendation follows 2010 ACCF/AHA/AATS/ACR/ASA/SCA/SCAI/SIR/STS/SVM Guidelines for the Diagnosis and Management of Patients With Thoracic Aortic Disease. Circulation. 2010; 121: e266-e36  Prostatic enlargement.  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  Findings called to Donald. Johnsie Calderon on 10/24/2015 at 1205 hours.   Electronically Signed   By: Lavonia Dana M.D.   On: 10/24/2015 12:08   ASSESSMENT AND PLAN:  1.  ***   Current medicines are reviewed at length with the patient today.  The patient {ACTIONS; HAS/DOES NOT HAVE:19233} concerns regarding  medicines.  The following changes have been made:  {PLAN; NO CHANGE:13088:s}  Labs/ tests ordered today include: *** No orders of the defined types were placed in this encounter.    Disposition:   FU with *** in {gen number 2-82:[id number]} {Days to years:10300}  Signed, Rahul Malinak Martinique, MD  02/26/2020 8:32 AM    [facility] [address], [place], [place], [address] Phone [phone], Fax [phone]

## 2020-02-28 ENCOUNTER — Ambulatory Visit: Payer: Medicare HMO | Admitting: Cardiology

## 2020-03-24 NOTE — Progress Notes (Deleted)
Cardiology Office Note:    Date:  03/24/2020   ID:  Donald Calderon, DOB 29-Apr-1941, MRN [id number]  PCP:  [facility]  [facility] Cardiologist:  No primary care provider on file.  [facility] Electrophysiologist:  None   Referring MD: Drue Flirt, MD    History of Present Illness:    Donald Calderon is a 79 y.o. 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 Quentin Cornwall for evaluation of hypertension, LE edema, and ascending aortic aneurysm and descending aortic dissection.  Per review of the record, the patient was incidentally diagnosed with a fusiform aortic root and ascending aortic aneurysm measuring 4.7cm in 2017 as well as a descending aortic dissection measuring about 5.2cm in length with aortic size of about 3.1cm. He was evaluated by CT surgery who recommended continued BP control and close follow-up with serial imaging as patient asymptomatic with normal renal function. Has not had regular imaging based on my review with last CTA in our system in 2017.  TTE 04/2019: LVEF 60-65%, no significant valvular abnormalities, ascending aorta 4.5cm.   Past Medical History:  Diagnosis Date  . ALCOHOL ABUSE   . Anxiety   . Arthritis   . Depression   . Diabetes mellitus   . GERD (gastroesophageal reflux disease)   . Hypertension   . Pneumonia     Past Surgical History:  Procedure Laterality Date  . BACK SURGERY      disc fro picched nerve times 2  . EYE SURGERY Right    Gaucoma  . LUMBAR LAMINECTOMY/DECOMPRESSION MICRODISCECTOMY N/A 01/21/2017   Procedure: Decompression Lumbar 2-3;  Surgeon: Melina Schools, MD;  Location: [place];  Service: Orthopedics;  Laterality: N/A;  210 mins  . LUMBAR LAMINECTOMY/DECOMPRESSION MICRODISCECTOMY N/A 01/23/2017   Procedure: EXPLORATION AND EVACUATION OF HEMATOMA L2-L3;   Surgeon: Melina Schools, MD;  Location: [place];  Service: Orthopedics;  Laterality: N/A;  . PROSTATE SURGERY      Current Medications: No outpatient medications have been marked as taking for the 03/26/20 encounter (Appointment) with Freada Bergeron, MD.     Allergies:   Penicillins   Social History   Socioeconomic History  . Marital status: Married    Spouse name: Not on file  . Number of children: Not on file  . Years of education: Not on file  . Highest education level: Not on file  Occupational History  . Not on file  Tobacco Use  . Smoking status: Former Smoker    Packs/day: 1.00    Years: 39.00    Pack years: 39.00    Types: Cigarettes    Quit date: 2000    Years since quitting: 21.7  . Smokeless tobacco: Former Systems developer    Types: Sarina Ser    Quit date: 09/26/1997  Vaping Use  . Vaping Use: Never used  Substance and Sexual Activity  . Alcohol use: Yes    Alcohol/week: 4.0 standard drinks    Types: 4 Cans of beer per week    Comment: 4 cans of beer per day  . Drug use: No  . Sexual activity: Not on file  Other Topics Concern  . Not on file  Social History Narrative  . Not on file   Social Determinants of Health   Financial Resource Strain:   . Difficulty of Paying Living Expenses: Not  on file  Food Insecurity:   . Worried About Charity fundraiser in the Last Year: Not on file  . Ran Out of Food in the Last Year: Not on file  Transportation Needs:   . Lack of Transportation (Medical): Not on file  . Lack of Transportation (Non-Medical): Not on file  Physical Activity:   . Days of Exercise per Week: Not on file  . Minutes of Exercise per Session: Not on file  Stress:   . Feeling of Stress : Not on file  Social Connections:   . Frequency of Communication with Friends and Family: Not on file  . Frequency of Social Gatherings with Friends and Family: Not on file  . Attends Religious Services: Not on file  . Active Member of Clubs or Organizations: Not on  file  . Attends Archivist Meetings: Not on file  . Marital Status: Not on file     Family History: The patient's ***family history includes Diabetes in his father and mother.  ROS:   Please see the history of present illness.    *** All other systems reviewed and are negative.  EKGs/Labs/Other Studies Reviewed:    The following studies were reviewed today: TTE 05/17/19: IMPRESSIONS  1. Left ventricular ejection fraction, by visual estimation, is 60 to  65%. The left ventricle has normal function. There is no left ventricular  hypertrophy.  2. Global right ventricle has normal systolic function.The right  ventricular size is normal. No increase in right ventricular wall  thickness.  3. Left atrial size was normal.  4. Right atrial size was normal.  5. The pericardial effusion is anterior to the right ventricle.  6. Trivial pericardial effusion is present.  7. The mitral valve is normal in structure. No evidence of mitral valve  regurgitation. No evidence of mitral stenosis.  8. The tricuspid valve is normal in structure. Tricuspid valve  regurgitation is not demonstrated.  9. The aortic valve was not well visualized. Aortic valve regurgitation  is trivial. No evidence of aortic valve sclerosis or stenosis.  10. The pulmonic valve was normal in structure. Pulmonic valve  regurgitation is trivial.  11. Aortic dilatation noted.  12. There is moderate dilatation of the ascending aorta measuring 45 mm.  13. The inferior vena cava is normal in size with greater than 50%  respiratory variability, suggesting right atrial pressure of 3 mmHg.   Aorta: Aortic dilatation noted. There is moderate dilatation of the  ascending aorta measuring 45 mm.   LLE Doppler ultrasound 03/2019: FINDINGS: Contralateral Common Femoral Vein: Respiratory phasicity is normal and symmetric with the symptomatic side. No evidence of thrombus. Normal compressibility.  Common Femoral  Vein: No evidence of thrombus. Normal compressibility, respiratory phasicity and response to augmentation.  Saphenofemoral Junction: No evidence of thrombus. Normal compressibility and flow on color Doppler imaging.  Profunda Femoral Vein: No evidence of thrombus. Normal compressibility and flow on color Doppler imaging.  Femoral Vein: No evidence of thrombus. Normal compressibility, respiratory phasicity and response to augmentation.  Popliteal Vein: No evidence of thrombus. Normal compressibility, respiratory phasicity and response to augmentation.  Calf Veins: No evidence of thrombus. Normal compressibility and flow on color Doppler imaging.  Superficial Great Saphenous Vein: No evidence of thrombus. Normal compressibility.  Venous Reflux:  None.  Other Findings: No evidence of superficial thrombophlebitis or abnormal fluid collection.  IMPRESSION: No evidence of left lower extremity deep venous thrombosis.  Nuclear stress 2017: Nuclear Measurements Study was gated.  Rest  Perfusion Rest perfusion normal.   There is a defect present in the basal inferior location.  Stress Perfusion Stress perfusion normal.   There is a defect present in the basal inferior location.  Perfusion Summary Defect 1:  There is a small defect of mild severity present in the basal inferior location. The defect is non-reversible.  Overall Study Impression Myocardial perfusion is abnormal.   This is a low risk study.  Overall left ventricular systolic function was normal.    LV cavity size is normal.  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  Additional short segment of dissection at a mildly dilated  RIGHT common iliac artery.  Aneurysmal dilatation ascending thoracic aorta 4.7 cm diameter at aortic root, recommendation below. Recommend semi-annual imaging followup by CTA or MRA and referral to cardiothoracic surgery if not already obtained. This recommendation follows 2010 ACCF/AHA/AATS/ACR/ASA/SCA/SCAI/SIR/STS/SVM Guidelines for the Diagnosis and Management of Patients With Thoracic Aortic Disease. Circulation. 2010; 121: e266-e36  Prostatic enlargement.  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  Abdominal ultrasound 2020: FINDINGS: Gallbladder: No gallstones or wall thickening visualized. No sonographic Murphy sign noted by sonographer.  Common bile duct: Diameter: 7.7 mm, upper limits of normal for patient's age.  Liver: No focal lesion identified. Within normal limits in parenchymal echogenicity. Portal vein is patent on color Doppler imaging with normal direction of blood flow towards the liver.  IVC: No abnormality visualized.  Pancreas: Poorly visualized due to extensive bowel gas.  Spleen: Size and appearance within normal limits.  Right Kidney: Length: 12.6 cm. 7 mm echogenic shadowing calcification in the lower pole right kidney. No hydronephrosis or visible concerning renal mass.  Left Kidney: Length: 11.5 cm. 6 mm echogenic, shadowing calculus in the lower pole left kidney. No hydronephrosis or visible concerning renal mass.  Abdominal aorta: Difficult visualization due to bowel gas. Proximal to mid aorta is not well visualized. Distally measures 1.8 cm.  EKG:  EKG is *** ordered today.  The ekg ordered today demonstrates ***  Recent Labs: 05/12/2019: ALT 37; BUN 10; Creatinine, Ser 1.05; Hemoglobin 12.4; Magnesium 1.1; Platelets 171; Potassium 3.6; Sodium 134  Recent Lipid Panel    Component Value Date/Time   CHOL 173 05/27/2012 0952   TRIG 87.0 05/27/2012 0952   HDL 41.80 05/27/2012 0952   CHOLHDL 4 05/27/2012 0952   VLDL  17.4 05/27/2012 0952   LDLCALC 114 (H) 05/27/2012 0952     Risk Assessment/Calculations:   {Does this patient have ATRIAL FIBRILLATION?[phone]}   Physical Exam:    VS:  There were no vitals taken for this visit.    Wt Readings from Last 3 Encounters:  05/13/19 209 lb 3.5 oz (94.9 kg)  03/09/17 203 lb 9.6 oz (92.4 kg)  02/22/17 208 lb (94.3 kg)     GEN: *** Well nourished, well developed in no acute distress HEENT: Normal NECK: No JVD; No carotid bruits LYMPHATICS: No lymphadenopathy CARDIAC: ***RRR, no murmurs, rubs, gallops RESPIRATORY:  Clear to auscultation without rales, wheezing or rhonchi  ABDOMEN: Soft, non-tender, non-distended MUSCULOSKELETAL:  No edema; No deformity  SKIN: Warm and dry NEUROLOGIC:  Alert and oriented x 3 PSYCHIATRIC:  Normal affect   ASSESSMENT:    No diagnosis found. PLAN:  In order of problems listed above:  #Ascending aortic aneurysm Measured 4.7cm on CTA in 2017 and 4.6cm on TTE in 2020.  -Continue BP control -Will perform CTA chest/abdomen/pelvis given history of descending aortic dissection as detailed below -ASA 81mg  -Statin   #Descending aortic dissection  Medically managed. Per CTA in 2017: Segmental dissection of the mid descending thoracic aorta overapproximately 5.2 cm length with a large 1.7 cm diameter fenestration between the 2 lumens, arising in an atypical position, question of this dissection being due to a penetrating ulcer is raised. -Repeat CTA chest/abdomen/pelvis for monitoring of both descending and ascending aneurysms -ASA 81mg  daily -Statin -Needs aggressive BP control  #Hypertension  #HLD:  #DMII:   Medication Adjustments/Labs and Tests Ordered: Current medicines are reviewed at length with the patient today.  Concerns regarding medicines are outlined above.  No orders of the defined types were placed in this encounter.  No orders of the defined types were placed in this encounter.   There  are no Patient Instructions on file for this visit.   Signed, Freada Bergeron, MD  03/24/2020 2:54 PM    [place]

## 2020-03-26 ENCOUNTER — Ambulatory Visit: Payer: Medicare HMO | Admitting: Cardiology

## 2020-04-01 ENCOUNTER — Encounter: Payer: Self-pay | Admitting: General Practice

## 2020-11-28 ENCOUNTER — Inpatient Hospital Stay (HOSPITAL_COMMUNITY)
Admission: EM | Admit: 2020-11-28 | Discharge: 2020-12-05 | DRG: 640 | Disposition: A | Discharge status: Home Health | Payer: Medicare HMO | Attending: Family Medicine | Admitting: Family Medicine

## 2020-11-28 ENCOUNTER — Inpatient Hospital Stay (HOSPITAL_COMMUNITY): Payer: Medicare HMO

## 2020-11-28 DIAGNOSIS — E876 Hypokalemia: Secondary | ICD-10-CM | POA: Diagnosis not present

## 2020-11-28 DIAGNOSIS — G8929 Other chronic pain: Secondary | ICD-10-CM | POA: Diagnosis present

## 2020-11-28 DIAGNOSIS — N1831 Chronic kidney disease, stage 3a: Secondary | ICD-10-CM | POA: Diagnosis present

## 2020-11-28 DIAGNOSIS — Z79899 Other long term (current) drug therapy: Secondary | ICD-10-CM

## 2020-11-28 DIAGNOSIS — E785 Hyperlipidemia, unspecified: Secondary | ICD-10-CM | POA: Diagnosis present

## 2020-11-28 DIAGNOSIS — Z880 Allergy status to penicillin: Secondary | ICD-10-CM

## 2020-11-28 DIAGNOSIS — Z20822 Contact with and (suspected) exposure to covid-19: Secondary | ICD-10-CM | POA: Diagnosis present

## 2020-11-28 DIAGNOSIS — R410 Disorientation, unspecified: Secondary | ICD-10-CM | POA: Diagnosis not present

## 2020-11-28 DIAGNOSIS — Z87891 Personal history of nicotine dependence: Secondary | ICD-10-CM

## 2020-11-28 DIAGNOSIS — K219 Gastro-esophageal reflux disease without esophagitis: Secondary | ICD-10-CM | POA: Diagnosis present

## 2020-11-28 DIAGNOSIS — Z833 Family history of diabetes mellitus: Secondary | ICD-10-CM | POA: Diagnosis not present

## 2020-11-28 DIAGNOSIS — G9341 Metabolic encephalopathy: Secondary | ICD-10-CM | POA: Diagnosis present

## 2020-11-28 DIAGNOSIS — E1142 Type 2 diabetes mellitus with diabetic polyneuropathy: Secondary | ICD-10-CM | POA: Diagnosis present

## 2020-11-28 DIAGNOSIS — N401 Enlarged prostate with lower urinary tract symptoms: Secondary | ICD-10-CM | POA: Diagnosis present

## 2020-11-28 DIAGNOSIS — N390 Urinary tract infection, site not specified: Secondary | ICD-10-CM | POA: Diagnosis present

## 2020-11-28 DIAGNOSIS — R454 Irritability and anger: Secondary | ICD-10-CM | POA: Diagnosis present

## 2020-11-28 DIAGNOSIS — I130 Hypertensive heart and chronic kidney disease with heart failure and stage 1 through stage 4 chronic kidney disease, or unspecified chronic kidney disease: Secondary | ICD-10-CM | POA: Diagnosis present

## 2020-11-28 DIAGNOSIS — M1990 Unspecified osteoarthritis, unspecified site: Secondary | ICD-10-CM | POA: Diagnosis present

## 2020-11-28 DIAGNOSIS — I5031 Acute diastolic (congestive) heart failure: Secondary | ICD-10-CM | POA: Diagnosis not present

## 2020-11-28 DIAGNOSIS — R42 Dizziness and giddiness: Secondary | ICD-10-CM | POA: Diagnosis present

## 2020-11-28 DIAGNOSIS — I5032 Chronic diastolic (congestive) heart failure: Secondary | ICD-10-CM | POA: Diagnosis present

## 2020-11-28 DIAGNOSIS — E871 Hypo-osmolality and hyponatremia: Principal | ICD-10-CM | POA: Diagnosis present

## 2020-11-28 DIAGNOSIS — R531 Weakness: Secondary | ICD-10-CM

## 2020-11-28 DIAGNOSIS — M545 Low back pain, unspecified: Secondary | ICD-10-CM | POA: Diagnosis present

## 2020-11-28 DIAGNOSIS — R338 Other retention of urine: Secondary | ICD-10-CM | POA: Diagnosis present

## 2020-11-28 DIAGNOSIS — Z7984 Long term (current) use of oral hypoglycemic drugs: Secondary | ICD-10-CM

## 2020-11-28 DIAGNOSIS — E1122 Type 2 diabetes mellitus with diabetic chronic kidney disease: Secondary | ICD-10-CM | POA: Diagnosis present

## 2020-11-28 LAB — CBC
HCT: 38.5 % — ABNORMAL LOW (ref 39.0–52.0)
Hemoglobin: 13.3 g/dL (ref 13.0–17.0)
MCH: 30.9 pg (ref 26.0–34.0)
MCHC: 34.5 g/dL (ref 30.0–36.0)
MCV: 89.3 fL (ref 80.0–100.0)
Platelets: 264 10*3/uL (ref 150–400)
RBC: 4.31 MIL/uL (ref 4.22–5.81)
RDW: 12.5 % (ref 11.5–15.5)
WBC: 5.8 10*3/uL (ref 4.0–10.5)
nRBC: 0 % (ref 0.0–0.2)

## 2020-11-28 LAB — URINALYSIS, ROUTINE W REFLEX MICROSCOPIC
Bilirubin Urine: NEGATIVE
Glucose, UA: NEGATIVE mg/dL
Ketones, ur: NEGATIVE mg/dL
Nitrite: NEGATIVE
Protein, ur: NEGATIVE mg/dL
Specific Gravity, Urine: 1.004 — ABNORMAL LOW (ref 1.005–1.030)
pH: 8 (ref 5.0–8.0)

## 2020-11-28 LAB — BASIC METABOLIC PANEL
Anion gap: 10 (ref 5–15)
BUN: 5 mg/dL — ABNORMAL LOW (ref 8–23)
CO2: 26 mmol/L (ref 22–32)
Calcium: 8.9 mg/dL (ref 8.9–10.3)
Chloride: 81 mmol/L — ABNORMAL LOW (ref 98–111)
Creatinine, Ser: 0.97 mg/dL (ref 0.61–1.24)
GFR, Estimated: >60 mL/min (ref >60)
Glucose, Bld: 117 mg/dL — ABNORMAL HIGH (ref 70–99)
Potassium: 3.6 mmol/L (ref 3.5–5.1)
Sodium: 117 mmol/L — CL (ref 135–145)

## 2020-11-28 LAB — SODIUM: Sodium: 119 mmol/L — CL (ref 135–145)

## 2020-11-28 LAB — CREATININE, SERUM
Creatinine, Ser: 1.09 mg/dL (ref 0.61–1.24)
GFR, Estimated: >60 mL/min (ref >60)

## 2020-11-28 MED ORDER — PANTOPRAZOLE SODIUM 40 MG PO TBEC
40.0000 mg | DELAYED_RELEASE_TABLET | Freq: Every day | ORAL | Status: DC
  Administered 2020-11-29 – 2020-12-05 (×7): 40 mg via ORAL
  Filled 2020-11-28 (×7): qty 1

## 2020-11-28 MED ORDER — MELATONIN 3 MG PO TABS: 3.0000 mg | ORAL_TABLET | Freq: Every evening | ORAL | Status: DC | PRN

## 2020-11-28 MED ORDER — ENOXAPARIN SODIUM 40 MG/0.4ML IJ SOSY
40.0000 mg | PREFILLED_SYRINGE | INTRAMUSCULAR | Status: DC
  Administered 2020-11-28 – 2020-12-04 (×7): 40 mg via SUBCUTANEOUS
  Filled 2020-11-28 (×7): qty 0.4

## 2020-11-28 MED ORDER — PRAVASTATIN SODIUM 10 MG PO TABS
10.0000 mg | ORAL_TABLET | Freq: Every evening | ORAL | Status: DC
  Administered 2020-11-29 – 2020-12-04 (×6): 10 mg via ORAL
  Filled 2020-11-28 (×6): qty 1

## 2020-11-28 MED ORDER — AMLODIPINE BESYLATE 10 MG PO TABS
10.0000 mg | ORAL_TABLET | Freq: Every day | ORAL | Status: DC
  Administered 2020-11-29 – 2020-12-05 (×7): 10 mg via ORAL
  Filled 2020-11-28 (×7): qty 1

## 2020-11-28 MED ORDER — HYDRALAZINE HCL 20 MG/ML IJ SOLN: 5.0000 mg | Freq: Four times a day (QID) | INTRAMUSCULAR | Status: DC | PRN

## 2020-11-28 MED ORDER — METOPROLOL SUCCINATE ER 25 MG PO TB24
25.0000 mg | ORAL_TABLET | Freq: Every day | ORAL | Status: DC
  Administered 2020-11-29 – 2020-12-05 (×7): 25 mg via ORAL
  Filled 2020-11-28 (×7): qty 1

## 2020-11-28 MED ORDER — AMLODIPINE BESYLATE 5 MG PO TABS: 10.0000 mg | ORAL_TABLET | Freq: Every day | ORAL | Status: DC

## 2020-11-28 MED ORDER — ONDANSETRON HCL 4 MG/2ML IJ SOLN: 4.0000 mg | Freq: Four times a day (QID) | INTRAMUSCULAR | Status: DC | PRN

## 2020-11-28 MED ORDER — CIPROFLOXACIN HCL 500 MG PO TABS: 500.0000 mg | ORAL_TABLET | Freq: Two times a day (BID) | ORAL | Status: DC

## 2020-11-28 MED ORDER — SODIUM CHLORIDE 0.9 % IV BOLUS
1000.0000 mL | Freq: Once | INTRAVENOUS | Status: AC
  Administered 2020-11-28: 1000 mL via INTRAVENOUS

## 2020-11-28 MED ORDER — ACETAMINOPHEN 325 MG PO TABS
650.0000 mg | ORAL_TABLET | Freq: Four times a day (QID) | ORAL | Status: DC | PRN
  Administered 2020-12-04: 650 mg via ORAL
  Filled 2020-11-28: qty 2

## 2020-11-28 MED ORDER — INSULIN ASPART 100 UNIT/ML IJ SOLN: 0.0000 [IU] | Freq: Every day | INTRAMUSCULAR | Status: DC

## 2020-11-28 MED ORDER — GABAPENTIN 300 MG PO CAPS
600.0000 mg | ORAL_CAPSULE | Freq: Every day | ORAL | Status: DC
  Administered 2020-11-28 – 2020-12-04 (×7): 600 mg via ORAL
  Filled 2020-11-28 (×7): qty 2

## 2020-11-28 MED ORDER — FUROSEMIDE 40 MG PO TABS
40.0000 mg | ORAL_TABLET | Freq: Every day | ORAL | Status: DC
  Administered 2020-11-29 – 2020-12-01 (×3): 40 mg via ORAL
  Filled 2020-11-28 (×3): qty 1

## 2020-11-28 MED ORDER — METOPROLOL SUCCINATE ER 25 MG PO TB24: 25.0000 mg | ORAL_TABLET | Freq: Every day | ORAL | Status: DC

## 2020-11-28 MED ORDER — INSULIN ASPART 100 UNIT/ML IJ SOLN
0.0000 [IU] | Freq: Three times a day (TID) | INTRAMUSCULAR | Status: DC
  Administered 2020-11-29: 1 [IU] via SUBCUTANEOUS
  Administered 2020-11-30: 3 [IU] via SUBCUTANEOUS
  Administered 2020-11-30 (×2): 2 [IU] via SUBCUTANEOUS
  Administered 2020-12-01: 1 [IU] via SUBCUTANEOUS
  Administered 2020-12-01: 3 [IU] via SUBCUTANEOUS
  Administered 2020-12-02 (×3): 1 [IU] via SUBCUTANEOUS
  Administered 2020-12-03 (×2): 2 [IU] via SUBCUTANEOUS
  Administered 2020-12-03: 1 [IU] via SUBCUTANEOUS
  Administered 2020-12-04: 3 [IU] via SUBCUTANEOUS
  Administered 2020-12-04: 1 [IU] via SUBCUTANEOUS
  Administered 2020-12-04: 2 [IU] via SUBCUTANEOUS
  Administered 2020-12-05: 1 [IU] via SUBCUTANEOUS

## 2020-11-28 MED ORDER — SODIUM CHLORIDE 0.9 % IV SOLN
2.0000 g | INTRAVENOUS | Status: AC
  Administered 2020-11-28 – 2020-12-02 (×5): 2 g via INTRAVENOUS
  Filled 2020-11-28 (×2): qty 2
  Filled 2020-11-28 (×2): qty 20
  Filled 2020-11-28: qty 2

## 2020-11-28 MED ORDER — POLYETHYLENE GLYCOL 3350 17 G PO PACK: 17.0000 g | PACK | Freq: Every day | ORAL | Status: DC | PRN

## 2020-11-28 NOTE — ED Provider Notes (Signed)
[facility] EMERGENCY DEPARTMENT Provider Note   CSN: [id number] Arrival date & time: 11/28/20  1729     History Chief Complaint  Patient presents with   Weakness    Donald Calderon is a 80 y.o. male.  Patient c/o general weakness, and periods of confusion in past weeks, gradual onset, moderate, persistent, waxing/waning, worse in past couple days. Denies focal or unilateral numbness or weakness. No change in speech or vision. No headache. +non prod cough. No sore throat or runny nose. No fever or chills. No known covid or flu exposure. Reports chronic bilateral leg swelling, no abrupt change, given diuretic by pcp for same. Denies chest pain. No abd pain or nvd. No dysuria. Family member notes pt drinks 'alot of water'.   The history is provided by the patient and a relative.  Weakness Associated symptoms: cough   Associated symptoms: no abdominal pain, no chest pain, no diarrhea, no fever, no headaches, no shortness of breath and no vomiting       Past Medical History:  Diagnosis Date   ALCOHOL ABUSE    Anxiety    Arthritis    Depression    Diabetes mellitus    GERD (gastroesophageal reflux disease)    Hypertension    Pneumonia     Patient Active Problem List   Diagnosis Date Noted   Sepsis due to undetermined organism ([place]) 05/11/2019   Hyperkalemia 05/11/2019   Hyperbilirubinemia 05/11/2019   Pressure injury of skin 03/10/2017   E coli UTI (urinary tract infection) 46/80/3212   Acute metabolic encephalopathy 24/82/5003   Encephalopathy 03/07/2017   Normocytic anemia 02/23/2017   Neurogenic bladder 02/23/2017   AKI (acute kidney injury) ([place]) 02/23/2017   Sepsis secondary to UTI ([place]) 02/22/2017   Postoperative ileus ([place]) 02/12/2017   Urine retention 01/29/2017   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   Lumbar stenosis 01/26/2017   Diabetes mellitus type 2 in obese ([place])    Hypertension associated with diabetes ([place])    ETOH abuse    Chronic  pain syndrome    Hyponatremia    Leukocytosis    Acute blood loss anemia    Epidural hematoma ([place]) 01/23/2017   Status post lumbar surgery 01/21/2017   Status post lumbar spine operation 01/21/2017   Chronic back pain 09/27/2011   NEUROPATHY 08/08/2010   SWELLING MASS OR LUMP IN HEAD AND NECK 06/27/2010   BENIGN PROSTATIC HYPERTROPHY, WITH URINARY OBSTRUCTION 06/19/2010   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   HYPOKALEMIA 03/18/2010   LIPOMA 02/14/2010   Diabetes ([place]) 02/07/2010   HYPERLIPIDEMIA 02/07/2010   ALCOHOL ABUSE 02/07/2010   HYPERTENSION 02/07/2010   SPINAL STENOSIS, LUMBAR 02/07/2010    Past Surgical History:  Procedure Laterality Date   BACK SURGERY      disc fro picched nerve times 2   EYE SURGERY Right    Gaucoma   LUMBAR LAMINECTOMY/DECOMPRESSION MICRODISCECTOMY N/A 01/21/2017   Procedure: Decompression Lumbar 2-3;  Surgeon: Melina Schools, MD;  Location: [place];  Service: Orthopedics;  Laterality: N/A;  210 mins   LUMBAR LAMINECTOMY/DECOMPRESSION MICRODISCECTOMY N/A 01/23/2017   Procedure: EXPLORATION AND EVACUATION OF HEMATOMA L2-L3;  Surgeon: Melina Schools, MD;  Location: [place];  Service: Orthopedics;  Laterality: N/A;   PROSTATE SURGERY         Family History  Problem Relation Age of Onset   Diabetes Mother    Diabetes Father     Social History   Tobacco Use   Smoking status: Former  Packs/day: 1.00    Years: 39.00    Pack years: 39.00    Types: Cigarettes    Quit date: 2000    Years since quitting: 22.4   Smokeless tobacco: Former    Types: Chew    Quit date: 09/26/1997  Vaping Use   Vaping Use: Never used  Substance Use Topics   Alcohol use: Yes    Alcohol/week: 4.0 standard drinks    Types: 4 Cans of beer per week    Comment: 4 cans of beer per day   Drug use: No    Home Medications Prior to Admission medications   Medication Sig Start Date End Date Taking? Authorizing Provider  acetaminophen (TYLENOL) 325  MG tablet Take 1-2 tablets (325-650 mg total) by mouth every 4 (four) hours as needed for mild pain. 02/11/17   Love, Ivan Anchors, PA-C  albuterol (PROVENTIL) (2.5 MG/3ML) 0.083% nebulizer solution Take 3 mLs (2.5 mg total) by nebulization every 2 (two) hours as needed for wheezing. 03/11/17   Theodis Blaze, MD  amLODipine (NORVASC) 10 MG tablet Take 1 tablet (10 mg total) by mouth daily. 02/12/17   Love, Ivan Anchors, PA-C  bethanechol (URECHOLINE) 25 MG tablet Take 1 tablet (25 mg total) by mouth 3 (three) times daily. 02/12/17   Love, Ivan Anchors, PA-C  Camphor-Menthol-Methyl Sal (SALONPAS EX) Apply 1 patch topically daily as needed (pain/aches).    [provider]  cyclobenzaprine (FLEXERIL) 5 MG tablet Take 1 tablet (5 mg total) by mouth 3 (three) times daily as needed for muscle spasms. 02/12/17   Love, Ivan Anchors, PA-C  diclofenac sodium (VOLTAREN) 1 % GEL Apply 2 g topically 4 (four) times daily. 03/01/17   Arrien, Jimmy Picket, MD  gabapentin (NEURONTIN) 100 MG capsule Take 1 capsule (100 mg total) by mouth 3 (three) times daily. 02/12/17   Love, Ivan Anchors, PA-C  gabapentin (NEURONTIN) 300 MG capsule Take 600 mg by mouth at bedtime. 04/21/19   [provider]  hydrocortisone (ANUSOL-HC) 2.5 % rectal cream Place rectally 4 (four) times daily. 02/12/17   Love, Ivan Anchors, PA-C  lisinopril-hydrochlorothiazide (ZESTORETIC) 20-25 MG tablet Take 1 tablet by mouth daily. 04/06/19   [provider]  metFORMIN (GLUCOPHAGE) 1000 MG tablet Take 2,000 mg by mouth at bedtime. 03/30/19   [provider]  metoprolol succinate (TOPROL-XL) 25 MG 24 hr tablet Take 1 tablet (25 mg total) by mouth daily. 08/23/18   Adrian Prows, MD  pantoprazole (PROTONIX) 40 MG tablet Take 1 tablet (40 mg total) by mouth daily. 03/01/17 03/31/17  Arrien, Jimmy Picket, MD  polyethylene glycol [facility] / Floria Raveling) packet Take 17 g by mouth daily as needed. 03/11/17   Theodis Blaze, MD  pravastatin (PRAVACHOL) 10 MG  tablet Take 1 tablet (10 mg total) by mouth every evening. 02/12/17   Love, Ivan Anchors, PA-C  tamsulosin (FLOMAX) 0.4 MG CAPS capsule Take 1 capsule (0.4 mg total) by mouth 2 (two) times daily. 02/12/17   LoveIvan Anchors, PA-C    Allergies    Penicillins  Review of Systems   Review of Systems  Constitutional:  Negative for fever.  HENT:  Negative for sore throat.   Eyes:  Negative for redness.  Respiratory:  Positive for cough. Negative for shortness of breath.   Cardiovascular:  Negative for chest pain.  Gastrointestinal:  Negative for abdominal pain, diarrhea and vomiting.  Endocrine: Negative for polyuria.  Genitourinary:  Negative for flank pain.  Musculoskeletal:  Negative for back  pain and neck pain.  Skin:  Negative for rash.  Neurological:  Positive for weakness. Negative for headaches.  Hematological:  Does not bruise/bleed easily.  Psychiatric/Behavioral:  Positive for confusion.    Physical Exam Updated Vital Signs BP (!) 169/91 (BP Location: Right Arm)   Pulse 73   Temp 98.9 F (37.2 C) (Oral)   Resp 18   SpO2 99%   Physical Exam Vitals and nursing note reviewed.  Constitutional:      Appearance: Normal appearance. He is well-developed.  HENT:     Head: Atraumatic.     Nose: Nose normal.     Mouth/Throat:     Mouth: Mucous membranes are moist.     Pharynx: Oropharynx is clear.  Eyes:     General: No scleral icterus.    Conjunctiva/sclera: Conjunctivae normal.     Pupils: Pupils are equal, round, and reactive to light.  Neck:     Trachea: No tracheal deviation.  Cardiovascular:     Rate and Rhythm: Normal rate and regular rhythm.     Pulses: Normal pulses.     Heart sounds: Normal heart sounds. No murmur heard.   No friction rub. No gallop.  Pulmonary:     Effort: Pulmonary effort is normal. No accessory muscle usage or respiratory distress.     Breath sounds: Normal breath sounds.  Abdominal:     General: Bowel sounds are normal. There is no distension.      Palpations: Abdomen is soft.     Tenderness: There is no abdominal tenderness. There is no guarding.  Genitourinary:    Comments: No cva tenderness. Musculoskeletal:     Cervical back: Normal range of motion and neck supple. No rigidity.     Comments: Moderate, symmetric, bilateral leg edema.   Skin:    General: Skin is warm and dry.     Findings: No rash.  Neurological:     Mental Status: He is alert.     Comments: Alert, speech clear. No gross dysarthria or aphasia. Motor/sens grossly intact bil.   Psychiatric:        Mood and Affect: Mood normal.    ED Results / Procedures / Treatments   Labs (all labs ordered are listed, but only abnormal results are displayed) Results for orders placed or performed during the hospital encounter of 93/79/02  Basic metabolic panel  Result Value Ref Range   Sodium 117 (LL) 135 - 145 mmol/L   Potassium 3.6 3.5 - 5.1 mmol/L   Chloride 81 (L) 98 - 111 mmol/L   CO2 26 22 - 32 mmol/L   Glucose, Bld 117 (H) 70 - 99 mg/dL   BUN 5 (L) 8 - 23 mg/dL   Creatinine, Ser 0.97 0.61 - 1.24 mg/dL   Calcium 8.9 8.9 - 10.3 mg/dL   GFR, Estimated >60 >60 mL/min   Anion gap 10 5 - 15  CBC  Result Value Ref Range   WBC 5.8 4.0 - 10.5 K/uL   RBC 4.31 4.22 - 5.81 MIL/uL   Hemoglobin 13.3 13.0 - 17.0 g/dL   HCT 38.5 (L) 39.0 - 52.0 %   MCV 89.3 80.0 - 100.0 fL   MCH 30.9 26.0 - 34.0 pg   MCHC 34.5 30.0 - 36.0 g/dL   RDW 12.5 11.5 - 15.5 %   Platelets 264 150 - 400 K/uL   nRBC 0.0 0.0 - 0.2 %  Urinalysis, Routine w reflex microscopic  Result Value Ref Range   Color, Urine STRAW (  A) YELLOW   APPearance CLEAR CLEAR   Specific Gravity, Urine 1.004 (L) 1.005 - 1.030   pH 8.0 5.0 - 8.0   Glucose, UA NEGATIVE NEGATIVE mg/dL   Hgb urine dipstick MODERATE (A) NEGATIVE   Bilirubin Urine NEGATIVE NEGATIVE   Ketones, ur NEGATIVE NEGATIVE mg/dL   Protein, ur NEGATIVE NEGATIVE mg/dL   Nitrite NEGATIVE NEGATIVE   Leukocytes,Ua MODERATE (A) NEGATIVE   RBC /  HPF 0-5 0 - 5 RBC/hpf   WBC, UA 11-20 0 - 5 WBC/hpf   Bacteria, UA RARE (A) NONE SEEN   Mucus PRESENT      EKG EKG Interpretation  Date/Time:  Thursday November 28 2020 17:43:17 EDT Ventricular Rate:  77 PR Interval:  192 QRS Duration: 94 QT Interval:  374 QTC Calculation: 423 R Axis:   -6 Text Interpretation: Normal sinus rhythm Normal ECG Confirmed by Lajean Saver [id number]) on 11/28/2020 8:47:24 PM  Radiology No results found.  Procedures Procedures   Medications Ordered in ED Medications  sodium chloride 0.9 % bolus 1,000 mL (has no administration in time range)    ED Course  I have reviewed the triage vital signs and the nursing notes.  Pertinent labs & imaging results that were available during my care of the patient were reviewed by me and considered in my medical decision making (see chart for details).  Clinical Course as of 11/29/20 1901  Thu Nov 28, 2020  2026 Sodium(!!): 117 Charge nurse, Janett Billow, notified and advised patient will need a room in the back. [SJ]    Clinical Course User Index [SJ] Joy, Shawn C, PA-C   MDM Rules/Calculators/A&P                         Iv ns. Stat labs.   Reviewed nursing notes and prior charts for additional history.   Labs reviewed/interpreted by me -Na quite low. Additional labs added. Iv ns bolus.  Given weakness, confusion, and low NA - medicine service consulted.   Additional labs reviewed/interpreted by me - ua w 11-20 wbc, no dysuria, afebrile. Will culture.   Unassigned medicine consulted for admission.   CRITICAL CARE Re hyponatremia w confusion/weakness/altered ms Performed by: Mirna Mires Total critical care time: 35 minutes Critical care time was exclusive of separately billable procedures and treating other patients. Critical care was necessary to treat or prevent imminent or life-threatening deterioration. Critical care was time spent personally by me on the following activities: development of treatment  plan with patient and/or surrogate as well as nursing, discussions with consultants, evaluation of patient's response to treatment, examination of patient, obtaining history from patient or surrogate, ordering and performing treatments and interventions, ordering and review of laboratory studies, ordering and review of radiographic studies, pulse oximetry and re-evaluation of patient's condition.    Final Clinical Impression(s) / ED Diagnoses Final diagnoses:  None    Rx / DC Orders ED Discharge Orders     None        Lajean Saver, MD 11/29/20 1905

## 2020-11-28 NOTE — Consult Note (Signed)
Reason for Consult:Hyponatremia Referring Physician: [name] is an 80 y.o. 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  He presents to the emergency department, brought in by family for weakness, mild confusion with irritability and mood swings.  He has edema that he says is chronic, on both HCTZ and Lasix without success.  He says that his appetite has been good, he has been eating, but also trying to drink a lot of fluids to follow doctor's instructions.  He tells me he has had a history of low sodium in the past, review of data does show some sodium was in the high 120s.  Today, he was found to have a sodium of 117 hence the reason for consult.  He has been given a liter of normal saline.  Plan is to admit.  Urinalysis shows pyuria.  Other labs to evaluate hyponatremia have not been resulted yet   Trend in Creatinine: Creatinine, Ser  Date/Time Value Ref Range Status  11/28/2020 05:51 PM 0.97 0.61 - 1.24 mg/dL Final  05/12/2019 02:19 AM 1.05 0.61 - 1.24 mg/dL Final  05/11/2019 08:06 PM 1.00 0.61 - 1.24 mg/dL Final  05/11/2019 04:53 PM 1.13 0.61 - 1.24 mg/dL Final  03/11/2017 06:49 AM 1.43 (H) 0.61 - 1.24 mg/dL Final  03/09/2017 08:04 AM 1.32 (H) 0.61 - 1.24 mg/dL Final  03/08/2017 07:53 AM 1.51 (H) 0.61 - 1.24 mg/dL Final  03/07/2017 04:02 PM 1.60 (H) 0.61 - 1.24 mg/dL Final  03/01/2017 03:24 AM 1.46 (H) 0.61 - 1.24 mg/dL Final  02/28/2017 05:31 AM 1.47 (H) 0.61 - 1.24 mg/dL Final  02/27/2017 04:57 AM 1.65 (H) 0.61 - 1.24 mg/dL Final  02/25/2017 04:10 AM 2.03 (H) 0.61 - 1.24 mg/dL Final  02/23/2017 06:58 AM 2.48 (H) 0.61 - 1.24 mg/dL Final  02/22/2017 05:51 PM 2.70 (H) 0.61 - 1.24 mg/dL Final  02/22/2017 04:52 PM 2.71 (H) 0.61 - 1.24 mg/dL Final  02/09/2017 05:14 AM 0.96 0.61 - 1.24 mg/dL Final  02/04/2017 06:33 AM 1.23 0.61 - 1.24 mg/dL Final  02/02/2017 05:08 AM 1.08 0.61 - 1.24 mg/dL Final   01/27/2017 05:28 AM 1.10 0.61 - 1.24 mg/dL Final  01/23/2017 01:15 PM 1.10 0.61 - 1.24 mg/dL Final  01/15/2017 09:52 AM 0.96 0.61 - 1.24 mg/dL Final  10/22/2016 04:10 PM 1.40 (H) 0.61 - 1.24 mg/dL Final  05/27/2012 09:52 AM 1.1 0.4 - 1.5 mg/dL Final  02/26/2012 11:24 AM 1.4 0.4 - 1.5 mg/dL Final  09/27/2011 03:55 PM 0.74 0.50 - 1.35 mg/dL Final  06/20/2010 04:20 AM 0.83 0.4 - 1.5 mg/dL Final  05/21/2010 03:58 PM 0.88 0.4 - 1.5 mg/dL Final  04/11/2010 12:14 PM 0.88 0.4 - 1.5 mg/dL Final  03/31/2010 09:31 PM 0.83 0.40 - 1.50 mg/dL Final  03/17/2010 09:55 PM 0.84 0.40 - 1.50 mg/dL Final  03/07/2010 09:38 PM 0.88 0.40 - 1.50 mg/dL Final  02/07/2010 09:38 PM 0.82 0.40 - 1.50 mg/dL Final  12/30/2009 10:15 AM 0.76 0.4 - 1.5 mg/dL Final   Sodium  Date/Time Value Ref Range Status  11/28/2020 05:51 PM 117 (LL) 135 - 145 mmol/L Final    Comment:    CRITICAL RESULT CALLED TO, READ BACK BY AND VERIFIED WITH: M.RUGGIERO,RN @2016  11/28/2020 [email or web address]   05/12/2019 02:19 AM 134 (L) 135 - 145 mmol/L Final  05/11/2019 08:06 PM 133 (L) 135 - 145 mmol/L Final  05/11/2019 04:53 PM 129 (L) 135 - 145 mmol/L Final  03/11/2017 06:49 AM  138 135 - 145 mmol/L Final  03/09/2017 08:04 AM 141 135 - 145 mmol/L Final  03/08/2017 07:53 AM 143 135 - 145 mmol/L Final  03/07/2017 04:02 PM 142 135 - 145 mmol/L Final  03/01/2017 03:24 AM 139 135 - 145 mmol/L Final  02/28/2017 05:31 AM 136 135 - 145 mmol/L Final  02/27/2017 04:57 AM 138 135 - 145 mmol/L Final  02/25/2017 04:10 AM 135 135 - 145 mmol/L Final  02/23/2017 06:58 AM 133 (L) 135 - 145 mmol/L Final  02/22/2017 05:51 PM 132 (L) 135 - 145 mmol/L Final  02/22/2017 04:52 PM 131 (L) 135 - 145 mmol/L Final  02/09/2017 05:14 AM 131 (L) 135 - 145 mmol/L Final  02/04/2017 06:33 AM 129 (L) 135 - 145 mmol/L Final  02/02/2017 05:08 AM 127 (L) 135 - 145 mmol/L Final  01/27/2017 05:28 AM 133 (L) 135 - 145 mmol/L Final  01/23/2017 01:15 PM 134 (L) 135 - 145 mmol/L Final   01/15/2017 09:52 AM 138 135 - 145 mmol/L Final  10/22/2016 04:10 PM 129 (L) 135 - 145 mmol/L Final  05/27/2012 09:52 AM 131 (L) 135 - 145 mEq/L Final  02/26/2012 11:24 AM 138 135 - 145 mEq/L Final  09/27/2011 03:55 PM 132 (L) 135 - 145 mEq/L Final  06/20/2010 04:20 AM 128 (L) 135 - 145 mEq/L Final  06/19/2010 01:34 PM 129 (L) 135 - 145 mEq/L Final  05/21/2010 03:58 PM 134 (L) 135 - 145 mEq/L Final  04/11/2010 12:14 PM 136 135 - 145 mEq/L Final  03/31/2010 09:31 PM 139 135 - 145 meq/L Final  03/17/2010 09:55 PM 138 135 - 145 meq/L Final  03/07/2010 09:38 PM 144 135 - 145 meq/L Final  02/07/2010 09:38 PM 138 135 - 145 meq/L Final  12/30/2009 10:15 AM 133 (L) 135 - 145 mEq/L Final   PMH:   Past Medical History:  Diagnosis Date   ALCOHOL ABUSE    Anxiety    Arthritis    Depression    Diabetes mellitus    GERD (gastroesophageal reflux disease)    Hypertension    Pneumonia     PSH:   Past Surgical History:  Procedure Laterality Date   BACK SURGERY      disc fro picched nerve times 2   EYE SURGERY Right    Gaucoma   LUMBAR LAMINECTOMY/DECOMPRESSION MICRODISCECTOMY N/A 01/21/2017   Procedure: Decompression Lumbar 2-3;  Surgeon: Melina Schools, MD;  Location: [place];  Service: Orthopedics;  Laterality: N/A;  210 mins   LUMBAR LAMINECTOMY/DECOMPRESSION MICRODISCECTOMY N/A 01/23/2017   Procedure: EXPLORATION AND EVACUATION OF HEMATOMA L2-L3;  Surgeon: Melina Schools, MD;  Location: [place];  Service: Orthopedics;  Laterality: N/A;   PROSTATE SURGERY      Allergies:  Allergies  Allergen Reactions   Penicillins Itching and Rash    Has patient had a PCN reaction causing immediate rash, facial/tongue/throat swelling, SOB or lightheadedness with hypotension: No Has patient had a PCN reaction causing severe rash involving mucus membranes or skin necrosis: No Has patient had a PCN reaction that required hospitalization: No Has patient had a PCN reaction occurring within the last 10 years:  No If all of the above answers are "NO", then may proceed with Cephalosporin use.  Tolerated Ancef     Medications:   Prior to Admission medications   Medication Sig Start Date End Date Taking? Authorizing Provider  cetirizine (ZYRTEC) 10 MG tablet Take 10 mg by mouth daily. 11/20/20  Yes [provider]  clobetasol cream (  TEMOVATE) 4.65 % Apply 1 application topically 2 (two) times daily. 11/20/20  Yes [provider]  fluticasone (FLONASE) 50 MCG/ACT nasal spray Place 1 spray into both nostrils daily as needed for rhinitis. 11/20/20  Yes [provider]  furosemide (LASIX) 20 MG tablet Take 20 mg by mouth 2 (two) times daily. 11/07/20  Yes [provider]  gabapentin (NEURONTIN) 300 MG capsule Take 300 mg by mouth See admin instructions. 300mg  twice daily and 600mg  at night. 04/21/19  Yes [provider]  lisinopril-hydrochlorothiazide (ZESTORETIC) 20-25 MG tablet Take 1 tablet by mouth daily. 04/06/19  Yes [provider]  metFORMIN (GLUCOPHAGE) 1000 MG tablet Take 2,000 mg by mouth 2 (two) times daily with a meal. 03/30/19  Yes [provider]  metoprolol succinate (TOPROL-XL) 25 MG 24 hr tablet Take 1 tablet (25 mg total) by mouth daily. 08/23/18  Yes Adrian Prows, MD  polyethylene glycol (MIRALAX / GLYCOLAX) packet Take 17 g by mouth daily as needed. Patient taking differently: Take 17 g by mouth daily as needed for mild constipation. 03/11/17  Yes Theodis Blaze, MD  potassium chloride (KLOR-CON) 10 MEQ tablet Take 10 mEq by mouth daily. 09/19/20  Yes [provider]  pravastatin (PRAVACHOL) 10 MG tablet Take 1 tablet (10 mg total) by mouth every evening. 02/12/17  Yes Love, Ivan Anchors, PA-C  tamsulosin (FLOMAX) 0.4 MG CAPS capsule Take 1 capsule (0.4 mg total) by mouth 2 (two) times daily. 02/12/17  Yes Love, Ivan Anchors, PA-C  acetaminophen (TYLENOL) 325 MG tablet Take 1-2 tablets (325-650 mg total) by mouth every 4 (four) hours as  needed for mild pain. Patient not taking: Reported on 11/28/2020 02/11/17   Love, Ivan Anchors, PA-C  albuterol (PROVENTIL) (2.5 MG/3ML) 0.083% nebulizer solution Take 3 mLs (2.5 mg total) by nebulization every 2 (two) hours as needed for wheezing. Patient not taking: Reported on 11/28/2020 03/11/17   Theodis Blaze, MD  amLODipine (NORVASC) 10 MG tablet Take 1 tablet (10 mg total) by mouth daily. Patient not taking: Reported on 11/28/2020 02/12/17   Love, Ivan Anchors, PA-C  bethanechol (URECHOLINE) 25 MG tablet Take 1 tablet (25 mg total) by mouth 3 (three) times daily. Patient not taking: Reported on 11/28/2020 02/12/17   Love, Ivan Anchors, PA-C  Camphor-Menthol-Methyl Sal [facility] EX) Apply 1 patch topically daily as needed (pain/aches).    [provider]  cyclobenzaprine (FLEXERIL) 5 MG tablet Take 1 tablet (5 mg total) by mouth 3 (three) times daily as needed for muscle spasms. Patient not taking: Reported on 11/28/2020 02/12/17   Love, Ivan Anchors, PA-C  diclofenac sodium (VOLTAREN) 1 % GEL Apply 2 g topically 4 (four) times daily. Patient not taking: Reported on 11/28/2020 03/01/17   Arrien, Jimmy Picket, MD  gabapentin (NEURONTIN) 100 MG capsule Take 1 capsule (100 mg total) by mouth 3 (three) times daily. Patient not taking: No sig reported 02/12/17   Love, Ivan Anchors, PA-C  hydrocortisone (ANUSOL-HC) 2.5 % rectal cream Place rectally 4 (four) times daily. Patient not taking: Reported on 11/28/2020 02/12/17   Love, Ivan Anchors, PA-C  pantoprazole (PROTONIX) 40 MG tablet Take 1 tablet (40 mg total) by mouth daily. 03/01/17 03/31/17  Arrien, Jimmy Picket, MD    Discontinued Meds:   Medications Discontinued During This Encounter  Medication Reason   ciprofloxacin (CIPRO) tablet 500 mg    amLODipine (NORVASC) tablet 10 mg    metoprolol succinate (TOPROL-XL) 24 hr tablet 25 mg     Social  History:  reports that he quit smoking about 22 years ago. His smoking use included cigarettes. He has a 39.00  pack-year smoking history. He quit smokeless tobacco use about 23 years ago.  His smokeless tobacco use included chew. He reports current alcohol use of about 4.0 standard drinks of alcohol per week. He reports that he does not use drugs.  Family History:   Family History  Problem Relation Age of Onset   Diabetes Mother    Diabetes Father     A comprehensive review of systems was negative except for: Constitutional: positive for fatigue Cardiovascular: positive for lower extremity edema Behavioral/Psych: positive for aggressive behavior and bad mood  Blood pressure (!) 169/91, pulse 73, temperature 98.9 F (37.2 C), temperature source Oral, resp. rate 18, SpO2 99 %. General appearance: alert, distracted, fatigued, and mild distress Eyes: conjunctivae/corneas clear. PERRL, EOM's intact. Fundi benign. Neck: no adenopathy, no carotid bruit, no JVD, supple, symmetrical, trachea midline, and thyroid not enlarged, symmetric, no tenderness/mass/nodules Resp: clear to auscultation bilaterally Cardio: regular rate and rhythm, S1, S2 normal, no murmur, click, rub or gallop GI: soft, non-tender; bowel sounds normal; no masses,  no organomegaly Extremities: edema 2+ Neurologic: Mental status: Alert, oriented, thought content appropriate, alertness: alert, affect: flat  Labs: Basic Metabolic Panel: Recent Labs  Lab 11/28/20 1751  NA 117*  K 3.6  CL 81*  CO2 26  GLUCOSE 117*  BUN 5*  CREATININE 0.97  CALCIUM 8.9   Liver Function Tests: No results for input(s): AST, ALT, ALKPHOS, BILITOT, PROT, ALBUMIN in the last 168 hours. No results for input(s): LIPASE, AMYLASE in the last 168 hours. No results for input(s): AMMONIA in the last 168 hours. CBC: Recent Labs  Lab 11/28/20 1751  WBC 5.8  HGB 13.3  HCT 38.5*  MCV 89.3  PLT 264   PT/INR: @labrcntip (inr:5) Cardiac Enzymes: No results for input(s): CKTOTAL, CKMB, CKMBINDEX, TROPONINI in the last 168 hours. CBG: No results for  input(s): GLUCAP in the last 168 hours.  Iron Studies: No results for input(s): IRON, TIBC, TRANSFERRIN, FERRITIN in the last 168 hours.  Xrays/Other Studies: DG Chest 2 View  Result Date: 11/28/2020 CLINICAL DATA:  Swelling, cough EXAM: CHEST - 2 VIEW COMPARISON:  05/11/2019 FINDINGS: Heart and mediastinal contours are within normal limits. No focal opacities or effusions. No acute bony abnormality. IMPRESSION: No active cardiopulmonary disease. Electronically Signed   By: Rolm Baptise M.D.   On: 11/28/2020 21:25     Assessment/Plan: 80 year old BM with DM, HTN presenting with hyponatremia 1) Hyponatremia-  seems like hypervolemic hyponatremia. Has been given NS.  Would not give any more fluid.  I agree with fluid restriction.  I think actually needs a loop diuretic.  Holding the HCTZ-  add back the lasix but obtain urine osm and sodium measurement first.  It is unclear if the sodium level is giving symptoms , no indication for 3 % saline -    will need to correct sodium and see if his behavior changes.  I do not note any hyponatremia causing agents on his OP meds list.  TSH and cortisol are pending-  serial sodiums have been ordered.  Will continue to follow with you      Louis Meckel 11/28/2020, 10:56 PM

## 2020-11-28 NOTE — ED Triage Notes (Signed)
Pt c/o weakness x1day, hx of same due to dehydration. States he feels he's unable to get up. Endorses dehydration this morning but started feeling better as the day went on. Denies vomiting, states that diarrhea is "off & on" for months, denies blood in stool. States daughter is w him for clarification if needed.

## 2020-11-28 NOTE — H&P (Addendum)
History and Physical  Donald Calderon QQI:[id number] DOB: 08/08/1940 DOA: 11/28/2020  Referring physician: Dr. Ashok Cordia, EDP  PCP: Medicine, [facility]  Outpatient Specialists: Urology, podiatry Patient coming from: Home.  Chief Complaint: Confusion, generalized weakness.  HPI: Donald Calderon is a 80 y.o. male with medical history significant for bilateral plantar fasciitis, generalized edema, essential hypertension, type 2 diabetes, diabetic polyneuropathy, BPH, who presented to [facility] ED from home due to mild confusion and generalized weakness for the past 1 month and worse for the past week.  Associated with intermittent dizziness, irritability, mood swings, non productive cough.  Has been drinking a lot of water.  He takes lasix, lisinopril and HCTZ as prescribed by his PCP.  Patient was brought to the ED for further evaluation.  Work-up in the ED revealed serum sodium of 117.  Patient's Lasix, ACE inhibitor and HCTZ have been held.  Received 1 L normal saline.  TRH, hospitalist team, was asked to admit.  ED Course:  Afebrile, temperature 98.9.  BP 169/91, pulse 73, respiratory 18, O2 saturation 99% on room air.  Lab studies remarkable for serum sodium 117, potassium 3.6, chloride 81, serum bicarb 26, serum glucose 117, BUN 5, creatinine 0.97, anion gap 10, GFR greater than 60.  CBC is essentially unremarkable.  UA positive for pyuria.  Chest x-ray is pending.  Review of Systems: Review of systems as noted in the HPI. All other systems reviewed and are negative.   Past Medical History:  Diagnosis Date   ALCOHOL ABUSE    Anxiety    Arthritis    Depression    Diabetes mellitus    GERD (gastroesophageal reflux disease)    Hypertension    Pneumonia    Past Surgical History:  Procedure Laterality Date   BACK SURGERY      disc fro picched nerve times 2   EYE SURGERY Right    Gaucoma   LUMBAR LAMINECTOMY/DECOMPRESSION MICRODISCECTOMY N/A 01/21/2017   Procedure: Decompression  Lumbar 2-3;  Surgeon: Melina Schools, MD;  Location: [place];  Service: Orthopedics;  Laterality: N/A;  210 mins   LUMBAR LAMINECTOMY/DECOMPRESSION MICRODISCECTOMY N/A 01/23/2017   Procedure: EXPLORATION AND EVACUATION OF HEMATOMA L2-L3;  Surgeon: Melina Schools, MD;  Location: [place];  Service: Orthopedics;  Laterality: N/A;   PROSTATE SURGERY      Social History:  reports that he quit smoking about 22 years ago. His smoking use included cigarettes. He has a 39.00 pack-year smoking history. He quit smokeless tobacco use about 23 years ago.  His smokeless tobacco use included chew. He reports current alcohol use of about 4.0 standard drinks of alcohol per week. He reports that he does not use drugs.   Allergies  Allergen Reactions   Penicillins Itching and Rash    Has patient had a PCN reaction causing immediate rash, facial/tongue/throat swelling, SOB or lightheadedness with hypotension: No Has patient had a PCN reaction causing severe rash involving mucus membranes or skin necrosis: No Has patient had a PCN reaction that required hospitalization: No Has patient had a PCN reaction occurring within the last 10 years: No If all of the above answers are "NO", then may proceed with Cephalosporin use.  Tolerated Ancef     Family History  Problem Relation Age of Onset   Diabetes Mother    Diabetes Father       Prior to Admission medications   Medication Sig Start Date End Date Taking? Authorizing Provider  acetaminophen (TYLENOL) 325 MG tablet Take  1-2 tablets (325-650 mg total) by mouth every 4 (four) hours as needed for mild pain. 02/11/17   Love, Ivan Anchors, PA-C  albuterol (PROVENTIL) (2.5 MG/3ML) 0.083% nebulizer solution Take 3 mLs (2.5 mg total) by nebulization every 2 (two) hours as needed for wheezing. 03/11/17   Theodis Blaze, MD  amLODipine (NORVASC) 10 MG tablet Take 1 tablet (10 mg total) by mouth daily. 02/12/17   Love, Ivan Anchors, PA-C  bethanechol (URECHOLINE) 25 MG tablet Take 1  tablet (25 mg total) by mouth 3 (three) times daily. 02/12/17   Love, Ivan Anchors, PA-C  Camphor-Menthol-Methyl Sal (SALONPAS EX) Apply 1 patch topically daily as needed (pain/aches).    [provider]  cyclobenzaprine (FLEXERIL) 5 MG tablet Take 1 tablet (5 mg total) by mouth 3 (three) times daily as needed for muscle spasms. 02/12/17   Love, Ivan Anchors, PA-C  diclofenac sodium (VOLTAREN) 1 % GEL Apply 2 g topically 4 (four) times daily. 03/01/17   Arrien, Jimmy Picket, MD  gabapentin (NEURONTIN) 100 MG capsule Take 1 capsule (100 mg total) by mouth 3 (three) times daily. 02/12/17   Love, Ivan Anchors, PA-C  gabapentin (NEURONTIN) 300 MG capsule Take 600 mg by mouth at bedtime. 04/21/19   [provider]  hydrocortisone (ANUSOL-HC) 2.5 % rectal cream Place rectally 4 (four) times daily. 02/12/17   Love, Ivan Anchors, PA-C  lisinopril-hydrochlorothiazide (ZESTORETIC) 20-25 MG tablet Take 1 tablet by mouth daily. 04/06/19   [provider]  metFORMIN (GLUCOPHAGE) 1000 MG tablet Take 2,000 mg by mouth at bedtime. 03/30/19   [provider]  metoprolol succinate (TOPROL-XL) 25 MG 24 hr tablet Take 1 tablet (25 mg total) by mouth daily. 08/23/18   Adrian Prows, MD  pantoprazole (PROTONIX) 40 MG tablet Take 1 tablet (40 mg total) by mouth daily. 03/01/17 03/31/17  Arrien, Jimmy Picket, MD  polyethylene glycol [facility]) packet Take 17 g by mouth daily as needed. 03/11/17   Theodis Blaze, MD  pravastatin (PRAVACHOL) 10 MG tablet Take 1 tablet (10 mg total) by mouth every evening. 02/12/17   Love, Ivan Anchors, PA-C  tamsulosin (FLOMAX) 0.4 MG CAPS capsule Take 1 capsule (0.4 mg total) by mouth 2 (two) times daily. 02/12/17   Bary Leriche, PA-C    Physical Exam: BP (!) 169/91 (BP Location: Right Arm)   Pulse 73   Temp 98.9 F (37.2 C) (Oral)   Resp 18   SpO2 99%   General: 80 y.o. year-old male well developed well nourished in no acute distress.  Alert and oriented x3.   Slow to respond orientation questions. Cardiovascular: Regular rate and rhythm with no rubs or gallops.  No thyromegaly or JVD noted.  2+ pitting edema in lower extremities bilaterally.  Respiratory: Clear to auscultation with no wheezes or rales. Good inspiratory effort. Abdomen: Soft nontender nondistended with normal bowel sounds x4 quadrants. Muskuloskeletal: No cyanosis or clubbing.  2+ pitting edema in lower extremities bilaterally. Neuro: CN II-XII intact, strength, sensation, reflexes Skin: No ulcerative lesions noted or rashes Psychiatry: Judgement and insight appear normal. Mood is appropriate for condition and setting          Labs on Admission:  Basic Metabolic Panel: Recent Labs  Lab 11/28/20 1751  NA 117*  K 3.6  CL 81*  CO2 26  GLUCOSE 117*  BUN 5*  CREATININE 0.97  CALCIUM 8.9   Liver Function Tests: No results for input(s): AST, ALT, ALKPHOS, BILITOT, PROT, ALBUMIN in the  last 168 hours. No results for input(s): LIPASE, AMYLASE in the last 168 hours. No results for input(s): AMMONIA in the last 168 hours. CBC: Recent Labs  Lab 11/28/20 1751  WBC 5.8  HGB 13.3  HCT 38.5*  MCV 89.3  PLT 264   Cardiac Enzymes: No results for input(s): CKTOTAL, CKMB, CKMBINDEX, TROPONINI in the last 168 hours.  BNP (last 3 results) No results for input(s): BNP in the last 8760 hours.  ProBNP (last 3 results) No results for input(s): PROBNP in the last 8760 hours.  CBG: No results for input(s): GLUCAP in the last 168 hours.  Radiological Exams on Admission: No results found.  EKG: I independently viewed the EKG done and my findings are as followed: Sinus rhythm rate of 77.  Nonspecific ST-T changes.  QTc 423.  Assessment/Plan Present on Admission:  Hyponatremia  Active Problems:   Hyponatremia  Severe, likely chronic, hypervolemic hyponatremia Presented with generalized weakness, mild confusion, serum sodium 117, volume overload Patient is on Lasix, ACE  inhibitor and HCTZ, likely contributing to his hyponatremia, have been held. Obtain urine osmolality, urine sodium, serum osmolality, TSH, random cortisol level. He has received 1 L normal saline in the ED Repeat serum sodium stat every 4 hours. Fluid restriction 1200 cc daily. Avoid rapid correction of serum sodium, no more than 8 mEq/L in a 24-hour period. Serials serum sodium Nephrology consulted, Dr. Moshe Cipro.  Acute metabolic encephalopathy likely multifactorial secondary to severe hyponatremia with serum sodium less than 120 and presumptive UTI. Treat underlying conditions Reorient as needed  Presumptive UTI, POA Presented with UA positive for pyuria Obtain urine culture Start Rocephin empirically, DC if urine culture is negative. Allergic to penicillin.  Chronic bilateral lower extremity edema/volume overload Follow BNP and 2D echo Strict I's and O's and daily weights.  Mood swings/Irritability Previously followed with psych outpatient Not on psych medications May consider psych consult is persistent after hyponatremia is treated  Essential hypertension, not at goal. BP is not at goal, elevated. Continue to hold off on ACE inhibitors and home HCTZ due to hyponatremia.  Type 2 diabetes/polyneuropathy Hold off home hypoglycemics Insulin sliding scale Resume home gabapentin  BPH Resume home regimen Monitor urine output    DVT prophylaxis: Subcu Lovenox daily  Code Status: Full code  Family Communication: Daughter at bedside, [name], [phone], requests daily updates.  Disposition Plan: Admit to telemetry medical.  Consults called: Nephrology consulted  Admission status: Inpatient status.  Patient will require at least 2 midnights for further evaluation and treatment of present conditions.   Status is: Inpatient   Dispo:  Patient From: Home  Planned Disposition: Home possibly on 11/30/2020 or when nephrology signs of.  Medically stable for  discharge: No         Kayleen Memos MD [facility] Pager [phone]  If 7PM-7AM, please contact night-coverage [email or web address] Password [facility]  11/28/2020, 9:18 PM

## 2020-11-29 ENCOUNTER — Other Ambulatory Visit: Payer: Self-pay

## 2020-11-29 ENCOUNTER — Encounter (HOSPITAL_COMMUNITY): Payer: Self-pay | Admitting: Internal Medicine

## 2020-11-29 ENCOUNTER — Inpatient Hospital Stay (HOSPITAL_COMMUNITY): Payer: Medicare HMO

## 2020-11-29 DIAGNOSIS — I5031 Acute diastolic (congestive) heart failure: Secondary | ICD-10-CM

## 2020-11-29 LAB — BASIC METABOLIC PANEL
Anion gap: 8 (ref 5–15)
Anion gap: 9 (ref 5–15)
Anion gap: 8 (ref 5–15)
BUN: 6 mg/dL — ABNORMAL LOW (ref 8–23)
BUN: 7 mg/dL — ABNORMAL LOW (ref 8–23)
BUN: 7 mg/dL — ABNORMAL LOW (ref 8–23)
CO2: 27 mmol/L (ref 22–32)
CO2: 29 mmol/L (ref 22–32)
CO2: 31 mmol/L (ref 22–32)
Calcium: 8.8 mg/dL — ABNORMAL LOW (ref 8.9–10.3)
Calcium: 9.1 mg/dL (ref 8.9–10.3)
Calcium: 9.1 mg/dL (ref 8.9–10.3)
Chloride: 88 mmol/L — ABNORMAL LOW (ref 98–111)
Chloride: 86 mmol/L — ABNORMAL LOW (ref 98–111)
Chloride: 87 mmol/L — ABNORMAL LOW (ref 98–111)
Creatinine, Ser: 0.93 mg/dL (ref 0.61–1.24)
Creatinine, Ser: 0.95 mg/dL (ref 0.61–1.24)
Creatinine, Ser: 1.04 mg/dL (ref 0.61–1.24)
GFR, Estimated: >60 mL/min (ref >60)
GFR, Estimated: >60 mL/min (ref >60)
GFR, Estimated: >60 mL/min (ref >60)
Glucose, Bld: 125 mg/dL — ABNORMAL HIGH (ref 70–99)
Glucose, Bld: 123 mg/dL — ABNORMAL HIGH (ref 70–99)
Glucose, Bld: 131 mg/dL — ABNORMAL HIGH (ref 70–99)
Potassium: 4.4 mmol/L (ref 3.5–5.1)
Potassium: 3.8 mmol/L (ref 3.5–5.1)
Potassium: 3.2 mmol/L — ABNORMAL LOW (ref 3.5–5.1)
Sodium: 123 mmol/L — ABNORMAL LOW (ref 135–145)
Sodium: 124 mmol/L — ABNORMAL LOW (ref 135–145)
Sodium: 126 mmol/L — ABNORMAL LOW (ref 135–145)

## 2020-11-29 LAB — ECHOCARDIOGRAM COMPLETE
Area-P 1/2: 2.95 cm2
Height: 70 in
S' Lateral: 3 cm
Weight: 3001.78 oz

## 2020-11-29 LAB — OSMOLALITY, URINE: Osmolality, Ur: 152 mOsm/kg — ABNORMAL LOW (ref 300–900)

## 2020-11-29 LAB — GLUCOSE, CAPILLARY
Glucose-Capillary: 111 mg/dL — ABNORMAL HIGH (ref 70–99)
Glucose-Capillary: 104 mg/dL — ABNORMAL HIGH (ref 70–99)
Glucose-Capillary: 150 mg/dL — ABNORMAL HIGH (ref 70–99)
Glucose-Capillary: 155 mg/dL — ABNORMAL HIGH (ref 70–99)

## 2020-11-29 LAB — RESP PANEL BY RT-PCR (FLU A&B, COVID) ARPGX2
Influenza A by PCR: NEGATIVE
Influenza B by PCR: NEGATIVE
SARS Coronavirus 2 by RT PCR: NEGATIVE

## 2020-11-29 LAB — TSH: TSH: 3.138 u[IU]/mL (ref 0.350–4.500)

## 2020-11-29 LAB — SODIUM: Sodium: 122 mmol/L — ABNORMAL LOW (ref 135–145)

## 2020-11-29 LAB — SODIUM, URINE, RANDOM: Sodium, Ur: 52 mmol/L

## 2020-11-29 LAB — CORTISOL: Cortisol, Plasma: 5.2 ug/dL

## 2020-11-29 LAB — OSMOLALITY: Osmolality: 252 mOsm/kg — ABNORMAL LOW (ref 275–295)

## 2020-11-29 LAB — BRAIN NATRIURETIC PEPTIDE: B Natriuretic Peptide: 51.5 pg/mL (ref 0.0–100.0)

## 2020-11-29 NOTE — Progress Notes (Signed)
  Echocardiogram 2D Echocardiogram has been performed.  Johny Chess 11/29/2020, 10:11 AM

## 2020-11-29 NOTE — Evaluation (Signed)
Occupational Therapy Evaluation Patient Details Name: Donald Calderon MRN: [id number] DOB: 04/03/1941 Today's Date: 11/29/2020    History of Present Illness 80 yo male with onset of confusion and mood change was brought to hosp, has UTI, acute metabolic encephalopathy and LE edema.  PMHx:  EtOH abuse, DM, polyneuropathy, HTN, PNA,   Clinical Impression   Pt admitted to ED for concerns listed above. PTA pt reported independence with all ADL's and IADL's, including taking care of his wife. At the time of the evaluation, pt demonstrated increased weakness and decreased activity tolerance with a short walk in the room. Pt moves slowly and reports that he has difficulty with 2 wheeled walkers, as he is used to his rollator. Overall pt is safe with mobility and ADL's. At this time he is at supervision to min guard levels and expected to continue improving as he improves medically. Acute OT will continue to follow to address concerns detailed below.     Follow Up Recommendations  Home health OT;Supervision - Intermittent    Equipment Recommendations  3 in 1 bedside commode    Recommendations for Other Services       Precautions / Restrictions Precautions Precautions: Fall Precaution Comments: LE edema, mild sensory changes Restrictions Weight Bearing Restrictions: No      Mobility Bed Mobility Overal bed mobility: Needs Assistance Bed Mobility: Supine to Sit;Sit to Supine     Supine to sit: Min guard Sit to supine: Min guard   General bed mobility comments: Min guard for safety, no physical assist    Transfers Overall transfer level: Needs assistance Equipment used: Rolling walker (2 wheeled) Transfers: Sit to/from Stand Sit to Stand: Min guard         General transfer comment: no physical assistance provided    Balance Overall balance assessment: Mild deficits observed, not formally tested                                         ADL either performed  or assessed with clinical judgement   ADL Overall ADL's : Needs assistance/impaired Eating/Feeding: Independent;Sitting   Grooming: Set up;Sitting   Upper Body Bathing: Set up;Sitting   Lower Body Bathing: Min guard;Sitting/lateral leans;Sit to/from stand   Upper Body Dressing : Independent;Sitting   Lower Body Dressing: Min guard;Sitting/lateral leans;Sit to/from stand   Toilet Transfer: Min guard;Ambulation   Toileting- Clothing Manipulation and Hygiene: Min guard;Sit to/from stand;Sitting/lateral lean   Tub/ Shower Transfer: Min guard;Ambulation   Functional mobility during ADLs: Min guard;Rolling walker General ADL Comments: Overall supervision to min guard level for safety, pt moves slow, however reports that is due to the hospital not being set up like his home and using a 2 wheeled walker instead of his rollator.     Vision Baseline Vision/History: Wears glasses Wears Glasses: Reading only Patient Visual Report: No change from baseline Vision Assessment?: No apparent visual deficits     Perception Perception Perception Tested?: No   Praxis Praxis Praxis tested?: Not tested    Pertinent Vitals/Pain Pain Assessment: No/denies pain     Hand Dominance Right   Extremity/Trunk Assessment Upper Extremity Assessment Upper Extremity Assessment: Overall WFL for tasks assessed   Lower Extremity Assessment Lower Extremity Assessment: Defer to PT evaluation   Cervical / Trunk Assessment Cervical / Trunk Assessment: Kyphotic   Communication Communication Communication: HOH   Cognition Arousal/Alertness: Awake/alert Behavior During Therapy: [facility]  for tasks assessed/performed Overall Cognitive Status: No family/caregiver present to determine baseline cognitive functioning                                 General Comments: pt reports his home situation with details   General Comments  VSS on RA    Exercises     Shoulder Instructions      Home  Living Family/patient expects to be discharged to:: Private residence Living Arrangements: Spouse/significant other;Children Available Help at Discharge: Family;Available 24 hours/day Type of Home: House Home Access: Stairs to enter [employer] of Steps: 2 Entrance Stairs-Rails: Right;Left;Can reach both Home Layout: One level     Bathroom Shower/Tub: Occupational psychologist: Standard Bathroom Accessibility: Yes How Accessible: Accessible via walker Home Equipment: Shower seat;Cane - single point;Walker - 4 wheels;Grab bars - toilet;Grab bars - tub/shower   Additional Comments: Pt reports that he assists with taking care of his wife      Prior Functioning/Environment Level of Independence: Independent with assistive device(s)        Comments: was on rollator primarily to mobilize        OT Problem List: Decreased strength;Decreased activity tolerance;Impaired balance (sitting and/or standing);Decreased cognition;Decreased safety awareness;Decreased knowledge of use of DME or AE;Increased edema      OT Treatment/Interventions: Self-care/ADL training;Therapeutic exercise;Energy conservation;DME and/or AE instruction;Therapeutic activities;Patient/family education;Balance training    OT Goals(Current goals can be found in the care plan section) Acute Rehab OT Goals Patient Stated Goal: to get home from hosp OT Goal Formulation: With patient Time For Goal Achievement: 12/13/20 Potential to Achieve Goals: Good ADL Goals Pt Will Perform Grooming: with modified independence;standing Pt Will Transfer to Toilet: with modified independence;ambulating Additional ADL Goal #1: Pt will tolerate OOB activities for 5 mins to improve activity tolerance.  OT Frequency: Min 2X/week   Barriers to D/C: Decreased caregiver support  Pt reports that he has caregivers in place for his wife, however he also was assisting in her care.       Co-evaluation               AM-PAC OT "6 Clicks" Daily Activity     Outcome Measure Help from another person eating meals?: None Help from another person taking care of personal grooming?: A Little Help from another person toileting, which includes using toliet, bedpan, or urinal?: A Little Help from another person bathing (including washing, rinsing, drying)?: A Little Help from another person to put on and taking off regular upper body clothing?: None Help from another person to put on and taking off regular lower body clothing?: A Little 6 Click Score: 20   End of Session Equipment Utilized During Treatment: Rolling walker Nurse Communication: Mobility status  Activity Tolerance: Patient tolerated treatment well Patient left: in bed;with call bell/phone within reach;with nursing/sitter in room  OT Visit Diagnosis: Unsteadiness on feet (R26.81);Other abnormalities of gait and mobility (R26.89);Muscle weakness (generalized) (M62.81)                Time: 7353-2992 OT Time Calculation (min): 19 min Charges:  OT General Charges $OT Visit: 1 Visit OT Evaluation $OT Eval Moderate Complexity: 1 Mod  Karole Oo H., OTR/L Acute Rehabilitation  Shawnika Pepin Elane Elita Dame 11/29/2020, 7:02 PM

## 2020-11-29 NOTE — Progress Notes (Signed)
[facility] Progress Note  Patient: Donald Calderon    KHV:[id number]  DOA: 11/28/2020     Date of Service: the patient was seen and examined on 11/29/2020  Brief hospital course: Past medical history of HTN, type II DM, neuropathy, BPH.  Presents with complaints of confusion found to have hyponatremia.  Likely from hypervolemia.  Currently responding to Lasix. Currently plan is correct sodium gradually.  Assessment and Plan: Severe, likely chronic, hypervolemic hyponatremia Presented with generalized weakness, mild confusion, serum sodium 117, volume overload Patient is on Lasix, ACE inhibitor and HCTZ, likely contributing to his hyponatremia, have been held. Obtain urine osmolality, urine sodium, serum osmolality, TSH, random cortisol level. He has received 1 L normal saline in the ED Repeat serum sodium stat every 4 hours. Fluid restriction 1200 cc daily. Avoid rapid correction of serum sodium, no more than 8 mEq/L in a 24-hour period. Serials serum sodium Nephrology consulted, appreciate assistance.   Acute metabolic encephalopathy likely multifactorial secondary to severe hyponatremia with serum sodium less than 120 and presumptive UTI. Treat underlying conditions Reorient as needed   Presumptive UTI, POA Presented with UA positive for pyuria Obtain urine culture Discontinue antibiotic. Allergic to penicillin.   Chronic bilateral lower extremity edema/volume overload Echocardiogram results reviewed Preserved EF.  No wall motion abnormality.  No valvular abnormality. Strict I's and O's and daily weights.   Mood swings/Irritability Previously followed with psych outpatient Not on psych medications May consider psych consult is persistent after hyponatremia is treated   Essential hypertension, not at goal. BP is not at goal, elevated. Continue to hold off on ACE inhibitors and home HCTZ due to hyponatremia.   Type 2 diabetes/polyneuropathy Hold off home  hypoglycemics Insulin sliding scale Resume home gabapentin   BPH Resume home regimen Monitor urine output  Diet: Regular diet DVT Prophylaxis:   enoxaparin (LOVENOX) injection 40 mg Start: 11/28/20 2130    Advance goals of care discussion: Full code  Family Communication: no family was present at bedside, at the time of interview.  Disposition:  Status is: Inpatient  Remains inpatient appropriate because:IV treatments appropriate due to intensity of illness or inability to take PO  Dispo:  Patient From: Home  Planned Disposition: Home  Medically stable for discharge: No          Subjective: No nausea no vomiting.  Reports that the staff is unable to reach in time to help him to go to the bathroom.  He is making significant amount of urine.  Physical Exam:  General: Appear in mild distress, no Rash; Oral Mucosa Clear, moist. no Abnormal Neck Mass Or lumps, Conjunctiva normal  Cardiovascular: S1 and S2 Present, no Murmur, Respiratory: good respiratory effort, Bilateral Air entry present and CTA, no Crackles, no wheezes Abdomen: Bowel Sound present, Soft and no tenderness Extremities: no Pedal edema Neurology: alert and oriented to time, place, and person affect appropriate. no new focal deficit Gait not checked due to patient safety concerns   Vitals:   11/29/20 0151 11/29/20 0435 11/29/20 1028 11/29/20 1506  BP:  (!) 156/86 (!) 174/91 (!) 165/91  Pulse:  77 64 77  Resp:  18 16 17   Temp:  97.9 F (36.6 C) 98.4 F (36.9 C) 97.7 F (36.5 C)  TempSrc:   Oral Oral  SpO2:  100% 100% 99%  Weight: 85.1 kg     Height:        Intake/Output Summary (Last 24 hours) at 11/29/2020 1858 Last data filed at 11/29/2020 1800  Gross per 24 hour  Intake 1115 ml  Output 5350 ml  Net -4235 ml   Filed Weights   11/29/20 0021 11/29/20 0151  Weight: 85.1 kg 85.1 kg    Data Reviewed: I have personally reviewed and interpreted daily labs, tele strips, imaging. I reviewed all  nursing notes, pharmacy notes, vitals, pertinent old records I have discussed plan of care as described above with RN and patient/family.  CBC: Recent Labs  Lab 11/28/20 1751  WBC 5.8  HGB 13.3  HCT 38.5*  MCV 89.3  PLT 094   Basic Metabolic Panel: Recent Labs  Lab 11/28/20 1751 11/28/20 2112 11/29/20 0032 11/29/20 1133 11/29/20 1620  NA 117* 119* 122* 123* 124*  K 3.6  --   --  4.4 3.8  CL 81*  --   --  88* 86*  CO2 26  --   --  27 29  GLUCOSE 117*  --   --  125* 123*  BUN 5*  --   --  6* 7*  CREATININE 0.97 1.09  --  0.93 0.95  CALCIUM 8.9  --   --  8.8* 9.1    Studies: DG Chest 2 View  Result Date: 11/28/2020 CLINICAL DATA:  Swelling, cough EXAM: CHEST - 2 VIEW COMPARISON:  05/11/2019 FINDINGS: Heart and mediastinal contours are within normal limits. No focal opacities or effusions. No acute bony abnormality. IMPRESSION: No active cardiopulmonary disease. Electronically Signed   By: Rolm Baptise M.D.   On: 11/28/2020 21:25   ECHOCARDIOGRAM COMPLETE  Result Date: 11/29/2020    ECHOCARDIOGRAM REPORT   Patient Name:   Donald Calderon Date of Exam: 11/29/2020 Medical Rec #:  [id number]     Height:       70.0 in Accession #:    [id number]    Weight:       187.6 lb Date of Birth:  Oct 31, 1940    BSA:          2.031 m Patient Age:    80 years      BP:           156/86 mmHg Patient Gender: M             HR:           61 bpm. Exam Location:  Inpatient Procedure: 2D Echo Indications:    acute diastolic chf  History:        Patient has prior history of Echocardiogram examinations, most                 recent 05/12/2019. Risk Factors:Hypertension, Dyslipidemia and                 Diabetes.  Sonographer:    Johny Chess Referring Phys: [id number] [place]  Sonographer Comments: Suboptimal subcostal window. IMPRESSIONS  1. Left ventricular ejection fraction, by estimation, is 60 to 65%. The left ventricle has normal function. The left ventricle has no regional wall motion abnormalities.  Left ventricular diastolic parameters are indeterminate.  2. Right ventricular systolic function is normal. The right ventricular size is normal.  3. Left atrial size was mildly dilated.  4. The mitral valve is grossly normal. Trivial mitral valve regurgitation. No evidence of mitral stenosis.  5. The aortic valve is grossly normal. Aortic valve regurgitation is mild to moderate. No aortic stenosis is present.  6. Aortic dilatation noted. FINDINGS  Left Ventricle: Left ventricular ejection fraction, by estimation, is 60 to 65%. The left ventricle has  normal function. The left ventricle has no regional wall motion abnormalities. The left ventricular internal cavity size was normal in size. There is  no left ventricular hypertrophy. Left ventricular diastolic parameters are indeterminate. Right Ventricle: The right ventricular size is normal. Right vetricular wall thickness was not well visualized. Right ventricular systolic function is normal. Left Atrium: Left atrial size was mildly dilated. Right Atrium: Right atrial size was normal in size. Pericardium: There is no evidence of pericardial effusion. Mitral Valve: The mitral valve is grossly normal. Trivial mitral valve regurgitation. No evidence of mitral valve stenosis. Tricuspid Valve: The tricuspid valve is grossly normal. Tricuspid valve regurgitation is not demonstrated. Aortic Valve: The aortic valve is grossly normal. Aortic valve regurgitation is mild to moderate. No aortic stenosis is present. Pulmonic Valve: The pulmonic valve was grossly normal. Pulmonic valve regurgitation is trivial. Aorta: Aortic dilatation noted. IAS/Shunts: The atrial septum is grossly normal.  LEFT VENTRICLE PLAX 2D LVIDd:         4.80 cm Diastology LVIDs:         3.00 cm LV e' medial:    7.07 cm/s LV PW:         1.00 cm LV E/e' medial:  10.0 LV IVS:        1.10 cm LV e' lateral:   10.80 cm/s                        LV E/e' lateral: 6.6  RIGHT VENTRICLE RV S prime:     13.70 cm/s  TAPSE (M-mode): 1.8 cm LEFT ATRIUM             Index LA diam:        3.50 cm 1.72 cm/m LA Vol (A2C):   89.3 ml 43.96 ml/m LA Vol (A4C):   51.2 ml 25.20 ml/m LA Biplane Vol: 70.7 ml 34.80 ml/m  AORTIC VALVE LVOT Vmax:   112.00 cm/s LVOT Vmean:  68.700 cm/s LVOT VTI:    0.243 m  AORTA Ao Root diam: 4.00 cm Ao Asc diam:  4.10 cm MITRAL VALVE MV Area (PHT): 2.95 cm    SHUNTS MV Decel Time: 257 msec    Systemic VTI: 0.24 m MV E velocity: 71.00 cm/s MV A velocity: 99.40 cm/s MV E/A ratio:  0.71 Mertie Moores MD Electronically signed by Mertie Moores MD Signature Date/Time: 11/29/2020/1:01:41 PM    Final     Scheduled Meds:  amLODipine  10 mg Oral Daily   enoxaparin (LOVENOX) injection  40 mg Subcutaneous Q24H   furosemide  40 mg Oral Daily   gabapentin  600 mg Oral QHS   insulin aspart  0-5 Units Subcutaneous QHS   insulin aspart  0-9 Units Subcutaneous TID WC   metoprolol succinate  25 mg Oral Daily   pantoprazole  40 mg Oral Daily   pravastatin  10 mg Oral QPM   Continuous Infusions:  cefTRIAXone (ROCEPHIN)  IV Stopped (11/28/20 2327)   PRN Meds: acetaminophen, hydrALAZINE, melatonin, ondansetron (ZOFRAN) IV, polyethylene glycol  Time spent: 35 minutes  Author: Berle Mull, MD [facility] 11/29/2020 6:58 PM  To reach On-call, see care teams to locate the attending and reach out via [email or web address]. Between 7PM-7AM, please contact night-coverage If you still have difficulty reaching the attending provider, please page the [facility] (Director on Call) for [facility] on amion for assistance.

## 2020-11-29 NOTE — Evaluation (Signed)
Physical Therapy Evaluation Patient Details Name: Donald Calderon MRN: [id number] DOB: 1940/07/17 Today's Date: 11/29/2020   History of Present Illness  80 yo male with onset of confusion and mood change was brought to hosp, has UTI, acute metabolic encephalopathy and LE edema.  PMHx:  EtOH abuse, DM, polyneuropathy, HTN, PNA,  Clinical Impression  Pt was seen for mobility with RW to get to chair, noted his weakness and discomfort with edema and sensitivity on BLE's.  Positioned legs to elevate and manage discomfort and edema, with plan to work on LE strength, balance and endurance to enhance independent gait and transfers.  Follow acutely as time and pt allow.    Follow Up Recommendations SNF    Equipment Recommendations  Rolling walker with 5" wheels    Recommendations for Other Services       Precautions / Restrictions Precautions Precautions: Fall Precaution Comments: LE edema, mild sensory changes Restrictions Weight Bearing Restrictions: No      Mobility  Bed Mobility Overal bed mobility: Needs Assistance Bed Mobility: Supine to Sit     Supine to sit: Min assist     General bed mobility comments: min assist to support trunk and assist to scoot out to EOB    Transfers Overall transfer level: Needs assistance Equipment used: Rolling walker (2 wheeled);1 person hand held assist Transfers: Sit to/from Stand Sit to Stand: Min assist;Mod assist         General transfer comment: requires less assist with more repetitions  Ambulation/Gait Ambulation/Gait assistance: Min guard Gait Distance (Feet): 5 Feet Assistive device: Rolling walker (2 wheeled);1 person hand held assist Gait Pattern/deviations: Step-to pattern;Wide base of support;Decreased stride length Gait velocity: reduced   General Gait Details: side steps to chair next to bed  Stairs            Wheelchair Mobility    Modified Rankin (Stroke Patients Only)       Balance Overall balance  assessment: Needs assistance Sitting-balance support: Feet supported Sitting balance-Leahy Scale: Fair     Standing balance support: Bilateral upper extremity supported;During functional activity Standing balance-Leahy Scale: Poor                               Pertinent Vitals/Pain Pain Assessment: Faces Faces Pain Scale: Hurts little more Pain Location: legs due to neuropathy Pain Descriptors / Indicators: Guarding;Shooting Pain Intervention(s): Monitored during session;Premedicated before session;Repositioned    Home Living Family/patient expects to be discharged to:: Private residence Living Arrangements: Spouse/significant other;Children Available Help at Discharge: Family;Available 24 hours/day Type of Home: House Home Access: Stairs to enter Entrance Stairs-Rails: Right;Left;Can reach both Entrance Stairs-Number of Steps: 2 Home Layout: One level Home Equipment: Shower seat;Cane - single point;Walker - 4 wheels;Grab bars - toilet;Grab bars - tub/shower Additional Comments: pt reports wife cannot assist him    Prior Function Level of Independence: Independent with assistive device(s)         Comments: was on rollator primarily to mobilize     Hand Dominance   Dominant Hand: Right    Extremity/Trunk Assessment   Upper Extremity Assessment Upper Extremity Assessment: Overall WFL for tasks assessed    Lower Extremity Assessment Lower Extremity Assessment: Generalized weakness    Cervical / Trunk Assessment Cervical / Trunk Assessment: Kyphotic  Communication   Communication: HOH (mild)  Cognition Arousal/Alertness: Awake/alert Behavior During Therapy: WFL for tasks assessed/performed Overall Cognitive Status: Within Functional Limits for tasks assessed  General Comments: pt reports his home situation with details      General Comments General comments (skin integrity, edema, etc.): pt is  assisted to chair with sidesteps, and expresses a clear interest in going directly home.  Talked with him about safety, but he is likely to go directly home    Exercises     Assessment/Plan    PT Assessment Patient needs continued PT services  PT Problem List Decreased strength;Decreased range of motion;Decreased activity tolerance;Decreased balance;Decreased mobility;Decreased coordination;Decreased cognition;Decreased knowledge of use of DME;Decreased safety awareness       PT Treatment Interventions DME instruction;Gait training;Stair training;Functional mobility training;Therapeutic activities;Therapeutic exercise;Balance training;Neuromuscular re-education;Patient/family education    PT Goals (Current goals can be found in the Care Plan section)  Acute Rehab PT Goals Patient Stated Goal: to get home from hosp PT Goal Formulation: With patient Time For Goal Achievement: 12/13/20 Potential to Achieve Goals: Good    Frequency Min 3X/week   Barriers to discharge Decreased caregiver support home with wife at times with pt being unable to be assisted by her    Co-evaluation               AM-PAC PT "6 Clicks" Mobility  Outcome Measure Help needed turning from your back to your side while in a flat bed without using bedrails?: A Little Help needed moving from lying on your back to sitting on the side of a flat bed without using bedrails?: A Little Help needed moving to and from a bed to a chair (including a wheelchair)?: A Little Help needed standing up from a chair using your arms (e.g., wheelchair or bedside chair)?: A Little Help needed to walk in hospital room?: A Little Help needed climbing 3-5 steps with a railing? : Total 6 Click Score: 16    End of Session Equipment Utilized During Treatment: Gait belt Activity Tolerance: Patient tolerated treatment well;Patient limited by fatigue Patient left: in chair;with call bell/phone within reach;with chair alarm set Nurse  Communication: Mobility status PT Visit Diagnosis: Unsteadiness on feet (R26.81);Muscle weakness (generalized) (M62.81);Difficulty in walking, not elsewhere classified (R26.2)    Time: 3646-8032 PT Time Calculation (min) (ACUTE ONLY): 31 min   Charges:   PT Evaluation $PT Eval Moderate Complexity: 1 Mod PT Treatments $Therapeutic Activity: 8-22 mins       Ramond Dial 11/29/2020, 3:05 PM Mee Hives, PT MS Acute Rehab Dept. Number: [place] [facility] [place]

## 2020-11-29 NOTE — Progress Notes (Signed)
  [facility] Progress Note   Assessment/ Plan:   1) Hyponatremia-  seems like hypervolemic hyponatremia on initial eval.  Serum osms low, Uosms maximally dilute with Una 52 in the setting of diuretics.  Continue Lasix, serial sodiums, and fluid restriction.    2.  Acute encephalopathy: now improving  3.  Presumptive UTI- on ceftriaxone  4.  DM II: per primary  5.  Dispo: pending  Subjective:    Na improved overnight to 122.  No complaints this AM.  Labs pending today.   Objective:   BP (!) 156/86 (BP Location: Right Arm)   Pulse 77   Temp 97.9 F (36.6 C)   Resp 18   Ht 5\' 10"  (1.778 m)   Wt 85.1 kg   SpO2 100%   BMI 26.92 kg/m   Physical Exam: Gen: NAD CVS: RRR Resp: clear Abd: soft Ext: no LE edema NEURO: AAO x 3 nonfocal and no AMS/ seizure activity  Labs: BMET Recent Labs  Lab 11/28/20 1751 11/28/20 2112 11/29/20 0032  NA 117* 119* 122*  K 3.6  --   --   CL 81*  --   --   CO2 26  --   --   GLUCOSE 117*  --   --   BUN 5*  --   --   CREATININE 0.97 1.09  --   CALCIUM 8.9  --   --    CBC Recent Labs  Lab 11/28/20 1751  WBC 5.8  HGB 13.3  HCT 38.5*  MCV 89.3  PLT 264      Medications:     amLODipine  10 mg Oral Daily   enoxaparin (LOVENOX) injection  40 mg Subcutaneous Q24H   furosemide  40 mg Oral Daily   gabapentin  600 mg Oral QHS   insulin aspart  0-5 Units Subcutaneous QHS   insulin aspart  0-9 Units Subcutaneous TID WC   metoprolol succinate  25 mg Oral Daily   pantoprazole  40 mg Oral Daily   pravastatin  10 mg Oral QPM     Madelon Lips MD 11/29/2020, 9:57 AM

## 2020-11-29 NOTE — TOC Initial Note (Signed)
Transition of Care [facility]) - Initial/Assessment Note    Patient Details  Name: Donald Calderon MRN: [id number] Date of Birth: 1941/02/17  Transition of Care [facility]) CM/SW Contact:    Verdell Carmine, RN Phone Number: 11/29/2020, 3:02 PM  Clinical Narrative:                  Patient admitted with hyponitremia,  confusion has been drinking lots of water and takes lasix  Na+ 117. More alert this AM. CM will follow for needs   Expected Discharge Plan: Home/Self Care Barriers to Discharge: Continued Medical Work up   Patient Goals and CMS Choice        Expected Discharge Plan and Services Expected Discharge Plan: Home/Self Care       Living arrangements for the past 2 months: Single Family Home                                      Prior Living Arrangements/Services Living arrangements for the past 2 months: Single Family Home Lives with:: Spouse Patient language and need for interpreter reviewed:: Yes        Need for Family Participation in Patient Care: Yes (Comment) Care giver support system in place?: Yes (comment)   Criminal Activity/Legal Involvement Pertinent to Current Situation/Hospitalization: No - Comment as needed  Activities of Daily Living Home Assistive Devices/Equipment: Walker (specify type) (Rollator) ADL Screening (condition at time of admission) Patient's cognitive ability adequate to safely complete daily activities?: Yes Is the patient deaf or have difficulty hearing?: No Does the patient have difficulty seeing, even when wearing glasses/contacts?: Yes Does the patient have difficulty concentrating, remembering, or making decisions?: No Patient able to express need for assistance with ADLs?: Yes Does the patient have difficulty dressing or bathing?: No Independently performs ADLs?: No Communication: Independent Dressing (OT): Needs assistance Is this a change from baseline?: Change from baseline, expected to last <3days Grooming:  Appropriate for developmental age Feeding: Independent Bathing: Needs assistance Is this a change from baseline?: Change from baseline, expected to last <3 days Toileting: Needs assistance Is this a change from baseline?: Pre-admission baseline In/Out Bed: Needs assistance Walks in Home: Independent with device (comment) Does the patient have difficulty walking or climbing stairs?: Yes Weakness of Legs: Both Weakness of Arms/Hands: None  Permission Sought/Granted                  Emotional Assessment       Orientation: : Oriented to Self, Oriented to Place, Oriented to  Time, Oriented to Situation Alcohol / Substance Use: Not Applicable Psych Involvement: No (comment)  Admission diagnosis:  Confusion [R41.0] Hyponatremia [E87.1] Generalized weakness [R53.1] Patient Active Problem List   Diagnosis Date Noted   Sepsis due to undetermined organism ([place]) 05/11/2019   Hyperkalemia 05/11/2019   Hyperbilirubinemia 05/11/2019   Pressure injury of skin 03/10/2017   E coli UTI (urinary tract infection) 89/38/1017   Acute metabolic encephalopathy 51/07/5850   Encephalopathy 03/07/2017   Normocytic anemia 02/23/2017   Neurogenic bladder 02/23/2017   AKI (acute kidney injury) ([place]) 02/23/2017   Sepsis secondary to UTI ([place]) 02/22/2017   Postoperative ileus ([place]) 02/12/2017   Urine retention 01/29/2017   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   Lumbar stenosis 01/26/2017   Diabetes mellitus type 2 in obese ([place])    Hypertension associated with diabetes ([place])    ETOH abuse    Chronic pain  syndrome    Hyponatremia    Leukocytosis    Acute blood loss anemia    Epidural hematoma ([facility]) 01/23/2017   Status post lumbar surgery 01/21/2017   Status post lumbar spine operation 01/21/2017   Chronic back pain 09/27/2011   NEUROPATHY 08/08/2010   SWELLING MASS OR LUMP IN HEAD AND NECK 06/27/2010   BENIGN PROSTATIC HYPERTROPHY, WITH URINARY OBSTRUCTION 06/19/2010   Hereditary and  idiopathic peripheral neuropathy 05/12/2010   HYPOKALEMIA 03/18/2010   LIPOMA 02/14/2010   Diabetes ([place]) 02/07/2010   HYPERLIPIDEMIA 02/07/2010   ALCOHOL ABUSE 02/07/2010   HYPERTENSION 02/07/2010   SPINAL STENOSIS, LUMBAR 02/07/2010   PCP:  Medicine, [facility]:   [place] ([place]), [place] - [address] [place] [address] [address] Phone: [phone] Fax: [place] [address] [place] [address] Phone: [phone] Fax: [place], [place] [address] Phone: [phone] Fax: [phone]  [id number] - [place], [place] [address] Phone: [phone] Fax: [phone]     Social Determinants of Health (SDOH) Interventions    Readmission Risk Interventions No flowsheet data found.

## 2020-11-29 NOTE — Plan of Care (Signed)
Received patient from ED. Alert, oriented, without distress. Patient settled into room, admission assessment completed. Will continue to monitor according to plan of care and orders.

## 2020-11-30 LAB — CBC WITH DIFFERENTIAL/PLATELET
Abs Immature Granulocytes: 0.02 10*3/uL (ref 0.00–0.07)
Basophils Absolute: 0 10*3/uL (ref 0.0–0.1)
Basophils Relative: 1 %
Eosinophils Absolute: 0.1 10*3/uL (ref 0.0–0.5)
Eosinophils Relative: 1 %
HCT: 38.5 % — ABNORMAL LOW (ref 39.0–52.0)
Hemoglobin: 13.4 g/dL (ref 13.0–17.0)
Immature Granulocytes: 0 %
Lymphocytes Relative: 14 %
Lymphs Abs: 0.9 10*3/uL (ref 0.7–4.0)
MCH: 31 pg (ref 26.0–34.0)
MCHC: 34.8 g/dL (ref 30.0–36.0)
MCV: 89.1 fL (ref 80.0–100.0)
Monocytes Absolute: 0.9 10*3/uL (ref 0.1–1.0)
Monocytes Relative: 14 %
Neutro Abs: 4.6 10*3/uL (ref 1.7–7.7)
Neutrophils Relative %: 70 %
Platelets: 240 10*3/uL (ref 150–400)
RBC: 4.32 MIL/uL (ref 4.22–5.81)
RDW: 12.3 % (ref 11.5–15.5)
WBC: 6.5 10*3/uL (ref 4.0–10.5)
nRBC: 0 % (ref 0.0–0.2)

## 2020-11-30 LAB — COMPREHENSIVE METABOLIC PANEL
ALT: 11 U/L (ref 0–44)
AST: 17 U/L (ref 15–41)
Albumin: 3 g/dL — ABNORMAL LOW (ref 3.5–5.0)
Alkaline Phosphatase: 43 U/L (ref 38–126)
Anion gap: 9 (ref 5–15)
BUN: 8 mg/dL (ref 8–23)
CO2: 28 mmol/L (ref 22–32)
Calcium: 8.9 mg/dL (ref 8.9–10.3)
Chloride: 89 mmol/L — ABNORMAL LOW (ref 98–111)
Creatinine, Ser: 1.07 mg/dL (ref 0.61–1.24)
GFR, Estimated: >60 mL/min (ref >60)
Glucose, Bld: 122 mg/dL — ABNORMAL HIGH (ref 70–99)
Potassium: 3.1 mmol/L — ABNORMAL LOW (ref 3.5–5.1)
Sodium: 126 mmol/L — ABNORMAL LOW (ref 135–145)
Total Bilirubin: 0.8 mg/dL (ref 0.3–1.2)
Total Protein: 5.8 g/dL — ABNORMAL LOW (ref 6.5–8.1)

## 2020-11-30 LAB — BASIC METABOLIC PANEL
Anion gap: 9 (ref 5–15)
BUN: 8 mg/dL (ref 8–23)
CO2: 30 mmol/L (ref 22–32)
Calcium: 9.2 mg/dL (ref 8.9–10.3)
Chloride: 87 mmol/L — ABNORMAL LOW (ref 98–111)
Creatinine, Ser: 1.1 mg/dL (ref 0.61–1.24)
GFR, Estimated: >60 mL/min (ref >60)
Glucose, Bld: 138 mg/dL — ABNORMAL HIGH (ref 70–99)
Potassium: 3.3 mmol/L — ABNORMAL LOW (ref 3.5–5.1)
Sodium: 126 mmol/L — ABNORMAL LOW (ref 135–145)

## 2020-11-30 LAB — URINE CULTURE

## 2020-11-30 LAB — HEMOGLOBIN A1C
Hgb A1c MFr Bld: 5.8 % — ABNORMAL HIGH (ref 4.8–5.6)
Mean Plasma Glucose: 120 mg/dL

## 2020-11-30 LAB — GLUCOSE, CAPILLARY
Glucose-Capillary: 155 mg/dL — ABNORMAL HIGH (ref 70–99)
Glucose-Capillary: 232 mg/dL — ABNORMAL HIGH (ref 70–99)
Glucose-Capillary: 164 mg/dL — ABNORMAL HIGH (ref 70–99)
Glucose-Capillary: 101 mg/dL — ABNORMAL HIGH (ref 70–99)

## 2020-11-30 LAB — MAGNESIUM: Magnesium: 1.4 mg/dL — ABNORMAL LOW (ref 1.7–2.4)

## 2020-11-30 NOTE — Progress Notes (Signed)
PROGRESS NOTE    Donald Calderon  TKP:[id number] DOB: 1940/10/02 DOA: 11/28/2020 PCP: Medicine, [facility]   Brief Narrative:   Past medical history of HTN, type II DM, neuropathy, BPH.  Presents with complaints of confusion found to have hyponatremia.  Likely from hypervolemia.  Currently responding to Lasix.  6/18: Scr is up to 126. Needs to be 130 prior to d/c. Will continue lasix/fluid restriction. His mentation is good this morning.   Assessment & Plan: Severe, likely chronic, hypervolemic hyponatremia     - Presented with generalized weakness, mild confusion, Na+ 117, volume overload     - continue serial Na+ checks, lasix, fluid restriction     - Na+ improved to 126; goal prior to discharge is at least 130     - nephro has signed off     - avoid rapid correction of serum sodium, no more than 8 mEq/L in a 24-hour period.   Acute metabolic encephalopathy likely multifactorial secondary to severe hyponatremia     - Treat Na+     - mentation imprved today; follow   UTI, POA     - UCx w/ multiple species     - on rocephin; reasonable to continue for now   Chronic bilateral lower extremity edema/volume overload     - Echocardiogram results reviewed; EF 60-65%     - Strict I&O and daily weights.   Mood swings/Irritability     - Previously followed with psych outpatient     - Not on psych medications     - May consider psych consult is persistent after hyponatremia is treated   Essential hypertension     - metoprolol, lasix, amlodipine; BP acceptable   Type 2 diabetes/polyneuropathy     - SSI, glucose check, DM diet; continue gabapentin    BPH     - continue home reigmen  DVT prophylaxis: lovenox Code Status: FULL Family Communication: None at bedside   Status is: Inpatient  Remains inpatient appropriate because:Inpatient level of care appropriate due to severity of illness  Dispo:  Patient From: Home  Planned Disposition: Home  Medically stable for  discharge: No     Consultants:  Nephrology  Antimicrobials:  rocephin   Subjective: "I'm feeling better"  Objective: Vitals:   11/29/20 1940 11/30/20 0408 11/30/20 0500 11/30/20 1245  BP: (!) 158/84 137/88  124/79  Pulse: 81 68  72  Resp: 17 16  18   Temp: 98.6 F (37 C) 97.9 F (36.6 C)  98.2 F (36.8 C)  TempSrc: Oral Oral  Oral  SpO2: 100% 100%  98%  Weight:   85.1 kg   Height:        Intake/Output Summary (Last 24 hours) at 11/30/2020 1500 Last data filed at 11/30/2020 0530 Gross per 24 hour  Intake 400 ml  Output 5000 ml  Net -4600 ml   Filed Weights   11/29/20 0021 11/29/20 0151 11/30/20 0500  Weight: 85.1 kg 85.1 kg 85.1 kg    Examination:  General: 80 y.o. male resting in bed in NAD Eyes: PERRL, normal sclera ENMT: Nares patent w/o discharge, orophaynx clear, dentition normal, ears w/o discharge/lesions/ulcers Neck: Supple, trachea midline Cardiovascular: RRR, +S1, S2, no m/g/r, equal pulses throughout Respiratory: CTABL, no w/r/r, normal WOB GI: BS+, NDNT, no masses noted, no organomegaly noted MSK: No e/c/c Skin: No rashes, bruises, ulcerations noted Neuro: Alert to name, follows commands, blind Psyc: Appropriate interaction and affect, calm/cooperative   Data Reviewed: I have personally  reviewed following labs and imaging studies.  CBC: Recent Labs  Lab 11/28/20 1751 11/30/20 0543  WBC 5.8 6.5  NEUTROABS  --  4.6  HGB 13.3 13.4  HCT 38.5* 38.5*  MCV 89.3 89.1  PLT 264 518   Basic Metabolic Panel: Recent Labs  Lab 11/29/20 1133 11/29/20 1620 11/29/20 2102 11/30/20 0234 11/30/20 0543  NA 123* 124* 126* 126* 126*  K 4.4 3.8 3.2* 3.3* 3.1*  CL 88* 86* 87* 87* 89*  CO2 27 29 31 30 28   GLUCOSE 125* 123* 131* 138* 122*  BUN 6* 7* 7* 8 8  CREATININE 0.93 0.95 1.04 1.10 1.07  CALCIUM 8.8* 9.1 9.1 9.2 8.9  MG  --   --   --   --  1.4*   GFR: Estimated Creatinine Clearance: 57.8 mL/min (by C-G formula based on SCr of 1.07  mg/dL). Liver Function Tests: Recent Labs  Lab 11/30/20 0543  AST 17  ALT 11  ALKPHOS 43  BILITOT 0.8  PROT 5.8*  ALBUMIN 3.0*   No results for input(s): LIPASE, AMYLASE in the last 168 hours. No results for input(s): AMMONIA in the last 168 hours. Coagulation Profile: No results for input(s): INR, PROTIME in the last 168 hours. Cardiac Enzymes: No results for input(s): CKTOTAL, CKMB, CKMBINDEX, TROPONINI in the last 168 hours. BNP (last 3 results) No results for input(s): PROBNP in the last 8760 hours. HbA1C: Recent Labs    11/29/20 0032  HGBA1C 5.8*   CBG: Recent Labs  Lab 11/29/20 1112 11/29/20 1730 11/29/20 2057 11/30/20 0759 11/30/20 1242  GLUCAP 104* 150* 155* 155* 232*   Lipid Profile: No results for input(s): CHOL, HDL, LDLCALC, TRIG, CHOLHDL, LDLDIRECT in the last 72 hours. Thyroid Function Tests: Recent Labs    11/29/20 0032  TSH 3.138   Anemia Panel: No results for input(s): VITAMINB12, FOLATE, FERRITIN, TIBC, IRON, RETICCTPCT in the last 72 hours. Sepsis Labs: No results for input(s): PROCALCITON, LATICACIDVEN in the last 168 hours.  Recent Results (from the past 240 hour(s))  Resp Panel by RT-PCR (Flu A&B, Covid) Nasopharyngeal Swab     Status: None   Collection Time: 11/28/20 10:25 PM   Specimen: Nasopharyngeal Swab; Nasopharyngeal(NP) swabs in vial transport medium  Result Value Ref Range Status   SARS Coronavirus 2 by RT PCR NEGATIVE NEGATIVE Final    Comment: (NOTE) SARS-CoV-2 target nucleic acids are NOT DETECTED.  The SARS-CoV-2 RNA is generally detectable in upper respiratory specimens during the acute phase of infection. The lowest concentration of SARS-CoV-2 viral copies this assay can detect is 138 copies/mL. A negative result does not preclude SARS-Cov-2 infection and should not be used as the sole basis for treatment or other patient management decisions. A negative result may occur with  improper specimen collection/handling,  submission of specimen other than nasopharyngeal swab, presence of viral mutation(s) within the areas targeted by this assay, and inadequate number of viral copies(<138 copies/mL). A negative result must be combined with clinical observations, patient history, and epidemiological information. The expected result is Negative.  Fact Sheet for Patients:  EntrepreneurPulse.com.au  Fact Sheet for Healthcare Providers:  IncredibleEmployment.be  This test is no t yet approved or cleared by the [place] FDA and  has been authorized for detection and/or diagnosis of SARS-CoV-2 by FDA under an Emergency Use Authorization (EUA). This EUA will remain  in effect (meaning this test can be used) for the duration of the COVID-19 declaration under Section 564(b)(1) of the Act, 21 U.S.C.section 360bbb-3(b)(1),  unless the authorization is terminated  or revoked sooner.       Influenza A by PCR NEGATIVE NEGATIVE Final   Influenza B by PCR NEGATIVE NEGATIVE Final    Comment: (NOTE) The Xpert Xpress SARS-CoV-2/FLU/RSV plus assay is intended as an aid in the diagnosis of influenza from Nasopharyngeal swab specimens and should not be used as a sole basis for treatment. Nasal washings and aspirates are unacceptable for Xpert Xpress SARS-CoV-2/FLU/RSV testing.  Fact Sheet for Patients: EntrepreneurPulse.com.au  Fact Sheet for Healthcare Providers: IncredibleEmployment.be  This test is not yet approved or cleared by the [place] FDA and has been authorized for detection and/or diagnosis of SARS-CoV-2 by FDA under an Emergency Use Authorization (EUA). This EUA will remain in effect (meaning this test can be used) for the duration of the COVID-19 declaration under Section 564(b)(1) of the Act, 21 U.S.C. section 360bbb-3(b)(1), unless the authorization is terminated or revoked.  Performed at [facility], [place] [address], [place], [place] [address]   Culture, Urine     Status: Abnormal   Collection Time: 11/28/20 11:54 PM   Specimen: Urine, Random  Result Value Ref Range Status   Specimen Description URINE, RANDOM  Final   Special Requests   Final    NONE Performed at [facility], [place] [address], [place], [place] [address]    Culture MULTIPLE SPECIES PRESENT, SUGGEST RECOLLECTION (A)  Final   Report Status 11/30/2020 FINAL  Final      Radiology Studies: DG Chest 2 View  Result Date: 11/28/2020 CLINICAL DATA:  Swelling, cough EXAM: CHEST - 2 VIEW COMPARISON:  05/11/2019 FINDINGS: Heart and mediastinal contours are within normal limits. No focal opacities or effusions. No acute bony abnormality. IMPRESSION: No active cardiopulmonary disease. Electronically Signed   By: Rolm Baptise M.D.   On: 11/28/2020 21:25   ECHOCARDIOGRAM COMPLETE  Result Date: 11/29/2020    ECHOCARDIOGRAM REPORT   Patient Name:   Donald Calderon Date of Exam: 11/29/2020 Medical Rec #:  [id number]     Height:       70.0 in Accession #:    [id number]    Weight:       187.6 lb Date of Birth:  November 30, 1940    BSA:          2.031 m Patient Age:    27 years      BP:           156/86 mmHg Patient Gender: M             HR:           61 bpm. Exam Location:  Inpatient Procedure: 2D Echo Indications:    acute diastolic chf  History:        Patient has prior history of Echocardiogram examinations, most                 recent 05/12/2019. Risk Factors:Hypertension, Dyslipidemia and                 Diabetes.  Sonographer:    Johny Chess Referring Phys: [id number] [place]  Sonographer Comments: Suboptimal subcostal window. IMPRESSIONS  1. Left ventricular ejection fraction, by estimation, is 60 to 65%. The left ventricle has normal function. The left ventricle has no regional wall motion abnormalities. Left ventricular diastolic parameters are indeterminate.  2. Right ventricular systolic function is normal. The right ventricular size is  normal.  3. Left atrial size was mildly dilated.  4. The mitral valve is grossly normal. Trivial mitral valve regurgitation. No evidence of mitral stenosis.  5. The aortic valve is grossly normal. Aortic valve regurgitation is mild to moderate. No aortic stenosis is present.  6. Aortic dilatation noted. FINDINGS  Left Ventricle: Left ventricular ejection fraction, by estimation, is 60 to 65%. The left ventricle has normal function. The left ventricle has no regional wall motion abnormalities. The left ventricular internal cavity size was normal in size. There is  no left ventricular hypertrophy. Left ventricular diastolic parameters are indeterminate. Right Ventricle: The right ventricular size is normal. Right vetricular wall thickness was not well visualized. Right ventricular systolic function is normal. Left Atrium: Left atrial size was mildly dilated. Right Atrium: Right atrial size was normal in size. Pericardium: There is no evidence of pericardial effusion. Mitral Valve: The mitral valve is grossly normal. Trivial mitral valve regurgitation. No evidence of mitral valve stenosis. Tricuspid Valve: The tricuspid valve is grossly normal. Tricuspid valve regurgitation is not demonstrated. Aortic Valve: The aortic valve is grossly normal. Aortic valve regurgitation is mild to moderate. No aortic stenosis is present. Pulmonic Valve: The pulmonic valve was grossly normal. Pulmonic valve regurgitation is trivial. Aorta: Aortic dilatation noted. IAS/Shunts: The atrial septum is grossly normal.  LEFT VENTRICLE PLAX 2D LVIDd:         4.80 cm Diastology LVIDs:         3.00 cm LV e' medial:    7.07 cm/s LV PW:         1.00 cm LV E/e' medial:  10.0 LV IVS:        1.10 cm LV e' lateral:   10.80 cm/s                        LV E/e' lateral: 6.6  RIGHT VENTRICLE RV S prime:     13.70 cm/s TAPSE (M-mode): 1.8 cm LEFT ATRIUM             Index LA diam:        3.50 cm 1.72 cm/m LA Vol (A2C):   89.3 ml 43.96 ml/m LA Vol (A4C):    51.2 ml 25.20 ml/m LA Biplane Vol: 70.7 ml 34.80 ml/m  AORTIC VALVE LVOT Vmax:   112.00 cm/s LVOT Vmean:  68.700 cm/s LVOT VTI:    0.243 m  AORTA Ao Root diam: 4.00 cm Ao Asc diam:  4.10 cm MITRAL VALVE MV Area (PHT): 2.95 cm    SHUNTS MV Decel Time: 257 msec    Systemic VTI: 0.24 m MV E velocity: 71.00 cm/s MV A velocity: 99.40 cm/s MV E/A ratio:  0.71 Mertie Moores MD Electronically signed by Mertie Moores MD Signature Date/Time: 11/29/2020/1:01:41 PM    Final      Scheduled Meds:  amLODipine  10 mg Oral Daily   enoxaparin (LOVENOX) injection  40 mg Subcutaneous Q24H   furosemide  40 mg Oral Daily   gabapentin  600 mg Oral QHS   insulin aspart  0-5 Units Subcutaneous QHS   insulin aspart  0-9 Units Subcutaneous TID WC   metoprolol succinate  25 mg Oral Daily   pantoprazole  40 mg Oral Daily   pravastatin  10 mg Oral QPM   Continuous Infusions:  cefTRIAXone (ROCEPHIN)  IV 200 mL/hr at 11/29/20 2152     LOS: 2 days    Time spent: 25 minutes   Jonnie Finner, DO [facility]  If 7PM-7AM, please contact night-coverage [email or web address]  11/30/2020, 3:00 PM

## 2020-11-30 NOTE — Progress Notes (Signed)
  [facility] Progress Note   Assessment/ Plan:   1) Hyponatremia-  seems like hypervolemic hyponatremia on initial eval.  Serum osms low, Uosms maximally dilute with Una 52 in the setting of diuretics.  Continue Lasix, serial sodiums, and fluid restriction.   -would feel OK with pt going home when Na arouns 130- will decrease risk of falls - nothing further to add- will sign off, call with questions  2.  Acute encephalopathy: now improving  3.  Presumptive UTI- on ceftriaxone  4.  DM II: per primary  5.  Dispo: pending  Subjective:   Na up to 126.  MS much better.  Dtr at bedside.   Objective:   BP 137/88 (BP Location: Left Arm)   Pulse 68   Temp 97.9 F (36.6 C) (Oral)   Resp 16   Ht 5\' 10"  (1.778 m)   Wt 85.1 kg   SpO2 100%   BMI 26.92 kg/m   Physical Exam: Gen: NAD CVS: RRR Resp: clear Abd: soft Ext: no LE edema NEURO: AAO x 3 nonfocal and no AMS/ seizure activity  Labs: BMET Recent Labs  Lab 11/28/20 1751 11/28/20 2112 11/29/20 0032 11/29/20 1133 11/29/20 1620 11/29/20 2102 11/30/20 0234 11/30/20 0543  NA 117* 119* 122* 123* 124* 126* 126* 126*  K 3.6  --   --  4.4 3.8 3.2* 3.3* 3.1*  CL 81*  --   --  88* 86* 87* 87* 89*  CO2 26  --   --  27 29 31 30 28   GLUCOSE 117*  --   --  125* 123* 131* 138* 122*  BUN 5*  --   --  6* 7* 7* 8 8  CREATININE 0.97 1.09  --  0.93 0.95 1.04 1.10 1.07  CALCIUM 8.9  --   --  8.8* 9.1 9.1 9.2 8.9   CBC Recent Labs  Lab 11/28/20 1751 11/30/20 0543  WBC 5.8 6.5  NEUTROABS  --  4.6  HGB 13.3 13.4  HCT 38.5* 38.5*  MCV 89.3 89.1  PLT 264 240      Medications:     amLODipine  10 mg Oral Daily   enoxaparin (LOVENOX) injection  40 mg Subcutaneous Q24H   furosemide  40 mg Oral Daily   gabapentin  600 mg Oral QHS   insulin aspart  0-5 Units Subcutaneous QHS   insulin aspart  0-9 Units Subcutaneous TID WC   metoprolol succinate  25 mg Oral Daily   pantoprazole  40 mg Oral Daily   pravastatin   10 mg Oral QPM     Madelon Lips MD 11/30/2020, 10:47 AM

## 2020-12-01 DIAGNOSIS — R410 Disorientation, unspecified: Secondary | ICD-10-CM

## 2020-12-01 DIAGNOSIS — R531 Weakness: Secondary | ICD-10-CM

## 2020-12-01 LAB — BASIC METABOLIC PANEL
Anion gap: 8 (ref 5–15)
BUN: 12 mg/dL (ref 8–23)
CO2: 30 mmol/L (ref 22–32)
Calcium: 8.7 mg/dL — ABNORMAL LOW (ref 8.9–10.3)
Chloride: 85 mmol/L — ABNORMAL LOW (ref 98–111)
Creatinine, Ser: 1.03 mg/dL (ref 0.61–1.24)
GFR, Estimated: >60 mL/min (ref >60)
Glucose, Bld: 145 mg/dL — ABNORMAL HIGH (ref 70–99)
Potassium: 2.9 mmol/L — ABNORMAL LOW (ref 3.5–5.1)
Sodium: 123 mmol/L — ABNORMAL LOW (ref 135–145)

## 2020-12-01 LAB — GLUCOSE, CAPILLARY
Glucose-Capillary: 126 mg/dL — ABNORMAL HIGH (ref 70–99)
Glucose-Capillary: 207 mg/dL — ABNORMAL HIGH (ref 70–99)
Glucose-Capillary: 100 mg/dL — ABNORMAL HIGH (ref 70–99)
Glucose-Capillary: 164 mg/dL — ABNORMAL HIGH (ref 70–99)

## 2020-12-01 MED ORDER — FUROSEMIDE 40 MG PO TABS
60.0000 mg | ORAL_TABLET | Freq: Two times a day (BID) | ORAL | Status: DC
  Administered 2020-12-01 – 2020-12-03 (×4): 60 mg via ORAL
  Filled 2020-12-01 (×4): qty 1

## 2020-12-01 MED ORDER — POTASSIUM CHLORIDE CRYS ER 20 MEQ PO TBCR
40.0000 meq | EXTENDED_RELEASE_TABLET | Freq: Three times a day (TID) | ORAL | Status: AC
  Administered 2020-12-01 – 2020-12-03 (×6): 40 meq via ORAL
  Filled 2020-12-01 (×6): qty 2

## 2020-12-01 MED ORDER — MAGNESIUM SULFATE 4 GM/100ML IV SOLN
4.0000 g | Freq: Once | INTRAVENOUS | Status: AC
  Administered 2020-12-01: 4 g via INTRAVENOUS
  Filled 2020-12-01: qty 100

## 2020-12-01 NOTE — Progress Notes (Addendum)
Met with pt's daughter Donald Calderon). She is concern about her father returning home. She reports that he was able to get OOB prior to this admission. Her stepmother walks with a walker and she is not able to help him. She reports that she has 3 brothers, but they work. She is the only one that can assist him, but she works and she can't provide 24 hr care. She reports that he is very stubborn and he doesn't want HH or SNF. He doesn't want people coming to his house. She reports that his PCP arranged HHPT and the therapist only came 1 time because he told the therapist he didn't need him. Informed [name] that we will continue to f/u to assist with the D/C plan and we can assist with SNF if he changes his mind.

## 2020-12-01 NOTE — Progress Notes (Signed)
Donald Calderon  DGL:[id number] DOB: [birth year] DOA: 11/28/2020 PCP: Medicine, [facility]    Brief Narrative:  80 year old with a history of HTN, DM2, diabetic neuropathy, and BPH who presented with significant confusion and was found to be severely hyponatremic at 117.  Consultants:  Nephrology  Code Status: FULL CODE  Antimicrobials:  Rocephin  DVT prophylaxis: Lovenox  Subjective: Afebrile.  Vital signs stable.  Saturation 98% on room air.  Has no complaints at present.  Is anxious to go home soon as possible.  Denies chest pain shortness of breath fevers chills nausea vomiting.  Reports good oral intake.  Assessment & Plan:  Severe hyponatremia Hypervolemic -sodium 117 at presentation and patient was clearly volume overloaded -improved initially but with diuresis -goal is sodium of approximately 130 - increase diuretic today and continue to follow  Recent Labs  Lab 11/29/20 1620 11/29/20 2102 11/30/20 0234 11/30/20 0543 12/01/20 1338  NA 124* 126* 126* 126* 123*    Acute metabolic encephalopathy Due to severe hyponatremia -resolved for now despite fluctuating sodium -monitor in serial fashion  Hypokalemia Supplement and follow -likely due to diuretic  Hypomagnesemia Supplement and follow -likely due to diuretic  Presumptive UTI POA Has been treated with ceftriaxone -presently asymptomatic  Chronic volume overload with bilateral lower extremity edema EF 60-65% on TTE -likely venous insufficiency -much improved with ongoing diuresis  Essential hypertension Continue usual home blood pressure meds  DM2 uncontrolled with neuropathy CBG well controlled as an inpatient  BPH Continue usual home medications    Family Communication: No family present at time of exam Status is: Inpatient  Remains inpatient appropriate because:Inpatient level of care appropriate due to severity of illness  Dispo:  Patient From: Home  Planned Disposition:  Home  Medically stable for discharge: No           Objective: Blood pressure (!) 139/93, pulse 87, temperature 98.9 F (37.2 C), temperature source Oral, resp. rate 18, height 5\' 10"  (1.778 m), weight 87.7 kg, SpO2 98 %.  Intake/Output Summary (Last 24 hours) at 12/01/2020 1821 Last data filed at 12/01/2020 1700 Gross per 24 hour  Intake 780 ml  Output 1100 ml  Net -320 ml   Filed Weights   11/29/20 0151 11/30/20 0500 12/01/20 0545  Weight: 85.1 kg 85.1 kg 87.7 kg    Examination: General: No acute respiratory distress Lungs: Clear to auscultation bilaterally without wheezes or crackles Cardiovascular: Regular rate and rhythm without murmur gallop or rub normal S1 and S2 Abdomen: Nontender, nondistended, soft, bowel sounds positive, no rebound, no ascites, no appreciable mass Extremities: No significant cyanosis, clubbing, or edema bilateral lower extremities  CBC: Recent Labs  Lab 11/28/20 1751 11/30/20 0543  WBC 5.8 6.5  NEUTROABS  --  4.6  HGB 13.3 13.4  HCT 38.5* 38.5*  MCV 89.3 89.1  PLT 264 518   Basic Metabolic Panel: Recent Labs  Lab 11/30/20 0234 11/30/20 0543 12/01/20 1338  NA 126* 126* 123*  K 3.3* 3.1* 2.9*  CL 87* 89* 85*  CO2 30 28 30   GLUCOSE 138* 122* 145*  BUN 8 8 12   CREATININE 1.10 1.07 1.03  CALCIUM 9.2 8.9 8.7*  MG  --  1.4*  --    GFR: Estimated Creatinine Clearance: 64.9 mL/min (by C-G formula based on SCr of 1.03 mg/dL).  Liver Function Tests: Recent Labs  Lab 11/30/20 0543  AST 17  ALT 11  ALKPHOS 43  BILITOT 0.8  PROT 5.8*  ALBUMIN 3.0*  HbA1C: Hgb A1c MFr Bld  Date/Time Value Ref Range Status  11/29/2020 12:32 AM 5.8 (H) 4.8 - 5.6 % Final    Comment:    (NOTE)         Prediabetes: 5.7 - 6.4         Diabetes: >6.4         Glycemic control for adults with diabetes: <7.0   05/11/2019 06:13 PM 5.8 (H) 4.8 - 5.6 % Final    Comment:    (NOTE) Pre diabetes:          5.7%-6.4% Diabetes:               >6.4% Glycemic control for   <7.0% adults with diabetes     CBG: Recent Labs  Lab 11/30/20 1813 11/30/20 2116 12/01/20 0838 12/01/20 1242 12/01/20 1642  GLUCAP 164* 101* 126* 207* 100*    Recent Results (from the past 240 hour(s))  Resp Panel by RT-PCR (Flu A&B, Covid) Nasopharyngeal Swab     Status: None   Collection Time: 11/28/20 10:25 PM   Specimen: Nasopharyngeal Swab; Nasopharyngeal(NP) swabs in vial transport medium  Result Value Ref Range Status   SARS Coronavirus 2 by RT PCR NEGATIVE NEGATIVE Final    Comment: (NOTE) SARS-CoV-2 target nucleic acids are NOT DETECTED.  The SARS-CoV-2 RNA is generally detectable in upper respiratory specimens during the acute phase of infection. The lowest concentration of SARS-CoV-2 viral copies this assay can detect is 138 copies/mL. A negative result does not preclude SARS-Cov-2 infection and should not be used as the sole basis for treatment or other patient management decisions. A negative result may occur with  improper specimen collection/handling, submission of specimen other than nasopharyngeal swab, presence of viral mutation(s) within the areas targeted by this assay, and inadequate number of viral copies(<138 copies/mL). A negative result must be combined with clinical observations, patient history, and epidemiological information. The expected result is Negative.  Fact Sheet for Patients:  EntrepreneurPulse.com.au  Fact Sheet for Healthcare Providers:  IncredibleEmployment.be  This test is no t yet approved or cleared by the [place] FDA and  has been authorized for detection and/or diagnosis of SARS-CoV-2 by FDA under an Emergency Use Authorization (EUA). This EUA will remain  in effect (meaning this test can be used) for the duration of the COVID-19 declaration under Section 564(b)(1) of the Act, 21 U.S.C.section 360bbb-3(b)(1), unless the authorization is terminated  or  revoked sooner.       Influenza A by PCR NEGATIVE NEGATIVE Final   Influenza B by PCR NEGATIVE NEGATIVE Final    Comment: (NOTE) The Xpert Xpress SARS-CoV-2/FLU/RSV plus assay is intended as an aid in the diagnosis of influenza from Nasopharyngeal swab specimens and should not be used as a sole basis for treatment. Nasal washings and aspirates are unacceptable for Xpert Xpress SARS-CoV-2/FLU/RSV testing.  Fact Sheet for Patients: EntrepreneurPulse.com.au  Fact Sheet for Healthcare Providers: IncredibleEmployment.be  This test is not yet approved or cleared by the [place] FDA and has been authorized for detection and/or diagnosis of SARS-CoV-2 by FDA under an Emergency Use Authorization (EUA). This EUA will remain in effect (meaning this test can be used) for the duration of the COVID-19 declaration under Section 564(b)(1) of the Act, 21 U.S.C. section 360bbb-3(b)(1), unless the authorization is terminated or revoked.  Performed at [facility], [place] [address] [place], [place] [address]   Culture, Urine     Status: Abnormal   Collection Time: 11/28/20 11:54  PM   Specimen: Urine, Random  Result Value Ref Range Status   Specimen Description URINE, RANDOM  Final   Special Requests   Final    NONE Performed at [facility], [address] [place], [place] [address]    Culture MULTIPLE SPECIES PRESENT, SUGGEST RECOLLECTION (A)  Final   Report Status 11/30/2020 FINAL  Final     Scheduled Meds:  amLODipine  10 mg Oral Daily   enoxaparin (LOVENOX) injection  40 mg Subcutaneous Q24H   furosemide  60 mg Oral BID   gabapentin  600 mg Oral QHS   insulin aspart  0-5 Units Subcutaneous QHS   insulin aspart  0-9 Units Subcutaneous TID WC   metoprolol succinate  25 mg Oral Daily   pantoprazole  40 mg Oral Daily   potassium chloride  40 mEq Oral TID   pravastatin  10 mg Oral QPM   Continuous Infusions:  cefTRIAXone  (ROCEPHIN)  IV 2 g (11/30/20 2129)     LOS: 3 days   Cherene Altes, MD [facility] Office  [phone] Pager - Text Page per Shea Evans  If 7PM-7AM, please contact night-coverage per Amion 12/01/2020, 6:21 PM

## 2020-12-01 NOTE — TOC Initial Note (Addendum)
Transition of Care [facility]) - Initial/Assessment Note    Patient Details  Name: Donald Calderon MRN: [id number] Date of Birth: April 10, 1941  Transition of Care [facility]) CM/SW Contact:    Donald Buzzard, RN Phone Number: 12/01/2020, 2:34 PM  Clinical Narrative: 80 yo M admitted with onset of confusion and mood change, has UTI, acute metabolic encephalopathy, and LE edema.            PT/OT are recommending SNF vs HH and DME (3-in-1 BSC and RW).  Met with pt to discuss D/C plan. Pt declined SNF. He plans to return home with the support of his wife and daughter who visits him every day and helps him with his meds. He report that he has a shower chair, BSC, rollator, RW, and a W/C. Discussed HH PT/OT. He reports that he was receiving HHPT. He doesn't know the name of the [facility] agency. Pt gave me permission to call his daughter Donald Calderon) to discuss the name of the [facility] agency.  Contacted Donald Calderon at [phone]. Discussed PT recommendations for SNF. Informed Donald Calderon that her father is not able to get OOB without assistance. She declined SNF. She reports that her niece has been assisting her father in the mornings and she assists him in the afternoons. She reports that her father's PCP is in the process of getting him some [place] assistance (she is not sure what kind of [facility] assistance) and he is not active with a [place] agency. Explained that PT/OT recommended HHPT and OT. She agrees with [facility] services. She reports that she doesn't have a preference for a [place] agency. Contacted Donald Calderon with [facility] since they have a contract with [facility] and he accepted the referral. Donald Calderon is agreeable with [facility]. Donald Calderon confirmed that pt has DME. Pt needs an order for [facility] PT/OT and F2F.  Will continue to f/u to assist with the D/C plan.      Expected Discharge Plan: Home/Self Care Barriers to Discharge: Continued Medical Work up   Patient Goals and CMS Choice        Expected Discharge Plan and Services Expected  Discharge Plan: Home/Self Care       Living arrangements for the past 2 months: Single Family Home                                      Prior Living Arrangements/Services Living arrangements for the past 2 months: Single Family Home Lives with:: Spouse Patient language and need for interpreter reviewed:: Yes        Need for Family Participation in Patient Care: Yes (Comment) Care giver support system in place?: Yes (comment)   Criminal Activity/Legal Involvement Pertinent to Current Situation/Hospitalization: No - Comment as needed  Activities of Daily Living Home Assistive Devices/Equipment: Environmental consultant (specify type) (Rollator) ADL Screening (condition at time of admission) Patient's cognitive ability adequate to safely complete daily activities?: Yes Is the patient deaf or have difficulty hearing?: No Does the patient have difficulty seeing, even when wearing glasses/contacts?: Yes Does the patient have difficulty concentrating, remembering, or making decisions?: No Patient able to express need for assistance with ADLs?: Yes Does the patient have difficulty dressing or bathing?: No Independently performs ADLs?: No Communication: Independent Dressing (OT): Needs assistance Is this a change from baseline?: Change from baseline, expected to last <3days Grooming: Appropriate for developmental age Feeding: Independent Bathing: Needs assistance Is this a change from  baseline?: Change from baseline, expected to last <3 days Toileting: Needs assistance Is this a change from baseline?: Pre-admission baseline In/Out Bed: Needs assistance Walks in Home: Independent with device (comment) Does the patient have difficulty walking or climbing stairs?: Yes Weakness of Legs: Both Weakness of Arms/Hands: None  Permission Sought/Granted                  Emotional Assessment       Orientation: : Oriented to Self, Oriented to Place, Oriented to  Time, Oriented to  Situation Alcohol / Substance Use: Not Applicable Psych Involvement: No (comment)  Admission diagnosis:  Confusion [R41.0] Hyponatremia [E87.1] Generalized weakness [R53.1] Patient Active Problem List   Diagnosis Date Noted   Sepsis due to undetermined organism ([place]) 05/11/2019   Hyperkalemia 05/11/2019   Hyperbilirubinemia 05/11/2019   Pressure injury of skin 03/10/2017   E coli UTI (urinary tract infection) 16/03/9603   Acute metabolic encephalopathy 54/02/8118   Encephalopathy 03/07/2017   Normocytic anemia 02/23/2017   Neurogenic bladder 02/23/2017   AKI (acute kidney injury) ([place]) 02/23/2017   Sepsis secondary to UTI ([place]) 02/22/2017   Postoperative ileus ([place]) 02/12/2017   Urine retention 01/29/2017   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   Lumbar stenosis 01/26/2017   Diabetes mellitus type 2 in obese ([place])    Hypertension associated with diabetes ([place])    ETOH abuse    Chronic pain syndrome    Hyponatremia    Leukocytosis    Acute blood loss anemia    Epidural hematoma ([place]) 01/23/2017   Status post lumbar surgery 01/21/2017   Status post lumbar spine operation 01/21/2017   Chronic back pain 09/27/2011   NEUROPATHY 08/08/2010   SWELLING MASS OR LUMP IN HEAD AND NECK 06/27/2010   BENIGN PROSTATIC HYPERTROPHY, WITH URINARY OBSTRUCTION 06/19/2010   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 [place] [address] Phone: [phone] Fax: [place], [place] [address] Phone: [phone] Fax: [phone]  [id number] - [place], [place] [address] Phone: [phone] Fax: [phone]     Social Determinants of Health (SDOH) Interventions    Readmission Risk Interventions No flowsheet data found.

## 2020-12-02 LAB — BASIC METABOLIC PANEL
Anion gap: 8 (ref 5–15)
BUN: 13 mg/dL (ref 8–23)
CO2: 31 mmol/L (ref 22–32)
Calcium: 8.8 mg/dL — ABNORMAL LOW (ref 8.9–10.3)
Chloride: 87 mmol/L — ABNORMAL LOW (ref 98–111)
Creatinine, Ser: 1.24 mg/dL (ref 0.61–1.24)
GFR, Estimated: 59 mL/min — ABNORMAL LOW (ref >60)
Glucose, Bld: 167 mg/dL — ABNORMAL HIGH (ref 70–99)
Potassium: 3.6 mmol/L (ref 3.5–5.1)
Sodium: 126 mmol/L — ABNORMAL LOW (ref 135–145)

## 2020-12-02 LAB — GLUCOSE, CAPILLARY
Glucose-Capillary: 133 mg/dL — ABNORMAL HIGH (ref 70–99)
Glucose-Capillary: 140 mg/dL — ABNORMAL HIGH (ref 70–99)
Glucose-Capillary: 145 mg/dL — ABNORMAL HIGH (ref 70–99)
Glucose-Capillary: 170 mg/dL — ABNORMAL HIGH (ref 70–99)

## 2020-12-02 LAB — MAGNESIUM: Magnesium: 2.7 mg/dL — ABNORMAL HIGH (ref 1.7–2.4)

## 2020-12-02 NOTE — Care Management (Addendum)
Received message that patient already has home health. Per note from [facility] yesterday , daughter stated PCP arranged HHPT and PT went one time and patient told them he did not need HHPT.   NCM went to speak to patient. Patient confirmed he does not want SNF. Patient does state that he already has home health. NCM asked for [facility] agency name , so NCM can give updated orders and information. Patient became upset and stated " it's no concern of ours" and asked NCM to leave.

## 2020-12-02 NOTE — Progress Notes (Signed)
PROGRESS NOTE    Donald Calderon  ERX:[id number] DOB: 01-Jun-1941 DOA: 11/28/2020 PCP: [facility], [facility]     Brief Narrative:  This 80 years old male with history of hypertension, type 2 diabetes, diabetic neuropathy, BPH presented in the ED with significant confusion and was found to have low sodium 117.  Patient is admitted for severe hyponatremia.  Nephrology is consulted.  Sodium is improving with current management.  Assessment & Plan:   Active Problems:   Hyponatremia   Severe hyponatremia: >Improving. Patient presented with acute confusional state, found to have sodium 117. Patient was clearly hypervolemic, He has improved initially with diuresis. Serum sodium improving 123> 950   Acute metabolic encephalopathy > Resolved. Due to severe hyponatremia -resolved for now despite fluctuating sodium -monitor in serial fashion.   Hypokalemia Resolved, continue to monitor   Hypomagnesemia Resolved, continue to monitor   UTI: POA Continue ceftriaxone.  Follow-up urine culture.   Chronic volume overload with bilateral lower extremity edema LVEF 60-65% on TTE -likely venous insufficiency -much improved with ongoing diuresis.   Essential hypertension Continue Home blood pressure meds.   DM2 uncontrolled with neuropathy CBG well controlled as an inpatient   BPH Continue usual home medications.   DVT prophylaxis: Lovenox Code Status: Full code Family Communication: No family at bedside Disposition Plan:    Status is: Inpatient  Remains inpatient appropriate because:Inpatient level of care appropriate due to severity of illness  Dispo:  Patient From: Home  Planned Disposition: Home  Medically stable for discharge: No  Consultants:  Nephrology  Procedures:  None Antimicrobials:   Anti-infectives (From admission, onward)    Start     Dose/Rate Route Frequency Ordered Stop   11/28/20 2145  cefTRIAXone (ROCEPHIN) 2 g in sodium chloride 0.9 %  100 mL IVPB        2 g 200 mL/hr over 30 Minutes Intravenous Every 24 hours 11/28/20 2140 12/03/20 2144   11/28/20 2130  ciprofloxacin (CIPRO) tablet 500 mg  Status:  Discontinued        500 mg Oral 2 times daily 11/28/20 2129 11/28/20 2140        Subjective: Patient was seen and examined at bedside.  Overnight events noted.  Patient reports feeling improved.  Objective: Vitals:   12/01/20 2146 12/02/20 0424 12/02/20 0946 12/02/20 1429  BP: 138/73 125/70 (!) 143/77 132/69  Pulse: 71 66 69 65  Resp: 16 16  16   Temp: 98 F (36.7 C) 97.9 F (36.6 C)  98.1 F (36.7 C)  TempSrc:  Oral  Oral  SpO2: 100% 98%  100%  Weight:  84.9 kg    Height:        Intake/Output Summary (Last 24 hours) at 12/02/2020 1451 Last data filed at 12/02/2020 1300 Gross per 24 hour  Intake 1078.92 ml  Output 1700 ml  Net -621.08 ml   Filed Weights   11/30/20 0500 12/01/20 0545 12/02/20 0424  Weight: 85.1 kg 87.7 kg 84.9 kg    Examination:  General exam: Appears calm and comfortable, not in any acute distress. Respiratory system: Clear to auscultation. Respiratory effort normal. Cardiovascular system: S1 & S2 heard, RRR. No JVD, murmurs, rubs, gallops or clicks. No pedal edema. Gastrointestinal system: Abdomen is nondistended, soft and nontender. No organomegaly or masses felt. Normal bowel sounds heard. Central nervous system: Alert and oriented. No focal neurological deficits. Extremities: Symmetric 5 x 5 power.  No edema, no cyanosis, no clubbing. Skin: No rashes, lesions or ulcers  Psychiatry: Judgement and insight appear normal. Mood & affect appropriate.     Data Reviewed: I have personally reviewed following labs and imaging studies  CBC: Recent Labs  Lab 11/28/20 1751 11/30/20 0543  WBC 5.8 6.5  NEUTROABS  --  4.6  HGB 13.3 13.4  HCT 38.5* 38.5*  MCV 89.3 89.1  PLT 264 604   Basic Metabolic Panel: Recent Labs  Lab 11/29/20 2102 11/30/20 0234 11/30/20 0543 12/01/20 1338  12/02/20 0015  NA 126* 126* 126* 123* 126*  K 3.2* 3.3* 3.1* 2.9* 3.6  CL 87* 87* 89* 85* 87*  CO2 31 30 28 30 31   GLUCOSE 131* 138* 122* 145* 167*  BUN 7* 8 8 12 13   CREATININE 1.04 1.10 1.07 1.03 1.24  CALCIUM 9.1 9.2 8.9 8.7* 8.8*  MG  --   --  1.4*  --  2.7*   GFR: Estimated Creatinine Clearance: 49.9 mL/min (by C-G formula based on SCr of 1.24 mg/dL). Liver Function Tests: Recent Labs  Lab 11/30/20 0543  AST 17  ALT 11  ALKPHOS 43  BILITOT 0.8  PROT 5.8*  ALBUMIN 3.0*   No results for input(s): LIPASE, AMYLASE in the last 168 hours. No results for input(s): AMMONIA in the last 168 hours. Coagulation Profile: No results for input(s): INR, PROTIME in the last 168 hours. Cardiac Enzymes: No results for input(s): CKTOTAL, CKMB, CKMBINDEX, TROPONINI in the last 168 hours. BNP (last 3 results) No results for input(s): PROBNP in the last 8760 hours. HbA1C: No results for input(s): HGBA1C in the last 72 hours. CBG: Recent Labs  Lab 12/01/20 1242 12/01/20 1642 12/01/20 2102 12/02/20 0754 12/02/20 1146  GLUCAP 207* 100* 164* 133* 140*   Lipid Profile: No results for input(s): CHOL, HDL, LDLCALC, TRIG, CHOLHDL, LDLDIRECT in the last 72 hours. Thyroid Function Tests: No results for input(s): TSH, T4TOTAL, FREET4, T3FREE, THYROIDAB in the last 72 hours. Anemia Panel: No results for input(s): VITAMINB12, FOLATE, FERRITIN, TIBC, IRON, RETICCTPCT in the last 72 hours. Sepsis Labs: No results for input(s): PROCALCITON, LATICACIDVEN in the last 168 hours.  Recent Results (from the past 240 hour(s))  Resp Panel by RT-PCR (Flu A&B, Covid) Nasopharyngeal Swab     Status: None   Collection Time: 11/28/20 10:25 PM   Specimen: Nasopharyngeal Swab; Nasopharyngeal(NP) swabs in vial transport medium  Result Value Ref Range Status   SARS Coronavirus 2 by RT PCR NEGATIVE NEGATIVE Final    Comment: (NOTE) SARS-CoV-2 target nucleic acids are NOT DETECTED.  The SARS-CoV-2 RNA is  generally detectable in upper respiratory specimens during the acute phase of infection. The lowest concentration of SARS-CoV-2 viral copies this assay can detect is 138 copies/mL. A negative result does not preclude SARS-Cov-2 infection and should not be used as the sole basis for treatment or other patient management decisions. A negative result may occur with  improper specimen collection/handling, submission of specimen other than nasopharyngeal swab, presence of viral mutation(s) within the areas targeted by this assay, and inadequate number of viral copies(<138 copies/mL). A negative result must be combined with clinical observations, patient history, and epidemiological information. The expected result is Negative.  Fact Sheet for Patients:  EntrepreneurPulse.com.au  Fact Sheet for Healthcare Providers:  IncredibleEmployment.be  This test is no t yet approved or cleared by the [place] FDA and  has been authorized for detection and/or diagnosis of SARS-CoV-2 by FDA under an Emergency Use Authorization (EUA). This EUA will remain  in effect (meaning this test can be  used) for the duration of the COVID-19 declaration under Section 564(b)(1) of the Act, 21 U.S.C.section 360bbb-3(b)(1), unless the authorization is terminated  or revoked sooner.       Influenza A by PCR NEGATIVE NEGATIVE Final   Influenza B by PCR NEGATIVE NEGATIVE Final    Comment: (NOTE) The Xpert Xpress SARS-CoV-2/FLU/RSV plus assay is intended as an aid in the diagnosis of influenza from Nasopharyngeal swab specimens and should not be used as a sole basis for treatment. Nasal washings and aspirates are unacceptable for Xpert Xpress SARS-CoV-2/FLU/RSV testing.  Fact Sheet for Patients: EntrepreneurPulse.com.au  Fact Sheet for Healthcare Providers: IncredibleEmployment.be  This test is not yet approved or cleared by the [place] FDA and has been authorized for detection and/or diagnosis of SARS-CoV-2 by FDA under an Emergency Use Authorization (EUA). This EUA will remain in effect (meaning this test can be used) for the duration of the COVID-19 declaration under Section 564(b)(1) of the Act, 21 U.S.C. section 360bbb-3(b)(1), unless the authorization is terminated or revoked.  Performed at [facility], [place] [address], [place], [place] [address]   Culture, Urine     Status: Abnormal   Collection Time: 11/28/20 11:54 PM   Specimen: Urine, Random  Result Value Ref Range Status   Specimen Description URINE, RANDOM  Final   Special Requests   Final    NONE Performed at [facility], [place] [address] [place], [place] [address]    Culture MULTIPLE SPECIES PRESENT, SUGGEST RECOLLECTION (A)  Final   Report Status 11/30/2020 FINAL  Final    Radiology Studies: No results found.  Scheduled Meds:  amLODipine  10 mg Oral Daily   enoxaparin (LOVENOX) injection  40 mg Subcutaneous Q24H   furosemide  60 mg Oral BID   gabapentin  600 mg Oral QHS   insulin aspart  0-5 Units Subcutaneous QHS   insulin aspart  0-9 Units Subcutaneous TID WC   metoprolol succinate  25 mg Oral Daily   pantoprazole  40 mg Oral Daily   potassium chloride  40 mEq Oral TID   pravastatin  10 mg Oral QPM   Continuous Infusions:  cefTRIAXone (ROCEPHIN)  IV Stopped (12/01/20 2146)     LOS: 4 days    Time spent: 35 mins    Nohely Whitehorn, MD [facility]   If 7PM-7AM, please contact night-coverage

## 2020-12-02 NOTE — Progress Notes (Addendum)
Physical Therapy Treatment Patient Details Name: Donald Calderon MRN: [id number] DOB: 1940-09-05 Today's Date: 12/02/2020    History of Present Illness 80 yo male with onset of confusion and mood change was brought to hospital and found to have UTI, hyponatremia, LE edema.  PMHx:  EtOH abuse, DM, polyneuropathy, HTN, PNA.    PT Comments    Pt progressing well towards his physical therapy goals; agreeable to participate with encouragement. Requiring min assist for transfers, ambulating x 300 feet with a Rollator at a min guard assist level. Will continue to follow acutely to progress mobility as tolerated.     Follow Up Recommendations  No PT follow up;Supervision for mobility/OOB (Pt refusing HHPT)     Equipment Recommendations  None recommended by PT    Recommendations for Other Services       Precautions / Restrictions Precautions Precautions: Fall;Other (comment) Precaution Comments: blind Restrictions Weight Bearing Restrictions: No    Mobility  Bed Mobility Overal bed mobility: Modified Independent             General bed mobility comments: Increased time/effort, no physical assist required    Transfers Overall transfer level: Needs assistance Equipment used: 4-wheeled walker Transfers: Sit to/from Stand Sit to Stand: Min assist         General transfer comment: MinA to boost up to stand. Pt reports bed is higher at home  Ambulation/Gait Ambulation/Gait assistance: Min guard Gait Distance (Feet): 300 Feet Assistive device: 4-wheeled walker Gait Pattern/deviations: Decreased stride length;Step-through pattern;Decreased dorsiflexion - right;Decreased dorsiflexion - left;Narrow base of support Gait velocity: reduced   General Gait Details: Narrow BOS, slow speed, no gross instability. Min guard for Barrister's clerk    Modified Rankin (Stroke Patients Only)       Balance Overall balance assessment: Needs  assistance Sitting-balance support: Feet supported Sitting balance-Leahy Scale: Good     Standing balance support: Bilateral upper extremity supported;During functional activity Standing balance-Leahy Scale: Poor                              Cognition Arousal/Alertness: Awake/alert Behavior During Therapy: WFL for tasks assessed/performed Overall Cognitive Status: Within Functional Limits for tasks assessed                                        Exercises      General Comments        Pertinent Vitals/Pain Pain Assessment: No/denies pain    Home Living                      Prior Function            PT Goals (current goals can now be found in the care plan section) Acute Rehab PT Goals Patient Stated Goal: go home PT Goal Formulation: With patient Time For Goal Achievement: 12/13/20 Potential to Achieve Goals: Good Progress towards PT goals: Progressing toward goals    Frequency    Min 3X/week      PT Plan Discharge plan needs to be updated    Co-evaluation              AM-PAC PT "6 Clicks" Mobility   Outcome Measure  Help needed turning from your back to your side  while in a flat bed without using bedrails?: None Help needed moving from lying on your back to sitting on the side of a flat bed without using bedrails?: None Help needed moving to and from a bed to a chair (including a wheelchair)?: A Little Help needed standing up from a chair using your arms (e.g., wheelchair or bedside chair)?: A Little Help needed to walk in hospital room?: A Little Help needed climbing 3-5 steps with a railing? : A Lot 6 Click Score: 19    End of Session   Activity Tolerance: Patient tolerated treatment well;Patient limited by fatigue Patient left: with call bell/phone within reach;in bed;with bed alarm set Nurse Communication: Mobility status PT Visit Diagnosis: Unsteadiness on feet (R26.81);Muscle weakness (generalized)  (M62.81);Difficulty in walking, not elsewhere classified (R26.2)     Time: 8264-1583 PT Time Calculation (min) (ACUTE ONLY): 31 min  Charges:  $Therapeutic Activity: 23-37 mins                     Wyona Almas, PT, DPT Acute Rehabilitation Services Pager [phone] Office [phone]    Deno Etienne 12/02/2020, 5:19 PM

## 2020-12-02 NOTE — Plan of Care (Signed)

## 2020-12-02 NOTE — Progress Notes (Signed)
Patient refusing to divulge the name of his [facility] agency so that CM can verify that his services. Patient's [facility] nurse called him while this nurse was in the room. I asked what company he was with and he said [employer]. I explained to the patient that we just want to check to make sure that they know he was in the hospital and make sure that he can get PT at home with them. Patient states he can ask them for more therapies if he needs them. RN on the phone plans to see patient on Thursday.

## 2020-12-02 NOTE — Progress Notes (Signed)
PT Cancellation Note  Patient Details Name: AMARE BAIL MRN: [id number] DOB: 01-02-1941   Cancelled Treatment:    Reason Eval/Treat Not Completed: (P) Patient declined, no reason specified (per OT report, pt refusing PT/OT therapies this date, will plan to re-assess next date.) Will continue efforts next date per PT POC as schedule permits.   Kara Pacer Yavier Snider 12/02/2020, 1:39 PM

## 2020-12-02 NOTE — Progress Notes (Signed)
OT Cancellation Note  Patient Details Name: SYLVANUS TELFORD MRN: [id number] DOB: 24-Sep-1940   Cancelled Treatment:    Reason Eval/Treat Not Completed: Patient declined, no reason specified;Other (comment) (Pt refusing any further therapy). Pt stated, "you're wasting your time cause I don't want any therapy". RN notified. OT will sign off, re order as needed/pt changes his mind.  Emmit Alexanders [facility] 12/02/2020, 12:07 PM

## 2020-12-03 LAB — PHOSPHORUS: Phosphorus: 2.8 mg/dL (ref 2.5–4.6)

## 2020-12-03 LAB — CBC
HCT: 35.6 % — ABNORMAL LOW (ref 39.0–52.0)
Hemoglobin: 12.3 g/dL — ABNORMAL LOW (ref 13.0–17.0)
MCH: 31.1 pg (ref 26.0–34.0)
MCHC: 34.6 g/dL (ref 30.0–36.0)
MCV: 90.1 fL (ref 80.0–100.0)
Platelets: 227 10*3/uL (ref 150–400)
RBC: 3.95 MIL/uL — ABNORMAL LOW (ref 4.22–5.81)
RDW: 12.7 % (ref 11.5–15.5)
WBC: 10.3 10*3/uL (ref 4.0–10.5)
nRBC: 0 % (ref 0.0–0.2)

## 2020-12-03 LAB — BASIC METABOLIC PANEL
Anion gap: 8 (ref 5–15)
BUN: 20 mg/dL (ref 8–23)
CO2: 27 mmol/L (ref 22–32)
Calcium: 8.6 mg/dL — ABNORMAL LOW (ref 8.9–10.3)
Chloride: 89 mmol/L — ABNORMAL LOW (ref 98–111)
Creatinine, Ser: 1.4 mg/dL — ABNORMAL HIGH (ref 0.61–1.24)
GFR, Estimated: 51 mL/min — ABNORMAL LOW (ref >60)
Glucose, Bld: 170 mg/dL — ABNORMAL HIGH (ref 70–99)
Potassium: 3.9 mmol/L (ref 3.5–5.1)
Sodium: 124 mmol/L — ABNORMAL LOW (ref 135–145)

## 2020-12-03 LAB — GLUCOSE, CAPILLARY
Glucose-Capillary: 163 mg/dL — ABNORMAL HIGH (ref 70–99)
Glucose-Capillary: 146 mg/dL — ABNORMAL HIGH (ref 70–99)
Glucose-Capillary: 194 mg/dL — ABNORMAL HIGH (ref 70–99)
Glucose-Capillary: 111 mg/dL — ABNORMAL HIGH (ref 70–99)

## 2020-12-03 LAB — MAGNESIUM: Magnesium: 2 mg/dL (ref 1.7–2.4)

## 2020-12-03 MED ORDER — FUROSEMIDE 40 MG PO TABS
40.0000 mg | ORAL_TABLET | Freq: Two times a day (BID) | ORAL | Status: DC
  Administered 2020-12-03 – 2020-12-05 (×4): 40 mg via ORAL
  Filled 2020-12-03 (×4): qty 1

## 2020-12-03 NOTE — Progress Notes (Signed)
PROGRESS NOTE    Donald Calderon  [id number]:[id number] DOB: 1940/07/14 DOA: 11/28/2020 PCP: Medicine, [facility]     Brief Narrative:  This 80 years old male with history of hypertension, type 2 diabetes, diabetic neuropathy, BPH presented in the ED with significant confusion and was found to have low sodium 117.  Patient is admitted for severe hyponatremia.  Nephrology is consulted.  Sodium is improving with current management.  Assessment & Plan:   Active Problems:   Hyponatremia   Severe hyponatremia: >Improving. Patient presented with acute confusional state, found to have sodium 117. Patient was clearly hypervolemic, He has improved initially with diuresis. Serum sodium improving 123> 126> 124 Nephrology recommended continue Lasix,  serial sodiums and fluid restriction. Patient can be discharged when sodium is around 130.   Acute metabolic encephalopathy > Resolved. Due to severe hyponatremia -resolved for now despite fluctuating sodium -monitor in serial fashion.   Hypokalemia Resolved, continue to monitor   Hypomagnesemia Resolved, continue to monitor   UTI: POA Continue ceftriaxone.  Urine culture contaminated.   Chronic volume overload with bilateral lower extremity edema LVEF 60-65% on TTE -likely venous insufficiency -much improved with ongoing diuresis.   Essential hypertension Continue Home blood pressure meds.   DM2 uncontrolled with neuropathy CBG well controlled as an inpatient   BPH Continue usual home medications.   DVT prophylaxis: Lovenox Code Status: Full code Family Communication: No family at bedside Disposition Plan:    Status is: Inpatient  Remains inpatient appropriate because:Inpatient level of care appropriate due to severity of illness  Dispo:  Patient From: Home  Planned Disposition: Home with Health Care Svc  Medically stable for discharge: No  Consultants:  Nephrology  Procedures:  None Antimicrobials:    Anti-infectives (From admission, onward)    Start     Dose/Rate Route Frequency Ordered Stop   11/28/20 2145  cefTRIAXone (ROCEPHIN) 2 g in sodium chloride 0.9 % 100 mL IVPB        2 g 200 mL/hr over 30 Minutes Intravenous Every 24 hours 11/28/20 2140 12/02/20 2116   11/28/20 2130  ciprofloxacin (CIPRO) tablet 500 mg  Status:  Discontinued        500 mg Oral 2 times daily 11/28/20 2129 11/28/20 2140        Subjective: Patient was seen and examined at bedside.  Overnight events noted.  Patient reports feeling improved.   Patient wants to be discharged home.  Objective: Vitals:   12/02/20 1429 12/02/20 2037 12/03/20 0446 12/03/20 1426  BP: 132/69 113/74 121/70 137/81  Pulse: 65 79 68 75  Resp: 16 16 16 16   Temp: 98.1 F (36.7 C) 99.3 F (37.4 C) (!) 97.5 F (36.4 C) 98.2 F (36.8 C)  TempSrc: Oral Oral Oral Oral  SpO2: 100% 100% 99% 100%  Weight:      Height:        Intake/Output Summary (Last 24 hours) at 12/03/2020 1551 Last data filed at 12/03/2020 1400 Gross per 24 hour  Intake 622 ml  Output 3300 ml  Net -2678 ml   Filed Weights   11/30/20 0500 12/01/20 0545 12/02/20 0424  Weight: 85.1 kg 87.7 kg 84.9 kg    Examination:  General exam: Appears calm and comfortable, not in any acute distress. Respiratory system: Clear to auscultation. Respiratory effort normal. Cardiovascular system: S1 & S2 heard, RRR. No JVD, murmurs, rubs, gallops or clicks. No pedal edema. Gastrointestinal system: Abdomen is nondistended, soft and nontender. No organomegaly or masses  felt. Normal bowel sounds heard. Central nervous system: Alert and oriented. No focal neurological deficits. Extremities: No edema, no cyanosis, no clubbing. Skin: No rashes, lesions or ulcers Psychiatry: Judgement and insight appear normal. Mood & affect appropriate.     Data Reviewed: I have personally reviewed following labs and imaging studies  CBC: Recent Labs  Lab 11/28/20 1751 11/30/20 0543  12/03/20 0106  WBC 5.8 6.5 10.3  NEUTROABS  --  4.6  --   HGB 13.3 13.4 12.3*  HCT 38.5* 38.5* 35.6*  MCV 89.3 89.1 90.1  PLT 264 240 539   Basic Metabolic Panel: Recent Labs  Lab 11/30/20 0234 11/30/20 0543 12/01/20 1338 12/02/20 0015 12/03/20 0106  NA 126* 126* 123* 126* 124*  K 3.3* 3.1* 2.9* 3.6 3.9  CL 87* 89* 85* 87* 89*  CO2 30 28 30 31 27   GLUCOSE 138* 122* 145* 167* 170*  BUN 8 8 12 13 20   CREATININE 1.10 1.07 1.03 1.24 1.40*  CALCIUM 9.2 8.9 8.7* 8.8* 8.6*  MG  --  1.4*  --  2.7* 2.0  PHOS  --   --   --   --  2.8   GFR: Estimated Creatinine Clearance: 44.2 mL/min (A) (by C-G formula based on SCr of 1.4 mg/dL (H)). Liver Function Tests: Recent Labs  Lab 11/30/20 0543  AST 17  ALT 11  ALKPHOS 43  BILITOT 0.8  PROT 5.8*  ALBUMIN 3.0*   No results for input(s): LIPASE, AMYLASE in the last 168 hours. No results for input(s): AMMONIA in the last 168 hours. Coagulation Profile: No results for input(s): INR, PROTIME in the last 168 hours. Cardiac Enzymes: No results for input(s): CKTOTAL, CKMB, CKMBINDEX, TROPONINI in the last 168 hours. BNP (last 3 results) No results for input(s): PROBNP in the last 8760 hours. HbA1C: No results for input(s): HGBA1C in the last 72 hours. CBG: Recent Labs  Lab 12/02/20 1146 12/02/20 1634 12/02/20 2035 12/03/20 0748 12/03/20 1210  GLUCAP 140* 145* 170* 163* 146*   Lipid Profile: No results for input(s): CHOL, HDL, LDLCALC, TRIG, CHOLHDL, LDLDIRECT in the last 72 hours. Thyroid Function Tests: No results for input(s): TSH, T4TOTAL, FREET4, T3FREE, THYROIDAB in the last 72 hours. Anemia Panel: No results for input(s): VITAMINB12, FOLATE, FERRITIN, TIBC, IRON, RETICCTPCT in the last 72 hours. Sepsis Labs: No results for input(s): PROCALCITON, LATICACIDVEN in the last 168 hours.  Recent Results (from the past 240 hour(s))  Resp Panel by RT-PCR (Flu A&B, Covid) Nasopharyngeal Swab     Status: None   Collection  Time: 11/28/20 10:25 PM   Specimen: Nasopharyngeal Swab; Nasopharyngeal(NP) swabs in vial transport medium  Result Value Ref Range Status   SARS Coronavirus 2 by RT PCR NEGATIVE NEGATIVE Final    Comment: (NOTE) SARS-CoV-2 target nucleic acids are NOT DETECTED.  The SARS-CoV-2 RNA is generally detectable in upper respiratory specimens during the acute phase of infection. The lowest concentration of SARS-CoV-2 viral copies this assay can detect is 138 copies/mL. A negative result does not preclude SARS-Cov-2 infection and should not be used as the sole basis for treatment or other patient management decisions. A negative result may occur with  improper specimen collection/handling, submission of specimen other than nasopharyngeal swab, presence of viral mutation(s) within the areas targeted by this assay, and inadequate number of viral copies(<138 copies/mL). A negative result must be combined with clinical observations, patient history, and epidemiological information. The expected result is Negative.  Fact Sheet for Patients:  EntrepreneurPulse.com.au  Fact Sheet for Healthcare Providers:  IncredibleEmployment.be  This test is no t yet approved or cleared by the [place] FDA and  has been authorized for detection and/or diagnosis of SARS-CoV-2 by FDA under an Emergency Use Authorization (EUA). This EUA will remain  in effect (meaning this test can be used) for the duration of the COVID-19 declaration under Section 564(b)(1) of the Act, 21 U.S.C.section 360bbb-3(b)(1), unless the authorization is terminated  or revoked sooner.       Influenza A by PCR NEGATIVE NEGATIVE Final   Influenza B by PCR NEGATIVE NEGATIVE Final    Comment: (NOTE) The Xpert Xpress SARS-CoV-2/FLU/RSV plus assay is intended as an aid in the diagnosis of influenza from Nasopharyngeal swab specimens and should not be used as a sole basis for treatment. Nasal washings  and aspirates are unacceptable for Xpert Xpress SARS-CoV-2/FLU/RSV testing.  Fact Sheet for Patients: EntrepreneurPulse.com.au  Fact Sheet for Healthcare Providers: IncredibleEmployment.be  This test is not yet approved or cleared by the [place] FDA and has been authorized for detection and/or diagnosis of SARS-CoV-2 by FDA under an Emergency Use Authorization (EUA). This EUA will remain in effect (meaning this test can be used) for the duration of the COVID-19 declaration under Section 564(b)(1) of the Act, 21 U.S.C. section 360bbb-3(b)(1), unless the authorization is terminated or revoked.  Performed at [facility], [place] [address] [place], [place] [address]   Culture, Urine     Status: Abnormal   Collection Time: 11/28/20 11:54 PM   Specimen: Urine, Random  Result Value Ref Range Status   Specimen Description URINE, RANDOM  Final   Special Requests   Final    NONE Performed at [facility], [place] [address] [place] [employer], [place] [address]    Culture MULTIPLE SPECIES PRESENT, SUGGEST RECOLLECTION (A)  Final   Report Status 11/30/2020 FINAL  Final    Radiology Studies: No results found.  Scheduled Meds:  amLODipine  10 mg Oral Daily   enoxaparin (LOVENOX) injection  40 mg Subcutaneous Q24H   furosemide  40 mg Oral BID   gabapentin  600 mg Oral QHS   insulin aspart  0-5 Units Subcutaneous QHS   insulin aspart  0-9 Units Subcutaneous TID WC   metoprolol succinate  25 mg Oral Daily   pantoprazole  40 mg Oral Daily   pravastatin  10 mg Oral QPM   Continuous Infusions:     LOS: 5 days    Time spent: 25 mins    Shawna Clamp, MD [facility]   If 7PM-7AM, please contact night-coverage

## 2020-12-03 NOTE — Care Management (Signed)
Donald Calderon with [facility]. NCM will enter resumption of care orders for [facility] and add HHPT.   Magdalen Spatz RN

## 2020-12-04 LAB — GLUCOSE, CAPILLARY
Glucose-Capillary: 182 mg/dL — ABNORMAL HIGH (ref 70–99)
Glucose-Capillary: 126 mg/dL — ABNORMAL HIGH (ref 70–99)
Glucose-Capillary: 213 mg/dL — ABNORMAL HIGH (ref 70–99)
Glucose-Capillary: 103 mg/dL — ABNORMAL HIGH (ref 70–99)

## 2020-12-04 LAB — CBC
HCT: 37.2 % — ABNORMAL LOW (ref 39.0–52.0)
Hemoglobin: 12.8 g/dL — ABNORMAL LOW (ref 13.0–17.0)
MCH: 31.3 pg (ref 26.0–34.0)
MCHC: 34.4 g/dL (ref 30.0–36.0)
MCV: 91 fL (ref 80.0–100.0)
Platelets: 230 10*3/uL (ref 150–400)
RBC: 4.09 MIL/uL — ABNORMAL LOW (ref 4.22–5.81)
RDW: 12.7 % (ref 11.5–15.5)
WBC: 7.8 10*3/uL (ref 4.0–10.5)
nRBC: 0 % (ref 0.0–0.2)

## 2020-12-04 LAB — BASIC METABOLIC PANEL
Anion gap: 10 (ref 5–15)
BUN: 22 mg/dL (ref 8–23)
CO2: 27 mmol/L (ref 22–32)
Calcium: 9.2 mg/dL (ref 8.9–10.3)
Chloride: 89 mmol/L — ABNORMAL LOW (ref 98–111)
Creatinine, Ser: 1.26 mg/dL — ABNORMAL HIGH (ref 0.61–1.24)
GFR, Estimated: 58 mL/min — ABNORMAL LOW (ref >60)
Glucose, Bld: 137 mg/dL — ABNORMAL HIGH (ref 70–99)
Potassium: 4.2 mmol/L (ref 3.5–5.1)
Sodium: 126 mmol/L — ABNORMAL LOW (ref 135–145)

## 2020-12-04 LAB — MAGNESIUM: Magnesium: 1.9 mg/dL (ref 1.7–2.4)

## 2020-12-04 LAB — PHOSPHORUS: Phosphorus: 3.6 mg/dL (ref 2.5–4.6)

## 2020-12-04 MED ORDER — METFORMIN HCL 500 MG PO TABS
2000.0000 mg | ORAL_TABLET | Freq: Two times a day (BID) | ORAL | Status: DC
  Administered 2020-12-04 – 2020-12-05 (×2): 2000 mg via ORAL
  Filled 2020-12-04 (×2): qty 4

## 2020-12-04 MED ORDER — TAMSULOSIN HCL 0.4 MG PO CAPS
0.4000 mg | ORAL_CAPSULE | Freq: Every day | ORAL | Status: DC
  Administered 2020-12-04: 0.4 mg via ORAL
  Filled 2020-12-04: qty 1

## 2020-12-04 NOTE — Progress Notes (Signed)
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  Pyuria Work-up performed Nephrology consulted  Hospital-Problem based course  Toxic metabolic encephalopathy  [id number]-0/2 hyponatremia likely Hypervolemic hyponatremia Responding to Lasix 40 mg twice daily however responding slowly-nursing reports patient drinks a lot of water-fluid restriction signed to be placed on door-nursing requested to kindly remove all drinking utensils/receptacles from room If no improvement would consider addition salt tablets a.m.-sodium needs to be close to 130 prior to consideration for discharge Chronic daily drinker Unclear of component of beer potomania?  See above discussion-no signs of withdrawal Chronic low back pain/urinary retention secondary to instrumentation in the past Intermittently self catheterizes?  Also has BPH?-Resume Flomax 0.4 but at once daily dose (has not taken since 2018) DM TY 2 Currently on sliding scale-add back metformin 2 g HTN Thiazide/ACE held from admission-as an outpatient consider addition amlodipine UTI unlikely Diagnosis entertained on admission-multiple colony-forming units on urine culture-no further treatment     DVT prophylaxis: Lovenox Code Status: Full Family Communication:  None Disposition:  Status is: Inpatient  Remains inpatient appropriate because:Hemodynamically unstable, Unsafe d/c plan, and IV treatments appropriate due to intensity of illness or inability to take PO  Dispo:  Patient From: Home  Planned Disposition: Home with Health Care Svc  Medically stable for discharge: No       Consultants:  Dr. Hollie Salk nephrology-signed off  Procedures: None yet  Antimicrobials: None   Subjective: Awake very coherent in no distress EOMI NCAT no focal deficit He is asking when he can go home  Objective: Vitals:   12/03/20 0446 12/03/20 1426 12/03/20 2042 12/04/20 0523  BP: 121/70 137/81 (!) 141/76 140/86  Pulse: 68 75 66 70  Resp: 16 16 16 18   Temp: (!) 97.5 F (36.4 C) 98.2 F (36.8 C) 98.1 F (36.7 C) 98.4 F (36.9 C)  TempSrc: Oral Oral Oral Oral  SpO2: 99% 100% 100% 100%  Weight:      Height:        Intake/Output Summary (Last 24 hours) at 12/04/2020 1124 Last data filed at 12/03/2020 2227 Gross per 24 hour  Intake 240 ml  Output 1825 ml  Net -1585 ml   Filed Weights   11/30/20 0500 12/01/20 0545 12/02/20 0424  Weight: 85.1 kg 87.7 kg 84.9 kg    Examination:  Awake coherent no distress no icterus no pallor Chest clear no added sound no rales no rhonchi Holosystolic murmur Abdomen soft no rebound no guarding cannot appreciate organomegaly ROM intact moving 4 limbs equally without deficit Power 5/5 Smile symmetric   Data Reviewed: personally reviewed   CBC    Component Value Date/Time   WBC 7.8 12/04/2020 0239   RBC 4.09 (L) 12/04/2020 0239   HGB 12.8 (L) 12/04/2020 0239   HCT 37.2 (L) 12/04/2020 0239   PLT 230 12/04/2020 0239   MCV 91.0 12/04/2020 0239   MCH 31.3 12/04/2020 0239   MCHC 34.4 12/04/2020 0239  RDW 12.7 12/04/2020 0239   LYMPHSABS 0.9 11/30/2020 0543   MONOABS 0.9 11/30/2020 0543   EOSABS 0.1 11/30/2020 0543   BASOSABS 0.0 11/30/2020 0543   CMP Latest Ref Rng & Units 12/04/2020 12/03/2020  12/02/2020  Glucose 70 - 99 mg/dL 137(H) 170(H) 167(H)  BUN 8 - 23 mg/dL 22 20 13   Creatinine 0.61 - 1.24 mg/dL 1.26(H) 1.40(H) 1.24  Sodium 135 - 145 mmol/L 126(L) 124(L) 126(L)  Potassium 3.5 - 5.1 mmol/L 4.2 3.9 3.6  Chloride 98 - 111 mmol/L 89(L) 89(L) 87(L)  CO2 22 - 32 mmol/L 27 27 31   Calcium 8.9 - 10.3 mg/dL 9.2 8.6(L) 8.8(L)  Total Protein 6.5 - 8.1 g/dL - - -  Total Bilirubin 0.3 - 1.2 mg/dL - - -  Alkaline Phos 38 - 126 U/L - - -  AST 15 - 41 U/L - - -  ALT 0 - 44 U/L - - -     Radiology Studies: No results found.   Scheduled Meds:  amLODipine  10 mg Oral Daily   enoxaparin (LOVENOX) injection  40 mg Subcutaneous Q24H   furosemide  40 mg Oral BID   gabapentin  600 mg Oral QHS   insulin aspart  0-5 Units Subcutaneous QHS   insulin aspart  0-9 Units Subcutaneous TID WC   metoprolol succinate  25 mg Oral Daily   pantoprazole  40 mg Oral Daily   pravastatin  10 mg Oral QPM   Continuous Infusions:   LOS: 6 days   Time spent: [place], MD [facility] To contact the attending provider between 7A-7P or the covering provider during after hours 7P-7A, please log into the web site [email or web address] and access using universal [place] password for that web site. If you do not have the password, please call the hospital operator.  12/04/2020, 11:24 AM

## 2020-12-04 NOTE — Progress Notes (Signed)
Physical Therapy Treatment Patient Details Name: Donald Calderon MRN: [id number] DOB: 07-11-1940 Today's Date: 12/04/2020    History of Present Illness 80 yo male with onset of confusion and mood change was brought to hospital and found to have UTI, hyponatremia, LE edema.  PMHx:  EtOH abuse, DM, polyneuropathy, HTN, PNA.    PT Comments    Pt was seen for mobility this afternoon with pt verbalizing pain on R knee, obvious edema from OA.  He is motivated to get up and walk but his R knee had a sharp pain on the walk, requiring him to sit down.  Follow up with him to see how the progression of gait goes, since he is home with his impaired vision and will need to be in a safe environment.  Will communicate with discharge planning staff as needed for insuring safety and good care as pt will permit, who has apparently declined to do home therapy.  Follow for acute PT goals.   Follow Up Recommendations  No PT follow up;Supervision for mobility/OOB     Equipment Recommendations  None recommended by PT    Recommendations for Other Services       Precautions / Restrictions Precautions Precautions: Fall;Other (comment) Precaution Comments: blind Restrictions Weight Bearing Restrictions: No    Mobility  Bed Mobility Overal bed mobility: Needs Assistance Bed Mobility: Supine to Sit;Sit to Supine     Supine to sit: Min guard;Min assist Sit to supine: Min guard   General bed mobility comments: requires a bit of time for progression of legs    Transfers Overall transfer level: Needs assistance Equipment used: 4-wheeled walker Transfers: Sit to/from Stand Sit to Stand: Min assist         General transfer comment: min assist to support the effort with R knee pain  Ambulation/Gait Ambulation/Gait assistance: Min guard Gait Distance (Feet): 70 Feet Assistive device: 4-wheeled walker Gait Pattern/deviations: Step-to pattern;Step-through pattern;Decreased stride length;Decreased  weight shift to right;Wide base of support;Trunk flexed Gait velocity: reduced   General Gait Details: R knee pain has altered gait slightly   Stairs             Wheelchair Mobility    Modified Rankin (Stroke Patients Only)       Balance Overall balance assessment: Needs assistance Sitting-balance support: Feet supported Sitting balance-Leahy Scale: Good     Standing balance support: Bilateral upper extremity supported;During functional activity Standing balance-Leahy Scale: Poor Standing balance comment: R knee pain is reducing stability on Rollator                            Cognition Arousal/Alertness: Awake/alert Behavior During Therapy: WFL for tasks assessed/performed Overall Cognitive Status: Within Functional Limits for tasks assessed                                 General Comments: agreed to use heat pack with covering to protect skin      Exercises      General Comments General comments (skin integrity, edema, etc.): pt is expecting to get to bed when gait ends due to pain, accepted meds from nursing and heat pack from clean supply      Pertinent Vitals/Pain Pain Assessment: 0-10 Pain Score: 7  Pain Location: R knee joint Pain Descriptors / Indicators: Grimacing;Sharp Pain Intervention(s): Limited activity within patient's tolerance;Monitored during session;Repositioned;Heat applied;Premedicated before session;RN gave pain  meds during session    Home Living                      Prior Function            PT Goals (current goals can now be found in the care plan section) Acute Rehab PT Goals Patient Stated Goal: go home Progress towards PT goals: Progressing toward goals    Frequency    Min 3X/week      PT Plan Current plan remains appropriate    Co-evaluation              AM-PAC PT "6 Clicks" Mobility   Outcome Measure  Help needed turning from your back to your side while in a flat bed  without using bedrails?: A Little Help needed moving from lying on your back to sitting on the side of a flat bed without using bedrails?: A Little Help needed moving to and from a bed to a chair (including a wheelchair)?: A Little Help needed standing up from a chair using your arms (e.g., wheelchair or bedside chair)?: A Little Help needed to walk in hospital room?: A Little Help needed climbing 3-5 steps with a railing? : A Lot 6 Click Score: 17    End of Session Equipment Utilized During Treatment: Gait belt Activity Tolerance: Patient tolerated treatment well;Patient limited by fatigue Patient left: with call bell/phone within reach;in bed;with bed alarm set Nurse Communication: Mobility status PT Visit Diagnosis: Unsteadiness on feet (R26.81);Muscle weakness (generalized) (M62.81);Difficulty in walking, not elsewhere classified (R26.2)     Time: 4199-1444 PT Time Calculation (min) (ACUTE ONLY): 28 min  Charges:  $Gait Training: 8-22 mins $Therapeutic Activity: 8-22 mins                   Ramond Dial 12/04/2020, 10:28 PM  Mee Hives, PT MS Acute Rehab Dept. Number: [place] and [place]

## 2020-12-05 LAB — GLUCOSE, CAPILLARY
Glucose-Capillary: 143 mg/dL — ABNORMAL HIGH (ref 70–99)
Glucose-Capillary: 149 mg/dL — ABNORMAL HIGH (ref 70–99)

## 2020-12-05 LAB — RENAL FUNCTION PANEL
Albumin: 2.9 g/dL — ABNORMAL LOW (ref 3.5–5.0)
Anion gap: 9 (ref 5–15)
BUN: 23 mg/dL (ref 8–23)
CO2: 26 mmol/L (ref 22–32)
Calcium: 9.2 mg/dL (ref 8.9–10.3)
Chloride: 93 mmol/L — ABNORMAL LOW (ref 98–111)
Creatinine, Ser: 1.31 mg/dL — ABNORMAL HIGH (ref 0.61–1.24)
GFR, Estimated: 55 mL/min — ABNORMAL LOW (ref >60)
Glucose, Bld: 129 mg/dL — ABNORMAL HIGH (ref 70–99)
Phosphorus: 3.7 mg/dL (ref 2.5–4.6)
Potassium: 3.8 mmol/L (ref 3.5–5.1)
Sodium: 128 mmol/L — ABNORMAL LOW (ref 135–145)

## 2020-12-05 MED ORDER — POLYETHYLENE GLYCOL 3350 17 G PO PACK: 17.0000 g | PACK | Freq: Every day | ORAL | Status: AC | PRN

## 2020-12-05 MED ORDER — AMLODIPINE BESYLATE 10 MG PO TABS: 10.0000 mg | ORAL_TABLET | Freq: Every day | ORAL | 3 refills | Status: DC

## 2020-12-05 MED ORDER — TAMSULOSIN HCL 0.4 MG PO CAPS: 0.4000 mg | ORAL_CAPSULE | Freq: Every day | ORAL | 3 refills | Status: AC

## 2020-12-05 MED ORDER — GABAPENTIN 300 MG PO CAPS: 600.0000 mg | ORAL_CAPSULE | Freq: Every day | ORAL | Status: DC

## 2020-12-05 NOTE — Discharge Summary (Signed)
Physician Discharge Summary  Donald Calderon ERX:[id number] DOB: [birth year] DOA: 11/28/2020  PCP: [facility]  Admit date: 11/28/2020 Discharge date: 12/05/2020  Time spent:  35 minutes  Recommendations for Outpatient Follow-up:  Recommend Chem-12, CBC in about 1 week at PCP office Recommend outpatient follow-up with regards to DM TY 2 with regular screening Please review all discharge meds-May need escalation of Lasix in the outpatient setting if still hyponatremic  Discharge Diagnoses:  MAIN problem for hospitalization   Toxic metabolic encephalopathy on admission likely multifactorial Severe hyponatremia likely hypervolemic hyponatremia  Please see below for itemized issues addressed in HOpsital- refer to other progress notes for clarity if needed  Discharge Condition: Improved  Diet recommendation: Regular salt diet  Filed Weights   12/01/20 0545 12/02/20 0424 12/05/20 0503  Weight: 87.7 kg 84.9 kg 85.5 kg    History of present illness:  80 year old white male-normally transports with a wheelchair and uses a Rollator occasionally-nonambulatory 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  Pyuria Work-up performed Nephrology consulted  Hospital Course:  Toxic metabolic encephalopathy  [id number]-80/9 hyponatremia likely Hypervolemic hyponatremia Responding to Lasix 40 mg twice daily--discharge sodium 128--scaled back to Lasix 20 twice daily p.o. which is his discharge home at Long discussion with patient, daughter Donald Calderon  regarding fluid restriction in addition to regular diet Patient has been educated about cutting back on alcohol in addition to change in meds (discontinuation thiazide, eating regular salt diet for the time being He will need close follow-up in the outpatient setting for titration of Lasix for aq as per PCPuaresis  Chronic daily drinker Unclear of component of beer potomania?  See above discussion-no signs of withdrawal Chronic low back pain/urinary retention secondary to instrumentation in the past Intermittently self catheterizes?  Also has BPH?-Resume Flomax 0.4 but at once daily dose (has not taken since 2018) DM TY 2 Currently on sliding scale-add back metformin 2 g Outpatient retinopathy, nephropathy screening HTN Thiazide/ACE held from admission-resumed on discharge prior med of amlodipine 10 for blood pressure control UTI unlikely Diagnosis entertained on admission-multiple colony-forming units on urine culture-no further treatment   Procedures: Multiple Consultations: Nephrology  Discharge Exam: Vitals:   12/04/20 2101 12/05/20 0503  BP: 131/69 115/72  Pulse: 74 68  Resp: 17 17  Temp: 97.9 F (36.6 C) 98.2 F (36.8 C)  SpO2: 99% 99%    Subj on day of d/c   Awake coherent sitting up had breakfast no distress No chest pain no fever no chills Eating drinking   General Exam on discharge  EOMI NCAT no focal deficit arcus senilis present No icterus no pallor Chest clear no rales rhonchi Abdomen soft No lower extremity edema Neurologically intact   Discharge Instructions   Discharge Instructions     Diet - low sodium heart healthy   Complete by: As directed    Discharge instructions   Complete by: As directed    Please make sure that you take in only about 1200 cc of fluid for the next 4 to 5 days until you can get labs at your primary care physician They may decide to adjust  your Lasix dose to either upward or downward as this helps get rid of excess  water You will notice that we discontinued one of your blood pressure medications called Zestoretic which contains a medication that could have caused you to reduce salt You will also noticed that we have recommended to you to eat food with salt and also to drink only 1-1-1/2 beers a day as sometimes chronic beer use can drop your sodium in addition Please ensure that if you have fevers chills nausea vomiting you follow-up in the outpatient setting with your primary care physician We will reorder home health and get a nurse to come out if you are willing to allow this they can do also some teaching about fluid restrictions etc.   Increase activity slowly   Complete by: As directed       Allergies as of 12/05/2020       Reactions   Penicillins Itching, Rash   Has patient had a PCN reaction causing immediate rash, facial/tongue/throat swelling, SOB or lightheadedness with hypotension: No Has patient had a PCN reaction causing severe rash involving mucus membranes or skin necrosis: No Has patient had a PCN reaction that required hospitalization: No Has patient had a PCN reaction occurring within the last 10 years: No If all of the above answers are "NO", then may proceed with Cephalosporin use. Tolerated Ancef        Medication List     STOP taking these medications    clobetasol cream 0.05 % Commonly known as: TEMOVATE   lisinopril-hydrochlorothiazide 20-25 MG tablet Commonly known as: ZESTORETIC       TAKE these medications    amLODipine 10 MG tablet Commonly known as: NORVASC Take 1 tablet (10 mg total) by mouth daily.   cetirizine 10 MG tablet Commonly known as: ZYRTEC Take 10 mg by mouth daily.   fluticasone 50 MCG/ACT nasal spray Commonly known as: FLONASE Place 1 spray into both nostrils daily as needed for rhinitis.   furosemide 20 MG tablet Commonly known as: LASIX Take 20 mg by mouth 2 (two) times daily.   gabapentin 300 MG capsule Commonly known as:  NEURONTIN Take 2 capsules (600 mg total) by mouth at bedtime. What changed:  how much to take when to take this additional instructions   metFORMIN 1000 MG tablet Commonly known as: GLUCOPHAGE Take 2,000 mg by mouth 2 (two) times daily with a meal.   metoprolol succinate 25 MG 24 hr tablet Commonly known as: TOPROL-XL Take 1 tablet (25 mg total) by mouth daily.   pantoprazole 40 MG tablet Commonly known as: PROTONIX Take 1 tablet (40 mg total) by mouth daily.   polyethylene glycol 17 g packet Commonly known as: MIRALAX / GLYCOLAX Take 17 g by mouth daily as needed for mild constipation.   potassium chloride 10 MEQ tablet Commonly known as: KLOR-CON Take 10 mEq by mouth daily.   pravastatin 10 MG tablet Commonly known as: PRAVACHOL Take 1 tablet (10 mg total) by mouth every evening.   SALONPAS EX Apply 1 patch topically daily as needed (pain/aches).   tamsulosin 0.4 MG Caps capsule Commonly known as: FLOMAX Take 1 capsule (0.4 mg total) by mouth daily after supper. What changed: when to take this       Allergies  Allergen Reactions   Penicillins Itching and Rash    Has patient had a PCN reaction causing immediate rash, facial/tongue/throat swelling, SOB or lightheadedness with hypotension: No Has patient had a PCN reaction causing  severe rash involving mucus membranes or skin necrosis: No Has patient had a PCN reaction that required hospitalization: No Has patient had a PCN reaction occurring within the last 10 years: No If all of the above answers are "NO", then may proceed with Cephalosporin use.  Tolerated Ancef     Follow-up Information     [facility] Follow up.   Specialty: Home Health Services Contact information: [place] [address] [phone]                  The results of significant diagnostics from this hospitalization (including imaging, microbiology, ancillary and laboratory) are listed below  for reference.    Significant Diagnostic Studies: DG Chest 2 View  Result Date: 11/28/2020 CLINICAL DATA:  Swelling, cough EXAM: CHEST - 2 VIEW COMPARISON:  05/11/2019 FINDINGS: Heart and mediastinal contours are within normal limits. No focal opacities or effusions. No acute bony abnormality. IMPRESSION: No active cardiopulmonary disease. Electronically Signed   By: Rolm Baptise M.D.   On: 11/28/2020 21:25   ECHOCARDIOGRAM COMPLETE  Result Date: 11/29/2020    ECHOCARDIOGRAM REPORT   Patient Name:   SWAN ZAYED Date of Exam: 11/29/2020 Medical Rec #:  [id number]     Height:       70.0 in Accession #:    [id number]    Weight:       187.6 lb Date of Birth:  26-Apr-1941    BSA:          2.031 m Patient Age:    58 years      BP:           156/86 mmHg Patient Gender: M             HR:           61 bpm. Exam Location:  Inpatient Procedure: 2D Echo Indications:    acute diastolic chf  History:        Patient has prior history of Echocardiogram examinations, most                 recent 05/12/2019. Risk Factors:Hypertension, Dyslipidemia and                 Diabetes.  Sonographer:    Johny Chess Referring Phys: [id number] [place]  Sonographer Comments: Suboptimal subcostal window. IMPRESSIONS  1. Left ventricular ejection fraction, by estimation, is 60 to 65%. The left ventricle has normal function. The left ventricle has no regional wall motion abnormalities. Left ventricular diastolic parameters are indeterminate.  2. Right ventricular systolic function is normal. The right ventricular size is normal.  3. Left atrial size was mildly dilated.  4. The mitral valve is grossly normal. Trivial mitral valve regurgitation. No evidence of mitral stenosis.  5. The aortic valve is grossly normal. Aortic valve regurgitation is mild to moderate. No aortic stenosis is present.  6. Aortic dilatation noted. FINDINGS  Left Ventricle: Left ventricular ejection fraction, by estimation, is 60 to 65%. The left ventricle has  normal function. The left ventricle has no regional wall motion abnormalities. The left ventricular internal cavity size was normal in size. There is  no left ventricular hypertrophy. Left ventricular diastolic parameters are indeterminate. Right Ventricle: The right ventricular size is normal. Right vetricular wall thickness was not well visualized. Right ventricular systolic function is normal. Left Atrium: Left atrial size was mildly dilated. Right Atrium: Right atrial size was normal in size. Pericardium: There is  no evidence of pericardial effusion. Mitral Valve: The mitral valve is grossly normal. Trivial mitral valve regurgitation. No evidence of mitral valve stenosis. Tricuspid Valve: The tricuspid valve is grossly normal. Tricuspid valve regurgitation is not demonstrated. Aortic Valve: The aortic valve is grossly normal. Aortic valve regurgitation is mild to moderate. No aortic stenosis is present. Pulmonic Valve: The pulmonic valve was grossly normal. Pulmonic valve regurgitation is trivial. Aorta: Aortic dilatation noted. IAS/Shunts: The atrial septum is grossly normal.  LEFT VENTRICLE PLAX 2D LVIDd:         4.80 cm Diastology LVIDs:         3.00 cm LV e' medial:    7.07 cm/s LV PW:         1.00 cm LV E/e' medial:  10.0 LV IVS:        1.10 cm LV e' lateral:   10.80 cm/s                        LV E/e' lateral: 6.6  RIGHT VENTRICLE RV S prime:     13.70 cm/s TAPSE (M-mode): 1.8 cm LEFT ATRIUM             Index LA diam:        3.50 cm 1.72 cm/m LA Vol (A2C):   89.3 ml 43.96 ml/m LA Vol (A4C):   51.2 ml 25.20 ml/m LA Biplane Vol: 70.7 ml 34.80 ml/m  AORTIC VALVE LVOT Vmax:   112.00 cm/s LVOT Vmean:  68.700 cm/s LVOT VTI:    0.243 m  AORTA Ao Root diam: 4.00 cm Ao Asc diam:  4.10 cm MITRAL VALVE MV Area (PHT): 2.95 cm    SHUNTS MV Decel Time: 257 msec    Systemic VTI: 0.24 m MV E velocity: 71.00 cm/s MV A velocity: 99.40 cm/s MV E/A ratio:  0.71 Mertie Moores MD Electronically signed by Mertie Moores MD  Signature Date/Time: 11/29/2020/1:01:41 PM    Final     Microbiology: Recent Results (from the past 240 hour(s))  Resp Panel by RT-PCR (Flu A&B, Covid) Nasopharyngeal Swab     Status: None   Collection Time: 11/28/20 10:25 PM   Specimen: Nasopharyngeal Swab; Nasopharyngeal(NP) swabs in vial transport medium  Result Value Ref Range Status   SARS Coronavirus 2 by RT PCR NEGATIVE NEGATIVE Final    Comment: (NOTE) SARS-CoV-2 target nucleic acids are NOT DETECTED.  The SARS-CoV-2 RNA is generally detectable in upper respiratory specimens during the acute phase of infection. The lowest concentration of SARS-CoV-2 viral copies this assay can detect is 138 copies/mL. A negative result does not preclude SARS-Cov-2 infection and should not be used as the sole basis for treatment or other patient management decisions. A negative result may occur with  improper specimen collection/handling, submission of specimen other than nasopharyngeal swab, presence of viral mutation(s) within the areas targeted by this assay, and inadequate number of viral copies(<138 copies/mL). A negative result must be combined with clinical observations, patient history, and epidemiological information. The expected result is Negative.  Fact Sheet for Patients:  EntrepreneurPulse.com.au  Fact Sheet for Healthcare Providers:  IncredibleEmployment.be  This test is no t yet approved or cleared by the [place] FDA and  has been authorized for detection and/or diagnosis of SARS-CoV-2 by FDA under an Emergency Use Authorization (EUA). This EUA will remain  in effect (meaning this test can be used) for the duration of the COVID-19 declaration under Section 564(b)(1) of the Act, 21 U.S.C.section 360bbb-3(b)(1),  unless the authorization is terminated  or revoked sooner.       Influenza A by PCR NEGATIVE NEGATIVE Final   Influenza B by PCR NEGATIVE NEGATIVE Final    Comment:  (NOTE) The Xpert Xpress SARS-CoV-2/FLU/RSV plus assay is intended as an aid in the diagnosis of influenza from Nasopharyngeal swab specimens and should not be used as a sole basis for treatment. Nasal washings and aspirates are unacceptable for Xpert Xpress SARS-CoV-2/FLU/RSV testing.  Fact Sheet for Patients: EntrepreneurPulse.com.au  Fact Sheet for Healthcare Providers: IncredibleEmployment.be  This test is not yet approved or cleared by the [place] FDA and has been authorized for detection and/or diagnosis of SARS-CoV-2 by FDA under an Emergency Use Authorization (EUA). This EUA will remain in effect (meaning this test can be used) for the duration of the COVID-19 declaration under Section 564(b)(1) of the Act, 21 U.S.C. section 360bbb-3(b)(1), unless the authorization is terminated or revoked.  Performed at [facility], [place] [address], [place], [place] [address]   Culture, Urine     Status: Abnormal   Collection Time: 11/28/20 11:54 PM   Specimen: Urine, Random  Result Value Ref Range Status   Specimen Description URINE, RANDOM  Final   Special Requests   Final    NONE Performed at [facility], [place] [address] [place], [place] [address]    Culture MULTIPLE SPECIES PRESENT, SUGGEST RECOLLECTION (A)  Final   Report Status 11/30/2020 FINAL  Final     Labs: Basic Metabolic Panel: Recent Labs  Lab 11/30/20 0543 12/01/20 1338 12/02/20 0015 12/03/20 0106 12/04/20 0239 12/05/20 0041  NA 126* 123* 126* 124* 126* 128*  K 3.1* 2.9* 3.6 3.9 4.2 3.8  CL 89* 85* 87* 89* 89* 93*  CO2 28 30 31 27 27 26   GLUCOSE 122* 145* 167* 170* 137* 129*  BUN 8 12 13 20 22 23   CREATININE 1.07 1.03 1.24 1.40* 1.26* 1.31*  CALCIUM 8.9 8.7* 8.8* 8.6* 9.2 9.2  MG 1.4*  --  2.7* 2.0 1.9  --   PHOS  --   --   --  2.8 3.6 3.7   Liver Function Tests: Recent Labs  Lab 11/30/20 0543 12/05/20 0041  AST 17  --   ALT 11  --   ALKPHOS 43   --   BILITOT 0.8  --   PROT 5.8*  --   ALBUMIN 3.0* 2.9*   No results for input(s): LIPASE, AMYLASE in the last 168 hours. No results for input(s): AMMONIA in the last 168 hours. CBC: Recent Labs  Lab 11/28/20 1751 11/30/20 0543 12/03/20 0106 12/04/20 0239  WBC 5.8 6.5 10.3 7.8  NEUTROABS  --  4.6  --   --   HGB 13.3 13.4 12.3* 12.8*  HCT 38.5* 38.5* 35.6* 37.2*  MCV 89.3 89.1 90.1 91.0  PLT 264 240 227 230   Cardiac Enzymes: No results for input(s): CKTOTAL, CKMB, CKMBINDEX, TROPONINI in the last 168 hours. BNP: BNP (last 3 results) Recent Labs    11/28/20 2047  BNP 51.5    ProBNP (last 3 results) No results for input(s): PROBNP in the last 8760 hours.  CBG: Recent Labs  Lab 12/04/20 0712 12/04/20 1112 12/04/20 1717 12/04/20 2057 12/05/20 0811  GLUCAP 182* 126* 213* 103* 143*       Signed:  Nita Sells MD   [facility] 12/05/2020, 9:23 AM

## 2020-12-05 NOTE — Progress Notes (Signed)
Pt discharged home in stable condition 

## 2020-12-25 ENCOUNTER — Encounter: Payer: Self-pay | Admitting: Podiatry

## 2020-12-25 ENCOUNTER — Other Ambulatory Visit: Payer: Self-pay

## 2020-12-25 ENCOUNTER — Ambulatory Visit: Payer: Medicare HMO | Admitting: Podiatry

## 2020-12-25 DIAGNOSIS — E1169 Type 2 diabetes mellitus with other specified complication: Secondary | ICD-10-CM | POA: Diagnosis not present

## 2020-12-25 DIAGNOSIS — E669 Obesity, unspecified: Secondary | ICD-10-CM

## 2020-12-25 DIAGNOSIS — M79674 Pain in right toe(s): Secondary | ICD-10-CM

## 2020-12-25 DIAGNOSIS — M79675 Pain in left toe(s): Secondary | ICD-10-CM | POA: Diagnosis not present

## 2020-12-25 DIAGNOSIS — B351 Tinea unguium: Secondary | ICD-10-CM

## 2020-12-25 DIAGNOSIS — Z7189 Other specified counseling: Secondary | ICD-10-CM | POA: Diagnosis not present

## 2020-12-31 ENCOUNTER — Encounter: Payer: Self-pay | Admitting: Podiatry

## 2020-12-31 NOTE — Progress Notes (Signed)
  Subjective:  Patient ID: Donald Calderon, male    DOB: [birth year],  MRN: [id number]  Chief Complaint  Patient presents with   Nail Problem    Nail trim 1-5 bilateral   80 y.o. male returns for the above complaint.  Patient presents with thickened elongated dystrophic toenails x10.  Patient states is painful to touch.  Patient would like to have them debrided down.  He is a well-controlled diabetic primary diet control.  He denies any other acute complaints. Objective:  There were no vitals filed for this visit. Podiatric Exam: Vascular: dorsalis pedis and posterior tibial pulses are palpable bilateral. Capillary return is immediate. Temperature gradient is WNL. Skin turgor WNL  Sensorium: Normal Semmes Weinstein monofilament test. Normal tactile sensation bilaterally. Nail Exam: Pt has thick disfigured discolored nails with subungual debris noted bilateral entire nail hallux through fifth toenails.  Pain on palpation to the nails. Ulcer Exam: There is no evidence of ulcer or pre-ulcerative changes or infection. Orthopedic Exam: Muscle tone and strength are WNL. No limitations in general ROM. No crepitus or effusions noted. HAV  B/L.  Hammer toes 2-5  B/L. Skin: No Porokeratosis. No infection or ulcers    Assessment & Plan:   1. Diabetes mellitus type 2 in obese (HCC)   2. Pain due to onychomycosis of toenails of both feet   3. Diabetes education, encounter for     Patient was evaluated and treated and all questions answered.  Diabetic education -Diabetic education the patient the etiology of diabetes and various treatment options were discussed.  Patient is doing well with diet control.  I discussed the importance of maintaining the sugars and its relationship with neuropathy.  I discussed with him as well in terms of the importance of diet control for prevention of amputation.  Patient states understanding  Onychomycosis with pain  -Nails palliatively debrided as below. -Educated  on self-care  Procedure: Nail Debridement Rationale: pain  Type of Debridement: manual, sharp debridement. Instrumentation: Nail nipper, rotary burr. Number of Nails: 10  Procedures and Treatment: Consent by patient was obtained for treatment procedures. The patient understood the discussion of treatment and procedures well. All questions were answered thoroughly reviewed. Debridement of mycotic and hypertrophic toenails, 1 through 5 bilateral and clearing of subungual debris. No ulceration, no infection noted.  Return Visit-Office Procedure: Patient instructed to return to the office for a follow up visit 3 months for continued evaluation and treatment.  Boneta Lucks, DPM    Return in about 3 months (around 03/27/2021).

## 2021-02-20 ENCOUNTER — Encounter (HOSPITAL_COMMUNITY): Payer: Self-pay

## 2021-02-20 ENCOUNTER — Emergency Department (HOSPITAL_COMMUNITY)
Admission: EM | Admit: 2021-02-20 | Discharge: 2021-02-21 | Disposition: A | Discharge status: Home / Self Care | Payer: Medicare HMO | Attending: Emergency Medicine | Admitting: Emergency Medicine

## 2021-02-20 ENCOUNTER — Other Ambulatory Visit: Payer: Self-pay

## 2021-02-20 ENCOUNTER — Emergency Department (HOSPITAL_COMMUNITY): Payer: Medicare HMO

## 2021-02-20 DIAGNOSIS — Z5321 Procedure and treatment not carried out due to patient leaving prior to being seen by health care provider: Secondary | ICD-10-CM | POA: Insufficient documentation

## 2021-02-20 DIAGNOSIS — W01198A Fall on same level from slipping, tripping and stumbling with subsequent striking against other object, initial encounter: Secondary | ICD-10-CM | POA: Diagnosis not present

## 2021-02-20 DIAGNOSIS — S0990XA Unspecified injury of head, initial encounter: Secondary | ICD-10-CM | POA: Insufficient documentation

## 2021-02-20 NOTE — ED Triage Notes (Signed)
Per EMS daughter found patient on front porch and unsure how long he was there. Patient stated he fell and has been using ETOH. [id number]

## 2021-02-20 NOTE — ED Notes (Signed)
Pt taken to the restroom with a one person assist. Pt being extremely rude staff

## 2021-02-20 NOTE — ED Provider Notes (Signed)
Emergency Medicine Provider Triage Evaluation Note  Donald Calderon , a 80 y.o. male  was evaluated in triage.  Pt complains of a head injury after a fall that occurred approxi-1 hour ago.  Patient states he fell on his porch and struck his head.  He denies any loss of consciousness, nausea, vomiting, neck pain.  Review of Systems  Positive:  Negative: See above   Physical Exam  BP (!) 152/89 (BP Location: Left Arm)   Pulse 69   Temp 98.4 F (36.9 C) (Oral)   Resp 16   Ht '5\' 10"'$  (1.778 m)   Wt 85 kg   SpO2 99%   BMI 26.89 kg/m  Gen:   Awake, no distress   Resp:  Normal effort  MSK:   Moves extremities without difficulty  Other:  Head is atraumatic and normocephalic.  Negative raccoon eyes and battle sign.  No midline tenderness over the cervical, thoracic, lumbar spine.  Medical Decision Making  Medically screening exam initiated at 4:22 PM.  Appropriate orders placed.  Donald Calderon was informed that the remainder of the evaluation will be completed by another provider, this initial triage assessment does not replace that evaluation, and the importance of remaining in the ED until their evaluation is complete.     Myna Bright [place], PA-C 02/20/21 1624    Milton Ferguson, MD 02/22/21 1229

## 2021-02-21 NOTE — ED Notes (Signed)
Called 3x for room placement. Eloped.

## 2021-03-28 ENCOUNTER — Ambulatory Visit: Payer: Medicare HMO | Admitting: Podiatry

## 2021-05-01 ENCOUNTER — Telehealth: Payer: Self-pay

## 2021-05-01 NOTE — Telephone Encounter (Signed)
NOTES SCANNED TO REFERRAL 

## 2021-05-22 NOTE — Progress Notes (Signed)
Cardiology Office Note:    Date:  05/23/2021   ID:  Donald Calderon, DOB 1941-06-14, MRN [id number]  PCP:  [facility]   [facility] Providers Cardiologist:  Lenna Sciara, MD Referring MD: Drue Flirt, MD   Chief Complaint/Reason for Referral:  Edema    ASSESSMENT & PLAN:    Diastolic dysfunction with chronic heart failure [facility]) The patient developed peripheral edema.  His ejection fraction is normal.  Despite the fact that his last echo demonstrated an indeterminate markers for diastolic dysfunction given his age and history of diabetes he most likely certainly does have diastolic dysfunction.  For this reason we will start Jardiance 10 mg daily.  Will increase lasix to 40mg  BID, check Cr next week.  Consider adding spironolactone depending on creatinine later.  Follow up in 3 months.  Type 2 diabetes mellitus with diabetic neuropathy, without long-term current use of insulin ([place]) The patient is on a statin.  Given his history of diabetes should be on aspirin we will start him 81 mg daily.  We will discontinue amlodipine and start losartan 12.5 mg daily.  Check CMP in 1 week's time.  Hyperlipidemia, unspecified hyperlipidemia type No recent lipid panel is available for review.  He is on a very low dose of pravastatin.  Check lipid panel/CMP next week.  Hypertension, unspecified type Changes as above, BP is stable today.   Dispo:  No follow-ups on file.      Medication Adjustments/Labs and Tests Ordered: Current medicines are reviewed at length with the patient today.  Concerns regarding medicines are outlined above.    Tests Ordered: Orders Placed This Encounter  Procedures   Comprehensive metabolic panel   Lipid panel      Medication Changes: Meds ordered this encounter  Medications   furosemide (LASIX) 40 MG tablet    Sig: Take 1 tablet (40 mg total) by mouth daily.    Dispense:  180 tablet    Refill:  3    Dose increase    losartan (COZAAR) 25 MG tablet    Sig: Take 0.5 tablets (12.5 mg total) by mouth daily.    Dispense:  45 tablet    Refill:  3   empagliflozin (JARDIANCE) 10 MG TABS tablet    Sig: Take 1 tablet (10 mg total) by mouth daily before breakfast.    Dispense:  90 tablet    Refill:  3      History of Present Illness:    FOCUSED CARDIOVASCULAR PROBLEM LIST:     1.  Type 2 diabetes mellitus 2.  Hyperlipidemia 3.  Hypertension 4.  Chronic kidney disease with a creatinine of around 1.3   The patient is a 80 y.o. male with the indicated medical history here for recommendations regarding peripheral edema.  The patient was seen by his primary care provider last month.  He had developed peripheral edema and hyponatremia consistent with acute on chronic diastolic heart failure.  His ejection fraction was noted to be normal.  He was started on Lasix with significant improvement.  He is referred for further recommendations.  The patient tells me that he has been doing fairly well aside from peripheral edema.  The edema is bilateral.  It had responded to Lasix initially.  He is currently on Lasix 20 mg twice a day.  In terms of other symptoms he denies chest pain, shortness of breath, palpitations, orthopnea, paroxysmal nocturnal dyspnea, or severe bleeding.  He is required no emergency room  visits or hospitalizations recently.   Previous Medical History: Past Medical History:  Diagnosis Date   Abdominal aortic aneurysm    ALCOHOL ABUSE    Allergic rhinitis    Anxiety    Arthritis    Atopic dermatitis    Atopic dermatitis    BPH (benign prostatic hyperplasia)    Decreased functional mobility and endurance    Depression    Diabetes mellitus    Diabetic neuropathy (HCC)    Diabetic retinopathy (HCC)    GERD (gastroesophageal reflux disease)    Glaucoma    HTN (hypertension)    Hyperlipidemia    Hypertension    Hyponatremia    Impaired vision    Leg edema    Mild mitral valve regurgitation     Overweight    Peripheral edema    Pneumonia    Shingles    Vitamin D deficiency      Current Medications: Current Meds  Medication Sig   empagliflozin (JARDIANCE) 10 MG TABS tablet Take 1 tablet (10 mg total) by mouth daily before breakfast.   fluticasone (FLONASE) 50 MCG/ACT nasal spray Place 1 spray into both nostrils daily as needed for rhinitis.   furosemide (LASIX) 40 MG tablet Take 1 tablet (40 mg total) by mouth daily.   gabapentin (NEURONTIN) 300 MG capsule Take 2 capsules (600 mg total) by mouth at bedtime.   losartan (COZAAR) 25 MG tablet Take 0.5 tablets (12.5 mg total) by mouth daily.   metFORMIN (GLUCOPHAGE) 1000 MG tablet Take 2,000 mg by mouth 2 (two) times daily with a meal.   metoprolol succinate (TOPROL-XL) 25 MG 24 hr tablet Take 1 tablet (25 mg total) by mouth daily.   polyethylene glycol (MIRALAX / GLYCOLAX) 17 g packet Take 17 g by mouth daily as needed for mild constipation.   potassium chloride (KLOR-CON) 10 MEQ tablet Take 10 mEq by mouth daily.   pravastatin (PRAVACHOL) 10 MG tablet Take 1 tablet (10 mg total) by mouth every evening.   sildenafil (VIAGRA) 25 MG tablet Take 25 mg by mouth daily as needed for erectile dysfunction.   tamsulosin (FLOMAX) 0.4 MG CAPS capsule Take 1 capsule (0.4 mg total) by mouth daily after supper.   [DISCONTINUED] amLODipine (NORVASC) 10 MG tablet Take 1 tablet (10 mg total) by mouth daily.   [DISCONTINUED] furosemide (LASIX) 20 MG tablet Take 20 mg by mouth 2 (two) times daily.     Allergies:    Shellfish allergy and Penicillins   Social History:   Social History   Tobacco Use   Smoking status: Former    Packs/day: 1.00    Years: 39.00    Pack years: 39.00    Types: Cigarettes    Quit date: 2000    Years since quitting: 22.9   Smokeless tobacco: Former    Types: Chew    Quit date: 09/26/1997  Vaping Use   Vaping Use: Never used  Substance Use Topics   Alcohol use: Yes    Alcohol/week: 4.0 standard drinks     Types: 4 Cans of beer per week    Comment: 4 cans of beer per day   Drug use: No     Family Hx: Family History  Problem Relation Age of Onset   Diabetes Mother    Diabetes Father      Review of Systems:   Please see the history of present illness.    All other systems reviewed and are negative.  EKGs/Labs/Other Test Reviewed:  EKG:  EKG t sinus rhythm  Prior CV studies:  ECHO COMPLETE WO IMAGING ENHANCING AGENT 11/29/2020  Narrative ECHOCARDIOGRAM REPORT    Patient Name:   JAYKOB MINICHIELLO Date of Exam: 11/29/2020 Medical Rec #:  [id number]     Height:       70.0 in Accession #:    [id number]    Weight:       187.6 lb Date of Birth:  04-23-1941    BSA:          2.031 m Patient Age:    41 years      BP:           156/86 mmHg Patient Gender: M             HR:           61 bpm. Exam Location:  Inpatient  Procedure: 2D Echo  Indications:    acute diastolic chf  History:        Patient has prior history of Echocardiogram examinations, most recent 05/12/2019. Risk Factors:Hypertension, Dyslipidemia and Diabetes.  Sonographer:    Johny Chess Referring Phys: [id number] [place]   Sonographer Comments: Suboptimal subcostal window. IMPRESSIONS   1. Left ventricular ejection fraction, by estimation, is 60 to 65%. The left ventricle has normal function. The left ventricle has no regional wall motion abnormalities. Left ventricular diastolic parameters are indeterminate. 2. Right ventricular systolic function is normal. The right ventricular size is normal. 3. Left atrial size was mildly dilated. 4. The mitral valve is grossly normal. Trivial mitral valve regurgitation. No evidence of mitral stenosis. 5. The aortic valve is grossly normal. Aortic valve regurgitation is mild to moderate. No aortic stenosis is present. 6. Aortic dilatation noted.  FINDINGS Left Ventricle: Left ventricular ejection fraction, by estimation, is 60 to 65%. The left ventricle has normal  function. The left ventricle has no regional wall motion abnormalities. The left ventricular internal cavity size was normal in size. There is no left ventricular hypertrophy. Left ventricular diastolic parameters are indeterminate.  Right Ventricle: The right ventricular size is normal. Right vetricular wall thickness was not well visualized. Right ventricular systolic function is normal.  Left Atrium: Left atrial size was mildly dilated.  Right Atrium: Right atrial size was normal in size.  Pericardium: There is no evidence of pericardial effusion.  Mitral Valve: The mitral valve is grossly normal. Trivial mitral valve regurgitation. No evidence of mitral valve stenosis.  Tricuspid Valve: The tricuspid valve is grossly normal. Tricuspid valve regurgitation is not demonstrated.  Aortic Valve: The aortic valve is grossly normal. Aortic valve regurgitation is mild to moderate. No aortic stenosis is present.  Pulmonic Valve: The pulmonic valve was grossly normal. Pulmonic valve regurgitation is trivial.  Aorta: Aortic dilatation noted.  IAS/Shunts: The atrial septum is grossly normal.   LEFT VENTRICLE PLAX 2D LVIDd:         4.80 cm Diastology LVIDs:         3.00 cm LV e' medial:    7.07 cm/s LV PW:         1.00 cm LV E/e' medial:  10.0 LV IVS:        1.10 cm LV e' lateral:   10.80 cm/s LV E/e' lateral: 6.6   RIGHT VENTRICLE RV S prime:     13.70 cm/s TAPSE (M-mode): 1.8 cm  LEFT ATRIUM             Index LA diam:  3.50 cm 1.72 cm/m LA Vol (A2C):   89.3 ml 43.96 ml/m LA Vol (A4C):   51.2 ml 25.20 ml/m LA Biplane Vol: 70.7 ml 34.80 ml/m AORTIC VALVE LVOT Vmax:   112.00 cm/s LVOT Vmean:  68.700 cm/s LVOT VTI:    0.243 m  AORTA Ao Root diam: 4.00 cm Ao Asc diam:  4.10 cm  MITRAL VALVE MV Area (PHT): 2.95 cm    SHUNTS MV Decel Time: 257 msec    Systemic VTI: 0.24 m MV E velocity: 71.00 cm/s MV A velocity: 99.40 cm/s MV E/A ratio:  0.71  Mertie Moores  MD Electronically signed by Mertie Moores MD Signature Date/Time: 11/29/2020/1:01:41 PM    Final     Imaging studies that I have independently reviewed today: Echo  Recent Labs: Outside labs from July 2022 demonstrated sodium 138 potassium 5, BUN 17, creatinine 1.1   11/28/2020: B Natriuretic Peptide 51.5 11/29/2020: TSH 3.138 11/30/2020: ALT 11 12/04/2020: Hemoglobin 12.8; Magnesium 1.9; Platelets 230 12/05/2020: BUN 23; Creatinine, Ser 1.31; Potassium 3.8; Sodium 128   Recent Lipid Panel Lab Results  Component Value Date/Time   CHOL 173 05/27/2012 09:52 AM   TRIG 87.0 05/27/2012 09:52 AM   HDL 41.80 05/27/2012 09:52 AM   LDLCALC 114 (H) 05/27/2012 09:52 AM     Risk Assessment/Calculations:          Physical Exam:    VS:  BP 126/66   Pulse 83   Ht 5\' 9"  (1.753 m)   Wt 209 lb (94.8 kg)   SpO2 97%   BMI 30.86 kg/m    Wt Readings from Last 3 Encounters:  05/23/21 209 lb (94.8 kg)  02/20/21 187 lb 6.3 oz (85 kg)  12/05/20 188 lb 9.6 oz (85.5 kg)    GENERAL:  No apparent distress, AOx3 HEENT:  No carotid bruits, +2 carotid impulses, no scleral icterus CAR: RRR, no murmurs, gallops, rubs, or thrills RES:  Clear to auscultation bilaterally ABD:  Soft, nontender, nondistended, positive bowel sounds x 4 VASC:  +2 radial pulses, +2 carotid pulses, palpable pedal pulses NEURO:  CN 2-12 grossly intact; motor and sensory grossly intact PSYCH:  No active depression or anxiety EXT:  +3 edema, without ecchymosis, or cyanosis    Signed, Early Osmond, MD  05/23/2021 1:10 PM    [place], [place], [place]  [address] Phone: [phone]; Fax: [phone]

## 2021-05-23 ENCOUNTER — Other Ambulatory Visit: Payer: Self-pay

## 2021-05-23 ENCOUNTER — Encounter: Payer: Self-pay | Admitting: Internal Medicine

## 2021-05-23 ENCOUNTER — Ambulatory Visit: Payer: Medicare HMO | Admitting: Internal Medicine

## 2021-05-23 VITALS — BP 126/66 | HR 83 | Ht 69.0 in | Wt 209.0 lb

## 2021-05-23 DIAGNOSIS — I10 Essential (primary) hypertension: Secondary | ICD-10-CM | POA: Diagnosis not present

## 2021-05-23 DIAGNOSIS — E1140 Type 2 diabetes mellitus with diabetic neuropathy, unspecified: Secondary | ICD-10-CM

## 2021-05-23 DIAGNOSIS — I5032 Chronic diastolic (congestive) heart failure: Secondary | ICD-10-CM | POA: Diagnosis not present

## 2021-05-23 DIAGNOSIS — E785 Hyperlipidemia, unspecified: Secondary | ICD-10-CM

## 2021-05-23 MED ORDER — EMPAGLIFLOZIN 10 MG PO TABS
10.0000 mg | ORAL_TABLET | Freq: Every day | ORAL | 3 refills | Status: DC
Start: 2021-05-23 — End: 2022-06-17

## 2021-05-23 MED ORDER — LOSARTAN POTASSIUM 25 MG PO TABS: 12.5000 mg | ORAL_TABLET | Freq: Every day | ORAL | 3 refills | Status: DC

## 2021-05-23 MED ORDER — FUROSEMIDE 40 MG PO TABS: 40.0000 mg | ORAL_TABLET | Freq: Every day | ORAL | 3 refills | Status: DC

## 2021-05-23 NOTE — Patient Instructions (Signed)
Medication Instructions:  Your physician has recommended you make the following change in your medication:  1.) STOP amlodipine 2.) START losartan 25 mg --HALF TABLET (12.5 mg) daily 3.) START Jardiance 10 mg - one tablet every morning 4.) INCREASE furosemide (Lasix) to 40 mg twice a day  *If you need a refill on your cardiac medications before your next appointment, please call your pharmacy*   Lab Work: Please return in 1 week for fasting blood work (cmet, lipids) If you have labs (blood work) drawn today and your tests are completely normal, you will receive your results only by: [place] (if you have MyChart) OR A paper copy in the mail If you have any lab test that is abnormal or we need to change your treatment, we will call you to review the results.   Testing/Procedures: none   Follow-Up: At [facility], you and your health needs are our priority.  As part of our continuing mission to provide you with exceptional heart care, we have created designated Provider Care Teams.  These Care Teams include your primary Cardiologist (physician) and Advanced Practice Providers (APPs -  Physician Assistants and Nurse Practitioners) who all work together to provide you with the care you need, when you need it.   Your next appointment:   3 month(s)  The format for your next appointment:   In Person  Provider:   Lenna Sciara, MD

## 2021-06-02 ENCOUNTER — Other Ambulatory Visit: Payer: Medicare HMO

## 2021-07-03 ENCOUNTER — Emergency Department (HOSPITAL_COMMUNITY): Payer: Medicare HMO

## 2021-07-03 ENCOUNTER — Other Ambulatory Visit: Payer: Self-pay

## 2021-07-03 ENCOUNTER — Observation Stay (HOSPITAL_COMMUNITY)
Admission: EM | Admit: 2021-07-03 | Discharge: 2021-07-04 | Disposition: A | Discharge status: Home / Self Care | Payer: Medicare HMO | Attending: Family Medicine | Admitting: Family Medicine

## 2021-07-03 ENCOUNTER — Encounter (HOSPITAL_COMMUNITY): Payer: Self-pay | Admitting: *Deleted

## 2021-07-03 DIAGNOSIS — R4182 Altered mental status, unspecified: Secondary | ICD-10-CM

## 2021-07-03 DIAGNOSIS — Z87891 Personal history of nicotine dependence: Secondary | ICD-10-CM | POA: Diagnosis not present

## 2021-07-03 DIAGNOSIS — Z79899 Other long term (current) drug therapy: Secondary | ICD-10-CM | POA: Diagnosis not present

## 2021-07-03 DIAGNOSIS — I714 Abdominal aortic aneurysm, without rupture, unspecified: Secondary | ICD-10-CM

## 2021-07-03 DIAGNOSIS — I10 Essential (primary) hypertension: Secondary | ICD-10-CM | POA: Insufficient documentation

## 2021-07-03 DIAGNOSIS — G8929 Other chronic pain: Secondary | ICD-10-CM | POA: Diagnosis present

## 2021-07-03 DIAGNOSIS — R4781 Slurred speech: Secondary | ICD-10-CM | POA: Diagnosis present

## 2021-07-03 DIAGNOSIS — N390 Urinary tract infection, site not specified: Secondary | ICD-10-CM | POA: Diagnosis present

## 2021-07-03 DIAGNOSIS — E669 Obesity, unspecified: Secondary | ICD-10-CM

## 2021-07-03 DIAGNOSIS — Z7984 Long term (current) use of oral hypoglycemic drugs: Secondary | ICD-10-CM | POA: Insufficient documentation

## 2021-07-03 DIAGNOSIS — Z20822 Contact with and (suspected) exposure to covid-19: Secondary | ICD-10-CM | POA: Diagnosis not present

## 2021-07-03 DIAGNOSIS — R600 Localized edema: Secondary | ICD-10-CM

## 2021-07-03 DIAGNOSIS — E119 Type 2 diabetes mellitus without complications: Secondary | ICD-10-CM | POA: Diagnosis not present

## 2021-07-03 DIAGNOSIS — M549 Dorsalgia, unspecified: Secondary | ICD-10-CM | POA: Insufficient documentation

## 2021-07-03 DIAGNOSIS — G9341 Metabolic encephalopathy: Secondary | ICD-10-CM | POA: Diagnosis not present

## 2021-07-03 DIAGNOSIS — F1010 Alcohol abuse, uncomplicated: Secondary | ICD-10-CM | POA: Diagnosis present

## 2021-07-03 DIAGNOSIS — N3001 Acute cystitis with hematuria: Secondary | ICD-10-CM

## 2021-07-03 DIAGNOSIS — E1169 Type 2 diabetes mellitus with other specified complication: Secondary | ICD-10-CM

## 2021-07-03 DIAGNOSIS — I71012 Dissection of descending thoracic aorta: Secondary | ICD-10-CM

## 2021-07-03 LAB — AMMONIA: Ammonia: 33 umol/L (ref 9–35)

## 2021-07-03 LAB — CBC WITH DIFFERENTIAL/PLATELET
Abs Immature Granulocytes: 0.02 10*3/uL (ref 0.00–0.07)
Basophils Absolute: 0 10*3/uL (ref 0.0–0.1)
Basophils Relative: 1 %
Eosinophils Absolute: 0.3 10*3/uL (ref 0.0–0.5)
Eosinophils Relative: 6 %
HCT: 35.9 % — ABNORMAL LOW (ref 39.0–52.0)
Hemoglobin: 11.3 g/dL — ABNORMAL LOW (ref 13.0–17.0)
Immature Granulocytes: 0 %
Lymphocytes Relative: 27 %
Lymphs Abs: 1.3 10*3/uL (ref 0.7–4.0)
MCH: 31.7 pg (ref 26.0–34.0)
MCHC: 31.5 g/dL (ref 30.0–36.0)
MCV: 100.6 fL — ABNORMAL HIGH (ref 80.0–100.0)
Monocytes Absolute: 0.6 10*3/uL (ref 0.1–1.0)
Monocytes Relative: 12 %
Neutro Abs: 2.7 10*3/uL (ref 1.7–7.7)
Neutrophils Relative %: 54 %
Platelets: 186 10*3/uL (ref 150–400)
RBC: 3.57 MIL/uL — ABNORMAL LOW (ref 4.22–5.81)
RDW: 14.2 % (ref 11.5–15.5)
WBC: 5 10*3/uL (ref 4.0–10.5)
nRBC: 0 % (ref 0.0–0.2)

## 2021-07-03 LAB — SALICYLATE LEVEL: Salicylate Lvl: <7 mg/dL — ABNORMAL LOW (ref 7.0–30.0)

## 2021-07-03 LAB — URINALYSIS, ROUTINE W REFLEX MICROSCOPIC
Bilirubin Urine: NEGATIVE
Glucose, UA: NEGATIVE mg/dL
Hgb urine dipstick: NEGATIVE
Ketones, ur: NEGATIVE mg/dL
Nitrite: NEGATIVE
Protein, ur: NEGATIVE mg/dL
Specific Gravity, Urine: 1.01 (ref 1.005–1.030)
pH: 6.5 (ref 5.0–8.0)

## 2021-07-03 LAB — COMPREHENSIVE METABOLIC PANEL
ALT: 14 U/L (ref 0–44)
AST: 25 U/L (ref 15–41)
Albumin: 3 g/dL — ABNORMAL LOW (ref 3.5–5.0)
Alkaline Phosphatase: 45 U/L (ref 38–126)
Anion gap: 7 (ref 5–15)
BUN: 11 mg/dL (ref 8–23)
CO2: 22 mmol/L (ref 22–32)
Calcium: 8.3 mg/dL — ABNORMAL LOW (ref 8.9–10.3)
Chloride: 108 mmol/L (ref 98–111)
Creatinine, Ser: 1.09 mg/dL (ref 0.61–1.24)
GFR, Estimated: >60 mL/min (ref >60)
Glucose, Bld: 89 mg/dL (ref 70–99)
Potassium: 4.2 mmol/L (ref 3.5–5.1)
Sodium: 137 mmol/L (ref 135–145)
Total Bilirubin: 0.6 mg/dL (ref 0.3–1.2)
Total Protein: 5.8 g/dL — ABNORMAL LOW (ref 6.5–8.1)

## 2021-07-03 LAB — ETHANOL
Alcohol, Ethyl (B): 281 mg/dL — ABNORMAL HIGH (ref <10)
Alcohol, Ethyl (B): 103 mg/dL — ABNORMAL HIGH (ref <10)

## 2021-07-03 LAB — URINALYSIS, MICROSCOPIC (REFLEX)

## 2021-07-03 LAB — OSMOLALITY: Osmolality: 351 mOsm/kg (ref 275–295)

## 2021-07-03 LAB — PROTIME-INR
INR: 1.1 (ref 0.8–1.2)
Prothrombin Time: 14.4 seconds (ref 11.4–15.2)

## 2021-07-03 LAB — RESP PANEL BY RT-PCR (FLU A&B, COVID) ARPGX2
Influenza A by PCR: NEGATIVE
Influenza B by PCR: NEGATIVE
SARS Coronavirus 2 by RT PCR: NEGATIVE

## 2021-07-03 LAB — ACETAMINOPHEN LEVEL: Acetaminophen (Tylenol), Serum: <10 ug/mL — ABNORMAL LOW (ref 10–30)

## 2021-07-03 LAB — LACTIC ACID, PLASMA
Lactic Acid, Venous: 2.4 mmol/L (ref 0.5–1.9)
Lactic Acid, Venous: 3.4 mmol/L (ref 0.5–1.9)

## 2021-07-03 LAB — CK: Total CK: 116 U/L (ref 49–397)

## 2021-07-03 LAB — BRAIN NATRIURETIC PEPTIDE: B Natriuretic Peptide: 111.5 pg/mL — ABNORMAL HIGH (ref 0.0–100.0)

## 2021-07-03 MED ORDER — LORAZEPAM 2 MG/ML IJ SOLN: 0.0000 mg | Freq: Two times a day (BID) | INTRAMUSCULAR | Status: DC

## 2021-07-03 MED ORDER — LORAZEPAM 1 MG PO TABS: 1.0000 mg | ORAL_TABLET | ORAL | Status: DC | PRN

## 2021-07-03 MED ORDER — THIAMINE HCL 100 MG/ML IJ SOLN: 100.0000 mg | Freq: Every day | INTRAMUSCULAR | Status: DC

## 2021-07-03 MED ORDER — LOSARTAN POTASSIUM 25 MG PO TABS
12.5000 mg | ORAL_TABLET | Freq: Every day | ORAL | Status: DC
  Administered 2021-07-04: 12.5 mg via ORAL
  Filled 2021-07-03: qty 0.5
  Filled 2021-07-03: qty 1

## 2021-07-03 MED ORDER — LORAZEPAM 2 MG/ML IJ SOLN
1.0000 mg | INTRAMUSCULAR | Status: DC | PRN
  Filled 2021-07-03: qty 1

## 2021-07-03 MED ORDER — METOPROLOL SUCCINATE ER 25 MG PO TB24
25.0000 mg | ORAL_TABLET | Freq: Every day | ORAL | Status: DC
  Administered 2021-07-04: 25 mg via ORAL
  Filled 2021-07-03: qty 1

## 2021-07-03 MED ORDER — SODIUM CHLORIDE 0.9 % IV SOLN: 250.0000 mL | INTRAVENOUS | Status: DC | PRN

## 2021-07-03 MED ORDER — LORAZEPAM 2 MG/ML IJ SOLN
1.0000 mg | Freq: Once | INTRAMUSCULAR | Status: AC
  Administered 2021-07-03: 1 mg via INTRAVENOUS
  Filled 2021-07-03: qty 1

## 2021-07-03 MED ORDER — ADULT MULTIVITAMIN W/MINERALS CH
1.0000 | ORAL_TABLET | Freq: Every day | ORAL | Status: DC
  Administered 2021-07-04: 1 via ORAL
  Filled 2021-07-03: qty 1

## 2021-07-03 MED ORDER — SODIUM CHLORIDE 0.9 % IV SOLN: 1.0000 g | INTRAVENOUS | Status: DC

## 2021-07-03 MED ORDER — FUROSEMIDE 40 MG PO TABS
40.0000 mg | ORAL_TABLET | Freq: Every day | ORAL | Status: DC
  Administered 2021-07-04: 40 mg via ORAL
  Filled 2021-07-03: qty 1

## 2021-07-03 MED ORDER — SODIUM CHLORIDE 0.9 % IV SOLN
1.0000 g | Freq: Once | INTRAVENOUS | Status: AC
  Administered 2021-07-03: 1 g via INTRAVENOUS
  Filled 2021-07-03: qty 10

## 2021-07-03 MED ORDER — LORAZEPAM 2 MG/ML IJ SOLN
0.0000 mg | Freq: Four times a day (QID) | INTRAMUSCULAR | Status: DC
  Administered 2021-07-03: 2 mg via INTRAVENOUS
  Filled 2021-07-03: qty 1

## 2021-07-03 MED ORDER — TAMSULOSIN HCL 0.4 MG PO CAPS: 0.4000 mg | ORAL_CAPSULE | Freq: Every day | ORAL | Status: DC

## 2021-07-03 MED ORDER — IOHEXOL 350 MG/ML SOLN
100.0000 mL | Freq: Once | INTRAVENOUS | Status: AC | PRN
  Administered 2021-07-03: 100 mL via INTRAVENOUS

## 2021-07-03 MED ORDER — SODIUM CHLORIDE 0.9% FLUSH
3.0000 mL | Freq: Two times a day (BID) | INTRAVENOUS | Status: DC
  Administered 2021-07-03 – 2021-07-04 (×2): 3 mL via INTRAVENOUS

## 2021-07-03 MED ORDER — THIAMINE HCL 100 MG PO TABS
100.0000 mg | ORAL_TABLET | Freq: Every day | ORAL | Status: DC
  Administered 2021-07-04 (×2): 100 mg via ORAL
  Filled 2021-07-03 (×2): qty 1

## 2021-07-03 MED ORDER — FOLIC ACID 1 MG PO TABS
1.0000 mg | ORAL_TABLET | Freq: Every day | ORAL | Status: DC
  Administered 2021-07-04: 1 mg via ORAL
  Filled 2021-07-03: qty 1

## 2021-07-03 MED ORDER — INSULIN ASPART 100 UNIT/ML IJ SOLN: 0.0000 [IU] | Freq: Three times a day (TID) | INTRAMUSCULAR | Status: DC

## 2021-07-03 MED ORDER — HALOPERIDOL LACTATE 5 MG/ML IJ SOLN
2.0000 mg | Freq: Once | INTRAMUSCULAR | Status: AC
  Administered 2021-07-03: 2 mg via INTRAMUSCULAR
  Filled 2021-07-03: qty 1

## 2021-07-03 MED ORDER — SODIUM CHLORIDE 0.9% FLUSH: 3.0000 mL | INTRAVENOUS | Status: DC | PRN

## 2021-07-03 MED ORDER — PRAVASTATIN SODIUM 10 MG PO TABS
10.0000 mg | ORAL_TABLET | Freq: Every evening | ORAL | Status: DC
  Filled 2021-07-03: qty 1

## 2021-07-03 MED ORDER — POTASSIUM CHLORIDE CRYS ER 10 MEQ PO TBCR
10.0000 meq | EXTENDED_RELEASE_TABLET | Freq: Every day | ORAL | Status: DC
  Administered 2021-07-04: 10 meq via ORAL
  Filled 2021-07-03 (×3): qty 1

## 2021-07-03 MED ORDER — ZIPRASIDONE MESYLATE 20 MG IM SOLR: 20.0000 mg | Freq: Once | INTRAMUSCULAR | Status: DC

## 2021-07-03 NOTE — ED Notes (Signed)
Patient is verbally aggressive towards the RN and on the phone to his daughter. Patient is repeatedly stating that he wants to go home and for his daughter to get him out of here. RN is unable to leave room to ensure patient safety. Provider made aware.

## 2021-07-03 NOTE — ED Provider Notes (Signed)
[facility] EMERGENCY DEPARTMENT Provider Note   CSN: [id number] Arrival date & time: 07/03/21  1044    History  AMS  Donald Calderon is a 81 y.o. male here for evaluation of altered mental status, slurred speech.  Daughter spoke with patient yesterday around 8 AM.  Noted to have slurred speech.  Daughter thought this is due to EtOH use, significant other recently passed, his history of alcohol abuse, drinking more thank normal.  Went to see patient today noted to be lying in bed, covered in urine, confused, continued slurred speech. Called EMS. Patient has not complaints. Does have slurred speech however follow commands.  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    HPI     Home Medications Prior to Admission medications   Medication Sig Start Date End Date Taking? Authorizing Provider  empagliflozin (JARDIANCE) 10 MG TABS tablet Take 1 tablet (10 mg total) by mouth daily before breakfast. 05/23/21  Yes Early Osmond, MD  fluticasone (FLONASE) 50 MCG/ACT nasal spray Place 1 spray into both nostrils daily as needed for rhinitis. 11/20/20  Yes [provider]  furosemide (LASIX) 40 MG tablet Take 1 tablet (40 mg total) by mouth daily. 05/23/21  Yes Early Osmond, MD  gabapentin (NEURONTIN) 300 MG capsule Take 2 capsules (600 mg total) by mouth at bedtime. Patient taking differently: Take 300-600 mg by mouth See admin instructions. 1cap am 1 cap at noon and 2 at hs 12/05/20  Yes Nita Sells, MD  levocetirizine (XYZAL) 5 MG tablet Take 5 mg by mouth daily as needed for allergies.   Yes [provider]  losartan (COZAAR) 25 MG tablet Take 0.5 tablets (12.5 mg total) by mouth daily. 05/23/21  Yes Early Osmond, MD  metFORMIN (GLUCOPHAGE) 1000 MG tablet Take 2,000 mg by mouth 2 (two) times daily with a meal. 03/30/19  Yes [provider]  metoprolol succinate (TOPROL-XL) 25 MG 24 hr tablet Take 1 tablet (25 mg total) by mouth  daily. 08/23/18  Yes Adrian Prows, MD  polyethylene glycol (MIRALAX / GLYCOLAX) 17 g packet Take 17 g by mouth daily as needed for mild constipation. 12/05/20  Yes Nita Sells, MD  potassium chloride (KLOR-CON) 10 MEQ tablet Take 10 mEq by mouth daily. 09/19/20  Yes [provider]  pravastatin (PRAVACHOL) 10 MG tablet Take 1 tablet (10 mg total) by mouth every evening. 02/12/17  Yes Love, Ivan Anchors, PA-C  tamsulosin (FLOMAX) 0.4 MG CAPS capsule Take 1 capsule (0.4 mg total) by mouth daily after supper. 12/05/20  Yes Nita Sells, MD  sildenafil (VIAGRA) 25 MG tablet Take 25 mg by mouth daily as needed for erectile dysfunction. Patient not taking: Reported on 07/03/2021    [provider]  albuterol (PROVENTIL) (2.5 MG/3ML) 0.083% nebulizer solution Take 3 mLs (2.5 mg total) by nebulization every 2 (two) hours as needed for wheezing. Patient not taking: Reported on 11/28/2020 03/11/17 12/01/20  Theodis Blaze, MD      Allergies    Shellfish allergy and Penicillins    Review of Systems   Review of Systems  Unable to perform ROS: Mental status change  Neurological:  Positive for speech difficulty.  Psychiatric/Behavioral:  Positive for confusion.   All other systems reviewed and are negative.  Physical Exam Updated Vital Signs BP 134/87    Pulse 80    Temp 97.7 F (36.5 C) (Oral)    Resp 13    SpO2 100%  Physical Exam  Vitals and nursing note reviewed.  Constitutional:      General: He is not in acute distress.    Appearance: He is well-developed. He is ill-appearing (chronically ill). He is not diaphoretic.  HENT:     Head: Normocephalic and atraumatic.     Nose: Nose normal.     Mouth/Throat:     Mouth: Mucous membranes are dry.  Eyes:     Pupils: Pupils are equal, round, and reactive to light.  Cardiovascular:     Rate and Rhythm: Normal rate and regular rhythm.     Pulses: Normal pulses.     Heart sounds: Normal heart sounds.  Pulmonary:     Effort:  Pulmonary effort is normal. No respiratory distress.     Breath sounds: Normal breath sounds.  Abdominal:     General: Bowel sounds are normal. There is distension.     Palpations: Abdomen is soft.     Tenderness: There is no abdominal tenderness. There is no guarding or rebound.  Musculoskeletal:     Cervical back: Normal range of motion and neck supple.     Comments: Can lift bilateral extremities off bed, bilateral lower extremities with erythema, warmth, pitting edema  Skin:    General: Skin is warm and dry.     Comments: Erythema, warmth of bilateral lower extremity to mid shin, chronic venous stasis skin changes  Neurological:     Mental Status: He is disoriented and confused.     Comments: Intermittent difficulty speaking Difficult to perform finger-nose due to impairment. Global weakness equal bilaterally, 4 out of 5 Attempts heel-to-shin however difficulty due to weakness    ED Results / Procedures / Treatments   Labs (all labs ordered are listed, but only abnormal results are displayed) Labs Reviewed  CBC WITH DIFFERENTIAL/PLATELET - Abnormal; Notable for the following components:      Result Value   RBC 3.57 (*)    Hemoglobin 11.3 (*)    HCT 35.9 (*)    MCV 100.6 (*)    All other components within normal limits  BRAIN NATRIURETIC PEPTIDE - Abnormal; Notable for the following components:   B Natriuretic Peptide 111.5 (*)    All other components within normal limits  ETHANOL - Abnormal; Notable for the following components:   Alcohol, Ethyl (B) 281 (*)    All other components within normal limits  SALICYLATE LEVEL - Abnormal; Notable for the following components:   Salicylate Lvl <3.5 (*)    All other components within normal limits  URINALYSIS, ROUTINE W REFLEX MICROSCOPIC - Abnormal; Notable for the following components:   Leukocytes,Ua MODERATE (*)    All other components within normal limits  LACTIC ACID, PLASMA - Abnormal; Notable for the following components:    Lactic Acid, Venous 2.4 (*)    All other components within normal limits  COMPREHENSIVE METABOLIC PANEL - Abnormal; Notable for the following components:   Calcium 8.3 (*)    Total Protein 5.8 (*)    Albumin 3.0 (*)    All other components within normal limits  ACETAMINOPHEN LEVEL - Abnormal; Notable for the following components:   Acetaminophen (Tylenol), Serum <10 (*)    All other components within normal limits  OSMOLALITY - Abnormal; Notable for the following components:   Osmolality 351 (*)    All other components within normal limits  URINALYSIS, MICROSCOPIC (REFLEX) - Abnormal; Notable for the following components:   Bacteria, UA MANY (*)    All other components within normal limits  CULTURE, BLOOD (ROUTINE X 2)  CULTURE, BLOOD (ROUTINE X 2)  RESP PANEL BY RT-PCR (FLU A&B, COVID) ARPGX2  AMMONIA  PROTIME-INR  CK  LACTIC ACID, PLASMA  CBG MONITORING, ED    EKG EKG Interpretation  Date/Time:  Thursday July 03 2021 10:59:46 EST Ventricular Rate:  79 PR Interval:  229 QRS Duration: 137 QT Interval:  393 QTC Calculation: 451 R Axis:   -12 Text Interpretation: Sinus rhythm Prolonged PR interval Nonspecific intraventricular conduction delay No significant change since last tracing Confirmed by Isla Pence [phone]) on 07/03/2021 11:19:39 AM  Radiology DG Chest 2 View  Result Date: 07/03/2021 CLINICAL DATA:  Confusion, lower extremity edema EXAM: CHEST - 2 VIEW COMPARISON:  Chest radiograph 11/28/2020, CTA chest 10/24/2015 FINDINGS: The heart is mildly enlarged. The mediastinum is prominent, particularly on the right, likely at least in part due to the known ascending thoracic aortic aneurysm as seen on prior CT chest from 2017. Lung volumes are low. Linear opacities in the lung bases likely reflect subsegmental atelectasis. There is no other focal consolidation. There is no significant pleural effusion. There is no pneumothorax. There is no acute osseous abnormality.  IMPRESSION: 1. Prominent mediastinal contours likely at least in part due to the ascending thoracic aortic aneurysm seen on prior CT chest from 2017, but appears increased since the most recent radiograph from 11/28/2020. Recommend CTA chest for further evaluation. 2. Low lung volumes with bibasilar subsegmental atelectasis. These results were called by telephone at the time of interpretation on 07/03/2021 at 11:37 am to provider [facility] , who verbally acknowledged these results. Electronically Signed   By: Valetta Mole M.D.   On: 07/03/2021 11:48   CT HEAD WO CONTRAST (5MM)  Result Date: 07/03/2021 CLINICAL DATA:  Delirium EXAM: CT HEAD WITHOUT CONTRAST TECHNIQUE: Contiguous axial images were obtained from the base of the skull through the vertex without intravenous contrast. RADIATION DOSE REDUCTION: This exam was performed according to the departmental dose-optimization program which includes automated exposure control, adjustment of the mA and/or kV according to patient size and/or use of iterative reconstruction technique. COMPARISON:  02/20/2021 FINDINGS: Brain: No evidence of acute infarction, hemorrhage, extra-axial collection, ventriculomegaly, or mass effect. Generalized cerebral atrophy. Periventricular white matter low attenuation likely secondary to microangiopathy. Vascular: Cerebrovascular atherosclerotic calcifications are noted. Skull: Negative for fracture or focal lesion. Sinuses/Orbits: Visualized portions of the orbits are unremarkable. Visualized portions of the paranasal sinuses are unremarkable. Visualized portions of the mastoid air cells are unremarkable. Other: None. IMPRESSION: No acute intracranial pathology. Electronically Signed   By: Kathreen Devoid M.D.   On: 07/03/2021 11:51    Procedures .Critical Care Performed by: Nettie Elm, PA-C Authorized by: Nettie Elm, PA-C   Critical care provider statement:    Critical care time (minutes):  35   Critical  care was necessary to treat or prevent imminent or life-threatening deterioration of the following conditions:  Circulatory failure, sepsis and toxidrome   Critical care was time spent personally by me on the following activities:  Development of treatment plan with patient or surrogate, discussions with consultants, evaluation of patient's response to treatment, examination of patient, ordering and review of laboratory studies, ordering and review of radiographic studies, ordering and performing treatments and interventions, pulse oximetry, re-evaluation of patient's condition and review of old charts    Medications Ordered in ED Medications  LORazepam (ATIVAN) injection 1 mg (1 mg Intravenous Given 07/03/21 1210)  iohexol (OMNIPAQUE) 350 MG/ML injection 100 mL (100 mLs  Intravenous Contrast Given 07/03/21 1300)  cefTRIAXone (ROCEPHIN) 1 g in sodium chloride 0.9 % 100 mL IVPB (0 g Intravenous Stopped 07/03/21 1509)    ED Course/ Medical Decision Making/ A&P    81 year old here for evaluation of confusion, slurred speech, found by daughter, last known normal greater than 27 hours ago.  History of EtOH abuse per history obtained by EMS and family, significant other recently passed.  Patient does follow commands however does have difficulty speaking from his baseline, slurring.  He has global weakness 4-5 strength bilaterally.  He does appear grossly fluid overloaded, has erythema, warmth of bilateral lower extremities, question venous stasis skin changes versus cellulitis?  Will perform sepsis work-up, neuro work-up.  Did review patient's chart has been admitted previously for similar, typically thought due to hyponatremia.  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 Rec consult to CT surgery DG chest with widened mediastinum EKG without STEMI   Patient reassessed, speech improved however per son in room patient confused from baseline.  Suspect confusion multifactorial, EtOH on board as well as positive UTI, questionable cellulitis to lower extremities.  Given he does appear grossly fluid overloaded, mildly elevated lactic acid we will give gentle fluid bolus.  CONSULT with Thurmond Butts with CT surgery who will have Dr. Cyndia Bent reviewed imaging. Dr. Cyndia Bent has reviewed imaging. Rec FU in 87months. Nothing to do inpatient.  CONSULT with Dr. Shanon Brow with [place] who agrees to evaluate patient for admission.  The patient appears reasonably stabilized for admission considering the current resources, flow, and capabilities available in the ED at this time, and I doubt any other [facility] requiring further screening and/or treatment in the ED prior to admission.                            Medical Decision Making Amount and/or Complexity of Data Reviewed Independent Historian: EMS    Details: Spoke with EMS, LKN > 26 hours PTA, Slurred speech no other deficets, Looks like cellulitis to BLE. Supposed ETOH use chronically. Son at bedside, patient with confusion External Data Reviewed: labs, radiology, ECG and notes.    Details: Multiple prior admission for hyponatremia Labs: ordered. Decision-making details documented in ED Course. Radiology: ordered and independent interpretation performed. Decision-making details documented in ED Course. ECG/medicine tests: ordered and independent interpretation performed. Decision-making details documented in ED Course. Discussion of management or test interpretation with external provider(s): CT surgery, [place] and Dr. Cyndia Bent Hospitalist  Risk OTC drugs. Prescription drug management. Parenteral controlled substances. Drug therapy requiring intensive monitoring for  toxicity. Decision regarding hospitalization. Diagnosis or treatment significantly limited by social determinants of health. Risk Details: Chronic Etoh abuse, poor outpatient FU and non compliance with FU/ meds  Critical Care Total time providing critical care: 30-74 minutes          Final Clinical Impression(s) / ED Diagnoses Final diagnoses:  Altered mental status, unspecified altered mental status type  ETOH abuse  Leg edema  Acute cystitis with hematuria  Dissection of descending thoracic aorta  Abdominal aortic aneurysm (AAA) without rupture, unspecified part    Rx / DC Orders ED Discharge Orders     None  Nettie Elm, PA-C 07/03/21 [place], Julie, MD 07/05/21 [phone]

## 2021-07-03 NOTE — ED Notes (Signed)
Patient observed sitting at the end of his bed, trying to leave. Daughter at bedside to encourage patient to stay. RN pressed duress button for assistance.

## 2021-07-03 NOTE — ED Notes (Signed)
Called floor for report, no answer.

## 2021-07-03 NOTE — H&P (Signed)
History and Physical    Donald Calderon GGY:[id number] DOB: [birth year] DOA: 07/03/2021  PCP: [facility], [facility]  Patient coming from: Home  Chief Complaint: Found slurred speech and confused by daughter  HPI: Donald Calderon is a 81 y.o. male with medical history significant of chronic alcohol abuse-chronic abdominal aneurysm and dissection failed to follow outpatient-diabetes was called by his daughter this morning she found that he was slurring his speech and had some confusion she called 911 at which time EMS brought him to the hospital.  Patient was altered when he arrived.  His alcohol level is over 250 possibly has a UTI.  He has no focal neurological deficits.  Patient seems more alert right now however he just lost his wife 2 months ago has been having a very difficult time dealing with this and reports he is drinking a lot more.  He drinks 1/5 of hard liquor a day.  He has a son with him right now who confirms that he has a drinking issue.  He has been falling more frequently lately.  He reports he takes his daily medications and that his daughter gives him to him every day.  Patient be referred for admission for altered mental status in the setting of alcohol abuse and possible UTI.  He is currently threatening to leave AMA.   Review of Systems: As per HPI otherwise 10 point review of systems negative.   Past Medical History:  Diagnosis Date   Abdominal aortic aneurysm    ALCOHOL ABUSE    Allergic rhinitis    Anxiety    Arthritis    Atopic dermatitis    Atopic dermatitis    BPH (benign prostatic hyperplasia)    Decreased functional mobility and endurance    Depression    Diabetes mellitus    Diabetic neuropathy (HCC)    Diabetic retinopathy (HCC)    GERD (gastroesophageal reflux disease)    Glaucoma    HTN (hypertension)    Hyperlipidemia    Hypertension    Hyponatremia    Impaired vision    Leg edema    Mild mitral valve regurgitation    Overweight     Peripheral edema    Pneumonia    Shingles    Vitamin D deficiency     Past Surgical History:  Procedure Laterality Date   BACK SURGERY      disc fro picched nerve times 2   EYE SURGERY Right    Gaucoma   LUMBAR LAMINECTOMY/DECOMPRESSION MICRODISCECTOMY N/A 01/21/2017   Procedure: Decompression Lumbar 2-3;  Surgeon: Melina Schools, MD;  Location: [place];  Service: Orthopedics;  Laterality: N/A;  210 mins   LUMBAR LAMINECTOMY/DECOMPRESSION MICRODISCECTOMY N/A 01/23/2017   Procedure: EXPLORATION AND EVACUATION OF HEMATOMA L2-L3;  Surgeon: Melina Schools, MD;  Location: [place];  Service: Orthopedics;  Laterality: N/A;   PROSTATE SURGERY       reports that he quit smoking about 23 years ago. His smoking use included cigarettes. He has a 39.00 pack-year smoking history. He quit smokeless tobacco use about 23 years ago.  His smokeless tobacco use included chew. He reports current alcohol use of about 4.0 standard drinks per week. He reports that he does not use drugs.  Allergies  Allergen Reactions   Shellfish Allergy Anaphylaxis    Throat and tongue swells   Penicillins Itching and Rash    Has patient had a PCN reaction causing immediate rash, facial/tongue/throat swelling, SOB or lightheadedness with hypotension: No Has  patient had a PCN reaction causing severe rash involving mucus membranes or skin necrosis: No Has patient had a PCN reaction that required hospitalization: No Has patient had a PCN reaction occurring within the last 10 years: No If all of the above answers are "NO", then may proceed with Cephalosporin use.  Tolerated Ancef     Family History  Problem Relation Age of Onset   Diabetes Mother    Diabetes Father     Prior to Admission medications   Medication Sig Start Date End Date Taking? Authorizing Provider  empagliflozin (JARDIANCE) 10 MG TABS tablet Take 1 tablet (10 mg total) by mouth daily before breakfast. 05/23/21  Yes Early Osmond, MD  fluticasone  (FLONASE) 50 MCG/ACT nasal spray Place 1 spray into both nostrils daily as needed for rhinitis. 11/20/20  Yes [provider]  furosemide (LASIX) 40 MG tablet Take 1 tablet (40 mg total) by mouth daily. 05/23/21  Yes Early Osmond, MD  gabapentin (NEURONTIN) 300 MG capsule Take 2 capsules (600 mg total) by mouth at bedtime. Patient taking differently: Take 300-600 mg by mouth See admin instructions. 1cap am 1 cap at noon and 2 at hs 12/05/20  Yes Nita Sells, MD  levocetirizine (XYZAL) 5 MG tablet Take 5 mg by mouth daily as needed for allergies.   Yes [provider]  losartan (COZAAR) 25 MG tablet Take 0.5 tablets (12.5 mg total) by mouth daily. 05/23/21  Yes Early Osmond, MD  metFORMIN (GLUCOPHAGE) 1000 MG tablet Take 2,000 mg by mouth 2 (two) times daily with a meal. 03/30/19  Yes [provider]  metoprolol succinate (TOPROL-XL) 25 MG 24 hr tablet Take 1 tablet (25 mg total) by mouth daily. 08/23/18  Yes Adrian Prows, MD  polyethylene glycol (MIRALAX / GLYCOLAX) 17 g packet Take 17 g by mouth daily as needed for mild constipation. 12/05/20  Yes Nita Sells, MD  potassium chloride (KLOR-CON) 10 MEQ tablet Take 10 mEq by mouth daily. 09/19/20  Yes [provider]  pravastatin (PRAVACHOL) 10 MG tablet Take 1 tablet (10 mg total) by mouth every evening. 02/12/17  Yes Love, Ivan Anchors, PA-C  tamsulosin (FLOMAX) 0.4 MG CAPS capsule Take 1 capsule (0.4 mg total) by mouth daily after supper. 12/05/20  Yes Nita Sells, MD  sildenafil (VIAGRA) 25 MG tablet Take 25 mg by mouth daily as needed for erectile dysfunction. Patient not taking: Reported on 07/03/2021    [provider]  albuterol (PROVENTIL) (2.5 MG/3ML) 0.083% nebulizer solution Take 3 mLs (2.5 mg total) by nebulization every 2 (two) hours as needed for wheezing. Patient not taking: Reported on 11/28/2020 03/11/17 12/01/20  Theodis Blaze, MD    Physical Exam: Vitals:   07/03/21  1345 07/03/21 1400 07/03/21 1430 07/03/21 1530  BP: 138/74 135/75 134/87 (!) 145/73  Pulse: 79 76 80 79  Resp: 16 14 13 14   Temp:      TempSrc:      SpO2: 100% 100% 100% 98%     Constitutional: NAD, calm, comfortable Vitals:   07/03/21 1345 07/03/21 1400 07/03/21 1430 07/03/21 1530  BP: 138/74 135/75 134/87 (!) 145/73  Pulse: 79 76 80 79  Resp: 16 14 13 14   Temp:      TempSrc:      SpO2: 100% 100% 100% 98%   Eyes: PERRL, lids and conjunctivae normal ENMT: Mucous membranes are moist. Posterior pharynx clear of any exudate or lesions.Normal dentition.  Neck: normal, supple, no masses, no thyromegaly  Respiratory: clear to auscultation bilaterally, no wheezing, no crackles. Normal respiratory effort. No accessory muscle use.  Cardiovascular: Regular rate and rhythm, no murmurs / rubs / gallops. No extremity edema. 2+ pedal pulses. No carotid bruits.  Abdomen: no tenderness, no masses palpated. No hepatosplenomegaly. Bowel sounds positive.  Musculoskeletal: no clubbing / cyanosis. No joint deformity upper and lower extremities. Good ROM, no contractures. Normal muscle tone.  Skin: no rashes, lesions, ulcers. No induration Neurologic: CN 2-12 grossly intact. Sensation intact, DTR normal. Strength 5/5 in all 4.  Psychiatric: Normal judgment and insight. Alert and oriented x 3. Normal mood.    Labs on Admission: I have personally reviewed following labs and imaging studies  CBC: Recent Labs  Lab 07/03/21 1114  WBC 5.0  NEUTROABS 2.7  HGB 11.3*  HCT 35.9*  MCV 100.6*  PLT 353   Basic Metabolic Panel: Recent Labs  Lab 07/03/21 1114  NA 137  K 4.2  CL 108  CO2 22  GLUCOSE 89  BUN 11  CREATININE 1.09  CALCIUM 8.3*   GFR: CrCl cannot be calculated (Unknown ideal weight.). Liver Function Tests: Recent Labs  Lab 07/03/21 1114  AST 25  ALT 14  ALKPHOS 45  BILITOT 0.6  PROT 5.8*  ALBUMIN 3.0*   No results for input(s): LIPASE, AMYLASE in the last 168  hours. Recent Labs  Lab 07/03/21 1117  AMMONIA 33   Coagulation Profile: Recent Labs  Lab 07/03/21 1114  INR 1.1   Cardiac Enzymes: Recent Labs  Lab 07/03/21 1114  CKTOTAL 116   BNP (last 3 results) No results for input(s): PROBNP in the last 8760 hours. HbA1C: No results for input(s): HGBA1C in the last 72 hours. CBG: No results for input(s): GLUCAP in the last 168 hours. Lipid Profile: No results for input(s): CHOL, HDL, LDLCALC, TRIG, CHOLHDL, LDLDIRECT in the last 72 hours. Thyroid Function Tests: No results for input(s): TSH, T4TOTAL, FREET4, T3FREE, THYROIDAB in the last 72 hours. Anemia Panel: No results for input(s): VITAMINB12, FOLATE, FERRITIN, TIBC, IRON, RETICCTPCT in the last 72 hours. Urine analysis:    Component Value Date/Time   COLORURINE YELLOW 07/03/2021 1300   APPEARANCEUR CLEAR 07/03/2021 1300   LABSPEC 1.010 07/03/2021 1300   PHURINE 6.5 07/03/2021 1300   GLUCOSEU NEGATIVE 07/03/2021 1300   HGBUR NEGATIVE 07/03/2021 1300   HGBUR trace-intact 08/08/2010 1216   BILIRUBINUR NEGATIVE 07/03/2021 1300   KETONESUR NEGATIVE 07/03/2021 1300   PROTEINUR NEGATIVE 07/03/2021 1300   UROBILINOGEN 0.2 09/27/2011 1540   NITRITE NEGATIVE 07/03/2021 1300   LEUKOCYTESUR MODERATE (A) 07/03/2021 1300   Sepsis Labs: !!!!!!!!!!!!!!!!!!!!!!!!!!!!!!!!!!!!!!!!!!!! @LABRCNTIP (procalcitonin:4,lacticidven:4) )No results found for this or any previous visit (from the past 240 hour(s)).   Radiological Exams on Admission: DG Chest 2 View  Result Date: 07/03/2021 CLINICAL DATA:  Confusion, lower extremity edema EXAM: CHEST - 2 VIEW COMPARISON:  Chest radiograph 11/28/2020, CTA chest 10/24/2015 FINDINGS: The heart is mildly enlarged. The mediastinum is prominent, particularly on the right, likely at least in part due to the known ascending thoracic aortic aneurysm as seen on prior CT chest from 2017. Lung volumes are low. Linear opacities in the lung bases likely reflect  subsegmental atelectasis. There is no other focal consolidation. There is no significant pleural effusion. There is no pneumothorax. There is no acute osseous abnormality. IMPRESSION: 1. Prominent mediastinal contours likely at least in part due to the ascending thoracic aortic aneurysm seen on prior CT chest from 2017, but appears increased since the most recent radiograph  from 11/28/2020. Recommend CTA chest for further evaluation. 2. Low lung volumes with bibasilar subsegmental atelectasis. These results were called by telephone at the time of interpretation on 07/03/2021 at 11:37 am to provider [facility] , who verbally acknowledged these results. Electronically Signed   By: Valetta Mole M.D.   On: 07/03/2021 11:48   CT HEAD WO CONTRAST (5MM)  Result Date: 07/03/2021 CLINICAL DATA:  Delirium EXAM: CT HEAD WITHOUT CONTRAST TECHNIQUE: Contiguous axial images were obtained from the base of the skull through the vertex without intravenous contrast. RADIATION DOSE REDUCTION: This exam was performed according to the departmental dose-optimization program which includes automated exposure control, adjustment of the mA and/or kV according to patient size and/or use of iterative reconstruction technique. COMPARISON:  02/20/2021 FINDINGS: Brain: No evidence of acute infarction, hemorrhage, extra-axial collection, ventriculomegaly, or mass effect. Generalized cerebral atrophy. Periventricular white matter low attenuation likely secondary to microangiopathy. Vascular: Cerebrovascular atherosclerotic calcifications are noted. Skull: Negative for fracture or focal lesion. Sinuses/Orbits: Visualized portions of the orbits are unremarkable. Visualized portions of the paranasal sinuses are unremarkable. Visualized portions of the mastoid air cells are unremarkable. Other: None. IMPRESSION: No acute intracranial pathology. Electronically Signed   By: Kathreen Devoid M.D.   On: 07/03/2021 11:51    Chest x-ray reviewed no  edema or infiltrate COVID screen negative Case discussed with EDP  Assessment/Plan  81 year old male with altered mental status with chronic alcohol abuse comes in with UTI and frequent falls  Principal Problem:    Acute metabolic encephalopathy-no focal neurologic deficits at this time.  CT head negative.  Suspect all due from intoxication.  Alcohol level over 250.  Patient mental status seems to be improving he is currently alert and oriented x4.  He is threatening to leave AMA.  I have discussed this with his son at the bedside who does not want to take him home and thinks he should stay in the hospital.  Will place on CIWA protocol and provide some Ativan.  This did help earlier with his agitation we will try again.  Active Problems:    Alcohol abuse-noted.  CIWA protocol.  Ativan as needed.    Chronic back pain-noted stable    UTI (urinary tract infection)-placed on IV Rocephin follow-up on urine cultures    Slurred speech-likely secondary to alcohol intoxication and infection.  Neurological exam normal.  No further work-up.  Obtain frequent neurological checks.  Frequent falls, obtain physical therapy evaluation     Further recommendations pending over all hospital course   DVT prophylaxis: SCDs ambulate Code Status: Full Family Communication: Son at bedside Disposition Plan: 1 to 3 days Consults called: None Admission status: Admission   Faten Frieson A MD [facility]  If 7PM-7AM, please contact night-coverage [email or web address] Password [email or web address]  07/03/2021, 4:11 PM

## 2021-07-03 NOTE — ED Notes (Signed)
Called floor to confirm report, RN unavailable at this time.

## 2021-07-03 NOTE — ED Triage Notes (Signed)
Per ems Patient was last talked to by his daughter yest am at 8am she states his speech was slurred however he has a problem with alcohol and though that was the problem. Today she went to check on him and found him in the bed and felt like he had been there for a  while. Upon arrival patient is confused. Oriented to name only

## 2021-07-03 NOTE — ED Notes (Signed)
Patient transported to CT 

## 2021-07-04 DIAGNOSIS — G9341 Metabolic encephalopathy: Secondary | ICD-10-CM | POA: Diagnosis not present

## 2021-07-04 DIAGNOSIS — R4781 Slurred speech: Secondary | ICD-10-CM

## 2021-07-04 DIAGNOSIS — G8929 Other chronic pain: Secondary | ICD-10-CM

## 2021-07-04 DIAGNOSIS — N390 Urinary tract infection, site not specified: Secondary | ICD-10-CM

## 2021-07-04 DIAGNOSIS — M545 Low back pain, unspecified: Secondary | ICD-10-CM | POA: Diagnosis not present

## 2021-07-04 DIAGNOSIS — F1010 Alcohol abuse, uncomplicated: Secondary | ICD-10-CM | POA: Diagnosis not present

## 2021-07-04 LAB — BASIC METABOLIC PANEL
Anion gap: 9 (ref 5–15)
BUN: 10 mg/dL (ref 8–23)
CO2: 24 mmol/L (ref 22–32)
Calcium: 7.8 mg/dL — ABNORMAL LOW (ref 8.9–10.3)
Chloride: 106 mmol/L (ref 98–111)
Creatinine, Ser: 1.09 mg/dL (ref 0.61–1.24)
GFR, Estimated: >60 mL/min (ref >60)
Glucose, Bld: 71 mg/dL (ref 70–99)
Potassium: 4 mmol/L (ref 3.5–5.1)
Sodium: 139 mmol/L (ref 135–145)

## 2021-07-04 LAB — CBC
HCT: 31.9 % — ABNORMAL LOW (ref 39.0–52.0)
Hemoglobin: 10.7 g/dL — ABNORMAL LOW (ref 13.0–17.0)
MCH: 31.5 pg (ref 26.0–34.0)
MCHC: 33.5 g/dL (ref 30.0–36.0)
MCV: 93.8 fL (ref 80.0–100.0)
Platelets: 188 10*3/uL (ref 150–400)
RBC: 3.4 MIL/uL — ABNORMAL LOW (ref 4.22–5.81)
RDW: 14.6 % (ref 11.5–15.5)
WBC: 6.7 10*3/uL (ref 4.0–10.5)
nRBC: 0 % (ref 0.0–0.2)

## 2021-07-04 LAB — GLUCOSE, CAPILLARY
Glucose-Capillary: 83 mg/dL (ref 70–99)
Glucose-Capillary: 94 mg/dL (ref 70–99)

## 2021-07-04 LAB — HEMOGLOBIN A1C
Hgb A1c MFr Bld: 6.1 % — ABNORMAL HIGH (ref 4.8–5.6)
Mean Plasma Glucose: 128 mg/dL

## 2021-07-04 MED ORDER — CEPHALEXIN 500 MG PO CAPS: 500.0000 mg | ORAL_CAPSULE | Freq: Two times a day (BID) | ORAL | 0 refills | Status: DC

## 2021-07-04 NOTE — Care Management CC44 (Signed)
Condition Code 44 Documentation Completed  Patient Details  Name: Donald Calderon MRN: [id number] Date of Birth: Jun 15, 1941   Condition Code 44 given:  Yes Patient signature on Condition Code 44 notice:  Yes Documentation of 2 MD's agreement:  Yes Code 44 added to claim:  Yes    Pollie Friar, RN 07/04/2021, 12:15 PM

## 2021-07-04 NOTE — Care Management Obs Status (Signed)
[place] NOTIFICATION   Patient Details  Name: Donald Calderon MRN: [id number] Date of Birth: 07-Dec-1940   Medicare Observation Status Notification Given:  Yes    Pollie Friar, RN 07/04/2021, 12:15 PM

## 2021-07-04 NOTE — TOC CAGE-AID Note (Signed)
Transition of Care [facility]) - CAGE-AID Screening   Patient Details  Name: Donald Calderon MRN: [id number] Date of Birth: 21-May-1941  [facility] CM/SW Contact:    Pollie Friar, RN Phone Number: 07/04/2021, 10:50 AM   Clinical Narrative: Pt freely admits to drinking but currently refuses inpatient/ outpatient alcohol counseling.   CAGE-AID Screening:    Have You Ever Felt You Ought to Cut Down on Your Drinking or Drug Use?: Yes Have People Annoyed You By Critizing Your Drinking Or Drug Use?: No Have You Felt Bad Or Guilty About Your Drinking Or Drug Use?: Yes Have You Ever Had a Drink or Used Drugs First Thing In The Morning to Steady Your Nerves or to Get Rid of a Hangover?: Yes CAGE-AID Score: 3  Substance Abuse Education Offered: Yes (pt refused)

## 2021-07-04 NOTE — Progress Notes (Incomplete)
AVS given to patient. Patient's son was called to pick him up.

## 2021-07-04 NOTE — Discharge Summary (Signed)
Physician Discharge Summary  Donald Calderon BSW:[id number] DOB: 10-28-1940 DOA: 07/03/2021  PCP: [facility]  Admit date: 07/03/2021 Discharge date: 07/04/2021  Admitted From: Home Disposition: Home   Recommendations for Outpatient Follow-up:  Follow up with PCP in 1-2 weeks Follow up with CT surgery for aortic aneurysm.  Home Health: PT Equipment/Devices: None new Discharge Condition: Stable CODE STATUS: Full Diet recommendation: Heart healthy  Brief/Interim Summary: Donald Calderon is an 81 y.o. 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 He was confused on arrival to ED by EMS, appeared intoxicated. Alcohol level was confirmed to be 250. He had urinary frequency and was started on antibiotics for UTI. Overnight his confusion lifted, speech normalized and there is no evidence of withdrawal. He reports he will consider cutting down on alcohol but does not plan to completely abstain.  Discharge Diagnoses:  Principal Problem:   Acute metabolic encephalopathy Active Problems:   Alcohol abuse   Chronic back pain   UTI (urinary tract infection)   Slurred speech  Acute toxic metabolic encephalopathy: Primary etiology of symptoms is alcohol intoxication, possibly also with UTI contributing. Symptoms have resolved and there is no evidence of alcohol withdrawal at this time.  UTI: Continue keflex empirically  Vascular abnormalities: 1. 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  2. Stable appearance of ascending thoracic aortic aneurysm measuring up to 4.6 cm on today's exam. Ascending thoracic aortic aneurysm.  3. 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   - Dr. Cyndia Bent of cardiac and thoracic surgery reviewed images, was consulted by EDP, recommended outpatient follow up in 6 months, no further testing or treatment indicated at this time. Recommend semi-annual imaging followup by CTA or MRA   Falls: Home health PT arranged for the patient.  Grief: Lost wife in Nov 2022 to brain cancer. He relates he is coping better, but still uses alcohol to this end.  - Outpatient counseling recommended.   Obesity: Estimated body mass index is 31.09 kg/m as calculated from the following:   Height as of this encounter: 5\' 9"  (1.753 m).   Weight as of this encounter: 95.5 kg.   Discharge Instructions Discharge Instructions     Diet Carb Modified   Complete by: As directed    Discharge instructions   Complete by: As directed    You were evaluated for confusion and slurred speech which appears to have resolved. You were intoxicated with alcohol, so this may have been the major culprit, though there is evidence of a urinary tract infection which we have treated with antibiotics. You can continue taking keflex (antibiotic) twice a day for 7 days to complete treatment for the UTI. Please cut down drinking to 2 or fewer drinks per day and follow up with your primary doctor in the next 1-2 weeks. Or seek medical attention sooner if your symptoms return.      Allergies as of 07/04/2021       Reactions   Shellfish Allergy Anaphylaxis   Throat and tongue swells  Penicillins Itching, Rash   ** Tolerates cephalosporins Has patient had a PCN reaction causing immediate rash, facial/tongue/throat swelling, SOB or lightheadedness with hypotension: No Has patient had a PCN reaction causing severe rash involving mucus membranes or skin necrosis: No Has patient had a PCN reaction that required hospitalization: No Has patient had a  PCN reaction occurring within the last 10 years: No If all of the above answers are "NO", then may proceed with Cephalosporin use. Tolerated Ancef        Medication List     STOP taking these medications    sildenafil 25 MG tablet Commonly known as: VIAGRA       TAKE these medications    cephALEXin 500 MG capsule Commonly known as: KEFLEX Take 1 capsule (500 mg total) by mouth 2 (two) times daily.   empagliflozin 10 MG Tabs tablet Commonly known as: Jardiance Take 1 tablet (10 mg total) by mouth daily before breakfast.   fluticasone 50 MCG/ACT nasal spray Commonly known as: FLONASE Place 1 spray into both nostrils daily as needed for rhinitis.   furosemide 40 MG tablet Commonly known as: LASIX Take 1 tablet (40 mg total) by mouth daily.   gabapentin 300 MG capsule Commonly known as: NEURONTIN Take 2 capsules (600 mg total) by mouth at bedtime. What changed:  how much to take when to take this additional instructions   levocetirizine 5 MG tablet Commonly known as: XYZAL Take 5 mg by mouth daily as needed for allergies.   losartan 25 MG tablet Commonly known as: COZAAR Take 0.5 tablets (12.5 mg total) by mouth daily.   metFORMIN 1000 MG tablet Commonly known as: GLUCOPHAGE Take 2,000 mg by mouth 2 (two) times daily with a meal.   metoprolol succinate 25 MG 24 hr tablet Commonly known as: TOPROL-XL Take 1 tablet (25 mg total) by mouth daily.   polyethylene glycol 17 g packet Commonly known as: MIRALAX / GLYCOLAX Take 17 g by mouth daily as needed for mild constipation.   potassium chloride 10 MEQ tablet Commonly known as: KLOR-CON Take 10 mEq by mouth daily.   pravastatin 10 MG tablet Commonly known as: PRAVACHOL Take 1 tablet (10 mg total) by mouth every evening.   tamsulosin 0.4 MG Caps capsule Commonly known as: FLOMAX Take 1 capsule (0.4 mg total) by mouth daily after supper.        Follow-up Information     [facility] Follow up.   Specialty: Family Medicine Contact information: [place] [address] [phone]                Allergies  Allergen Reactions   Shellfish Allergy Anaphylaxis    Throat and tongue swells   Penicillins Itching and Rash    ** Tolerates cephalosporins Has patient had a PCN reaction causing immediate rash, facial/tongue/throat swelling, SOB or lightheadedness with hypotension: No Has patient had a PCN reaction causing severe rash involving mucus membranes or skin necrosis: No Has patient had a PCN reaction that required hospitalization: No Has patient had a PCN reaction occurring within the last 10 years: No If all of the above answers are "NO", then may proceed with Cephalosporin use.  Tolerated Ancef     Consultations: Cardiac and thoracic surgery, Dr. Cyndia Bent.  Procedures/Studies: DG Chest 2 View  Result Date: 07/03/2021 CLINICAL DATA:  Confusion, lower extremity edema EXAM: CHEST - 2 VIEW COMPARISON:  Chest radiograph 11/28/2020, CTA chest 10/24/2015 FINDINGS: The heart is  mildly enlarged. The mediastinum is prominent, particularly on the right, likely at least in part due to the known ascending thoracic aortic aneurysm as seen on prior CT chest from 2017. Lung volumes are low. Linear opacities in the lung bases likely reflect subsegmental atelectasis. There is no other focal consolidation. There is no significant pleural effusion. There is no pneumothorax. There is no acute osseous abnormality. IMPRESSION: 1. Prominent mediastinal contours likely at least in part due to the ascending thoracic aortic aneurysm seen on prior CT chest from 2017, but appears increased since the most recent radiograph from 11/28/2020. Recommend CTA chest for further evaluation. 2. Low lung volumes with bibasilar subsegmental atelectasis. These results were called by telephone at the time of interpretation on 07/03/2021 at 11:37 am to provider [facility] , who  verbally acknowledged these results. Electronically Signed   By: Valetta Mole M.D.   On: 07/03/2021 11:48   CT HEAD WO CONTRAST (5MM)  Result Date: 07/03/2021 CLINICAL DATA:  Delirium EXAM: CT HEAD WITHOUT CONTRAST TECHNIQUE: Contiguous axial images were obtained from the base of the skull through the vertex without intravenous contrast. RADIATION DOSE REDUCTION: This exam was performed according to the departmental dose-optimization program which includes automated exposure control, adjustment of the mA and/or kV according to patient size and/or use of iterative reconstruction technique. COMPARISON:  02/20/2021 FINDINGS: Brain: No evidence of acute infarction, hemorrhage, extra-axial collection, ventriculomegaly, or mass effect. Generalized cerebral atrophy. Periventricular white matter low attenuation likely secondary to microangiopathy. Vascular: Cerebrovascular atherosclerotic calcifications are noted. Skull: Negative for fracture or focal lesion. Sinuses/Orbits: Visualized portions of the orbits are unremarkable. Visualized portions of the paranasal sinuses are unremarkable. Visualized portions of the mastoid air cells are unremarkable. Other: None. IMPRESSION: No acute intracranial pathology. Electronically Signed   By: Kathreen Devoid M.D.   On: 07/03/2021 11:51   CT Angio Chest/Abd/Pel for Dissection W and/or Wo Contrast  Result Date: 07/03/2021 CLINICAL DATA:  Chest pain or back pain, aortic dissection suspected fu chest xray, widened mediastinum, hx of Aneurysm, confusion, distended abd EXAM: CT ANGIOGRAPHY CHEST, ABDOMEN AND PELVIS TECHNIQUE: Non-contrast CT of the chest was initially obtained. Multidetector CT imaging through the chest, abdomen and pelvis was performed using the standard protocol during bolus administration of intravenous contrast. Multiplanar reconstructed images and MIPs were obtained and reviewed to evaluate the vascular anatomy. RADIATION DOSE REDUCTION: This exam was performed  according to the departmental dose-optimization program which includes automated exposure control, adjustment of the mA and/or kV according to patient size and/or use of iterative reconstruction technique. CONTRAST:  130mL OMNIPAQUE IOHEXOL 350 MG/ML SOLN COMPARISON:  May 2017 FINDINGS: CTA CHEST Cardiovascular: Preferential opacification of the thoracic aorta. The ascending thoracic aorta is aneurysmal up to 4.6 cm, previously measured at 4.7 cm. The supra-branch vessels are widely patent. Again seen is a focal defect in the mid descending thoracic aortic wall that results in a saccular wide necked pseudoaneurysm measuring 4.4 x 1.6 cm, similar in size when measured in a similar fashion. No surrounding inflammatory changes. Normal heart size. No pericardial effusion. Coronary artery calcifications. The pulmonary arteries are patent centrally without filling defect. Mediastinum/Nodes: No enlarged mediastinal, hilar, or axillary lymph nodes. The thyroid gland appears normal. Lungs/Pleura: No pleural effusion. No pneumothorax. Minimal biapical scarring. Scattered areas of subsegmental atelectasis in the dependent bases. No suspicious pulmonary nodules. CTA ABDOMEN AND PELVIS VASCULAR Aorta: Normal caliber aorta without aneurysm, dissection, vasculitis or significant stenosis. Celiac: Patent without evidence of aneurysm, dissection, vasculitis or  significant stenosis. SMA: Patent without evidence of aneurysm, dissection, vasculitis or significant stenosis. IMA: Patent. Renals: Both renal arteries are patent without evidence of aneurysm, dissection, vasculitis, fibromuscular dysplasia or significant stenosis. Calcific atherosclerosis/fibrofatty plaque results in mild less than 50% stenosis of the renal artery ostia. 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 Veins: No obvious venous abnormality within the limitations of this arterial phase study. NON-VASCULAR Hepatobiliary: The liver is normal in size without focal abnormality. No intrahepatic or extrahepatic biliary ductal dilation. The gallbladder appears normal. Spleen: Normal in size without focal abnormality. Pancreas: No pancreatic ductal dilatation or surrounding inflammatory changes. Adrenals/Urinary Tract: Stable appearance of right adrenal adenoma measuring 2.6 cm, and left benign lipid rich adrenal adenoma. Kidneys are normal, without renal calculi, focal lesion, or hydronephrosis. Urinary bladder is distended. Stomach/Bowel: The stomach, small bowel and large bowel are normal in caliber without abnormal wall thickening or surrounding inflammatory changes. The appendix is normal. Reproductive: Prostate gland is enlarged. Lymphatic: No enlarged lymph nodes in the abdomen or pelvis. Other: No abdominopelvic ascites. Musculoskeletal: No aggressive osseous lesions. Status post posterior lumbar 2 level fusion. The soft tissues are unremarkable. Review of the MIP images confirms the above findings. IMPRESSION: Vascular: 1. 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 If not already performed, consultation with cardiovascular surgery is recommended. 2. Stable appearance of ascending thoracic aortic aneurysm measuring up to 4.6 cm on today's exam. Ascending thoracic aortic aneurysm. Recommend semi-annual imaging followup by CTA or MRA and referral to cardiothoracic surgery if not already obtained. This recommendation follows 2010 ACCF/AHA/AATS/ACR/ASA/SCA/SCAI/SIR/STS/SVM Guidelines for the Diagnosis and Management of Patients With Thoracic Aortic Disease. Circulation. 2010; 121: O130-Q657. Aortic aneurysm NOS  (ICD10-I71.9) 3. 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 Consultation with vascular surgery is recommended. Nonvascular: 1. No acute extravascular findings in the abdomen or pelvis. 2. Stable appearance of bilateral adrenal nodules as described. Electronically Signed   By: Albin Felling M.D.   On: 07/03/2021 13:58    Subjective: Feels well, at his mental and physical baseline. Says he's leaving today. No fever or urinary symptoms at this time.   Discharge Exam: Vitals:   07/04/21 0415 07/04/21 0732  BP: (!) 175/96 (!) 170/89  Pulse: 89 85  Resp: 18 20  Temp: 98.2 F (36.8 C) 98.3 F (36.8 C)  SpO2: 100% 100%   General: Pt is alert, awake, not in acute distress Cardiovascular: RRR, S1/S2 +, no rubs, no gallops Respiratory: CTA bilaterally, no wheezing, no rhonchi Abdominal: Soft, NT, ND, bowel sounds + Extremities: No edema, no cyanosis  Labs: BNP (last 3 results) Recent Labs    11/28/20 2047 07/03/21 1115  BNP 51.5 846.9*   Basic Metabolic Panel: Recent Labs  Lab 07/03/21 1114 07/04/21 0250  NA 137 139  K 4.2 4.0  CL 108 106  CO2 22 24  GLUCOSE 89 71  BUN 11 10  CREATININE 1.09 1.09  CALCIUM 8.3* 7.8*   Liver Function Tests: Recent Labs  Lab  07/03/21 1114  AST 25  ALT 14  ALKPHOS 45  BILITOT 0.6  PROT 5.8*  ALBUMIN 3.0*   No results for input(s): LIPASE, AMYLASE in the last 168 hours. Recent Labs  Lab 07/03/21 1117  AMMONIA 33   CBC: Recent Labs  Lab 07/03/21 1114 07/04/21 0250  WBC 5.0 6.7  NEUTROABS 2.7  --   HGB 11.3* 10.7*  HCT 35.9* 31.9*  MCV 100.6* 93.8  PLT 186 188   Cardiac Enzymes: Recent Labs  Lab 07/03/21 1114  CKTOTAL 116   BNP: Invalid input(s): POCBNP CBG: Recent Labs  Lab 07/04/21 0603  GLUCAP 83   D-Dimer No results for input(s): DDIMER in the last 72 hours. Hgb  A1c No results for input(s): HGBA1C in the last 72 hours. Lipid Profile No results for input(s): CHOL, HDL, LDLCALC, TRIG, CHOLHDL, LDLDIRECT in the last 72 hours. Thyroid function studies No results for input(s): TSH, T4TOTAL, T3FREE, THYROIDAB in the last 72 hours.  Invalid input(s): FREET3 Anemia work up No results for input(s): VITAMINB12, FOLATE, FERRITIN, TIBC, IRON, RETICCTPCT in the last 72 hours. Urinalysis    Component Value Date/Time   COLORURINE YELLOW 07/03/2021 1300   APPEARANCEUR CLEAR 07/03/2021 1300   LABSPEC 1.010 07/03/2021 1300   PHURINE 6.5 07/03/2021 1300   GLUCOSEU NEGATIVE 07/03/2021 1300   HGBUR NEGATIVE 07/03/2021 1300   HGBUR trace-intact 08/08/2010 1216   BILIRUBINUR NEGATIVE 07/03/2021 1300   KETONESUR NEGATIVE 07/03/2021 1300   PROTEINUR NEGATIVE 07/03/2021 1300   UROBILINOGEN 0.2 09/27/2011 1540   NITRITE NEGATIVE 07/03/2021 1300   LEUKOCYTESUR MODERATE (A) 07/03/2021 1300    Microbiology Recent Results (from the past 240 hour(s))  Resp Panel by RT-PCR (Flu A&B, Covid) Nasopharyngeal Swab     Status: None   Collection Time: 07/03/21 10:54 AM   Specimen: Nasopharyngeal Swab; Nasopharyngeal(NP) swabs in vial transport medium  Result Value Ref Range Status   SARS Coronavirus 2 by RT PCR NEGATIVE NEGATIVE Final    Comment: (NOTE) SARS-CoV-2 target nucleic acids are NOT DETECTED.  The SARS-CoV-2 RNA is generally detectable in upper respiratory specimens during the acute phase of infection. The lowest concentration of SARS-CoV-2 viral copies this assay can detect is 138 copies/mL. A negative result does not preclude SARS-Cov-2 infection and should not be used as the sole basis for treatment or other patient management decisions. A negative result may occur with  improper specimen collection/handling, submission of specimen other than nasopharyngeal swab, presence of viral mutation(s) within the areas targeted by this assay, and inadequate number  of viral copies(<138 copies/mL). A negative result must be combined with clinical observations, patient history, and epidemiological information. The expected result is Negative.  Fact Sheet for Patients:  EntrepreneurPulse.com.au  Fact Sheet for Healthcare Providers:  IncredibleEmployment.be  This test is no t yet approved or cleared by the [place] FDA and  has been authorized for detection and/or diagnosis of SARS-CoV-2 by FDA under an Emergency Use Authorization (EUA). This EUA will remain  in effect (meaning this test can be used) for the duration of the COVID-19 declaration under Section 564(b)(1) of the Act, 21 U.S.C.section 360bbb-3(b)(1), unless the authorization is terminated  or revoked sooner.       Influenza A by PCR NEGATIVE NEGATIVE Final   Influenza B by PCR NEGATIVE NEGATIVE Final    Comment: (NOTE) The Xpert Xpress SARS-CoV-2/FLU/RSV plus assay is intended as an aid in the diagnosis of influenza from Nasopharyngeal swab specimens and should not be used as a  sole basis for treatment. Nasal washings and aspirates are unacceptable for Xpert Xpress SARS-CoV-2/FLU/RSV testing.  Fact Sheet for Patients: EntrepreneurPulse.com.au  Fact Sheet for Healthcare Providers: IncredibleEmployment.be  This test is not yet approved or cleared by the [place] FDA and has been authorized for detection and/or diagnosis of SARS-CoV-2 by FDA under an Emergency Use Authorization (EUA). This EUA will remain in effect (meaning this test can be used) for the duration of the COVID-19 declaration under Section 564(b)(1) of the Act, 21 U.S.C. section 360bbb-3(b)(1), unless the authorization is terminated or revoked.  Performed at [facility], [place] [address], [place], [place] [address]     Time coordinating discharge: Approximately 40 minutes  Patrecia Pour, MD  [facility] 07/04/2021,  9:24 AM

## 2021-07-04 NOTE — TOC Transition Note (Signed)
Transition of Care [facility]) - CM/SW Discharge Note   Patient Details  Name: Donald Calderon MRN: [id number] Date of Birth: November 22, 1940  Transition of Care [facility]) CM/SW Contact:  Pollie Friar, RN Phone Number: 07/04/2021, 11:36 AM   Clinical Narrative:    Patient is discharging home with home health services through [place]. Information on the AVS.  Pt has supervision at home with his caregiver, Kyung Rudd.  Pts daughter does his medications in a pill box for him per pt.  Kyung Rudd does the needed transportation and will provide transport home for him today.   Final next level of care: Home w Home Health Services Barriers to Discharge: No Barriers Identified   Patient Goals and CMS Choice   CMS Medicare.gov Compare Post Acute Care list provided to:: Patient Choice offered to / list presented to : Patient, Adult Children  Discharge Placement                       Discharge Plan and Services                          HH Arranged: PT, OT [facility] Agency: [place] Date [place]: 07/04/21   Representative spoke with at [facility]: [place] Interventions     Readmission Risk Interventions No flowsheet data found.

## 2021-07-04 NOTE — Evaluation (Signed)
Physical Therapy Evaluation Patient Details Name: Donald Calderon MRN: [id number] DOB: February 21, 1941 Today's Date: 07/04/2021  History of Present Illness  Donald Calderon is a 81 y.o. male with medical history significant of chronic alcohol abuse-chronic abdominal aneurysm and dissection failed to follow outpatient-diabetes, glucoma, DM retinopathy, LE edema and Shingles was called by his daughter this morning she found that he was slurring his speech and had some confusion.  Patient admitted with ETOH level over 250 and UTI.  Clinical Impression  Patient presents with decreased mobility due to weakness, decreased balance and some decreased cognition.  He lived alone with daughter and a friend to assist intermittently and mobilizes with a rollator and denies falls at home.  Currently minguard A for mobility due to balance/general weakness with infection and bedrest.  Feel should progress to home with intermittent help and follow up HHPT.  Encouraged footwear and consistency of his environment due to visual deficits.        Recommendations for follow up therapy are one component of a multi-disciplinary discharge planning process, led by the attending physician.  Recommendations may be updated based on patient status, additional functional criteria and insurance authorization.  Follow Up Recommendations Home health PT    Assistance Recommended at Discharge Intermittent Supervision/Assistance  Patient can return home with the following  A little help with walking and/or transfers;Assistance with cooking/housework    Equipment Recommendations None recommended by PT  Recommendations for Other Services       Functional Status Assessment Patient has had a recent decline in their functional status and demonstrates the ability to make significant improvements in function in a reasonable and predictable amount of time.     Precautions / Restrictions Precautions Precautions: Fall Precaution Comments:  decreased vision L>R      Mobility  Bed Mobility Overal bed mobility: Needs Assistance Bed Mobility: Supine to Sit     Supine to sit: HOB elevated, Supervision     General bed mobility comments: very slow and cues for using rail, states has hosptial bed at home    Transfers Overall transfer level: Needs assistance Equipment used: Rollator (4 wheels) Transfers: Sit to/from Stand Sit to Stand: Min guard           General transfer comment: increased time and mild posterior bias minguard for balance    Ambulation/Gait Ambulation/Gait assistance: Min guard Gait Distance (Feet): 120 Feet Assistive device: Rollator (4 wheels) Gait Pattern/deviations: Step-to pattern, Step-through pattern, Decreased stride length, Shuffle, Wide base of support, Trunk flexed       General Gait Details: uses rollator at baseline, noted L LE internal rotation and pt reports baseline as well, just has to compensate for it; cues for directions as pt unable to pathfind with decreased vision  Stairs            Wheelchair Mobility    Modified Rankin (Stroke Patients Only)       Balance Overall balance assessment: Needs assistance   Sitting balance-Leahy Scale: Good Sitting balance - Comments: mild posterior lean when cleaning up after urinary incontinence, but able to self correct Postural control: Posterior lean Standing balance support: Bilateral upper extremity supported Standing balance-Leahy Scale: Poor Standing balance comment: UE support for balance                             Pertinent Vitals/Pain Pain Assessment Pain Assessment: No/denies pain    Home Living Family/patient expects to be discharged  to:: Private residence Living Arrangements: Children;Non-relatives/Friends Available Help at Discharge: Friend(s);Family;Available PRN/intermittently Type of Home: House Home Access: Stairs to enter Entrance Stairs-Rails: Right;Left;Can reach both Entrance  Stairs-Number of Steps: 2   Home Layout: One level Home Equipment: Air cabin crew (4 wheels);Grab bars - tub/shower;Hand held shower head      Prior Function Prior Level of Function : Needs assist               ADLs Comments: help to set up pill box, Donald Calderon, friend cooks and stays during the day with him     Hand Dominance        Extremity/Trunk Assessment   Upper Extremity Assessment Upper Extremity Assessment: Generalized weakness    Lower Extremity Assessment Lower Extremity Assessment: Generalized weakness    Cervical / Trunk Assessment Cervical / Trunk Assessment: Kyphotic  Communication   Communication: HOH  Cognition Arousal/Alertness: Awake/alert Behavior During Therapy: WFL for tasks assessed/performed Overall Cognitive Status: Impaired/Different from baseline Area of Impairment: Orientation, Problem solving, Safety/judgement                 Orientation Level: Time, Situation       Safety/Judgement: Decreased awareness of deficits   Problem Solving: Slow processing, Requires verbal cues General Comments: covering for deficits at times, does fall at home per chart, but denied falls, thought it was Wednesday, but knew month and year, did not know why he was in the hosptial        General Comments General comments (skin integrity, edema, etc.): Educated on importance of footwear and maintaining his home environment due to visual deficits    Exercises     Assessment/Plan    PT Assessment All further PT needs can be met in the next venue of care  PT Problem List         PT Treatment Interventions      PT Goals (Current goals can be found in the Care Plan section)  Acute Rehab PT Goals PT Goal Formulation: All assessment and education complete, DC therapy    Frequency       Co-evaluation               AM-PAC PT "6 Clicks" Mobility  Outcome Measure Help needed turning from your back to your side while in a flat bed  without using bedrails?: None Help needed moving from lying on your back to sitting on the side of a flat bed without using bedrails?: A Little Help needed moving to and from a bed to a chair (including a wheelchair)?: A Little Help needed standing up from a chair using your arms (e.g., wheelchair or bedside chair)?: A Little Help needed to walk in hospital room?: A Little Help needed climbing 3-5 steps with a railing? : A Little 6 Click Score: 19    End of Session Equipment Utilized During Treatment: Gait belt Activity Tolerance: Patient tolerated treatment well Patient left: in chair;with call bell/phone within reach   PT Visit Diagnosis: Other abnormalities of gait and mobility (R26.89);Muscle weakness (generalized) (M62.81)    Time: 9509-3267 PT Time Calculation (min) (ACUTE ONLY): 35 min   Charges:   PT Evaluation $PT Eval Moderate Complexity: 1 Mod PT Treatments $Gait Training: 8-22 mins        Magda Kiel, PT Acute Rehabilitation Services [phone] Office:[phone] 07/04/2021   Reginia Naas 07/04/2021, 11:54 AM

## 2021-07-04 NOTE — Plan of Care (Signed)

## 2021-07-08 LAB — CULTURE, BLOOD (ROUTINE X 2)
Culture: NO GROWTH
Culture: NO GROWTH
Special Requests: ADEQUATE
Special Requests: ADEQUATE

## 2021-07-30 ENCOUNTER — Encounter: Payer: Self-pay | Admitting: Internal Medicine

## 2021-08-14 ENCOUNTER — Ambulatory Visit: Payer: Medicare HMO | Admitting: Internal Medicine

## 2021-09-10 ENCOUNTER — Ambulatory Visit: Payer: Medicare HMO | Admitting: Surgery

## 2022-05-22 ENCOUNTER — Other Ambulatory Visit: Payer: Self-pay | Admitting: Surgery

## 2022-05-22 DIAGNOSIS — I7121 Aneurysm of the ascending aorta, without rupture: Secondary | ICD-10-CM

## 2022-05-25 ENCOUNTER — Other Ambulatory Visit: Payer: Self-pay | Admitting: Internal Medicine

## 2022-06-17 ENCOUNTER — Other Ambulatory Visit: Payer: Self-pay | Admitting: Internal Medicine

## 2022-06-24 ENCOUNTER — Other Ambulatory Visit: Payer: Self-pay | Admitting: Internal Medicine

## 2022-07-08 ENCOUNTER — Encounter: Payer: Self-pay | Admitting: Surgery

## 2022-07-08 ENCOUNTER — Ambulatory Visit: Payer: Medicare HMO | Admitting: Surgery

## 2022-07-08 ENCOUNTER — Other Ambulatory Visit: Payer: Self-pay | Admitting: Internal Medicine

## 2022-07-08 ENCOUNTER — Ambulatory Visit
Admission: RE | Admit: 2022-07-08 | Discharge: 2022-07-08 | Disposition: A | Discharge status: Home / Self Care | Payer: Medicare HMO | Source: Ambulatory Visit | Attending: Surgery | Admitting: Surgery

## 2022-07-08 VITALS — BP 138/83 | HR 91 | Resp 18 | Ht 69.0 in | Wt 218.0 lb

## 2022-07-08 DIAGNOSIS — I7121 Aneurysm of the ascending aorta, without rupture: Secondary | ICD-10-CM

## 2022-07-08 MED ORDER — IOPAMIDOL (ISOVUE-370) INJECTION 76%
75.0000 mL | Freq: Once | INTRAVENOUS | Status: AC | PRN
  Administered 2022-07-08: 75 mL via INTRAVENOUS

## 2022-07-08 NOTE — Progress Notes (Signed)
HPI:  The patient is an 82 year old gentleman with history of diabetes with neuropathy and retinal disease, hypertension, and a known ascending aortic aneurysm that was measured at 4.7 cm when I last saw him in June 2017.  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  He was supposed to return in 6 months for a follow-up CTA but did not return.  He said that he he continues to feel fairly well overall.  Since I last saw him in June 2017 he said that he had spine surgery resulting in bilateral lower extremity weakness and he now has to walk with a rolling walker.  He is legally blind.  He denies any chest or back pain.  Current Outpatient Medications  Medication Sig Dispense Refill   cephALEXin (KEFLEX) 500 MG capsule Take 1 capsule (500 mg total) by mouth 2 (two) times daily. 14 capsule 0   empagliflozin (JARDIANCE) 10 MG TABS tablet TAKE 1 TABLET(10 MG) BY MOUTH DAILY BEFORE BREAKFAST 30 tablet 0   fluticasone (FLONASE) 50 MCG/ACT nasal spray Place 1 spray into both nostrils daily as needed for rhinitis.     furosemide (LASIX) 40 MG tablet Take 1 tablet (40 mg total) by mouth daily. 180 tablet 3   gabapentin (NEURONTIN) 300 MG capsule Take 2 capsules (600 mg total) by mouth at bedtime. (Patient taking differently: Take 300-600 mg by mouth See admin instructions. 1cap am 1 cap at noon and 2 at hs)     levocetirizine (XYZAL) 5 MG tablet Take 5 mg by mouth daily as needed for allergies.     losartan (COZAAR) 25 MG tablet TAKE 1/2 TABLET(12.5 MG) BY MOUTH DAILY 7 tablet 0   metFORMIN (GLUCOPHAGE) 1000 MG tablet Take 2,000 mg by mouth 2 (two) times daily with a meal.     metoprolol succinate (TOPROL-XL) 25 MG 24 hr tablet Take 1 tablet (25 mg total) by mouth daily. 90 tablet 0   polyethylene glycol (MIRALAX / GLYCOLAX) 17 g packet Take 17 g by mouth daily as needed for mild constipation.     potassium  chloride (KLOR-CON) 10 MEQ tablet Take 10 mEq by mouth daily.     pravastatin (PRAVACHOL) 10 MG tablet Take 1 tablet (10 mg total) by mouth every evening. 30 tablet 0   tamsulosin (FLOMAX) 0.4 MG CAPS capsule Take 1 capsule (0.4 mg total) by mouth daily after supper. 30 capsule 3   No current facility-administered medications for this visit.     Physical Exam: BP 138/83 (BP Location: Left Arm, Patient Position: Sitting)   Pulse 91   Resp 18   Ht '5\' 9"'$  (1.753 m)   Wt 218 lb (98.9 kg)   SpO2 96% Comment: RA  BMI 32.19 kg/m  He looks well. Cardiac exam shows a regular rate and rhythm with normal heart sounds.  There is no murmur. Lungs are clear.  Diagnostic Tests:  Narrative & Impression  CLINICAL DATA:  Aortic aneurysm suspected   EXAM: CT ANGIOGRAPHY CHEST WITH CONTRAST   TECHNIQUE: Multidetector CT imaging of the chest was performed using the standard protocol during bolus administration of intravenous contrast. Multiplanar CT image reconstructions and MIPs were obtained to evaluate the vascular anatomy.   RADIATION DOSE REDUCTION: This exam was performed according to the departmental dose-optimization program which includes automated exposure control, adjustment of the mA and/or kV according to patient size and/or use of iterative reconstruction  technique.   CONTRAST:  9m ISOVUE-370 IOPAMIDOL (ISOVUE-370) INJECTION 76%   COMPARISON:  CT chest, abdomen and pelvis dated July 03, 2021   FINDINGS: Cardiovascular: Normal heart size. Trace pericardial effusion. Severe three-vessel coronary artery calcifications.   Dilated ascending thoracic aorta, measuring to 4.5 cm, unchanged when compared with the prior exam. 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 Moderate atherosclerotic disease of the thoracic aorta. Standard three-vessel aortic arch with no  significant stenosis.   Mediastinum/Nodes: Esophagus and thyroid are unremarkable. No pathologically enlarged lymph nodes seen in the chest. No suspicious filling defects of the central pulmonary arteries.   Lungs/Pleura: Central airways are patent. Small amount of right lower lobe endobronchial debris. Mild paraseptal and centrilobular emphysema. Linear opacity with associated mild focal bronchiectasis of the right lower lobe, likely sequela of prior infection. Bibasilar atelectasis. No consolidation, pleural effusion or pneumothorax. Small solid pulmonary nodule of the right upper lobe measuring 4 mm on series 11, image 27, unchanged when compared with 2017 prior, no further follow-up imaging is recommended given greater than 1 year stability.   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 No acute abnormality.   Musculoskeletal: No chest wall abnormality. No acute or significant osseous findings.   Review of the MIP images confirms the above findings.   IMPRESSION: 1. Stable dilation of the ascending thoracic aorta, measuring up to 4.5 cm. Ascending thoracic aortic aneurysm. Recommend semi-annual imaging followup by CTA or MRA and referral to cardiothoracic surgery if not already obtained. This recommendation follows 2010 ACCF/AHA/AATS/ACR/ASA/SCA/SCAI/SIR/STS/SVM Guidelines for the Diagnosis and Management of Patients With Thoracic Aortic Disease. Circulation. 2010; 121:: L953-U023 Aortic aneurysm NOS (ICD10-I71.9) 2. 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 3. Severe three-vessel coronary artery calcifications. 4. Small amount of right lower lobe endobronchial debris, findings are likely due to aspiration. 5. Aortic Atherosclerosis (ICD10-I70.0) and Emphysema (ICD10-J43.9).     Electronically  Signed   By: LYetta GlassmanM.D.   On: 07/08/2022 14:55      Impression:  CTA of the chest shows a stable ascending thoracic aortic aneurysm with a diameter of 4.5 cm.  This is well below the surgical threshold of 5.5 cm.  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  I do not think this is a penetrating ulcer.  This does not require any treatment.  I reviewed the CT images with him and answered his questions.  I stressed the importance of continued good blood pressure control and preventing further enlargement and acute aortic dissection.  Plan:  He will return to see me in 1 year with a CTA of the chest.  I spent 15 minutes performing this established patient evaluation and > 50% of this time was spent face to face counseling and coordinating the care of this patient's aortic aneurysm.    BGaye Pollack MD [facility] [phone]

## 2022-07-20 ENCOUNTER — Other Ambulatory Visit: Payer: Self-pay | Admitting: Internal Medicine

## 2022-07-22 MED ORDER — EMPAGLIFLOZIN 10 MG PO TABS: ORAL_TABLET | ORAL | 0 refills | Status: DC

## 2022-07-31 ENCOUNTER — Other Ambulatory Visit: Payer: Self-pay | Admitting: Internal Medicine

## 2022-08-15 ENCOUNTER — Inpatient Hospital Stay (HOSPITAL_COMMUNITY)
Admission: EM | Admit: 2022-08-15 | Discharge: 2022-08-21 | DRG: 690 | Disposition: A | Discharge status: Skilled Nursing Facility | Payer: Medicare HMO | Attending: Internal Medicine | Admitting: Internal Medicine

## 2022-08-15 ENCOUNTER — Emergency Department (HOSPITAL_COMMUNITY): Payer: Medicare HMO

## 2022-08-15 DIAGNOSIS — E119 Type 2 diabetes mellitus without complications: Secondary | ICD-10-CM

## 2022-08-15 DIAGNOSIS — Z7984 Long term (current) use of oral hypoglycemic drugs: Secondary | ICD-10-CM

## 2022-08-15 DIAGNOSIS — N400 Enlarged prostate without lower urinary tract symptoms: Secondary | ICD-10-CM | POA: Diagnosis present

## 2022-08-15 DIAGNOSIS — R5381 Other malaise: Secondary | ICD-10-CM | POA: Diagnosis present

## 2022-08-15 DIAGNOSIS — Z880 Allergy status to penicillin: Secondary | ICD-10-CM

## 2022-08-15 DIAGNOSIS — Z79899 Other long term (current) drug therapy: Secondary | ICD-10-CM

## 2022-08-15 DIAGNOSIS — E876 Hypokalemia: Secondary | ICD-10-CM | POA: Diagnosis present

## 2022-08-15 DIAGNOSIS — Z1152 Encounter for screening for COVID-19: Secondary | ICD-10-CM

## 2022-08-15 DIAGNOSIS — F32A Depression, unspecified: Secondary | ICD-10-CM | POA: Diagnosis present

## 2022-08-15 DIAGNOSIS — F419 Anxiety disorder, unspecified: Secondary | ICD-10-CM | POA: Diagnosis present

## 2022-08-15 DIAGNOSIS — Z87891 Personal history of nicotine dependence: Secondary | ICD-10-CM

## 2022-08-15 DIAGNOSIS — N390 Urinary tract infection, site not specified: Secondary | ICD-10-CM | POA: Diagnosis not present

## 2022-08-15 DIAGNOSIS — E1140 Type 2 diabetes mellitus with diabetic neuropathy, unspecified: Secondary | ICD-10-CM | POA: Diagnosis present

## 2022-08-15 DIAGNOSIS — R071 Chest pain on breathing: Secondary | ICD-10-CM | POA: Diagnosis present

## 2022-08-15 DIAGNOSIS — I7121 Aneurysm of the ascending aorta, without rupture: Secondary | ICD-10-CM | POA: Diagnosis present

## 2022-08-15 DIAGNOSIS — F1011 Alcohol abuse, in remission: Secondary | ICD-10-CM

## 2022-08-15 DIAGNOSIS — E785 Hyperlipidemia, unspecified: Secondary | ICD-10-CM | POA: Diagnosis present

## 2022-08-15 DIAGNOSIS — I10 Essential (primary) hypertension: Secondary | ICD-10-CM | POA: Diagnosis present

## 2022-08-15 DIAGNOSIS — E871 Hypo-osmolality and hyponatremia: Secondary | ICD-10-CM | POA: Diagnosis present

## 2022-08-15 DIAGNOSIS — N179 Acute kidney failure, unspecified: Secondary | ICD-10-CM | POA: Diagnosis not present

## 2022-08-15 DIAGNOSIS — Z6833 Body mass index (BMI) 33.0-33.9, adult: Secondary | ICD-10-CM

## 2022-08-15 DIAGNOSIS — R079 Chest pain, unspecified: Secondary | ICD-10-CM

## 2022-08-15 DIAGNOSIS — R310 Gross hematuria: Secondary | ICD-10-CM | POA: Diagnosis present

## 2022-08-15 DIAGNOSIS — E872 Acidosis, unspecified: Secondary | ICD-10-CM | POA: Diagnosis present

## 2022-08-15 DIAGNOSIS — R197 Diarrhea, unspecified: Secondary | ICD-10-CM

## 2022-08-15 DIAGNOSIS — R0602 Shortness of breath: Secondary | ICD-10-CM

## 2022-08-15 DIAGNOSIS — T502X5A Adverse effect of carbonic-anhydrase inhibitors, benzothiadiazides and other diuretics, initial encounter: Secondary | ICD-10-CM | POA: Diagnosis present

## 2022-08-15 DIAGNOSIS — Z91013 Allergy to seafood: Secondary | ICD-10-CM

## 2022-08-15 DIAGNOSIS — E860 Dehydration: Secondary | ICD-10-CM | POA: Diagnosis present

## 2022-08-15 DIAGNOSIS — F1010 Alcohol abuse, uncomplicated: Secondary | ICD-10-CM | POA: Diagnosis present

## 2022-08-15 DIAGNOSIS — E669 Obesity, unspecified: Secondary | ICD-10-CM | POA: Diagnosis present

## 2022-08-15 DIAGNOSIS — K219 Gastro-esophageal reflux disease without esophagitis: Secondary | ICD-10-CM | POA: Diagnosis present

## 2022-08-15 DIAGNOSIS — Z833 Family history of diabetes mellitus: Secondary | ICD-10-CM

## 2022-08-15 DIAGNOSIS — R059 Cough, unspecified: Secondary | ICD-10-CM

## 2022-08-15 DIAGNOSIS — M170 Bilateral primary osteoarthritis of knee: Secondary | ICD-10-CM | POA: Diagnosis present

## 2022-08-15 DIAGNOSIS — D649 Anemia, unspecified: Secondary | ICD-10-CM | POA: Diagnosis present

## 2022-08-15 LAB — URINALYSIS, ROUTINE W REFLEX MICROSCOPIC
Bilirubin Urine: NEGATIVE
Glucose, UA: >=500 mg/dL — AB
Ketones, ur: 20 mg/dL — AB
Nitrite: NEGATIVE
Protein, ur: NEGATIVE mg/dL
Specific Gravity, Urine: 1.011 (ref 1.005–1.030)
WBC, UA: >50 WBC/hpf (ref 0–5)
pH: 5 (ref 5.0–8.0)

## 2022-08-15 LAB — BASIC METABOLIC PANEL
Anion gap: 10 (ref 5–15)
BUN: 9 mg/dL (ref 8–23)
CO2: 16 mmol/L — ABNORMAL LOW (ref 22–32)
Calcium: 6.1 mg/dL — CL (ref 8.9–10.3)
Chloride: 109 mmol/L (ref 98–111)
Creatinine, Ser: 0.97 mg/dL (ref 0.61–1.24)
GFR, Estimated: >60 mL/min (ref >60)
Glucose, Bld: 106 mg/dL — ABNORMAL HIGH (ref 70–99)
Potassium: 3 mmol/L — ABNORMAL LOW (ref 3.5–5.1)
Sodium: 135 mmol/L (ref 135–145)

## 2022-08-15 LAB — BRAIN NATRIURETIC PEPTIDE: B Natriuretic Peptide: 61.4 pg/mL (ref 0.0–100.0)

## 2022-08-15 LAB — RESP PANEL BY RT-PCR (RSV, FLU A&B, COVID)  RVPGX2
Influenza A by PCR: NEGATIVE
Influenza B by PCR: NEGATIVE
Resp Syncytial Virus by PCR: NEGATIVE
SARS Coronavirus 2 by RT PCR: NEGATIVE

## 2022-08-15 LAB — CBC WITH DIFFERENTIAL/PLATELET
Abs Immature Granulocytes: 0.08 10*3/uL — ABNORMAL HIGH (ref 0.00–0.07)
Basophils Absolute: 0 10*3/uL (ref 0.0–0.1)
Basophils Relative: 0 %
Eosinophils Absolute: 0 10*3/uL (ref 0.0–0.5)
Eosinophils Relative: 0 %
HCT: 31.5 % — ABNORMAL LOW (ref 39.0–52.0)
Hemoglobin: 10.5 g/dL — ABNORMAL LOW (ref 13.0–17.0)
Immature Granulocytes: 1 %
Lymphocytes Relative: 10 %
Lymphs Abs: 1.2 10*3/uL (ref 0.7–4.0)
MCH: 29.7 pg (ref 26.0–34.0)
MCHC: 33.3 g/dL (ref 30.0–36.0)
MCV: 89 fL (ref 80.0–100.0)
Monocytes Absolute: 1.2 10*3/uL — ABNORMAL HIGH (ref 0.1–1.0)
Monocytes Relative: 10 %
Neutro Abs: 9.8 10*3/uL — ABNORMAL HIGH (ref 1.7–7.7)
Neutrophils Relative %: 79 %
Platelets: 232 10*3/uL (ref 150–400)
RBC: 3.54 MIL/uL — ABNORMAL LOW (ref 4.22–5.81)
RDW: 13.6 % (ref 11.5–15.5)
WBC: 12.4 10*3/uL — ABNORMAL HIGH (ref 4.0–10.5)
nRBC: 0 % (ref 0.0–0.2)

## 2022-08-15 LAB — TROPONIN I (HIGH SENSITIVITY)
Troponin I (High Sensitivity): 10 ng/L (ref <18)
Troponin I (High Sensitivity): 9 ng/L (ref <18)

## 2022-08-15 LAB — MAGNESIUM: Magnesium: 1.2 mg/dL — ABNORMAL LOW (ref 1.7–2.4)

## 2022-08-15 MED ORDER — CALCIUM GLUCONATE-NACL 1-0.675 GM/50ML-% IV SOLN
1.0000 g | Freq: Once | INTRAVENOUS | Status: AC
  Administered 2022-08-15: 1000 mg via INTRAVENOUS
  Filled 2022-08-15: qty 50

## 2022-08-15 MED ORDER — IOHEXOL 350 MG/ML SOLN
75.0000 mL | Freq: Once | INTRAVENOUS | Status: AC | PRN
  Administered 2022-08-15: 75 mL via INTRAVENOUS

## 2022-08-15 MED ORDER — MAGNESIUM SULFATE 2 GM/50ML IV SOLN
2.0000 g | Freq: Once | INTRAVENOUS | Status: AC
  Administered 2022-08-15: 2 g via INTRAVENOUS
  Filled 2022-08-15: qty 50

## 2022-08-15 MED ORDER — POTASSIUM CHLORIDE CRYS ER 20 MEQ PO TBCR
40.0000 meq | EXTENDED_RELEASE_TABLET | Freq: Once | ORAL | Status: AC
  Administered 2022-08-15: 40 meq via ORAL
  Filled 2022-08-15: qty 2

## 2022-08-15 MED ORDER — SODIUM CHLORIDE 0.9 % IV SOLN
1.0000 g | Freq: Once | INTRAVENOUS | Status: AC
  Administered 2022-08-15: 1 g via INTRAVENOUS
  Filled 2022-08-15: qty 10

## 2022-08-15 NOTE — ED Provider Notes (Signed)
Received signout from previous provider, please see his note for complete H&P.  This is an 82 year old male presenting today with complaints of chest pain and shortness of breath which has been an ongoing issue for the past 2 to 3 days.  He also endorsed some productive cough.   Workup today is remarkable for an elevated white count of 12.4 but chest x-ray as well as PE study did not show any evidence of pneumonia.  No evidence of PE.  Patient does have trace left pleural effusion as well as a stable 4.5 cm ascending thoracic aortic aneurysm.  Labs remarkable for hypokalemia with potassium of 3.0, hypomagnesia with a magnesium of 1.2, very low calcium of 6.1.  Replenishment was given.  Patient has normal troponin, normal BNP.  Patient has a condom catheter and he is putting out very cloudy urine.  He did endorse having some urinary discomfort for the past few days sometimes noticing blood in his urine as well.  Urinalysis obtained and remarkable for large leukocyte Estrace, 21-50 RBC, greater than 50 WBC and many bacteria consistent with urinary tract infection.  He does not have any abdominal pain or flank pain to suggest kidney stone or pyelonephritis.  I have order for urine culture and have initiate antibiotic including Rocephin.  Since patient has low calcium of 6.1, I have ordered calcium gluconate.   -The patient was maintained on a cardiac monitor.  I personally viewed and interpreted the cardiac monitored which showed an underlying rhythm of: NSR -Imaging independently viewed and interpreted by me and I agree with radiologist's interpretation.  Result remarkable for chest CT without PE or PNA -This patient presents to the ED for concern of weakness, this involves an extensive number of treatment options, and is a complaint that carries with it a high risk of complications and morbidity.  The differential diagnosis includes PE, PNA, UTI, electrolytes derangement, viral illness -Co morbidities that  complicate the patient evaluation includes DM, HTN, alcohol abuse -Treatment includes rocephin, calcium gluconate, magnesium, potassium -Reevaluation of the patient after these medicines showed that the patient improved -PCP office notes or outside notes reviewed -Discussion with specialist [facility] Dr. Marlowe Sax who agrees to admit pt -Escalation to admission/observation considered: patient is agreeable with admission  .Critical Care  Performed by: Domenic Moras, PA-C Authorized by: Domenic Moras, PA-C   Critical care provider statement:    Critical care time (minutes):  30   Critical care was time spent personally by me on the following activities:  Development of treatment plan with patient or surrogate, discussions with consultants, evaluation of patient's response to treatment, examination of patient, ordering and review of laboratory studies, ordering and review of radiographic studies, ordering and performing treatments and interventions, pulse oximetry, re-evaluation of patient's condition and review of old charts  BP 139/70   Pulse 88   Temp 99.1 F (37.3 C) (Oral)   Resp 17   Ht '5\' 9"'$  (1.753 m)   Wt 98.9 kg   SpO2 98%   BMI 32.19 kg/m   Results for orders placed or performed during the hospital encounter of 08/15/22  Resp panel by RT-PCR (RSV, Flu A&B, Covid) Anterior Nasal Swab   Specimen: Anterior Nasal Swab  Result Value Ref Range   SARS Coronavirus 2 by RT PCR NEGATIVE NEGATIVE   Influenza A by PCR NEGATIVE NEGATIVE   Influenza B by PCR NEGATIVE NEGATIVE   Resp Syncytial Virus by PCR NEGATIVE NEGATIVE  CBC with Differential  Result Value Ref  Range   WBC 12.4 (H) 4.0 - 10.5 K/uL   RBC 3.54 (L) 4.22 - 5.81 MIL/uL   Hemoglobin 10.5 (L) 13.0 - 17.0 g/dL   HCT 31.5 (L) 39.0 - 52.0 %   MCV 89.0 80.0 - 100.0 fL   MCH 29.7 26.0 - 34.0 pg   MCHC 33.3 30.0 - 36.0 g/dL   RDW 13.6 11.5 - 15.5 %   Platelets 232 150 - 400 K/uL   nRBC 0.0 0.0 - 0.2 %   Neutrophils  Relative % 79 %   Neutro Abs 9.8 (H) 1.7 - 7.7 K/uL   Lymphocytes Relative 10 %   Lymphs Abs 1.2 0.7 - 4.0 K/uL   Monocytes Relative 10 %   Monocytes Absolute 1.2 (H) 0.1 - 1.0 K/uL   Eosinophils Relative 0 %   Eosinophils Absolute 0.0 0.0 - 0.5 K/uL   Basophils Relative 0 %   Basophils Absolute 0.0 0.0 - 0.1 K/uL   Immature Granulocytes 1 %   Abs Immature Granulocytes 0.08 (H) 0.00 - 0.07 K/uL  Basic metabolic panel  Result Value Ref Range   Sodium 135 135 - 145 mmol/L   Potassium 3.0 (L) 3.5 - 5.1 mmol/L   Chloride 109 98 - 111 mmol/L   CO2 16 (L) 22 - 32 mmol/L   Glucose, Bld 106 (H) 70 - 99 mg/dL   BUN 9 8 - 23 mg/dL   Creatinine, Ser 0.97 0.61 - 1.24 mg/dL   Calcium 6.1 (LL) 8.9 - 10.3 mg/dL   GFR, Estimated >60 >60 mL/min   Anion gap 10 5 - 15  Brain natriuretic peptide  Result Value Ref Range   B Natriuretic Peptide 61.4 0.0 - 100.0 pg/mL  Magnesium  Result Value Ref Range   Magnesium 1.2 (L) 1.7 - 2.4 mg/dL  Urinalysis, Routine w reflex microscopic -Urine, Clean Catch  Result Value Ref Range   Color, Urine YELLOW YELLOW   APPearance CLOUDY (A) CLEAR   Specific Gravity, Urine 1.011 1.005 - 1.030   pH 5.0 5.0 - 8.0   Glucose, UA >=500 (A) NEGATIVE mg/dL   Hgb urine dipstick MODERATE (A) NEGATIVE   Bilirubin Urine NEGATIVE NEGATIVE   Ketones, ur 20 (A) NEGATIVE mg/dL   Protein, ur NEGATIVE NEGATIVE mg/dL   Nitrite NEGATIVE NEGATIVE   Leukocytes,Ua LARGE (A) NEGATIVE   RBC / HPF 21-50 0 - 5 RBC/hpf   WBC, UA >50 0 - 5 WBC/hpf   Bacteria, UA MANY (A) NONE SEEN   Squamous Epithelial / HPF 0-5 0 - 5 /HPF   WBC Clumps PRESENT   Troponin I (High Sensitivity)  Result Value Ref Range   Troponin I (High Sensitivity) 10 <18 ng/L  Troponin I (High Sensitivity)  Result Value Ref Range   Troponin I (High Sensitivity) 9 <18 ng/L   CT Angio Chest PE W and/or Wo Contrast  Result Date: 08/15/2022 CLINICAL DATA:  Chest pain, shortness of breath EXAM: CT ANGIOGRAPHY CHEST  WITH CONTRAST TECHNIQUE: Multidetector CT imaging of the chest was performed using the standard protocol during bolus administration of intravenous contrast. Multiplanar CT image reconstructions and MIPs were obtained to evaluate the vascular anatomy. RADIATION DOSE REDUCTION: This exam was performed according to the departmental dose-optimization program which includes automated exposure control, adjustment of the mA and/or kV according to patient size and/or use of iterative reconstruction technique. CONTRAST:  44m OMNIPAQUE IOHEXOL 350 MG/ML SOLN COMPARISON:  07/08/2022 FINDINGS: Cardiovascular: Satisfactory opacification the bilateral pulmonary arteries to the lobar level.  No evidence of pulmonary embolism. Stable 4.5 cm ascending thoracic aortic aneurysm. 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 Atherosclerotic calcifications of the aortic arch. Heart is normal in size. Small inferior pericardial effusion, unchanged. Moderate three-vessel coronary atherosclerosis. Mediastinum/Nodes: No suspicious mediastinal lymphadenopathy. Visualized thyroid is unremarkable. Lungs/Pleura: Mild dependent atelectasis in the bilateral lower lobes. Trace left pleural effusion. No suspicious pulmonary nodules. No pneumothorax. Upper Abdomen: Visualized upper abdomen is grossly unremarkable. Musculoskeletal: Degenerative changes of the visualized thoracolumbar spine. Review of the MIP images confirms the above findings. IMPRESSION: No evidence of pulmonary embolism. Stable 4.5 cm ascending thoracic aortic aneurysm. 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 Trace left pleural effusion. Aortic Atherosclerosis (ICD10-I70.0). Electronically Signed   By: Julian Hy M.D.   On: 08/15/2022 20:22   DG Chest 2 View  Result Date: 08/15/2022 CLINICAL DATA:  chest pain EXAM: CHEST - 2 VIEW COMPARISON:  July 03, 2021, July 08, 2022  FINDINGS: The cardiomediastinal silhouette is unchanged in contour.Tortuous thoracic aorta. Atherosclerotic calcifications. No significant pleural effusion. Similar LEFT-sided pleural blunting the costophrenic border likely due to superimposed fat. No pneumothorax. Similar LEFT basilar platelike opacity most consistent with atelectasis. IMPRESSION: No acute cardiopulmonary abnormality. Electronically Signed   By: Valentino Saxon M.D.   On: 08/15/2022 14:52       Domenic Moras, PA-C 08/15/22 2242    Godfrey Pick, MD 08/16/22 [phone]

## 2022-08-15 NOTE — ED Notes (Signed)
PT to xray

## 2022-08-15 NOTE — ED Notes (Signed)
Pt cleaned and changed into clean gown and bedsheets. Pt on primafit.

## 2022-08-15 NOTE — ED Notes (Signed)
Pt given sandwich tray and water 

## 2022-08-15 NOTE — ED Provider Notes (Signed)
[facility] Provider Note   CSN: [id number] Arrival date & time: 08/15/22  1242     History  Chief Complaint  Patient presents with   Chest Pain    Donald Calderon is a 82 y.o. male.  82 year old male presents today for evaluation of shortness of breath and chest pain.  Symptoms ongoing for the past 2 to 3 days.  Started with shortness of breath, productive cough.  2 days ago he developed chest pain.  States it is worse with deep breathing and coughing.  No sick desiccant cardiac past medical history.  Denies CHF.  Denies COPD.  Denies peripheral edema, orthopnea, PND.  Son at bedside who adds to the history.  No recent sick contacts.  Patient was given dose of nitro and route with some improvement in pain.  Received the aspirin load prior to arrival.  Denies recent long travel, history of cancer, recent surgery.  No prior history of DVT or PE.  The history is provided by the patient and a relative. No language interpreter was used.       Home Medications Prior to Admission medications   Medication Sig Start Date End Date Taking? Authorizing Provider  cephALEXin (KEFLEX) 500 MG capsule Take 1 capsule (500 mg total) by mouth 2 (two) times daily. 07/04/21   Patrecia Pour, MD  empagliflozin (JARDIANCE) 10 MG TABS tablet TAKE 1 TABLET(10 MG) BY MOUTH DAILY BEFORE BREAKFAST 07/22/22   Early Osmond, MD  fluticasone (FLONASE) 50 MCG/ACT nasal spray Place 1 spray into both nostrils daily as needed for rhinitis. 11/20/20   [provider]  furosemide (LASIX) 40 MG tablet TAKE 1 TABLET BY MOUTH DAILY 07/09/22   Chandrasekhar, Mahesh A, MD  gabapentin (NEURONTIN) 300 MG capsule Take 2 capsules (600 mg total) by mouth at bedtime. Patient taking differently: Take 300-600 mg by mouth See admin instructions. 1cap am 1 cap at noon and 2 at hs 12/05/20   Nita Sells, MD  levocetirizine (XYZAL) 5 MG tablet Take 5 mg by mouth daily as needed for  allergies.    [provider]  losartan (COZAAR) 25 MG tablet TAKE 1/2 TABLET(12.5 MG) BY MOUTH DAILY 07/22/22   Early Osmond, MD  metFORMIN (GLUCOPHAGE) 1000 MG tablet Take 2,000 mg by mouth 2 (two) times daily with a meal. 03/30/19   [provider]  metoprolol succinate (TOPROL-XL) 25 MG 24 hr tablet Take 1 tablet (25 mg total) by mouth daily. 08/23/18   Adrian Prows, MD  polyethylene glycol (MIRALAX / GLYCOLAX) 17 g packet Take 17 g by mouth daily as needed for mild constipation. 12/05/20   Nita Sells, MD  potassium chloride (KLOR-CON) 10 MEQ tablet Take 10 mEq by mouth daily. 09/19/20   [provider]  pravastatin (PRAVACHOL) 10 MG tablet Take 1 tablet (10 mg total) by mouth every evening. 02/12/17   Love, Ivan Anchors, PA-C  tamsulosin (FLOMAX) 0.4 MG CAPS capsule Take 1 capsule (0.4 mg total) by mouth daily after supper. 12/05/20   Nita Sells, MD  albuterol (PROVENTIL) (2.5 MG/3ML) 0.083% nebulizer solution Take 3 mLs (2.5 mg total) by nebulization every 2 (two) hours as needed for wheezing. Patient not taking: Reported on 11/28/2020 03/11/17 12/01/20  Theodis Blaze, MD      Allergies    Shellfish allergy and Penicillins    Review of Systems   Review of Systems  Constitutional:  Negative for chills and fever.  Respiratory:  Positive for cough and shortness of breath.   Cardiovascular:  Positive for chest pain. Negative for palpitations and leg swelling.  Neurological:  Negative for syncope, light-headedness and headaches.  All other systems reviewed and are negative.   Physical Exam Updated Vital Signs BP 111/70 (BP Location: Right Arm)   Pulse 85   Temp 99 F (37.2 C) (Oral)   Resp 16   Ht '5\' 9"'$  (1.753 m)   Wt 98.9 kg   SpO2 100%   BMI 32.19 kg/m  Physical Exam Vitals and nursing note reviewed.  Constitutional:      General: He is not in acute distress.    Appearance: Normal appearance. He is not ill-appearing.  HENT:     Head:  Normocephalic and atraumatic.     Nose: Nose normal.  Eyes:     General: No scleral icterus.    Extraocular Movements: Extraocular movements intact.     Conjunctiva/sclera: Conjunctivae normal.  Cardiovascular:     Rate and Rhythm: Normal rate and regular rhythm.     Pulses: Normal pulses.  Pulmonary:     Effort: Pulmonary effort is normal. No respiratory distress.     Breath sounds: Normal breath sounds. No wheezing or rales.  Abdominal:     General: There is no distension.     Tenderness: There is no abdominal tenderness.  Musculoskeletal:        General: Normal range of motion.     Cervical back: Normal range of motion.     Right lower leg: No edema.     Left lower leg: No edema.  Skin:    General: Skin is warm and dry.  Neurological:     General: No focal deficit present.     Mental Status: He is alert. Mental status is at baseline.     ED Results / Procedures / Treatments   Labs (all labs ordered are listed, but only abnormal results are displayed) Labs Reviewed  CBC WITH DIFFERENTIAL/PLATELET  BASIC METABOLIC PANEL  BRAIN NATRIURETIC PEPTIDE  MAGNESIUM  TROPONIN I (HIGH SENSITIVITY)    EKG EKG Interpretation  Date/Time:  Saturday August 15 2022 12:57:48 EST Ventricular Rate:  89 PR Interval:  212 QRS Duration: 87 QT Interval:  342 QTC Calculation: 417 R Axis:   9 Text Interpretation: Sinus rhythm Borderline prolonged PR interval Low voltage, extremity and precordial leads Confirmed by Lajean Saver [phone]) on 08/15/2022 1:38:44 PM  Radiology DG Chest 2 View  Result Date: 08/15/2022 CLINICAL DATA:  chest pain EXAM: CHEST - 2 VIEW COMPARISON:  July 03, 2021, July 08, 2022 FINDINGS: The cardiomediastinal silhouette is unchanged in contour.Tortuous thoracic aorta. Atherosclerotic calcifications. No significant pleural effusion. Similar LEFT-sided pleural blunting the costophrenic border likely due to superimposed fat. No pneumothorax. Similar LEFT basilar  platelike opacity most consistent with atelectasis. IMPRESSION: No acute cardiopulmonary abnormality. Electronically Signed   By: Valentino Saxon M.D.   On: 08/15/2022 14:52    Procedures Procedures    Medications Ordered in ED Medications - No data to display  ED Course/ Medical Decision Making/ A&P                             Medical Decision Making Amount and/or Complexity of Data Reviewed Labs: ordered. Radiology: ordered.   Medical Decision Making / ED Course   This patient presents to the ED for concern of chest pain, shortness of breath, this involves an extensive number of treatment  options, and is a complaint that carries with it a high risk of complications and morbidity.  The differential diagnosis includes pneumonia, ACS, PE, CHF exacerbation, COPD exacerbation, viral URI  MDM: 82 year old male presents with above-mentioned complaints.  Overall he is well-appearing.  Does have a cough during exam.  No significant history of cardiac disease.  However his pain did improve with nitroglycerin.  Will obtain ACS workup, chest x-ray, EKG.  X-ray without evidence of pneumonia or other acute cardiopulmonary process.  Blood work still pending.  Given the pleuritic nature of the pain although he is low risk for PE on Wells criteria will order CTA PE study to further evaluate.  This will also evaluate for any subtle pneumonia that an x-ray might not of picked up on.  Patient at the end of my shift awaiting blood work, as well as PE study.  Signed out to oncoming provider Jaquita Folds.   Lab Tests: -I ordered, reviewed, and interpreted labs.   The pertinent results include:   Labs Reviewed  CBC WITH DIFFERENTIAL/PLATELET  BASIC METABOLIC PANEL  BRAIN NATRIURETIC PEPTIDE  MAGNESIUM  TROPONIN I (HIGH SENSITIVITY)      EKG  EKG Interpretation  Date/Time:  Saturday August 15 2022 12:57:48 EST Ventricular Rate:  89 PR Interval:  212 QRS Duration: 87 QT Interval:  342 QTC  Calculation: 417 R Axis:   9 Text Interpretation: Sinus rhythm Borderline prolonged PR interval Low voltage, extremity and precordial leads Confirmed by Lajean Saver [phone]) on 08/15/2022 1:38:44 PM         Imaging Studies ordered: I ordered imaging studies including CXR.  CTA chest PE study ordered but not resulted and given my shift. I independently visualized and interpreted imaging. I agree with the radiologist interpretation   Medicines ordered and prescription drug management: No orders of the defined types were placed in this encounter.   -I have reviewed the patients home medicines and have made adjustments as needed  Reevaluation: After the interventions noted above, I reevaluated the patient and found that they have :improved  Co morbidities that complicate the patient evaluation  Past Medical History:  Diagnosis Date   Abdominal aortic aneurysm (HCC)    ALCOHOL ABUSE    Allergic rhinitis    Anxiety    Arthritis    Atopic dermatitis    Atopic dermatitis    BPH (benign prostatic hyperplasia)    Decreased functional mobility and endurance    Depression    Diabetes mellitus    Diabetic neuropathy (HCC)    Diabetic retinopathy (HCC)    GERD (gastroesophageal reflux disease)    Glaucoma    HTN (hypertension)    Hyperlipidemia    Hypertension    Hyponatremia    Impaired vision    Leg edema    Mild mitral valve regurgitation    Overweight    Peripheral edema    Pneumonia    Shingles    Vitamin D deficiency       Dispostion: Patient signed out to oncoming provider.  Final Clinical Impression(s) / ED Diagnoses Final diagnoses:  None    Rx / DC Orders ED Discharge Orders     None         Evlyn Courier, PA-C 08/15/22 1534    Lajean Saver, MD 08/16/22 1210

## 2022-08-15 NOTE — ED Triage Notes (Addendum)
Pt BIB EMS from home for x3 days of tight chest pain and SOB at rest that worsens when laying flat. Pt denies radiating pain & N/V. Pt reported SOB while laying flat and presents with productive cough. Pt took ASA at home, unknown dose. EMS gave 1 nitro which relived some CP and reduce SBP from 110 to 93/63. Pt is blind, A&Ox4, NAD at this time.

## 2022-08-16 ENCOUNTER — Encounter (HOSPITAL_COMMUNITY): Payer: Self-pay | Admitting: Internal Medicine

## 2022-08-16 ENCOUNTER — Other Ambulatory Visit: Payer: Self-pay

## 2022-08-16 DIAGNOSIS — F32A Depression, unspecified: Secondary | ICD-10-CM | POA: Diagnosis present

## 2022-08-16 DIAGNOSIS — T502X5A Adverse effect of carbonic-anhydrase inhibitors, benzothiadiazides and other diuretics, initial encounter: Secondary | ICD-10-CM | POA: Diagnosis present

## 2022-08-16 DIAGNOSIS — F1011 Alcohol abuse, in remission: Secondary | ICD-10-CM | POA: Diagnosis not present

## 2022-08-16 DIAGNOSIS — E871 Hypo-osmolality and hyponatremia: Secondary | ICD-10-CM | POA: Diagnosis present

## 2022-08-16 DIAGNOSIS — I10 Essential (primary) hypertension: Secondary | ICD-10-CM | POA: Diagnosis present

## 2022-08-16 DIAGNOSIS — I7121 Aneurysm of the ascending aorta, without rupture: Secondary | ICD-10-CM | POA: Diagnosis present

## 2022-08-16 DIAGNOSIS — E872 Acidosis, unspecified: Secondary | ICD-10-CM

## 2022-08-16 DIAGNOSIS — Z833 Family history of diabetes mellitus: Secondary | ICD-10-CM | POA: Diagnosis not present

## 2022-08-16 DIAGNOSIS — D649 Anemia, unspecified: Secondary | ICD-10-CM | POA: Diagnosis present

## 2022-08-16 DIAGNOSIS — R197 Diarrhea, unspecified: Secondary | ICD-10-CM

## 2022-08-16 DIAGNOSIS — R5381 Other malaise: Secondary | ICD-10-CM | POA: Diagnosis present

## 2022-08-16 DIAGNOSIS — R079 Chest pain, unspecified: Secondary | ICD-10-CM | POA: Diagnosis not present

## 2022-08-16 DIAGNOSIS — E876 Hypokalemia: Secondary | ICD-10-CM | POA: Diagnosis present

## 2022-08-16 DIAGNOSIS — N179 Acute kidney failure, unspecified: Secondary | ICD-10-CM | POA: Diagnosis not present

## 2022-08-16 DIAGNOSIS — N390 Urinary tract infection, site not specified: Secondary | ICD-10-CM | POA: Diagnosis present

## 2022-08-16 DIAGNOSIS — R071 Chest pain on breathing: Secondary | ICD-10-CM | POA: Diagnosis present

## 2022-08-16 DIAGNOSIS — N400 Enlarged prostate without lower urinary tract symptoms: Secondary | ICD-10-CM | POA: Diagnosis present

## 2022-08-16 DIAGNOSIS — E860 Dehydration: Secondary | ICD-10-CM | POA: Diagnosis present

## 2022-08-16 DIAGNOSIS — R051 Acute cough: Secondary | ICD-10-CM | POA: Diagnosis not present

## 2022-08-16 DIAGNOSIS — M170 Bilateral primary osteoarthritis of knee: Secondary | ICD-10-CM | POA: Diagnosis present

## 2022-08-16 DIAGNOSIS — F1010 Alcohol abuse, uncomplicated: Secondary | ICD-10-CM | POA: Diagnosis present

## 2022-08-16 DIAGNOSIS — E669 Obesity, unspecified: Secondary | ICD-10-CM | POA: Diagnosis present

## 2022-08-16 DIAGNOSIS — R0602 Shortness of breath: Secondary | ICD-10-CM

## 2022-08-16 DIAGNOSIS — E1140 Type 2 diabetes mellitus with diabetic neuropathy, unspecified: Secondary | ICD-10-CM | POA: Diagnosis present

## 2022-08-16 DIAGNOSIS — E785 Hyperlipidemia, unspecified: Secondary | ICD-10-CM | POA: Diagnosis present

## 2022-08-16 DIAGNOSIS — R059 Cough, unspecified: Secondary | ICD-10-CM

## 2022-08-16 DIAGNOSIS — Z1152 Encounter for screening for COVID-19: Secondary | ICD-10-CM | POA: Diagnosis not present

## 2022-08-16 HISTORY — DX: Chest pain, unspecified: R07.9

## 2022-08-16 LAB — CBC
HCT: 33.8 % — ABNORMAL LOW (ref 39.0–52.0)
Hemoglobin: 11.3 g/dL — ABNORMAL LOW (ref 13.0–17.0)
MCH: 28.9 pg (ref 26.0–34.0)
MCHC: 33.4 g/dL (ref 30.0–36.0)
MCV: 86.4 fL (ref 80.0–100.0)
Platelets: 243 10*3/uL (ref 150–400)
RBC: 3.91 MIL/uL — ABNORMAL LOW (ref 4.22–5.81)
RDW: 13.6 % (ref 11.5–15.5)
WBC: 12.3 10*3/uL — ABNORMAL HIGH (ref 4.0–10.5)
nRBC: 0 % (ref 0.0–0.2)

## 2022-08-16 LAB — GASTROINTESTINAL PANEL BY PCR, STOOL (REPLACES STOOL CULTURE)

## 2022-08-16 LAB — COMPREHENSIVE METABOLIC PANEL
ALT: 8 U/L (ref 0–44)
AST: 11 U/L — ABNORMAL LOW (ref 15–41)
Albumin: 2.5 g/dL — ABNORMAL LOW (ref 3.5–5.0)
Alkaline Phosphatase: 48 U/L (ref 38–126)
Anion gap: 13 (ref 5–15)
BUN: 11 mg/dL (ref 8–23)
CO2: 20 mmol/L — ABNORMAL LOW (ref 22–32)
Calcium: 8.8 mg/dL — ABNORMAL LOW (ref 8.9–10.3)
Chloride: 96 mmol/L — ABNORMAL LOW (ref 98–111)
Creatinine, Ser: 1.29 mg/dL — ABNORMAL HIGH (ref 0.61–1.24)
GFR, Estimated: 56 mL/min — ABNORMAL LOW (ref >60)
Glucose, Bld: 136 mg/dL — ABNORMAL HIGH (ref 70–99)
Potassium: 3.7 mmol/L (ref 3.5–5.1)
Sodium: 129 mmol/L — ABNORMAL LOW (ref 135–145)
Total Bilirubin: 1 mg/dL (ref 0.3–1.2)
Total Protein: 6.4 g/dL — ABNORMAL LOW (ref 6.5–8.1)

## 2022-08-16 LAB — GLUCOSE, CAPILLARY
Glucose-Capillary: 178 mg/dL — ABNORMAL HIGH (ref 70–99)
Glucose-Capillary: 195 mg/dL — ABNORMAL HIGH (ref 70–99)
Glucose-Capillary: 157 mg/dL — ABNORMAL HIGH (ref 70–99)
Glucose-Capillary: 142 mg/dL — ABNORMAL HIGH (ref 70–99)

## 2022-08-16 LAB — TROPONIN I (HIGH SENSITIVITY)
Troponin I (High Sensitivity): 9 ng/L (ref <18)
Troponin I (High Sensitivity): 8 ng/L (ref <18)

## 2022-08-16 LAB — CBG MONITORING, ED: Glucose-Capillary: 172 mg/dL — ABNORMAL HIGH (ref 70–99)

## 2022-08-16 LAB — MAGNESIUM: Magnesium: 2.6 mg/dL — ABNORMAL HIGH (ref 1.7–2.4)

## 2022-08-16 LAB — C DIFFICILE QUICK SCREEN W PCR REFLEX
C Diff antigen: NEGATIVE
C Diff interpretation: NOT DETECTED
C Diff toxin: NEGATIVE

## 2022-08-16 MED ORDER — SODIUM CHLORIDE 0.9 % IV SOLN
1.0000 g | INTRAVENOUS | Status: DC
  Administered 2022-08-16 – 2022-08-20 (×5): 1 g via INTRAVENOUS
  Filled 2022-08-16 (×5): qty 10

## 2022-08-16 MED ORDER — GABAPENTIN 300 MG PO CAPS
600.0000 mg | ORAL_CAPSULE | Freq: Every day | ORAL | Status: DC
  Administered 2022-08-17 – 2022-08-20 (×5): 600 mg via ORAL
  Filled 2022-08-16 (×5): qty 2

## 2022-08-16 MED ORDER — NITROGLYCERIN 0.4 MG SL SUBL
0.4000 mg | SUBLINGUAL_TABLET | SUBLINGUAL | Status: DC | PRN
  Administered 2022-08-16 (×2): 0.4 mg via SUBLINGUAL

## 2022-08-16 MED ORDER — GUAIFENESIN-DM 100-10 MG/5ML PO SYRP
5.0000 mL | ORAL_SOLUTION | ORAL | Status: DC | PRN
  Administered 2022-08-16: 5 mL via ORAL
  Filled 2022-08-16 (×2): qty 5

## 2022-08-16 MED ORDER — ACETAMINOPHEN 325 MG PO TABS
650.0000 mg | ORAL_TABLET | Freq: Four times a day (QID) | ORAL | Status: DC | PRN
  Administered 2022-08-16 – 2022-08-18 (×5): 650 mg via ORAL
  Filled 2022-08-16 (×6): qty 2

## 2022-08-16 MED ORDER — SODIUM CHLORIDE 0.9 % IV SOLN: INTRAVENOUS | Status: DC

## 2022-08-16 MED ORDER — MAGNESIUM SULFATE 2 GM/50ML IV SOLN
2.0000 g | Freq: Once | INTRAVENOUS | Status: AC
  Administered 2022-08-16: 2 g via INTRAVENOUS
  Filled 2022-08-16: qty 50

## 2022-08-16 MED ORDER — INSULIN ASPART 100 UNIT/ML IJ SOLN
0.0000 [IU] | Freq: Every day | INTRAMUSCULAR | Status: DC
  Administered 2022-08-18 – 2022-08-19 (×2): 4 [IU] via SUBCUTANEOUS

## 2022-08-16 MED ORDER — HEPARIN SODIUM (PORCINE) 5000 UNIT/ML IJ SOLN: 5000.0000 [IU] | Freq: Three times a day (TID) | INTRAMUSCULAR | Status: DC

## 2022-08-16 MED ORDER — ALUM & MAG HYDROXIDE-SIMETH 200-200-20 MG/5ML PO SUSP
30.0000 mL | Freq: Four times a day (QID) | ORAL | Status: DC | PRN
  Administered 2022-08-16 – 2022-08-17 (×2): 30 mL via ORAL
  Filled 2022-08-16 (×2): qty 30

## 2022-08-16 MED ORDER — SODIUM CHLORIDE 0.9 % IV SOLN
INTRAVENOUS | Status: DC
  Administered 2022-08-16: 1000 mL via INTRAVENOUS

## 2022-08-16 MED ORDER — ACETAMINOPHEN 650 MG RE SUPP: 650.0000 mg | Freq: Four times a day (QID) | RECTAL | Status: DC | PRN

## 2022-08-16 MED ORDER — NITROGLYCERIN 0.4 MG SL SUBL
SUBLINGUAL_TABLET | SUBLINGUAL | Status: AC
  Filled 2022-08-16: qty 1

## 2022-08-16 MED ORDER — INSULIN ASPART 100 UNIT/ML IJ SOLN
0.0000 [IU] | Freq: Three times a day (TID) | INTRAMUSCULAR | Status: DC
  Administered 2022-08-16 – 2022-08-17 (×4): 2 [IU] via SUBCUTANEOUS
  Administered 2022-08-17 (×2): 1 [IU] via SUBCUTANEOUS
  Administered 2022-08-18: 3 [IU] via SUBCUTANEOUS
  Administered 2022-08-18: 1 [IU] via SUBCUTANEOUS
  Administered 2022-08-18: 3 [IU] via SUBCUTANEOUS
  Administered 2022-08-19: 1 [IU] via SUBCUTANEOUS
  Administered 2022-08-19: 5 [IU] via SUBCUTANEOUS
  Administered 2022-08-19: 3 [IU] via SUBCUTANEOUS
  Administered 2022-08-20: 2 [IU] via SUBCUTANEOUS
  Administered 2022-08-20: 3 [IU] via SUBCUTANEOUS
  Administered 2022-08-20: 5 [IU] via SUBCUTANEOUS
  Administered 2022-08-21: 3 [IU] via SUBCUTANEOUS
  Administered 2022-08-21: 2 [IU] via SUBCUTANEOUS

## 2022-08-16 MED ORDER — ALUM & MAG HYDROXIDE-SIMETH 200-200-20 MG/5ML PO SUSP: 30.0000 mL | Freq: Once | ORAL | Status: AC

## 2022-08-16 NOTE — H&P (Signed)
History and Physical    Donald Calderon [id number] DOB: 1940/07/09 DOA: 08/15/2022  PCP: [facility], [facility]  Patient coming from: Home  Chief Complaint: Chest pain  HPI: Donald Calderon is a 82 y.o. 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 Cyndia Bent, type 2 diabetes with neuropathy, hypertension, hyperlipidemia, obesity (BMI 33.19), BPH, anxiety, depression, GERD presented to the ED via EMS with chest pain and shortness of breath.  EMS gave him 1 nitro after which his pain improved.  On arrival to the ED, vital signs stable.  Labs significant for WBC 12.4, hemoglobin 10.5 (stable), potassium 3.0, bicarb 16, anion gap 10, glucose 106, calcium 6.1, albumin level not checked, BNP normal, troponin negative x 2, magnesium 1.2, COVID/influenza/RSV PCR negative.  UA with signs of infection (large amount of leukocytes and microscopy showing >50 WBCs and many bacteria).  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  Trace left pleural effusion seen. Patient received oral potassium 40 mEq, IV calcium gluconate 1 g, ceftriaxone, and IV magnesium 2 g.  TRH called to admit.  Patient reports 3-day history of chest pain, shortness of breath, and cough.  He was having substernal pressure-like chest pain which was worse when taking deep breaths.  Chest pain resolved after he received nitroglycerin by EMS.  Also endorsing dysuria, urinary frequency and urgency.  He is not sure when his urinary symptoms started.  Reports blood clots in his urine.  Denies abdominal pain or vomiting.  He is endorsing diarrhea.  States he stopped drinking alcohol 2 years ago.  Review of Systems:  Review of Systems  All other systems reviewed and are negative.   Past  Medical History:  Diagnosis Date   Abdominal aortic aneurysm (HCC)    ALCOHOL ABUSE    Allergic rhinitis    Anxiety    Arthritis    Atopic dermatitis    Atopic dermatitis    BPH (benign prostatic hyperplasia)    Decreased functional mobility and endurance    Depression    Diabetes mellitus    Diabetic neuropathy (HCC)    Diabetic retinopathy (HCC)    GERD (gastroesophageal reflux disease)    Glaucoma    HTN (hypertension)    Hyperlipidemia    Hypertension    Hyponatremia    Impaired vision    Leg edema    Mild mitral valve regurgitation    Overweight    Peripheral edema    Pneumonia    Shingles    Vitamin D deficiency     Past Surgical History:  Procedure Laterality Date   BACK SURGERY      disc fro picched nerve times 2   EYE SURGERY Right    Gaucoma   LUMBAR LAMINECTOMY/DECOMPRESSION MICRODISCECTOMY N/A 01/21/2017   Procedure: Decompression Lumbar 2-3;  Surgeon: Melina Schools, MD;  Location: [place];  Service: Orthopedics;  Laterality: N/A;  210 mins   LUMBAR LAMINECTOMY/DECOMPRESSION MICRODISCECTOMY N/A 01/23/2017   Procedure: EXPLORATION AND EVACUATION OF HEMATOMA L2-L3;  Surgeon: Melina Schools, MD;  Location: [place];  Service: Orthopedics;  Laterality: N/A;   PROSTATE SURGERY       reports that he quit smoking about 24 years ago. His smoking use included cigarettes. He has a 39.00 pack-year smoking history. He quit smokeless tobacco use about  24 years ago.  His smokeless tobacco use included chew. He reports current alcohol use of about 4.0 standard drinks of alcohol per week. He reports that he does not use drugs.  Allergies  Allergen Reactions   Shellfish Allergy Anaphylaxis    Throat and tongue swells   Penicillins Itching and Rash    ** Tolerates cephalosporins Has patient had a PCN reaction causing immediate rash, facial/tongue/throat swelling, SOB or lightheadedness with hypotension: No Has patient had a PCN reaction causing severe rash involving mucus  membranes or skin necrosis: No Has patient had a PCN reaction that required hospitalization: No Has patient had a PCN reaction occurring within the last 10 years: No If all of the above answers are "NO", then may proceed with Cephalosporin use.  Tolerated Ancef     Family History  Problem Relation Age of Onset   Diabetes Mother    Diabetes Father     Prior to Admission medications   Medication Sig Start Date End Date Taking? Authorizing Provider  cephALEXin (KEFLEX) 500 MG capsule Take 1 capsule (500 mg total) by mouth 2 (two) times daily. 07/04/21   Patrecia Pour, MD  empagliflozin (JARDIANCE) 10 MG TABS tablet TAKE 1 TABLET(10 MG) BY MOUTH DAILY BEFORE BREAKFAST 07/22/22   Early Osmond, MD  fluticasone (FLONASE) 50 MCG/ACT nasal spray Place 1 spray into both nostrils daily as needed for rhinitis. 11/20/20   [provider]  furosemide (LASIX) 40 MG tablet TAKE 1 TABLET BY MOUTH DAILY 07/09/22   Chandrasekhar, Mahesh A, MD  gabapentin (NEURONTIN) 300 MG capsule Take 2 capsules (600 mg total) by mouth at bedtime. Patient taking differently: Take 300-600 mg by mouth See admin instructions. 1cap am 1 cap at noon and 2 at hs 12/05/20   Nita Sells, MD  levocetirizine (XYZAL) 5 MG tablet Take 5 mg by mouth daily as needed for allergies.    [provider]  losartan (COZAAR) 25 MG tablet TAKE 1/2 TABLET(12.5 MG) BY MOUTH DAILY 07/22/22   Early Osmond, MD  metFORMIN (GLUCOPHAGE) 1000 MG tablet Take 2,000 mg by mouth 2 (two) times daily with a meal. 03/30/19   [provider]  metoprolol succinate (TOPROL-XL) 25 MG 24 hr tablet Take 1 tablet (25 mg total) by mouth daily. 08/23/18   Adrian Prows, MD  polyethylene glycol (MIRALAX / GLYCOLAX) 17 g packet Take 17 g by mouth daily as needed for mild constipation. 12/05/20   Nita Sells, MD  potassium chloride (KLOR-CON) 10 MEQ tablet Take 10 mEq by mouth daily. 09/19/20   [provider]  pravastatin  (PRAVACHOL) 10 MG tablet Take 1 tablet (10 mg total) by mouth every evening. 02/12/17   Love, Ivan Anchors, PA-C  tamsulosin (FLOMAX) 0.4 MG CAPS capsule Take 1 capsule (0.4 mg total) by mouth daily after supper. 12/05/20   Nita Sells, MD  albuterol (PROVENTIL) (2.5 MG/3ML) 0.083% nebulizer solution Take 3 mLs (2.5 mg total) by nebulization every 2 (two) hours as needed for wheezing. Patient not taking: Reported on 11/28/2020 03/11/17 12/01/20  Theodis Blaze, MD    Physical Exam: Vitals:   08/15/22 1930 08/15/22 2130 08/15/22 2245 08/15/22 2315  BP: 139/75 139/70 (!) 141/74 (!) 131/90  Pulse: 86 88 86 88  Resp: '17 17 19 20  '$ Temp: 99.1 F (37.3 C)   99.1 F (37.3 C)  TempSrc: Oral   Oral  SpO2: 100% 98% 99% 100%  Weight:      Height:  Physical Exam Vitals reviewed.  Constitutional:      General: He is not in acute distress. HENT:     Head: Normocephalic and atraumatic.  Eyes:     Extraocular Movements: Extraocular movements intact.  Cardiovascular:     Rate and Rhythm: Normal rate and regular rhythm.     Pulses: Normal pulses.  Pulmonary:     Effort: Pulmonary effort is normal. No respiratory distress.     Breath sounds: Normal breath sounds. No wheezing or rales.  Abdominal:     General: Bowel sounds are normal. There is no distension.     Palpations: Abdomen is soft.     Tenderness: There is no abdominal tenderness.  Musculoskeletal:     Cervical back: Normal range of motion.     Right lower leg: No edema.     Left lower leg: No edema.  Skin:    General: Skin is warm and dry.  Neurological:     General: No focal deficit present.     Mental Status: He is alert and oriented to person, place, and time.     Labs on Admission: I have personally reviewed following labs and imaging studies  CBC: Recent Labs  Lab 08/15/22 1259  WBC 12.4*  NEUTROABS 9.8*  HGB 10.5*  HCT 31.5*  MCV 89.0  PLT [id number]   Basic Metabolic Panel: Recent Labs  Lab 08/15/22 1259   NA 135  K 3.0*  CL 109  CO2 16*  GLUCOSE 106*  BUN 9  CREATININE 0.97  CALCIUM 6.1*  MG 1.2*   GFR: Estimated Creatinine Clearance: 69.3 mL/min (by C-G formula based on SCr of 0.97 mg/dL). Liver Function Tests: No results for input(s): "AST", "ALT", "ALKPHOS", "BILITOT", "PROT", "ALBUMIN" in the last 168 hours. No results for input(s): "LIPASE", "AMYLASE" in the last 168 hours. No results for input(s): "AMMONIA" in the last 168 hours. Coagulation Profile: No results for input(s): "INR", "PROTIME" in the last 168 hours. Cardiac Enzymes: No results for input(s): "CKTOTAL", "CKMB", "CKMBINDEX", "TROPONINI" in the last 168 hours. BNP (last 3 results) No results for input(s): "PROBNP" in the last 8760 hours. HbA1C: No results for input(s): "HGBA1C" in the last 72 hours. CBG: No results for input(s): "GLUCAP" in the last 168 hours. Lipid Profile: No results for input(s): "CHOL", "HDL", "LDLCALC", "TRIG", "CHOLHDL", "LDLDIRECT" in the last 72 hours. Thyroid Function Tests: No results for input(s): "TSH", "T4TOTAL", "FREET4", "T3FREE", "THYROIDAB" in the last 72 hours. Anemia Panel: No results for input(s): "VITAMINB12", "FOLATE", "FERRITIN", "TIBC", "IRON", "RETICCTPCT" in the last 72 hours. Urine analysis:    Component Value Date/Time   COLORURINE YELLOW 08/15/2022 2112   APPEARANCEUR CLOUDY (A) 08/15/2022 2112   LABSPEC 1.011 08/15/2022 2112   PHURINE 5.0 08/15/2022 2112   GLUCOSEU >=500 (A) 08/15/2022 2112   HGBUR MODERATE (A) 08/15/2022 2112   HGBUR trace-intact 08/08/2010 1216   BILIRUBINUR NEGATIVE 08/15/2022 2112   KETONESUR 20 (A) 08/15/2022 2112   PROTEINUR NEGATIVE 08/15/2022 2112   UROBILINOGEN 0.2 09/27/2011 1540   NITRITE NEGATIVE 08/15/2022 2112   LEUKOCYTESUR LARGE (A) 08/15/2022 2112    Radiological Exams on Admission: CT Angio Chest PE W and/or Wo Contrast  Result Date: 08/15/2022 CLINICAL DATA:  Chest pain, shortness of breath EXAM: CT ANGIOGRAPHY  CHEST WITH CONTRAST TECHNIQUE: Multidetector CT imaging of the chest was performed using the standard protocol during bolus administration of intravenous contrast. Multiplanar CT image reconstructions and MIPs were obtained to evaluate the vascular anatomy. RADIATION DOSE REDUCTION: This  exam was performed according to the departmental dose-optimization program which includes automated exposure control, adjustment of the mA and/or kV according to patient size and/or use of iterative reconstruction technique. CONTRAST:  70m OMNIPAQUE IOHEXOL 350 MG/ML SOLN COMPARISON:  07/08/2022 FINDINGS: Cardiovascular: Satisfactory opacification the bilateral pulmonary arteries to the lobar level. No evidence of pulmonary embolism. Stable 4.5 cm ascending thoracic aortic aneurysm. 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 Atherosclerotic calcifications of the aortic arch. Heart is normal in size. Small inferior pericardial effusion, unchanged. Moderate three-vessel coronary atherosclerosis. Mediastinum/Nodes: No suspicious mediastinal lymphadenopathy. Visualized thyroid is unremarkable. Lungs/Pleura: Mild dependent atelectasis in the bilateral lower lobes. Trace left pleural effusion. No suspicious pulmonary nodules. No pneumothorax. Upper Abdomen: Visualized upper abdomen is grossly unremarkable. Musculoskeletal: Degenerative changes of the visualized thoracolumbar spine. Review of the MIP images confirms the above findings. IMPRESSION: No evidence of pulmonary embolism. Stable 4.5 cm ascending thoracic aortic aneurysm. 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 Trace left pleural effusion. Aortic Atherosclerosis (ICD10-I70.0). Electronically Signed   By: SJulian HyM.D.   On: 08/15/2022 20:22   DG Chest 2 View  Result Date: 08/15/2022 CLINICAL DATA:  chest pain EXAM: CHEST - 2 VIEW COMPARISON:  July 03, 2021, July 08, 2022  FINDINGS: The cardiomediastinal silhouette is unchanged in contour.Tortuous thoracic aorta. Atherosclerotic calcifications. No significant pleural effusion. Similar LEFT-sided pleural blunting the costophrenic border likely due to superimposed fat. No pneumothorax. Similar LEFT basilar platelike opacity most consistent with atelectasis. IMPRESSION: No acute cardiopulmonary abnormality. Electronically Signed   By: SValentino SaxonM.D.   On: 08/15/2022 14:52    EKG: Independently reviewed.  Sinus rhythm with first-degree AV block, no significant change since prior tracing.  Assessment and Plan  Chest pain ACS less likely as troponin negative x 2.  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  Currently chest pain-free.  Continue cardiac monitoring.  Shortness of breath and cough No tachypnea or hypoxemia. COVID/influenza/RSV PCR negative.  CT showing trace left pleural effusion.  BNP is normal.  Continue to monitor  Antitussive as needed.  UTI WBC count 12.4, no signs of sepsis.  Patient is reporting blood clots in his urine.  Bedside canister filled with urine which is cloudy and yellow, no blood clots noted. UA with large amount of leukocytes and microscopy showing 21-50 RBCs,>50 WBCs and many bacteria. Continue ceftriaxone and add on urine culture.  Monitor WBC count.  Diarrhea No complaints of abdominal pain and exam benign.  C. difficile PCR and GI pathogen panel ordered, enteric precautions.  Hypokalemia Possibly related to diuretic use.  Patient denies current alcohol use.  Continue to monitor electrolytes and replace as needed.  Continue cardiac monitoring.  Hypomagnesemia Patient denies current alcohol use.  Continue to replace electrolytes and monitor labs closely.  Hypocalcemia Calcium 6.1 but albumin level has not been checked.  Patient received IV calcium  gluconate 1 g in the ED.  Repeat labs to check calcium and albumin level.  Continue to replace if calcium low.  Continue cardiac monitoring.  Normal anion gap metabolic acidosis Likely related to diuretic use and diarrhea.  Hold diuretic and give IV fluids.  Continue to monitor labs closely.  History of alcohol abuse Patient states he stopped drinking 2 years ago.  No signs of withdrawal at this time.  CIWA monitoring.  Non-insulin-dependent type  2 diabetes A1c 6.1 in January 2023.  Repeat A1c ordered.  Sensitive sliding scale insulin ACHS.  Hypertension: Currently normotensive. Hyperlipidemia BPH GERD Anxiety and depression Pharmacy med rec pending.  DVT prophylaxis: SCDs Code Status: Full Code (discussed with the patient) Family Communication: No family available at this time. Level of care: Telemetry bed Admission status: It is my clinical opinion that referral for OBSERVATION is reasonable and necessary in this patient based on the above information provided. The aforementioned taken together are felt to place the patient at high risk for further clinical deterioration. However, it is anticipated that the patient may be medically stable for discharge from the hospital within 24 to 48 hours.   Shela Leff MD [facility]  If 7PM-7AM, please contact night-coverage [email or web address]  08/16/2022, 12:36 AM

## 2022-08-16 NOTE — TOC Progression Note (Signed)
Transition of Care [facility]) - Progression Note    Patient Details  Name: Donald Calderon MRN: [id number] Date of Birth: [birth year]  [facility] of [facility]) CM/SW Contact  Zenon Mayo, RN Phone Number: 08/16/2022, 10:14 AM  Clinical Narrative:    From home alone, his daughter lives in [place], he states his next door neighbor takes him to his [place] apts and checks on him.  He is pretty indep at home.  TOC following.        Expected Discharge Plan and Services                                               Social Determinants of Health (SDOH) Interventions SDOH Screenings   Food Insecurity: No Food Insecurity (08/16/2022)  Housing: Low Risk  (08/16/2022)  Transportation Needs: No Transportation Needs (08/16/2022)  Utilities: Not At Risk (08/16/2022)  Tobacco Use: Medium Risk (08/16/2022)    Readmission Risk Interventions     No data to display

## 2022-08-16 NOTE — Progress Notes (Addendum)
PROGRESS NOTE    Donald Calderon  [id number] DOB: 1941/06/13 DOA: 08/15/2022 PCP: Medicine, [facility]   Brief Narrative:  Donald Calderon is a 82 y.o. 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 Cyndia Bent, type 2 diabetes with neuropathy, hypertension, hyperlipidemia, obesity (BMI 33.19), BPH, anxiety, depression, GERD presented to the ED via EMS with chest pain and shortness of breath.  EMS gave him 1 nitro after which his pain improved.    On arrival to the ED, vital signs stable.  Labs significant for WBC 12.4, hemoglobin 10.5 (stable), potassium 3.0, bicarb 16, anion gap 10, glucose 106, calcium 6.1, albumin level not checked, BNP normal, troponin negative x 2, magnesium 1.2, COVID/influenza/RSV PCR negative.  UA with signs of infection (large amount of leukocytes and microscopy showing >50 WBCs and many bacteria).  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  Trace left pleural effusion seen. Patient received oral potassium 40 mEq, IV calcium gluconate 1 g, ceftriaxone, and IV magnesium 2 g.  TRH called to admit.   Assessment & Plan:  Chest pain: -ACS unlikely.  Troponin x 2 negative.  EKG no acute findings. -CTA chest negative for PE. -He continues to have chest pain.  Will repeat troponin.  Will repeat EKG.  Maalox given.  Nitro as needed -Will order echo.  Monitor vitals closely  Ascending thoracic aortic aneurysm: -Noted on CT.  4.5 cm stable, chronic focal dissection involving the mid descending thoracic aorta  UTI: -Patient is afebrile with leukocytosis and 12,000. -History of hematuria.  On Rocephin.  Await urine culture result.  Diarrhea: -Benign abdomen.  C. difficile negative.  GI pathogen  pending  Hypomagnesemia: Replenished  Hypocalcemia: Replenished  Hypokalemia: Resolved  Normocytic anemia: H&H is stable.  Continue to monitor  Mild AKI: Likely due to poor intake and diarrhea and diuretic use. -Start on gentle hydration.  Monitor renal function closely  Normal anion gap metabolic acidosis: Likely in the setting of diarrhea and dehydration -Continue IV fluids.  Continue to monitor  Hyponatremia: Start on gentle hydration  History of alcohol abuse: -No sign of withdrawal at this time.  CIWA monitoring  Non-insulin-dependent type 2 diabetes: A1c is pending.  Continue sliding scale insulin   DVT prophylaxis: SCD, Code Status: Full code Family Communication:  None present at bedside.  Plan of care discussed with patient in length and he verbalized understanding and agreed with it.  I tried to call patient's daughter twice with no answer.  Disposition Plan: To be determined  Consultants:  None  Procedures:  None  Antimicrobials:  Rocephin  Status is: Observation    Subjective: Patient seen and examined.  Reports chest pain with deep breathing and some cough.  Noticed blood in his urine yesterday.  No history of renal stone however has history of BPH and followed by [facility] per patient.  No fever or chills.  RN notified that patient complaining of chest pain.  Troponin and EKG ordered.  Nitro given.  Pain improved.  Objective: Vitals:   08/16/22 1229 08/16/22 1241 08/16/22 1250 08/16/22 1300  BP: 91/64 99/64 108/70 122/69  Pulse: 90 83 80 78  Resp: '15 20 16 16  '$ Temp:      TempSrc:      SpO2: 97% 99% 100% 100%  Weight:      Height:        Intake/Output Summary (Last 24 hours) at 08/16/2022 1307 Last data filed at 08/16/2022 1122 Gross per 24 hour  Intake 424.45 ml  Output 750 ml  Net -325.55 ml   Filed Weights   08/15/22 1306 08/16/22 0423  Weight: 98.9 kg 90.7 kg    Examination:  General exam: Appears calm and comfortable   Respiratory system: Clear to auscultation. Respiratory effort normal. Cardiovascular system: S1 & S2 heard, RRR. No JVD, murmurs, rubs, gallops or clicks. No pedal edema. Gastrointestinal system: Abdomen is nondistended, soft and nontender. No organomegaly or masses felt. Normal bowel sounds heard. Central nervous system: Alert and oriented. No focal neurological deficits. Extremities: Symmetric 5 x 5 power. Skin: No rashes, lesions or ulcers Psychiatry: Judgement and insight appear normal. Mood & affect appropriate.    Data Reviewed: I have personally reviewed following labs and imaging studies  CBC: Recent Labs  Lab 08/15/22 1259 08/16/22 0342  WBC 12.4* 12.3*  NEUTROABS 9.8*  --   HGB 10.5* 11.3*  HCT 31.5* 33.8*  MCV 89.0 86.4  PLT 232 0000000   Basic Metabolic Panel: Recent Labs  Lab 08/15/22 1259 08/16/22 0342  NA 135 129*  K 3.0* 3.7  CL 109 96*  CO2 16* 20*  GLUCOSE 106* 136*  BUN 9 11  CREATININE 0.97 1.29*  CALCIUM 6.1* 8.8*  MG 1.2* 2.6*   GFR: Estimated Creatinine Clearance: 50 mL/min (A) (by C-G formula based on SCr of 1.29 mg/dL (H)). Liver Function Tests: Recent Labs  Lab 08/16/22 0342  AST 11*  ALT 8  ALKPHOS 48  BILITOT 1.0  PROT 6.4*  ALBUMIN 2.5*   No results for input(s): "LIPASE", "AMYLASE" in the last 168 hours. No results for input(s): "AMMONIA" in the last 168 hours. Coagulation Profile: No results for input(s): "INR", "PROTIME" in the last 168 hours. Cardiac Enzymes: No results for input(s): "CKTOTAL", "CKMB", "CKMBINDEX", "TROPONINI" in the last 168 hours. BNP (last 3 results) No results for input(s): "PROBNP" in the last 8760 hours. HbA1C: No results for input(s): "HGBA1C" in the last 72 hours. CBG: Recent Labs  Lab 08/16/22 0145 08/16/22 0819 08/16/22 1120  GLUCAP 172* 178* 195*   Lipid Profile: No results for input(s): "CHOL", "HDL", "LDLCALC", "TRIG", "CHOLHDL", "LDLDIRECT" in the last 72 hours. Thyroid Function  Tests: No results for input(s): "TSH", "T4TOTAL", "FREET4", "T3FREE", "THYROIDAB" in the last 72 hours. Anemia Panel: No results for input(s): "VITAMINB12", "FOLATE", "FERRITIN", "TIBC", "IRON", "RETICCTPCT" in the last 72 hours. Sepsis Labs: No results for input(s): "PROCALCITON", "LATICACIDVEN" in the last 168 hours.  Recent Results (from the past 240 hour(s))  Resp panel by RT-PCR (RSV, Flu A&B, Covid) Anterior Nasal Swab     Status: None   Collection Time: 08/15/22  4:32 PM   Specimen: Anterior Nasal Swab  Result Value Ref Range Status   SARS Coronavirus 2 by RT PCR NEGATIVE NEGATIVE Final   Influenza A by PCR NEGATIVE NEGATIVE Final   Influenza B by PCR NEGATIVE NEGATIVE Final    Comment: (NOTE) The Xpert Xpress SARS-CoV-2/FLU/RSV plus assay is intended as an aid in the diagnosis of influenza from Nasopharyngeal swab specimens and should not be used as a sole basis for treatment. Nasal washings and aspirates are unacceptable for Xpert Xpress SARS-CoV-2/FLU/RSV testing.  Fact Sheet for Patients: EntrepreneurPulse.com.au  Fact Sheet for Healthcare Providers: IncredibleEmployment.be  This test is not yet approved or cleared by the [place] FDA  and has been authorized for detection and/or diagnosis of SARS-CoV-2 by FDA under an Emergency Use Authorization (EUA). This EUA will remain in effect (meaning this test can be used) for the duration of the COVID-19 declaration under Section 564(b)(1) of the Act, 21 U.S.C. section 360bbb-3(b)(1), unless the authorization is terminated or revoked.     Resp Syncytial Virus by PCR NEGATIVE NEGATIVE Final    Comment: (NOTE) Fact Sheet for Patients: EntrepreneurPulse.com.au  Fact Sheet for Healthcare Providers: IncredibleEmployment.be  This test is not yet approved or cleared by the [place] FDA and has been authorized for detection and/or diagnosis of  SARS-CoV-2 by FDA under an Emergency Use Authorization (EUA). This EUA will remain in effect (meaning this test can be used) for the duration of the COVID-19 declaration under Section 564(b)(1) of the Act, 21 U.S.C. section 360bbb-3(b)(1), unless the authorization is terminated or revoked.  Performed at [facility], [place] [address], [place], [place] [address]   C Difficile Quick Screen w PCR reflex     Status: None   Collection Time: 08/16/22  5:07 AM   Specimen: STOOL  Result Value Ref Range Status   C Diff antigen NEGATIVE NEGATIVE Final   C Diff toxin NEGATIVE NEGATIVE Final   C Diff interpretation No C. difficile detected.  Final    Comment: Performed at [facility], [place] [address], [place], [place] [address]      Radiology Studies: CT Angio Chest PE W and/or Wo Contrast  Result Date: 08/15/2022 CLINICAL DATA:  Chest pain, shortness of breath EXAM: CT ANGIOGRAPHY CHEST WITH CONTRAST TECHNIQUE: Multidetector CT imaging of the chest was performed using the standard protocol during bolus administration of intravenous contrast. Multiplanar CT image reconstructions and MIPs were obtained to evaluate the vascular anatomy. RADIATION DOSE REDUCTION: This exam was performed according to the departmental dose-optimization program which includes automated exposure control, adjustment of the mA and/or kV according to patient size and/or use of iterative reconstruction technique. CONTRAST:  64m OMNIPAQUE IOHEXOL 350 MG/ML SOLN COMPARISON:  07/08/2022 FINDINGS: Cardiovascular: Satisfactory opacification the bilateral pulmonary arteries to the lobar level. No evidence of pulmonary embolism. Stable 4.5 cm ascending thoracic aortic aneurysm. 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 Atherosclerotic calcifications of the aortic arch. Heart is normal in size. Small inferior pericardial effusion, unchanged. Moderate three-vessel coronary  atherosclerosis. Mediastinum/Nodes: No suspicious mediastinal lymphadenopathy. Visualized thyroid is unremarkable. Lungs/Pleura: Mild dependent atelectasis in the bilateral lower lobes. Trace left pleural effusion. No suspicious pulmonary nodules. No pneumothorax. Upper Abdomen: Visualized upper abdomen is grossly unremarkable. Musculoskeletal: Degenerative changes of the visualized thoracolumbar spine. Review of the MIP images confirms the above findings. IMPRESSION: No evidence of pulmonary embolism. Stable 4.5 cm ascending thoracic aortic aneurysm. 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 Trace left pleural effusion. Aortic Atherosclerosis (ICD10-I70.0). Electronically Signed   By: SJulian HyM.D.   On: 08/15/2022 20:22   DG Chest 2 View  Result Date: 08/15/2022 CLINICAL DATA:  chest pain EXAM: CHEST - 2 VIEW COMPARISON:  July 03, 2021, July 08, 2022 FINDINGS: The cardiomediastinal silhouette is unchanged in contour.Tortuous thoracic aorta. Atherosclerotic calcifications. No significant pleural effusion. Similar LEFT-sided pleural blunting the costophrenic border likely due to superimposed fat. No pneumothorax. Similar LEFT basilar platelike opacity most consistent with atelectasis. IMPRESSION: No acute cardiopulmonary abnormality. Electronically Signed   By: SValentino SaxonM.D.   On: 08/15/2022 14:52    Scheduled Meds:  alum & mag hydroxide-simeth  30 mL Oral Once   insulin aspart  0-5 Units Subcutaneous QHS   insulin aspart  0-9 Units Subcutaneous TID WC   Continuous Infusions:  sodium chloride 100 mL/hr at 08/16/22 1240   cefTRIAXone (ROCEPHIN)  IV       LOS: 0 days   Time spent: 40 minutes   Sparrow Sanzo Loann Quill, MD [facility]  If 7PM-7AM, please contact night-coverage [email or web address] 08/16/2022, 1:07 PM

## 2022-08-16 NOTE — Progress Notes (Signed)
Pt. C/o sharp chest pain mid chest 9/10. Pt. Given nitro x 2. Pain decreased to 5/10. BP 154/76. After first dose of nitro BP dropped to 137/76. After second dose BP dropped to 91/64. Pt still having chest pain 5/10 at that time. Held third nitro, MD ordered fluid. BP came back to 122/69. Pt. Still c/o chest pain 5/10, sharp, and tight. Maalox given. Will continue to monitor. Family and Dr. Doristine Bosworth is aware.  Neita Carp, Student RN

## 2022-08-16 NOTE — Care Management Obs Status (Signed)
[facility] NOTIFICATION   Patient Details  Name: Donald Calderon MRN: [id number] Date of Birth: 12/26/1940   Medicare Observation Status Notification Given:  Yes    Zenon Mayo, RN 08/16/2022, 10:11 AM

## 2022-08-16 NOTE — ED Notes (Signed)
ED TO INPATIENT HANDOFF REPORT  ED Nurse Name and Phone #: Bonna Gains [phone]  S Name/Age/Gender Donald Calderon 82 y.o. male Room/Bed: 033C/033C  Code Status   Code Status: Full Code  Home/SNF/Other Home Patient oriented to: self, place, time, and situation Is this baseline? Yes   Triage Complete: Triage complete  Chief Complaint UTI (urinary tract infection) [N39.0]  Triage Note Pt BIB EMS from home for x3 days of tight chest pain and SOB at rest that worsens when laying flat. Pt denies radiating pain & N/V. Pt reported SOB while laying flat and presents with productive cough. Pt took ASA at home, unknown dose. EMS gave 1 nitro which relived some CP and reduce SBP from 110 to 93/63. Pt is blind, A&Ox4, NAD at this time.      Allergies Allergies  Allergen Reactions   Shellfish Allergy Anaphylaxis    Throat and tongue swells   Penicillins Itching and Rash    ** Tolerates cephalosporins Has patient had a PCN reaction causing immediate rash, facial/tongue/throat swelling, SOB or lightheadedness with hypotension: No Has patient had a PCN reaction causing severe rash involving mucus membranes or skin necrosis: No Has patient had a PCN reaction that required hospitalization: No Has patient had a PCN reaction occurring within the last 10 years: No If all of the above answers are "NO", then may proceed with Cephalosporin use.  Tolerated Ancef     Level of Care/Admitting Diagnosis ED Disposition     ED Disposition  Admit   Condition  --   Comment  Hospital Area: [place] [[id number]]  Level of Care: Telemetry Cardiac [[id number]]  May place patient in observation at [facility] or [place] if equivalent level of care is available:: Yes  Covid Evaluation: Asymptomatic - no recent exposure (last 10 days) testing not required  Diagnosis: UTI (urinary tract infection) [id number]  Admitting Physician: Shela Leff [id number]  Attending Physician: Shela Leff [id number]          B Medical/Surgery History Past Medical History:  Diagnosis Date   Abdominal aortic aneurysm ([place])    ALCOHOL ABUSE    Allergic rhinitis    Anxiety    Arthritis    Atopic dermatitis    Atopic dermatitis    BPH (benign prostatic hyperplasia)    Chest pain 08/16/2022   Decreased functional mobility and endurance    Depression    Diabetes mellitus    Diabetic neuropathy (HCC)    Diabetic retinopathy ([place])    GERD (gastroesophageal reflux disease)    Glaucoma    HTN (hypertension)    Hyperlipidemia    Hypertension    Hyponatremia    Impaired vision    Leg edema    Mild mitral valve regurgitation    Overweight    Peripheral edema    Pneumonia    Shingles    Vitamin D deficiency    Past Surgical History:  Procedure Laterality Date   BACK SURGERY      disc fro picched nerve times 2   EYE SURGERY Right    Gaucoma   LUMBAR LAMINECTOMY/DECOMPRESSION MICRODISCECTOMY N/A 01/21/2017   Procedure: Decompression Lumbar 2-3;  Surgeon: Melina Schools, MD;  Location: [place];  Service: Orthopedics;  Laterality: N/A;  210 mins   LUMBAR LAMINECTOMY/DECOMPRESSION MICRODISCECTOMY N/A 01/23/2017   Procedure: EXPLORATION AND EVACUATION OF HEMATOMA L2-L3;  Surgeon: Melina Schools, MD;  Location: [place];  Service: Orthopedics;  Laterality: N/A;   PROSTATE SURGERY  A IV Location/Drains/Wounds Patient Lines/Drains/Airways Status     Active Line/Drains/Airways     Name Placement date Placement time Site Days   Peripheral IV 08/15/22 20 G Left Antecubital 08/15/22  1626  Antecubital  1            Intake/Output Last 24 hours  Intake/Output Summary (Last 24 hours) at 08/16/2022 0251 Last data filed at 08/15/2022 2347 Gross per 24 hour  Intake 188.01 ml  Output 200 ml  Net -11.99 ml    Labs/Imaging Results for orders placed or performed during the hospital encounter of 08/15/22 (from the past 48 hour(s))  CBC with Differential     Status: Abnormal    Collection Time: 08/15/22 12:59 PM  Result Value Ref Range   WBC 12.4 (H) 4.0 - 10.5 K/uL   RBC 3.54 (L) 4.22 - 5.81 MIL/uL   Hemoglobin 10.5 (L) 13.0 - 17.0 g/dL   HCT 31.5 (L) 39.0 - 52.0 %   MCV 89.0 80.0 - 100.0 fL   MCH 29.7 26.0 - 34.0 pg   MCHC 33.3 30.0 - 36.0 g/dL   RDW 13.6 11.5 - 15.5 %   Platelets 232 150 - 400 K/uL   nRBC 0.0 0.0 - 0.2 %   Neutrophils Relative % 79 %   Neutro Abs 9.8 (H) 1.7 - 7.7 K/uL   Lymphocytes Relative 10 %   Lymphs Abs 1.2 0.7 - 4.0 K/uL   Monocytes Relative 10 %   Monocytes Absolute 1.2 (H) 0.1 - 1.0 K/uL   Eosinophils Relative 0 %   Eosinophils Absolute 0.0 0.0 - 0.5 K/uL   Basophils Relative 0 %   Basophils Absolute 0.0 0.0 - 0.1 K/uL   Immature Granulocytes 1 %   Abs Immature Granulocytes 0.08 (H) 0.00 - 0.07 K/uL    Comment: Performed at [facility], [address] [place], [place] [id number]  Basic metabolic panel     Status: Abnormal   Collection Time: 08/15/22 12:59 PM  Result Value Ref Range   Sodium 135 135 - 145 mmol/L   Potassium 3.0 (L) 3.5 - 5.1 mmol/L    Comment: HEMOLYSIS AT THIS LEVEL MAY AFFECT RESULT   Chloride 109 98 - 111 mmol/L   CO2 16 (L) 22 - 32 mmol/L   Glucose, Bld 106 (H) 70 - 99 mg/dL    Comment: Glucose reference range applies only to samples taken after fasting for at least 8 hours.   BUN 9 8 - 23 mg/dL   Creatinine, Ser 0.97 0.61 - 1.24 mg/dL   Calcium 6.1 (LL) 8.9 - 10.3 mg/dL    Comment: CRITICAL RESULT CALLED TO, READ BACK BY AND VERIFIED WITH A,Merril Isakson RN '@[id number]'$  08/15/22 E,[place]   GFR, Estimated >60 >60 mL/min    Comment: (NOTE) Calculated using the CKD-EPI Creatinine Equation (2021)    Anion gap 10 5 - 15    Comment: Performed at [facility] [address] [place], [place] [address]  Brain natriuretic peptide     Status: None   Collection Time: 08/15/22 12:59 PM  Result Value Ref Range   B Natriuretic Peptide 61.4 0.0 - 100.0 pg/mL    Comment: Performed at [facility] [address] [place], [place] [address]  Troponin I (High Sensitivity)     Status: None   Collection Time: 08/15/22 12:59 PM  Result Value Ref Range   Troponin I (High Sensitivity) 10 <18 ng/L    Comment: (NOTE) Elevated high  sensitivity troponin I (hsTnI) values and significant  changes across serial measurements may suggest ACS but many other  chronic and acute conditions are known to elevate hsTnI results.  Refer to the "Links" section for chest pain algorithms and additional  guidance. Performed at [facility], [place] [address] [place], [place] [address]   Magnesium     Status: Abnormal   Collection Time: 08/15/22 12:59 PM  Result Value Ref Range   Magnesium 1.2 (L) 1.7 - 2.4 mg/dL    Comment: Performed at [facility] [address], [place], [place] [address]  Resp panel by RT-PCR (RSV, Flu A&B, Covid) Anterior Nasal Swab     Status: None   Collection Time: 08/15/22  4:32 PM   Specimen: Anterior Nasal Swab  Result Value Ref Range   SARS Coronavirus 2 by RT PCR NEGATIVE NEGATIVE   Influenza A by PCR NEGATIVE NEGATIVE   Influenza B by PCR NEGATIVE NEGATIVE    Comment: (NOTE) The Xpert Xpress SARS-CoV-2/FLU/RSV plus assay is intended as an aid in the diagnosis of influenza from Nasopharyngeal swab specimens and should not be used as a sole basis for treatment. Nasal washings and aspirates are unacceptable for Xpert Xpress SARS-CoV-2/FLU/RSV testing.  Fact Sheet for Patients: EntrepreneurPulse.com.au  Fact Sheet for Healthcare Providers: IncredibleEmployment.be  This test is not yet approved or cleared by the [place] FDA and has been authorized for detection and/or diagnosis of SARS-CoV-2 by FDA under an Emergency Use Authorization (EUA). This EUA will remain in effect (meaning this test can be used) for the duration of the COVID-19 declaration under Section 564(b)(1) of the Act, 21 U.S.C. section 360bbb-3(b)(1), unless  the authorization is terminated or revoked.     Resp Syncytial Virus by PCR NEGATIVE NEGATIVE    Comment: (NOTE) Fact Sheet for Patients: EntrepreneurPulse.com.au  Fact Sheet for Healthcare Providers: IncredibleEmployment.be  This test is not yet approved or cleared by the [place] FDA and has been authorized for detection and/or diagnosis of SARS-CoV-2 by FDA under an Emergency Use Authorization (EUA). This EUA will remain in effect (meaning this test can be used) for the duration of the COVID-19 declaration under Section 564(b)(1) of the Act, 21 U.S.C. section 360bbb-3(b)(1), unless the authorization is terminated or revoked.  Performed at [facility], [place] [address], [place], [place] [address]   Troponin I (High Sensitivity)     Status: None   Collection Time: 08/15/22  4:32 PM  Result Value Ref Range   Troponin I (High Sensitivity) 9 <18 ng/L    Comment: (NOTE) Elevated high sensitivity troponin I (hsTnI) values and significant  changes across serial measurements may suggest ACS but many other  chronic and acute conditions are known to elevate hsTnI results.  Refer to the "Links" section for chest pain algorithms and additional  guidance. Performed at [facility], [place] [address] [place], [place] [address]   Urinalysis, Routine w reflex microscopic -Urine, Clean Catch     Status: Abnormal   Collection Time: 08/15/22  9:12 PM  Result Value Ref Range   Color, Urine YELLOW YELLOW   APPearance CLOUDY (A) CLEAR   Specific Gravity, Urine 1.011 1.005 - 1.030   pH 5.0 5.0 - 8.0   Glucose, UA >=500 (A) NEGATIVE mg/dL   Hgb urine dipstick MODERATE (A) NEGATIVE   Bilirubin Urine NEGATIVE NEGATIVE   Ketones, ur 20 (A) NEGATIVE mg/dL   Protein, ur NEGATIVE NEGATIVE mg/dL   Nitrite NEGATIVE  NEGATIVE   Leukocytes,Ua LARGE (A) NEGATIVE   RBC / HPF 21-50 0 - 5 RBC/hpf   WBC, UA >50 0 - 5 WBC/hpf   Bacteria, UA MANY (A) NONE  SEEN   Squamous Epithelial / HPF 0-5 0 - 5 /HPF   WBC Clumps PRESENT     Comment: Performed at [facility], [place] [address] [place], [place] [address]  CBG monitoring, ED     Status: Abnormal   Collection Time: 08/16/22  1:45 AM  Result Value Ref Range   Glucose-Capillary 172 (H) 70 - 99 mg/dL    Comment: Glucose reference range applies only to samples taken after fasting for at least 8 hours.   CT Angio Chest PE W and/or Wo Contrast  Result Date: 08/15/2022 CLINICAL DATA:  Chest pain, shortness of breath EXAM: CT ANGIOGRAPHY CHEST WITH CONTRAST TECHNIQUE: Multidetector CT imaging of the chest was performed using the standard protocol during bolus administration of intravenous contrast. Multiplanar CT image reconstructions and MIPs were obtained to evaluate the vascular anatomy. RADIATION DOSE REDUCTION: This exam was performed according to the departmental dose-optimization program which includes automated exposure control, adjustment of the mA and/or kV according to patient size and/or use of iterative reconstruction technique. CONTRAST:  72m OMNIPAQUE IOHEXOL 350 MG/ML SOLN COMPARISON:  07/08/2022 FINDINGS: Cardiovascular: Satisfactory opacification the bilateral pulmonary arteries to the lobar level. No evidence of pulmonary embolism. Stable 4.5 cm ascending thoracic aortic aneurysm. 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 Atherosclerotic calcifications of the aortic arch. Heart is normal in size. Small inferior pericardial effusion, unchanged. Moderate three-vessel coronary atherosclerosis. Mediastinum/Nodes: No suspicious mediastinal lymphadenopathy. Visualized thyroid is unremarkable. Lungs/Pleura: Mild dependent atelectasis in the bilateral lower lobes. Trace left pleural effusion. No suspicious pulmonary nodules. No pneumothorax. Upper Abdomen: Visualized upper abdomen is grossly unremarkable. Musculoskeletal: Degenerative changes  of the visualized thoracolumbar spine. Review of the MIP images confirms the above findings. IMPRESSION: No evidence of pulmonary embolism. Stable 4.5 cm ascending thoracic aortic aneurysm. 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 Trace left pleural effusion. Aortic Atherosclerosis (ICD10-I70.0). Electronically Signed   By: SJulian HyM.D.   On: 08/15/2022 20:22   DG Chest 2 View  Result Date: 08/15/2022 CLINICAL DATA:  chest pain EXAM: CHEST - 2 VIEW COMPARISON:  July 03, 2021, July 08, 2022 FINDINGS: The cardiomediastinal silhouette is unchanged in contour.Tortuous thoracic aorta. Atherosclerotic calcifications. No significant pleural effusion. Similar LEFT-sided pleural blunting the costophrenic border likely due to superimposed fat. No pneumothorax. Similar LEFT basilar platelike opacity most consistent with atelectasis. IMPRESSION: No acute cardiopulmonary abnormality. Electronically Signed   By: SValentino SaxonM.D.   On: 08/15/2022 14:52    Pending Labs Unresulted Labs (From admission, onward)     Start     Ordered   08/16/22 0500  Comprehensive metabolic panel  Tomorrow morning,   R        08/16/22 0131   08/16/22 0500  CBC  Tomorrow morning,   R        08/16/22 0131   08/16/22 0500  Magnesium  Tomorrow morning,   R        08/16/22 0131   08/16/22 0131  Urine Culture (for pregnant, neutropenic or urologic patients or patients with an indwelling urinary catheter)  (Urine Labs)  Once,   R       Question:  Indication  Answer:  Urgency/frequency   08/16/22 0131   08/16/22 0131  C Difficile Quick Screen w PCR reflex  (C Difficile quick screen w PCR reflex panel )  Once, for 24 hours,   TIMED       References:    CDiff Information Tool   08/16/22 0131   08/16/22 0131  Gastrointestinal Panel by PCR , Stool  (Gastrointestinal Panel by PCR, Stool                                                                                                                                                      **Does Not include CLOSTRIDIUM DIFFICILE testing. **If CDIFF testing is needed, place order from the "C Difficile Testing" order set.**)  Once,   R        08/16/22 0131   08/16/22 0130  Hemoglobin A1c  Once,   R       Comments: To assess prior glycemic control    08/16/22 0131            Vitals/Pain Today's Vitals   08/15/22 2130 08/15/22 2245 08/15/22 2315 08/16/22 0115  BP: 139/70 (!) 141/74 (!) 131/90 129/74  Pulse: 88 86 88 84  Resp: '17 19 20 17  '$ Temp:   99.1 F (37.3 C)   TempSrc:   Oral   SpO2: 98% 99% 100% 100%  Weight:      Height:      PainSc:        Isolation Precautions Enteric precautions (UV disinfection)  Medications Medications  cefTRIAXone (ROCEPHIN) 1 g in sodium chloride 0.9 % 100 mL IVPB (has no administration in time range)  acetaminophen (TYLENOL) tablet 650 mg (has no administration in time range)    Or  acetaminophen (TYLENOL) suppository 650 mg (has no administration in time range)  insulin aspart (novoLOG) injection 0-9 Units (has no administration in time range)  insulin aspart (novoLOG) injection 0-5 Units ( Subcutaneous Not Given 08/16/22 0153)  0.9 %  sodium chloride infusion (1,000 mLs Intravenous New Bag/Given 08/16/22 0151)  magnesium sulfate IVPB 2 g 50 mL (2 g Intravenous New Bag/Given 08/16/22 0152)  guaiFENesin-dextromethorphan (ROBITUSSIN DM) 100-10 MG/5ML syrup 5 mL (has no administration in time range)  potassium chloride SA (KLOR-CON M) CR tablet 40 mEq (40 mEq Oral Given 08/15/22 2001)  magnesium sulfate IVPB 2 g 50 mL (0 g Intravenous Stopped 08/15/22 2104)  iohexol (OMNIPAQUE) 350 MG/ML injection 75 mL (75 mLs Intravenous Contrast Given 08/15/22 2010)  cefTRIAXone (ROCEPHIN) 1 g in sodium chloride 0.9 % 100 mL IVPB (0 g Intravenous Stopped 08/15/22 2230)  calcium gluconate 1 g/ 50 mL sodium chloride IVPB (0 mg Intravenous Stopped 08/15/22 2347)    Mobility non-ambulatory     Focused  Assessments Cardiac Assessment Handoff:  Cardiac Rhythm: Normal sinus rhythm Lab Results  Component Value Date   CKTOTAL 116 07/03/2021   Lab Results  Component Value Date   DDIMER 0.70 (H) 09/27/2011   Does the Patient currently have chest pain? No    R Recommendations: See Admitting Provider Note  Report given to:   Additional Notes: Pt a/o x 4, non-ambulatory

## 2022-08-17 ENCOUNTER — Inpatient Hospital Stay (HOSPITAL_COMMUNITY): Payer: Medicare HMO

## 2022-08-17 DIAGNOSIS — N390 Urinary tract infection, site not specified: Principal | ICD-10-CM

## 2022-08-17 DIAGNOSIS — R079 Chest pain, unspecified: Secondary | ICD-10-CM | POA: Diagnosis not present

## 2022-08-17 DIAGNOSIS — R051 Acute cough: Secondary | ICD-10-CM | POA: Diagnosis not present

## 2022-08-17 DIAGNOSIS — R197 Diarrhea, unspecified: Secondary | ICD-10-CM | POA: Diagnosis not present

## 2022-08-17 DIAGNOSIS — E876 Hypokalemia: Secondary | ICD-10-CM

## 2022-08-17 DIAGNOSIS — F1011 Alcohol abuse, in remission: Secondary | ICD-10-CM

## 2022-08-17 DIAGNOSIS — E872 Acidosis, unspecified: Secondary | ICD-10-CM

## 2022-08-17 DIAGNOSIS — R0602 Shortness of breath: Secondary | ICD-10-CM

## 2022-08-17 LAB — CBC
HCT: 32.4 % — ABNORMAL LOW (ref 39.0–52.0)
Hemoglobin: 10.6 g/dL — ABNORMAL LOW (ref 13.0–17.0)
MCH: 28.6 pg (ref 26.0–34.0)
MCHC: 32.7 g/dL (ref 30.0–36.0)
MCV: 87.6 fL (ref 80.0–100.0)
Platelets: 251 10*3/uL (ref 150–400)
RBC: 3.7 MIL/uL — ABNORMAL LOW (ref 4.22–5.81)
RDW: 13.5 % (ref 11.5–15.5)
WBC: 13.4 10*3/uL — ABNORMAL HIGH (ref 4.0–10.5)
nRBC: 0 % (ref 0.0–0.2)

## 2022-08-17 LAB — URINE CULTURE: Culture: >=100000 — AB

## 2022-08-17 LAB — GLUCOSE, CAPILLARY
Glucose-Capillary: 145 mg/dL — ABNORMAL HIGH (ref 70–99)
Glucose-Capillary: 146 mg/dL — ABNORMAL HIGH (ref 70–99)
Glucose-Capillary: 194 mg/dL — ABNORMAL HIGH (ref 70–99)
Glucose-Capillary: 191 mg/dL — ABNORMAL HIGH (ref 70–99)

## 2022-08-17 LAB — BASIC METABOLIC PANEL
Anion gap: 10 (ref 5–15)
BUN: 8 mg/dL (ref 8–23)
CO2: 19 mmol/L — ABNORMAL LOW (ref 22–32)
Calcium: 8.2 mg/dL — ABNORMAL LOW (ref 8.9–10.3)
Chloride: 98 mmol/L (ref 98–111)
Creatinine, Ser: 1.18 mg/dL (ref 0.61–1.24)
GFR, Estimated: >60 mL/min (ref >60)
Glucose, Bld: 134 mg/dL — ABNORMAL HIGH (ref 70–99)
Potassium: 3.3 mmol/L — ABNORMAL LOW (ref 3.5–5.1)
Sodium: 127 mmol/L — ABNORMAL LOW (ref 135–145)

## 2022-08-17 LAB — MAGNESIUM: Magnesium: 2.2 mg/dL (ref 1.7–2.4)

## 2022-08-17 LAB — URIC ACID: Uric Acid, Serum: 7.9 mg/dL (ref 3.7–8.6)

## 2022-08-17 MED ORDER — GABAPENTIN 300 MG PO CAPS
300.0000 mg | ORAL_CAPSULE | Freq: Two times a day (BID) | ORAL | Status: DC
  Administered 2022-08-17 – 2022-08-21 (×10): 300 mg via ORAL
  Filled 2022-08-17 (×10): qty 1

## 2022-08-17 MED ORDER — POTASSIUM CHLORIDE 20 MEQ PO PACK
40.0000 meq | PACK | Freq: Once | ORAL | Status: AC
  Administered 2022-08-17: 40 meq via ORAL
  Filled 2022-08-17: qty 2

## 2022-08-17 MED ORDER — SACCHAROMYCES BOULARDII 250 MG PO CAPS
250.0000 mg | ORAL_CAPSULE | Freq: Two times a day (BID) | ORAL | Status: DC
  Administered 2022-08-17 – 2022-08-21 (×8): 250 mg via ORAL
  Filled 2022-08-17 (×9): qty 1

## 2022-08-17 NOTE — Progress Notes (Signed)
PROGRESS NOTE    SCHAD MELKA  [id number] DOB: [birth year] DOA: 08/15/2022 PCP: Medicine, [facility]    Brief Narrative:  Donald Calderon is a 82 y.o. 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 Caffie Pinto as outpatient, type 2 diabetes with neuropathy, hypertension, hyperlipidemia, obesity, BPH, anxiety, depression, GERD presented to hospital with shortness of breath and chest pain and was brought in the hospital when his vitals were noted to be stable.  WBC was elevated at 12.4 with hypokalemia at 3.0.  Troponins were negative.  Magnesium was low at 1.2.  COVID/influenza/RSV PCR negative.  UA with signs of infection (large amount of leukocytes and microscopy showing >50 WBCs and many bacteria).  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  Trace left pleural effusion seen. Patient was given oral potassium 40 mEq, IV calcium gluconate 1 g, ceftriaxone, and IV magnesium 2 gm and [facility] was consulted for admission to the hospital.  Assessment & Plan:   Chest pain: EKG without acute findings.  Troponins were negative.  CTA of the chest was negative for pulmonary embolism.  Likely atypical chest pain.  2D echocardiogram pending.  Ascending thoracic aortic aneurysm: -Noted on CT.  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   UTI: Had leukocytosis with history of hematuria.  On Rocephin.  Urine culture pending.   Diarrhea: GI pathogen panel and C. difficile was negative.   Hypomagnesemia: Replenished.  Latest magnesium of 2.2.   Hypocalcemia: Replenished   Hypokalemia: Mild.  Will replenish orally.  Will give 1 dose of 40 meq of potassium today.   Normocytic anemia:  Hemoglobin today at 10.6.  No  evidence of bleeding.   Mild AKI:  Likely due to poor intake and diarrhea and diuretic use. Improved with IV fluids.  Latest creatinine at 1.1.   Normal anion gap metabolic acidosis: Likely in the setting of diarrhea and dehydration Improved after IV fluids.  Gap has resolved at this time.   Hyponatremia: Latest sodium at 127.   History of alcohol abuse: Was on CIWA protocol during hospitalization.  No signs of withdrawal.   Non-insulin-dependent type 2 diabetes: Hemoglobin A1c is pending.  Continue sliding scale insulin   Debility, deconditioning, right knee pain. Patient has been seen by physical therapy recommended CIR but not might not be a good candidate.  Will get [facility] consultation for possible skilled nursing facility/home PT on discharge.  Will obtain x-ray of the right knee.    DVT prophylaxis: SCDs Start: 08/16/22 0127   Code Status:     Code Status: Full Code  Disposition: Home with home health/skilled nursing facility likely in 1 to 2 days.  Status is: Inpatient Remains inpatient appropriate because: Ambulatory dysfunction, debility, deconditioning, ending 2D echocardiogram, x-ray of the knee.   Family Communication: None at bedside.  Consultants:  None  Procedures:  None  Antimicrobials:  Rocephin IV 08/16/2022>  Anti-infectives (From admission, onward)    Start     Dose/Rate Route Frequency Ordered Stop   08/16/22 2100  cefTRIAXone (ROCEPHIN) 1 g in sodium chloride 0.9 % 100 mL IVPB        1 g 200 mL/hr over 30 Minutes Intravenous Every 24 hours 08/16/22 0131     08/15/22 2145  cefTRIAXone (  ROCEPHIN) 1 g in sodium chloride 0.9 % 100 mL IVPB        1 g 200 mL/hr over 30 Minutes Intravenous  Once 08/15/22 2144 08/15/22 2230      Subjective: Today, patient was seen and examined at bedside.  Patient complains that he has some knee pain on the right side.  Nursing staff reported that he has just been able to sit up in the chair with tremendous difficulty  and pain.  Denies chest pain, shortness of breath cough or fever.  He did have 2 episode of loose bowel movements today.  Objective: Vitals:   08/16/22 1935 08/17/22 0447 08/17/22 1100 08/17/22 1447  BP: 133/77 (!) 156/79 139/70 119/70  Pulse: 76 100 82 78  Resp: '16 20 14 16  '$ Temp: 97.8 F (36.6 C) 98.8 F (37.1 C) (!) 100.5 F (38.1 C) 98.7 F (37.1 C)  TempSrc: Oral Oral Oral Oral  SpO2: 97% 96% 97% 96%  Weight:      Height:        Intake/Output Summary (Last 24 hours) at 08/17/2022 1506 Last data filed at 08/17/2022 1100 Gross per 24 hour  Intake 784.69 ml  Output 1625 ml  Net -840.31 ml   Filed Weights   08/15/22 1306 08/16/22 0423  Weight: 98.9 kg 90.7 kg    Physical Examination: Body mass index is 29.53 kg/m.  General:  Average built, not in obvious distress HENT:   No scleral pallor or icterus noted. Oral mucosa is moist.  Chest:  Clear breath sounds.  Diminished breath sounds bilaterally. No crackles or wheezes.  CVS: S1 &S2 heard. No murmur.  Regular rate and rhythm. Abdomen: Soft, nontender, nondistended.  Bowel sounds are heard.   Extremities: No cyanosis, clubbing or edema.  Peripheral pulses are palpable.  Right knee joint swelling. Psych: Alert, awake and oriented, normal mood CNS:  No cranial nerve deficits.  Power equal in all extremities.   Skin: Warm and dry.  No rashes noted.  Data Reviewed:   CBC: Recent Labs  Lab 08/15/22 1259 08/16/22 0342 08/17/22 0235  WBC 12.4* 12.3* 13.4*  NEUTROABS 9.8*  --   --   HGB 10.5* 11.3* 10.6*  HCT 31.5* 33.8* 32.4*  MCV 89.0 86.4 87.6  PLT 232 243 [id number]    Basic Metabolic Panel: Recent Labs  Lab 08/15/22 1259 08/16/22 0342 08/17/22 0235  NA 135 129* 127*  K 3.0* 3.7 3.3*  CL 109 96* 98  CO2 16* 20* 19*  GLUCOSE 106* 136* 134*  BUN '9 11 8  '$ CREATININE 0.97 1.29* 1.18  CALCIUM 6.1* 8.8* 8.2*  MG 1.2* 2.6* 2.2    Liver Function Tests: Recent Labs  Lab 08/16/22 0342  AST 11*  ALT 8  ALKPHOS  48  BILITOT 1.0  PROT 6.4*  ALBUMIN 2.5*     Radiology Studies: CT Angio Chest PE W and/or Wo Contrast  Result Date: 08/15/2022 CLINICAL DATA:  Chest pain, shortness of breath EXAM: CT ANGIOGRAPHY CHEST WITH CONTRAST TECHNIQUE: Multidetector CT imaging of the chest was performed using the standard protocol during bolus administration of intravenous contrast. Multiplanar CT image reconstructions and MIPs were obtained to evaluate the vascular anatomy. RADIATION DOSE REDUCTION: This exam was performed according to the departmental dose-optimization program which includes automated exposure control, adjustment of the mA and/or kV according to patient size and/or use of iterative reconstruction technique. CONTRAST:  43m OMNIPAQUE IOHEXOL 350 MG/ML SOLN COMPARISON:  07/08/2022 FINDINGS: Cardiovascular: Satisfactory opacification the bilateral pulmonary  arteries to the lobar level. No evidence of pulmonary embolism. Stable 4.5 cm ascending thoracic aortic aneurysm. 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 Atherosclerotic calcifications of the aortic arch. Heart is normal in size. Small inferior pericardial effusion, unchanged. Moderate three-vessel coronary atherosclerosis. Mediastinum/Nodes: No suspicious mediastinal lymphadenopathy. Visualized thyroid is unremarkable. Lungs/Pleura: Mild dependent atelectasis in the bilateral lower lobes. Trace left pleural effusion. No suspicious pulmonary nodules. No pneumothorax. Upper Abdomen: Visualized upper abdomen is grossly unremarkable. Musculoskeletal: Degenerative changes of the visualized thoracolumbar spine. Review of the MIP images confirms the above findings. IMPRESSION: No evidence of pulmonary embolism. Stable 4.5 cm ascending thoracic aortic aneurysm. 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 Trace left pleural effusion. Aortic Atherosclerosis (ICD10-I70.0).  Electronically Signed   By: Julian Hy M.D.   On: 08/15/2022 20:22      LOS: 1 day    Flora Lipps, MD [facility] Available via Epic secure chat 7am-7pm After these hours, please refer to coverage provider listed on [email or web address] 08/17/2022, 3:06 PM

## 2022-08-17 NOTE — NC FL2 (Signed)
[place] LEVEL OF CARE FORM     IDENTIFICATION  Patient Name: Donald Calderon Birthdate: [birth year] Sex: male Admission Date (Current Location): 08/15/2022  [facility] and [place] Number:  Herbalist and Address:  [facility] [place], [place] [address], [place], [place] [address]      Provider Number: [id number]  Attending Physician Name and Address:  Flora Lipps, MD  Relative Name and Phone Number:  [name] (daughter) [phone]    Current Level of Care: Hospital Recommended Level of Care: [place] Prior Approval Number:    Date Approved/Denied:   PASRR Number: [id number]  Discharge Plan: SNF    Current Diagnoses: Patient Active Problem List   Diagnosis Date Noted   Chest pain 08/16/2022   Shortness of breath 08/16/2022   Cough 08/16/2022   Diarrhea 08/16/2022   Hypomagnesemia 08/16/2022   Hypocalcemia 08/16/2022   Normal anion gap metabolic acidosis [id number]   History of alcohol abuse 08/16/2022   Slurred speech 07/03/2021   Sepsis due to undetermined organism ([place]) 05/11/2019   Hyperkalemia 05/11/2019   Hyperbilirubinemia 05/11/2019   Pressure injury of skin 03/10/2017   UTI (urinary tract infection) [id number]   Acute metabolic encephalopathy [id number]   Encephalopathy 03/07/2017   Normocytic anemia 02/23/2017   Neurogenic bladder 02/23/2017   AKI (acute kidney injury) ([place]) 02/23/2017   Sepsis secondary to UTI ([place]) 02/22/2017   Postoperative ileus ([place]) 02/12/2017   Urine retention 01/29/2017   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   Lumbar stenosis 01/26/2017   Diabetes mellitus type 2 in obese ([place])    Hypertension associated with diabetes ([place])    ETOH abuse    Chronic pain syndrome    Hyponatremia    Leukocytosis    Acute blood loss anemia    Epidural hematoma ([place]) 01/23/2017   Status post lumbar surgery 01/21/2017   Status post lumbar spine operation 01/21/2017   Chronic back pain  09/27/2011   NEUROPATHY 08/08/2010   SWELLING MASS OR LUMP IN HEAD AND NECK 06/27/2010   BENIGN PROSTATIC HYPERTROPHY, WITH URINARY OBSTRUCTION 06/19/2010   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   Hypokalemia 03/18/2010   LIPOMA 02/14/2010   Type 2 diabetes mellitus ([place]) 02/07/2010   HYPERLIPIDEMIA 02/07/2010   Alcohol abuse 02/07/2010   HYPERTENSION 02/07/2010   SPINAL STENOSIS, LUMBAR 02/07/2010    Orientation RESPIRATION BLADDER Height & Weight     Self, Time, Situation, Place (WDL)  Normal Continent, External catheter (External Urinary Catheter) Weight: 199 lb 15.3 oz (90.7 kg) Height:  '5\' 9"'$  (175.3 cm)  BEHAVIORAL SYMPTOMS/MOOD NEUROLOGICAL BOWEL NUTRITION STATUS      Continent Diet (Please see discharge summary)  AMBULATORY STATUS COMMUNICATION OF NEEDS Skin   Extensive Assist Verbally Other (Comment) (Appropriate for ethnicity,dry,intact,non-tenting,Wound/Incisions LDAs)                       Personal Care Assistance Level of Assistance  Bathing, Feeding, Dressing Bathing Assistance: Maximum assistance Feeding assistance: Independent Dressing Assistance: Maximum assistance     Functional Limitations Info  Sight, Hearing, Speech Sight Info: 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) Hearing Info: Adequate Speech Info: Adequate    SPECIAL CARE FACTORS FREQUENCY  PT (By licensed PT), OT (By licensed OT)     PT Frequency: 5x min weekly OT Frequency: 5x min weekly            Contractures Contractures Info: Not present    Additional  Factors Info  Code Status, Allergies, Insulin Sliding Scale Code Status Info: FULL Allergies Info: Shellfish Allergy,Penicillins   Insulin Sliding Scale Info: insulin aspart (novoLOG) injection 0-5 Units daily at bedtime,insulin aspart (novoLOG) injection 0-9 Units 3 times daily with meals       Current Medications (08/17/2022):  This is the current hospital active medication list Current  Facility-Administered Medications  Medication Dose Route Frequency Provider Last Rate Last Admin   0.9 %  sodium chloride infusion   Intravenous Continuous Pahwani, Rinka R, MD 100 mL/hr at 08/17/22 0700 Infusion Verify at 08/17/22 0700   acetaminophen (TYLENOL) tablet 650 mg  650 mg Oral Q6H PRN Shela Leff, MD   650 mg at 08/17/22 1308   Or   acetaminophen (TYLENOL) suppository 650 mg  650 mg Rectal Q6H PRN Shela Leff, MD       alum & mag hydroxide-simeth (MAALOX/MYLANTA) 200-200-20 MG/5ML suspension 30 mL  30 mL Oral Q6H PRN Pahwani, Rinka R, MD   30 mL at 08/16/22 1259   cefTRIAXone (ROCEPHIN) 1 g in sodium chloride 0.9 % 100 mL IVPB  1 g Intravenous Q24H Shela Leff, MD   Stopped at 08/16/22 2225   gabapentin (NEURONTIN) capsule 300 mg  300 mg Oral BID WC Pahwani, Rinka R, MD   300 mg at 08/17/22 1259   gabapentin (NEURONTIN) capsule 600 mg  600 mg Oral QHS Mansy, Jan A, MD   600 mg at 08/17/22 0022   guaiFENesin-dextromethorphan (ROBITUSSIN DM) 100-10 MG/5ML syrup 5 mL  5 mL Oral Q4H PRN Shela Leff, MD   5 mL at 08/16/22 1044   insulin aspart (novoLOG) injection 0-5 Units  0-5 Units Subcutaneous QHS Shela Leff, MD       insulin aspart (novoLOG) injection 0-9 Units  0-9 Units Subcutaneous TID WC Shela Leff, MD   1 Units at 08/17/22 1259   nitroGLYCERIN (NITROSTAT) SL tablet 0.4 mg  0.4 mg Sublingual Q5 min PRN Pahwani, Rinka R, MD   0.4 mg at 08/16/22 1225   saccharomyces boulardii (FLORASTOR) capsule 250 mg  250 mg Oral BID Pokhrel, Corrie Mckusick, MD         Discharge Medications: Please see discharge summary for a list of discharge medications.  Relevant Imaging Results:  Relevant Lab Results:   Additional Information [phone]  Milas Gain, LCSWA

## 2022-08-17 NOTE — Progress Notes (Signed)
Attempted Echocardiogram, The patient is not in the bed. Spoke with the patient's Nurse, she said this is not a good time and that the patient needs to sit up for a while in the chair. The nurse requested to come back in a few hours to complete the Echocardiogram.

## 2022-08-17 NOTE — Evaluation (Signed)
Physical Therapy Evaluation Patient Details Name: Donald Calderon MRN: [id number] DOB: 30-Jan-1941 Today's Date: 08/17/2022  History of Present Illness  Pt is an 82 y.o. male who presented 08/15/22 with SOB and chest pain. Troponin negative x 2. 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 Pt with UTI. 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 PTA, he was mod I using a rollator for functional mobility, living alone in a 1-level house with 2 STE. Pt's daughter manages his pill box and finances and often brings him food or cooks for him. He can do some light cooking. His neighbor friend does most of the cleaning and drives him to appointments. Pt dresses and bathes himself at baseline. Pt reports a hx of his knee joint "going out of socket", which he thinks is what occurred with his R knee that is causing his R knee extreme pain. He describes the pain as achy with moments of sharp pain, but reports the pain now is more than his usual pain he experiences when "the knee goes out of place", believing it is due to being in the bed for the past 2 days. Upon palpation, his patella appeared to track appropriately but there was a lot of edema and his R knee was warm to the touch compared to his L. He denies any hx of gout. His R knee pain is limiting his R knee ROM, R leg weightbearing tolerance, functional mobility, balance, and activity tolerance. He also is generally weak. Pt required modA for bed mobility, maxA to come to a half stand with the RW, and modA to laterally scoot transfer to his L from the bed to the  chair, needing support at his R leg throughout. As pt has had a drastic functional decline, reports between his neighbor friend and daughter he can get 24/7 care at d/c, and is motivated to improve, recommending intensive therapy at [facility]. Will continue to follow acutely and assess pt's progress and update d/c recs as is appropriate.      Recommendations for follow up therapy are one component of a multi-disciplinary discharge planning process, led by the attending physician.  Recommendations may be updated based on patient status, additional functional criteria and insurance authorization.  Follow Up Recommendations Acute inpatient rehab (3hours/day) (pending progress)      Assistance Recommended at Discharge Intermittent Supervision/Assistance  Patient can return home with the following  Two people to help with walking and/or transfers;A lot of help with bathing/dressing/bathroom;Assistance with cooking/housework;Direct supervision/assist for medications management;Direct supervision/assist for financial management;Assist for transportation;Help with stairs or ramp for entrance    Equipment Recommendations BSC/3in1  Recommendations for Other Services  OT consult;Rehab consult    Functional Status Assessment Patient has had a recent decline in their functional status and demonstrates the ability to make significant improvements in function in a reasonable and predictable amount of time.     Precautions / Restrictions Precautions Precautions: Fall Restrictions Weight Bearing Restrictions: No      Mobility  Bed Mobility Overal bed mobility: Needs Assistance Bed Mobility: Supine to Sit     Supine to sit: Mod assist, HOB elevated     General bed  mobility comments: Extra time with PT supporting pt's R foot and posterior aspect of knee with bringing leg off R EOB to sit up, cuing pt for use of bed rail and to pull up on therapist's arm as needed to ascend trunk    Transfers Overall  transfer level: Needs assistance Equipment used: Rolling walker (2 wheels), None Transfers: Sit to/from Stand, Bed to chair/wheelchair/BSC Sit to Stand: Max assist, From elevated surface          Lateral/Scoot Transfers: Mod assist General transfer comment: Pt coming to stand x2 reps, but only to about 1/2 stand due to inability to extend hips, knees, and trunk due to R knee pain. 1x from low bed surface and 1x from elevated EOB, maxA each rep. Transitioned to transferring from bed to chair to his L with arm rest of chair dropped via lateral scooting instead due to the pain, needing guidance due to vision deficits, scrotal support when going between bed and chair due to it almost getting stuck, assistance to place R foot between scoots, and physical assistance to transition weight laterally when scooting.    Ambulation/Gait               General Gait Details: unable  Stairs            Wheelchair Mobility    Modified Rankin (Stroke Patients Only)       Balance Overall balance assessment: Needs assistance Sitting-balance support: No upper extremity supported, Feet supported Sitting balance-Leahy Scale: Fair     Standing balance support: Bilateral upper extremity supported, During functional activity, Reliant on assistive device for balance Standing balance-Leahy Scale: Zero Standing balance comment: Unable to stand fully upright due to R knee pain, maxA using RW                             Pertinent Vitals/Pain Pain Assessment Pain Assessment: Faces Faces Pain Scale: Hurts whole lot Pain Location: R knee with palpation and ROM Pain Descriptors / Indicators: Discomfort, Grimacing, Moaning, Sharp, Aching Pain Intervention(s): Limited activity within patient's tolerance, Monitored during session, Repositioned, Patient requesting pain meds-RN notified, Ice applied    Home Living Family/patient expects to be discharged to:: Private residence Living  Arrangements: Alone Available Help at Discharge: Family;Friend(s);Available 24 hours/day (daughter works but Industrial/product designer is a friend and is retired) Type of Home: [employer] Access: Stairs to enter Entrance Stairs-Rails: Can reach both Technical brewer of Steps: 2   [place]: One [place]: Conservation officer, nature (2 wheels);Rollator (4 wheels);Wheelchair - manual;Hospital bed;Grab bars - toilet;Grab bars - tub/shower;Shower seat      Prior Function Prior Level of Function : Needs assist       Physical Assist : ADLs (physical)     Mobility Comments: Mod I using his rollator for functional mobility, x1 fall 1 month ago but denies any injuries ADLs Comments: Daughter manages pill box and finances and often brings him food or cooks for him. He can do light cooking. Neighbor friend does most of the cleaning and drives him to appointments. Pt dresses and bathes himself. 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     Hand Dominance        Extremity/Trunk Assessment   Upper Extremity Assessment Upper Extremity Assessment: Defer to OT evaluation    Lower Extremity Assessment Lower Extremity Assessment: Generalized weakness;RLE deficits/detail;LLE deficits/detail RLE Deficits / Details: R knee very painful to  palpation and all ROM (more difficulty with extension than flexion); limited knee extension AAROM to about -15 degrees, and limited knee flexion AAROM to only achieving about 55 degrees flexion; palpated patella and seemed to track appropriately with knee ROM, but difficult to fully assess due to pain and edema, pt reporting it felt like the "knee was out of place" or "out of the joint" which he reports happens intermittently and hurts but this is worse pain than normal but he had also been in bed for past 2 days; edema and warmth to palpation noted around knee; gross weakness noted bil; hx of peripheral neuropathy bil RLE Sensation: history of peripheral  neuropathy RLE Coordination: decreased gross motor LLE Deficits / Details: mild L knee soreness with knee flexion/extension AROM; gross weakness noted bil; hx of peripheral neuropathy bil LLE Sensation: history of peripheral neuropathy LLE Coordination: decreased gross motor    Cervical / Trunk Assessment Cervical / Trunk Assessment: Normal  Communication   Communication: HOH  Cognition Arousal/Alertness: Awake/alert Behavior During Therapy: WFL for tasks assessed/performed Overall Cognitive Status: Within Functional Limits for tasks assessed                                          General Comments General comments (skin integrity, edema, etc.): encouraged continued mobility, AROM of knees, and ice use to try to reduce pain; pt denies any hx of gout    Exercises General Exercises - Lower Extremity Long Arc Quad: AROM, AAROM, Both, 5 reps, Seated (AROM L, AAROM R)   Assessment/Plan    PT Assessment Patient needs continued PT services  PT Problem List Decreased strength;Decreased range of motion;Decreased activity tolerance;Decreased balance;Decreased mobility;Impaired sensation;Pain       PT Treatment Interventions DME instruction;Gait training;Stair training;Functional mobility training;Therapeutic activities;Balance training;Therapeutic exercise;Neuromuscular re-education;Patient/family education;Wheelchair mobility training    PT Goals (Current goals can be found in the Care Plan section)  Acute Rehab PT Goals Patient Stated Goal: to reduce his R knee pain PT Goal Formulation: With patient Time For Goal Achievement: 08/31/22 Potential to Achieve Goals: Good    Frequency Min 3X/week     Co-evaluation               AM-PAC PT "6 Clicks" Mobility  Outcome Measure Help needed turning from your back to your side while in a flat bed without using bedrails?: A Lot Help needed moving from lying on your back to sitting on the side of a flat bed without  using bedrails?: A Lot Help needed moving to and from a bed to a chair (including a wheelchair)?: A Lot Help needed standing up from a chair using your arms (e.g., wheelchair or bedside chair)?: A Lot Help needed to walk in hospital room?: Total Help needed climbing 3-5 steps with a railing? : Total 6 Click Score: 10    End of Session Equipment Utilized During Treatment: Gait belt Activity Tolerance: Patient limited by pain Patient left: in chair;with call bell/phone within reach;with chair alarm set Nurse Communication: Mobility status PT Visit Diagnosis: Unsteadiness on feet (R26.81);Muscle weakness (generalized) (M62.81);Difficulty in walking, not elsewhere classified (R26.2);Pain Pain - Right/Left: Right Pain - part of body: Knee    Time: 1210-1257 PT Time Calculation (min) (ACUTE ONLY): 47 min   Charges:   PT Evaluation $PT Eval Moderate Complexity: 1 Mod PT Treatments $Therapeutic Activity: 23-37 mins  Moishe Spice, PT, DPT Acute Rehabilitation Services  Office: [place] 08/17/2022, 1:35 PM

## 2022-08-17 NOTE — Progress Notes (Signed)
Inpatient Rehab Admissions Coordinator:   Per therapy recommendations, patient was screened for CIR candidacy by Clemens Catholic, MS, CCC-SLP. At this time, Pt. does not appear to demonstrate medical necessity to justify in hospital rehabilitation/CIR. I do not think payor will approve for this diagnosis either, and I will not pursue a rehab consult for this Pt.   Recommend other rehab venues to be pursued.  Please contact me with any questions.   Clemens Catholic, [place], [place] Admissions Coordinator  [phone] ([place]) [phone] (office)

## 2022-08-17 NOTE — TOC Initial Note (Signed)
Transition of Care [facility]) - Initial/Assessment Note    Patient Details  Name: SAYYID TRNKA MRN: [id number] Date of Birth: [birth year]  Transition of Care [facility]) CM/SW Contact:    Milas Gain, [place] Phone Number: 08/17/2022, 3:42 PM  Clinical Narrative:                   CSW received consult for possible SNF placement at time of discharge. CSW spoke with patient at bedside regarding PT recommendation of SNF placement at time of discharge. Patient reports PTA he comes from home alone.Patient expressed understanding of PT recommendation and is agreeable to SNF placement at time of discharge. Patient gave CSW permission to fax out initial referral near the [facility] area for possible SNF placement. CSW discussed insurance authorization process with patient.No further questions reported at this time. CSW to continue to follow and assist with discharge planning needs.         Patient Goals and CMS Choice            Expected Discharge Plan and Services                                              Prior Living Arrangements/Services                       Activities of Daily Living Home Assistive Devices/Equipment: None, Wheelchair, Environmental consultant (specify type) ADL Screening (condition at time of admission) Patient's cognitive ability adequate to safely complete daily activities?: Yes Is the patient deaf or have difficulty hearing?: No Does the patient have difficulty seeing, even when wearing glasses/contacts?: Yes Does the patient have difficulty concentrating, remembering, or making decisions?: No Patient able to express need for assistance with ADLs?: Yes Does the patient have difficulty dressing or bathing?: Yes Independently performs ADLs?: No Communication: Needs assistance Is this a change from baseline?: Pre-admission baseline Dressing (OT): Needs assistance Is this a change from baseline?: Pre-admission baseline Grooming: Needs assistance Is this a  change from baseline?: Pre-admission baseline Feeding: Needs assistance Is this a change from baseline?: Pre-admission baseline Bathing: Needs assistance Is this a change from baseline?: Pre-admission baseline Toileting: Needs assistance Is this a change from baseline?: Pre-admission baseline Does the patient have difficulty walking or climbing stairs?: Yes Weakness of Legs: None Weakness of Arms/Hands: None  Permission Sought/Granted                  Emotional Assessment              Admission diagnosis:  Hypocalcemia [E83.51] Hypokalemia [E87.6] Hypomagnesemia [E83.42] UTI (urinary tract infection) [N39.0] Acute lower UTI [N39.0] Patient Active Problem List   Diagnosis Date Noted   Chest pain 08/16/2022   Shortness of breath 08/16/2022   Cough 08/16/2022   Diarrhea 08/16/2022   Hypomagnesemia 08/16/2022   Hypocalcemia 08/16/2022   Normal anion gap metabolic acidosis [id number]   History of alcohol abuse 08/16/2022   Slurred speech 07/03/2021   Sepsis due to undetermined organism ([place]) 05/11/2019   Hyperkalemia 05/11/2019   Hyperbilirubinemia 05/11/2019   Pressure injury of skin 03/10/2017   UTI (urinary tract infection) [id number]   Acute metabolic encephalopathy [id number]   Encephalopathy 03/07/2017   Normocytic anemia 02/23/2017   Neurogenic bladder 02/23/2017   AKI (acute kidney injury) ([place]) 02/23/2017   Sepsis secondary to UTI ([place]) 02/22/2017   Postoperative  ileus ([place]) 02/12/2017   Urine retention 01/29/2017   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   Lumbar stenosis 01/26/2017   Diabetes mellitus type 2 in obese [facility])    Hypertension associated with diabetes ([place])    ETOH abuse    Chronic pain syndrome    Hyponatremia    Leukocytosis    Acute blood loss anemia    Epidural hematoma ([place]) 01/23/2017   Status post lumbar surgery 01/21/2017   Status post lumbar spine operation 01/21/2017   Chronic back pain 09/27/2011   NEUROPATHY 08/08/2010    SWELLING MASS OR LUMP IN HEAD AND NECK 06/27/2010   BENIGN PROSTATIC HYPERTROPHY, WITH URINARY OBSTRUCTION 06/19/2010   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   Hypokalemia 03/18/2010   LIPOMA 02/14/2010   Type 2 diabetes mellitus ([place]) 02/07/2010   HYPERLIPIDEMIA 02/07/2010   Alcohol abuse 02/07/2010   HYPERTENSION 02/07/2010   SPINAL STENOSIS, LUMBAR 02/07/2010   PCP:  Medicine, [facility]:   [place], [place] - [place] AT [facility] [address] [place] [address] Phone: [phone] Fax: [phone]     Social Determinants of Health (SDOH) Social History: SDOH Screenings   Food Insecurity: No Food Insecurity (08/16/2022)  Housing: Low Risk  (08/16/2022)  Transportation Needs: No Transportation Needs (08/16/2022)  Utilities: Not At Risk (08/16/2022)  Tobacco Use: Medium Risk (08/16/2022)   SDOH Interventions:     Readmission Risk Interventions     No data to display

## 2022-08-18 ENCOUNTER — Inpatient Hospital Stay (HOSPITAL_COMMUNITY): Payer: Medicare HMO

## 2022-08-18 DIAGNOSIS — R079 Chest pain, unspecified: Secondary | ICD-10-CM | POA: Diagnosis not present

## 2022-08-18 DIAGNOSIS — R197 Diarrhea, unspecified: Secondary | ICD-10-CM | POA: Diagnosis not present

## 2022-08-18 DIAGNOSIS — N390 Urinary tract infection, site not specified: Secondary | ICD-10-CM | POA: Diagnosis not present

## 2022-08-18 DIAGNOSIS — R051 Acute cough: Secondary | ICD-10-CM | POA: Diagnosis not present

## 2022-08-18 LAB — CBC
HCT: 30.3 % — ABNORMAL LOW (ref 39.0–52.0)
Hemoglobin: 10 g/dL — ABNORMAL LOW (ref 13.0–17.0)
MCH: 28.7 pg (ref 26.0–34.0)
MCHC: 33 g/dL (ref 30.0–36.0)
MCV: 87.1 fL (ref 80.0–100.0)
Platelets: 258 10*3/uL (ref 150–400)
RBC: 3.48 MIL/uL — ABNORMAL LOW (ref 4.22–5.81)
RDW: 13.5 % (ref 11.5–15.5)
WBC: 9.8 10*3/uL (ref 4.0–10.5)
nRBC: 0 % (ref 0.0–0.2)

## 2022-08-18 LAB — BASIC METABOLIC PANEL
Anion gap: 6 (ref 5–15)
BUN: 11 mg/dL (ref 8–23)
CO2: 22 mmol/L (ref 22–32)
Calcium: 8.1 mg/dL — ABNORMAL LOW (ref 8.9–10.3)
Chloride: 101 mmol/L (ref 98–111)
Creatinine, Ser: 1.27 mg/dL — ABNORMAL HIGH (ref 0.61–1.24)
GFR, Estimated: 57 mL/min — ABNORMAL LOW (ref >60)
Glucose, Bld: 161 mg/dL — ABNORMAL HIGH (ref 70–99)
Potassium: 3.4 mmol/L — ABNORMAL LOW (ref 3.5–5.1)
Sodium: 129 mmol/L — ABNORMAL LOW (ref 135–145)

## 2022-08-18 LAB — GLUCOSE, CAPILLARY
Glucose-Capillary: 147 mg/dL — ABNORMAL HIGH (ref 70–99)
Glucose-Capillary: 209 mg/dL — ABNORMAL HIGH (ref 70–99)
Glucose-Capillary: 211 mg/dL — ABNORMAL HIGH (ref 70–99)
Glucose-Capillary: 309 mg/dL — ABNORMAL HIGH (ref 70–99)

## 2022-08-18 LAB — ECHOCARDIOGRAM COMPLETE
Area-P 1/2: 2.74 cm2
Calc EF: 71.6 %
Height: 69 in
S' Lateral: 2.3 cm
Single Plane A2C EF: 74.9 %
Single Plane A4C EF: 68.2 %
Weight: 3199.32 oz

## 2022-08-18 LAB — MAGNESIUM: Magnesium: 2.3 mg/dL (ref 1.7–2.4)

## 2022-08-18 MED ORDER — ACETAMINOPHEN 650 MG RE SUPP: 650.0000 mg | Freq: Four times a day (QID) | RECTAL | Status: DC | PRN

## 2022-08-18 MED ORDER — PANTOPRAZOLE SODIUM 40 MG PO TBEC
40.0000 mg | DELAYED_RELEASE_TABLET | Freq: Every day | ORAL | Status: DC
  Administered 2022-08-18 – 2022-08-21 (×4): 40 mg via ORAL
  Filled 2022-08-18 (×4): qty 1

## 2022-08-18 MED ORDER — PREDNISONE 20 MG PO TABS
30.0000 mg | ORAL_TABLET | Freq: Every day | ORAL | Status: DC
  Administered 2022-08-18 – 2022-08-19 (×2): 30 mg via ORAL
  Filled 2022-08-18 (×2): qty 1

## 2022-08-18 MED ORDER — ACETAMINOPHEN 325 MG PO TABS
650.0000 mg | ORAL_TABLET | Freq: Four times a day (QID) | ORAL | Status: DC | PRN
  Administered 2022-08-19: 650 mg via ORAL
  Filled 2022-08-18: qty 2

## 2022-08-18 MED ORDER — DICLOFENAC SODIUM 1 % EX GEL
2.0000 g | Freq: Four times a day (QID) | CUTANEOUS | Status: DC
Start: 2022-08-18 — End: 2022-08-21
  Administered 2022-08-18 – 2022-08-21 (×13): 2 g via TOPICAL
  Filled 2022-08-18: qty 100

## 2022-08-18 MED ORDER — POTASSIUM CHLORIDE CRYS ER 20 MEQ PO TBCR
40.0000 meq | EXTENDED_RELEASE_TABLET | Freq: Once | ORAL | Status: AC
  Administered 2022-08-18: 40 meq via ORAL
  Filled 2022-08-18: qty 2

## 2022-08-18 NOTE — Evaluation (Signed)
Occupational Therapy Evaluation Patient Details Name: Donald Calderon MRN: [id number] DOB: 03-07-1941 Today's Date: 08/18/2022   History of Present Illness Pt is an 82 y.o. male who presented 08/15/22 with SOB and chest pain. Troponin negative x 2. 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 Pt with UTI. 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 Pt was seen for limited bedside eval today as he has significant c/o pain in his right knee. He declined all OOB activity and was +2 assist for bed mobility/rolling for hygeine and care. He is Min A upper body bathing and dressing at bed level, Max-total assist +2 for lower body bathing, dressing. His sheets were soiled upon OT arrival. OT assisted RN in changing linens and cleaningpt up at bed level. He was educated in role of OT and goal of increasing independence with ADL's and self care tasks as he reports Mod I basic ADL's and toilet transfers prior to this admission. Pt verbalized understanding of this but states that he is "not going to do anything until a knee doctor looks at that knee" as pain is currently a limiting factor overall. Pt will likely need SNF Rehab as he lives alone with family and friend assist at baseline level.      Recommendations for follow up therapy are one component of a multi-disciplinary discharge planning process, led by the attending physician.  Recommendations may be updated based on patient status, additional functional criteria and insurance authorization.   Follow Up Recommendations  Skilled nursing-short  term rehab (<3 hours/day)     Assistance Recommended at Discharge Frequent or constant Supervision/Assistance  Patient can return home with the following Two people to help with walking and/or transfers;Two people to help with bathing/dressing/bathroom;Assistance with cooking/housework;Assist for transportation;Help with stairs or ramp for entrance    Functional Status Assessment  Patient has had a recent decline in their functional status and demonstrates the ability to make significant improvements in function in a reasonable and predictable amount of time.  Equipment Recommendations  Other (comment) (defer to next venue)    Recommendations for Other Services       Precautions / Restrictions Precautions Precautions: Fall;Other (comment) (h/o right knee pain) Restrictions Weight Bearing Restrictions: No      Mobility Bed Mobility Overal bed mobility: Needs Assistance Bed Mobility: Rolling Rolling: Max assist, Mod assist, +2 for physical assistance, +2 for safety/equipment         General bed mobility comments: Pt declining sitting up at EOB secondary to right knee pain. RN is aware    Transfers   General transfer comment:  (Pt declining all OOB activity secondary to R knee pain. He was currently +2 Max assist for rolling in bed for self care)      Balance Overall balance assessment:  (Pt declined sitting up at EOB secondary to right knee pain)     ADL either performed or assessed with clinical judgement   ADL Overall ADL's : Needs assistance/impaired Eating/Feeding: Set up;Bed level   Grooming: Set up;Bed level   Upper Body Bathing: Set up;Minimal assistance;Bed level   Lower Body Bathing: Total assistance;+2 for physical  assistance;+2 for safety/equipment;Bed level Lower Body Bathing Details (indicate cue type and reason): Pt currently +2 assist for rolling and bathing at bed level secondary to right knee pain. Pt declined OOB activity "Until a knee doctor looks  at it" RN made aware Upper Body Dressing : Minimal assistance;Bed level   Lower Body Dressing: Maximal assistance;Total assistance;+2 for physical assistance;+2 for safety/equipment;Bed level   Toilet Transfer: +2 for physical assistance;Total assistance;+2 for safety/equipment Toilet Transfer Details (indicate cue type and reason): Bed level - pt declining OOB activity until "a knee doctor can look at what is wrong with my knee" right Toileting- Clothing Manipulation and Hygiene: +2 for physical assistance;Maximal assistance;Total assistance;+2 for safety/equipment;Bed level       Functional mobility during ADLs:  (Pt declined OOB activity secondary to right knee pain - he is currently +2 max-total assist for rolling side to side in bed to change wet/soiled linens) General ADL Comments: Pt was seen for limited bedside eval today as he has significant c/o pain in his right knee. He declined all OOB activity and was +2 assist for bed mobility/rolling for hygeine and care. He is Min A upper body bathing and dressing at bed level, Max-total assist +2 for lower body bathing, dressing. His sheets were soiled upon OT arrival. OT assisted RN in changing linens and cleaningpt up at bed level. He was educated in role of OT and goal of increasing independence with ADL's and self care tasks. Pt verbalized understanding of this but states that he is "not going to do anything until a knee doctor looks at that knee" as pain is currently a limiting factor overall. Pt will likely need SNF Rehab as he lives alone with family and friend assist at baseline level.     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)              Pertinent Vitals/Pain Pain Assessment Pain Assessment: Faces Faces  Pain Scale: Hurts whole lot Pain Location: R knee with palpation and ROM, rolling in bed with +2 assist Pain Descriptors / Indicators: Discomfort, Grimacing, Moaning, Sharp, Aching Pain Intervention(s): Limited activity within patient's tolerance, Monitored during session, Repositioned     Hand Dominance Right   Extremity/Trunk Assessment Upper Extremity Assessment Upper Extremity Assessment: Generalized weakness;Overall [facility] for tasks assessed   Lower Extremity Assessment Lower Extremity Assessment: Defer to PT evaluation   Cervical / Trunk Assessment Cervical / Trunk Assessment: Normal   Communication Communication Communication: HOH;Other (comment) (Blindness)   Cognition Arousal/Alertness: Awake/alert Behavior During Therapy: WFL for tasks assessed/performed Overall Cognitive Status: Within Functional Limits for tasks assessed       General Comments  encouraged continued mobility, AROM of knees, and ice use to try to reduce pain; pt denies any hx of gout. Pt politely declining all OOB activity "until a knee doctor" looks at him - pain is limiting factor            Home Living Family/patient expects to be discharged to:: Private residence Living Arrangements: Alone Available Help at Discharge: Family;Friend(s);Available 24 hours/day (Daughter works but Industrial/product designer is a friend and is retired) Type of Home: [employer] Access: Stairs to enter Technical brewer of Steps: 2 Entrance Stairs-Rails: Can reach both Outlook: One level     Bathroom Shower/Tub: [place]  unit   Bathroom Toilet: Handicapped height (Has toilet riser on it) Bathroom Accessibility: Yes   Home Equipment: [place] (2 wheels);Rollator (4 wheels);Wheelchair - manual;Hospital bed;Grab bars - toilet;Grab bars - tub/shower;Shower seat          Prior Functioning/Environment Prior Level of Function : Needs assist       Physical Assist : ADLs (physical)     Mobility Comments: Mod I  using his rollator for functional mobility, x1 fall 1 month ago but denies any injuries ADLs Comments: Daughter manages pill box and finances and often brings him food or cooks for him. He can do light cooking. Neighbor friend does most of the cleaning and drives him to appointments. Pt dresses and bathes himself. 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        OT Problem List: Impaired balance (sitting and/or standing);Pain;Decreased activity tolerance;Decreased knowledge of use of DME or AE;Impaired vision/perception (pt is blind in left eye, right eye only sees shapes at baseline)      OT Treatment/Interventions: Self-care/ADL training;Patient/family education;DME and/or AE instruction;Therapeutic activities;Energy conservation    OT Goals(Current goals can be found in the care plan section) Acute Rehab OT Goals Patient Stated Goal: Have a knee doctor look at my knee, I'm not moving until then OT Goal Formulation: With patient Time For Goal Achievement: 09/01/22 Potential to Achieve Goals: Fair  OT Frequency: Min 2X/week       AM-PAC OT "6 Clicks" Daily Activity     Outcome Measure Help from another person eating meals?: A Little Help from another person taking care of personal grooming?: A Little Help from another person toileting, which includes using toliet, bedpan, or urinal?: A Lot Help from another person bathing (including washing, rinsing, drying)?: A Lot Help from another person to put on and taking off regular upper body clothing?: A Little Help from another person to put on and taking off regular lower body clothing?: Total 6 Click Score: 14   End of Session Nurse Communication: Mobility status;Other (comment) (Pt requesting knee doctor to tell him what is going on, pain is limiting factor)  Activity Tolerance: Patient limited by pain Patient left: in bed;with call bell/phone within reach;with bed alarm set  OT Visit Diagnosis: Muscle weakness  (generalized) (M62.81);Unsteadiness on feet (R26.81);Pain Pain - Right/Left: Right Pain - part of body: Knee                Time: [id number] OT Time Calculation (min): 17 min Charges:  OT General Charges $OT Visit: 1 Visit OT Evaluation $OT Eval Low Complexity: 1 Low  Breeze Berringer Beth Dixon, OTR/L 08/18/2022, 10:47 AM

## 2022-08-18 NOTE — Progress Notes (Signed)
Echocardiogram 2D Echocardiogram has been performed.  Donald Calderon 08/18/2022, 11:23 AM

## 2022-08-18 NOTE — TOC Progression Note (Signed)
Transition of Care [facility]) - Progression Note    Patient Details  Name: Donald Calderon MRN: [id number] Date of Birth: Dec 01, 1940  Transition of Care [facility]) CM/SW [place], [place] Phone Number: 08/18/2022, 4:53 PM  Clinical Narrative:     CSW provided patient and patients daughter at bedside with SNF bed offers for patient. Patient accepted SNF bed offer with [facility] and rehab. CSW awaiting call back from [place] with [facility] to confirm SNF bed. CSW will continue to follow and assist with patients dc planning needs.       Expected Discharge Plan and Services                                               Social Determinants of Health (SDOH) Interventions SDOH Screenings   Food Insecurity: No Food Insecurity (08/16/2022)  Housing: Low Risk  (08/16/2022)  Transportation Needs: No Transportation Needs (08/16/2022)  Utilities: Not At Risk (08/16/2022)  Tobacco Use: Medium Risk (08/16/2022)    Readmission Risk Interventions     No data to display

## 2022-08-18 NOTE — Progress Notes (Addendum)
PROGRESS NOTE    Donald Calderon  [id number] DOB: 05-27-1941 DOA: 08/15/2022 PCP: Medicine, [facility]    Brief Narrative:  Donald Donald Calderon is a 82 y.o. 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 Caffie Pinto as outpatient, type 2 diabetes with neuropathy, hypertension, hyperlipidemia, obesity, BPH, anxiety, depression, GERD presented to hospital with shortness of breath and chest pain and was brought in the hospital when his vitals were noted to be stable.  WBC was elevated at 12.4 with hypokalemia at 3.0.  Troponins were negative.  Magnesium was low at 1.2.  COVID/influenza/RSV PCR negative.  UA with signs of infection (large amount of leukocytes and microscopy showing >50 WBCs and many bacteria).  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  Trace left pleural effusion seen. Patient was given oral potassium 40 mEq, IV calcium gluconate 1 g, ceftriaxone, and IV magnesium 2 gm and TRH was consulted for admission to the hospital.  Assessment & Plan:   Chest pain: EKG without acute findings.  Troponins were negative.  CTA of the chest was negative for pulmonary embolism.  Likely atypical chest pain.  2D echocardiogram pending.  Ascending thoracic aortic aneurysm: -Noted on CT.  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   Fever likely secondary to UTI: Had leukocytosis with history of hematuria.  On Rocephin.  Urine culture showing more than 100,000 colonies of group B strep.  Temperature max of 101 F today.  No leukocytosis noted.  Influenza COVID and RSV was negative.   Diarrhea: GI pathogen panel and C. difficile was negative.  Likely secondary to antibiotics.  Had 2-3 episodes yesterday.    Hypomagnesemia: Replenished.  Latest magnesium of 2.3.   Hypocalcemia: Replenished   Hypokalemia: Mild.  Will replenish orally.  Check levels in AM.  Potassium today at 3.4.   Normocytic anemia:  Hemoglobin today at 10.6.  No evidence of bleeding.   Mild AKI:  Likely due to poor intake and diarrhea and diuretic use. Improved with IV fluids.  Latest creatinine at 1.1.   Normal anion gap metabolic acidosis: Likely in the setting of diarrhea and dehydration Improved after IV fluids.  Gap has resolved at this time.   Hyponatremia: Latest sodium at 129 from 127.[name]   History of alcohol abuse: Was on CIWA protocol during hospitalization.  No signs of withdrawal.   Non-insulin-dependent type 2 diabetes: Hemoglobin A1c is pending.  Continue sliding scale insulin.  Latest POC glucose of 209.  Debility, deconditioning, bilateral  knee pain, ambulatory dysfunction. Patient has been seen by physical therapy recommended CIR but not might not be a good candidate.  TOC consultation for possible skilled nursing facility/home PT on discharge.  X-ray of the right knee with degenerative changes and mild effusion.  Uric acid level was 7.9.  Patient he states that he has been having bilateral knee pain from degenerative joint disease and uses IcyHot at home.  Will add Voltaren gel.  Will give 1 dose of p.o. prednisone 30 mg today.   DVT prophylaxis: SCDs Start: 08/16/22 0127   Code Status:     Code Status: Full Code  Disposition: Home with home health/skilled nursing facility likely in 1 to 2 days.  Status is: Inpatient  Remains inpatient appropriate because: Ambulatory  dysfunction, debility, deconditioning, pending 2D echocardiogram, new fever,.   Family Communication:  Was unable to reach patient's daughter Ms. Donald Calderon and son Donald Calderon Done on the phone listed.  Consultants:  Calderon  Procedures:  Calderon  Antimicrobials:  Rocephin IV 08/16/2022>  Anti-infectives (From admission, onward)    Start      Dose/Rate Route Frequency Ordered Stop   08/16/22 2100  cefTRIAXone (ROCEPHIN) 1 g in sodium chloride 0.9 % 100 mL IVPB        1 g 200 mL/hr over 30 Minutes Intravenous Every 24 hours 08/16/22 0131     08/15/22 2145  cefTRIAXone (ROCEPHIN) 1 g in sodium chloride 0.9 % 100 mL IVPB        1 g 200 mL/hr over 30 Minutes Intravenous  Once 08/15/22 2144 08/15/22 2230      Subjective:  Today, patient was seen and examined at bedside.  Still complains of knee pain.  Nursing staff reported that he did have a fever of 101 Fahrenheit this morning.  Has mild cough but no sore throat no urinary urgency frequency or dysuria today but initially had stress urinary symptoms.  Complains of difficulty ambulating due to bilateral knee pain.  Used to use IcyHot in the past.  Objective: Vitals:   08/18/22 0130 08/18/22 0601 08/18/22 0727 08/18/22 0748  BP:  (!) 155/88  135/70  Pulse:  89  83  Resp:  20  15  Temp: 99.7 F (37.6 C) (!) 101 F (38.3 C) 99.3 F (37.4 C) 98.6 F (37 C)  TempSrc: Oral Oral Oral Oral  SpO2:  100%  97%  Weight:      Height:        Intake/Output Summary (Last 24 hours) at 08/18/2022 1158 Last data filed at 08/18/2022 0950 Gross per 24 hour  Intake --  Output 600 ml  Net -600 ml    Filed Weights   08/15/22 1306 08/16/22 0423  Weight: 98.9 kg 90.7 kg    Physical Examination: Body mass index is 29.53 kg/m.   General:  Average built, not in obvious distress elderly male, not in obvious distress, HENT:   No scleral pallor or icterus noted. Oral mucosa is moist.  Chest:  Clear breath sounds.  No crackles or wheezes.  CVS: S1 &S2 heard. No murmur.  Regular rate and rhythm. Abdomen: Soft, nontender, nondistended.  Bowel sounds are heard.   Extremities: No cyanosis, clubbing or edema.  Peripheral pulses are palpable.  Bilateral knee with joint line tenderness but right knee with some effusion  psych: Alert, awake and oriented, normal mood CNS:  No cranial nerve  deficits.  Moves all extremities. Skin: Warm and dry.  No rashes noted.  Data Reviewed:   CBC: Recent Labs  Lab 08/15/22 1259 08/16/22 0342 08/17/22 0235 08/18/22 0237  WBC 12.4* 12.3* 13.4* 9.8  NEUTROABS 9.8*  --   --   --   HGB 10.5* 11.3* 10.6* 10.0*  HCT 31.5* 33.8* 32.4* 30.3*  MCV 89.0 86.4 87.6 87.1  PLT 232 243 251 258     Basic Metabolic Panel: Recent Labs  Lab 08/15/22 1259 08/16/22 0342 08/17/22 0235 08/18/22 0237  NA 135 129* 127* 129*  K 3.0* 3.7 3.3* 3.4*  CL 109 96* 98 101  CO2 16* 20* 19* 22  GLUCOSE 106* 136* 134* 161*  BUN '9 11 8 11  '$ CREATININE 0.97 1.29* 1.18 1.27*  CALCIUM 6.1* 8.8* 8.2* 8.1*  MG 1.2* 2.6* 2.2 2.3  Liver Function Tests: Recent Labs  Lab 08/16/22 0342  AST 11*  ALT 8  ALKPHOS 48  BILITOT 1.0  PROT 6.4*  ALBUMIN 2.5*      Radiology Studies: DG Knee 1-2 Views Right  Result Date: 08/17/2022 CLINICAL DATA:  Pain and swelling EXAM: RIGHT KNEE - 1-2 VIEW COMPARISON:  Calderon Available. FINDINGS: No recent fracture or dislocation is seen. Degenerative changes are noted with bony spurs in medial, lateral and patellofemoral compartments. There is moderate to large effusion in suprapatellar bursa. There is narrowing of joint space in the medial compartment. 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 Scattered vascular calcifications are seen in soft tissues. IMPRESSION: No recent fracture or dislocation is seen in right knee. Degenerative changes are noted. Moderate to large effusion is present in suprapatellar bursa. Electronically Signed   By: Elmer Picker M.D.   On: 08/17/2022 16:35      LOS: 2 days    Flora Lipps, MD [facility] Available via Epic secure chat 7am-7pm After these hours, please refer to coverage provider listed on [email or web address] 08/18/2022, 11:58 AM

## 2022-08-19 DIAGNOSIS — R051 Acute cough: Secondary | ICD-10-CM | POA: Diagnosis not present

## 2022-08-19 DIAGNOSIS — R197 Diarrhea, unspecified: Secondary | ICD-10-CM | POA: Diagnosis not present

## 2022-08-19 DIAGNOSIS — R079 Chest pain, unspecified: Secondary | ICD-10-CM | POA: Diagnosis not present

## 2022-08-19 DIAGNOSIS — N390 Urinary tract infection, site not specified: Secondary | ICD-10-CM | POA: Diagnosis not present

## 2022-08-19 LAB — BASIC METABOLIC PANEL
Anion gap: 4 — ABNORMAL LOW (ref 5–15)
BUN: 11 mg/dL (ref 8–23)
CO2: 21 mmol/L — ABNORMAL LOW (ref 22–32)
Calcium: 8.3 mg/dL — ABNORMAL LOW (ref 8.9–10.3)
Chloride: 106 mmol/L (ref 98–111)
Creatinine, Ser: 1.04 mg/dL (ref 0.61–1.24)
GFR, Estimated: >60 mL/min (ref >60)
Glucose, Bld: 189 mg/dL — ABNORMAL HIGH (ref 70–99)
Potassium: 3.3 mmol/L — ABNORMAL LOW (ref 3.5–5.1)
Sodium: 131 mmol/L — ABNORMAL LOW (ref 135–145)

## 2022-08-19 LAB — CBC
HCT: 33 % — ABNORMAL LOW (ref 39.0–52.0)
Hemoglobin: 10.8 g/dL — ABNORMAL LOW (ref 13.0–17.0)
MCH: 28.4 pg (ref 26.0–34.0)
MCHC: 32.7 g/dL (ref 30.0–36.0)
MCV: 86.8 fL (ref 80.0–100.0)
Platelets: 311 10*3/uL (ref 150–400)
RBC: 3.8 MIL/uL — ABNORMAL LOW (ref 4.22–5.81)
RDW: 13.7 % (ref 11.5–15.5)
WBC: 11.5 10*3/uL — ABNORMAL HIGH (ref 4.0–10.5)
nRBC: 0 % (ref 0.0–0.2)

## 2022-08-19 LAB — GLUCOSE, CAPILLARY
Glucose-Capillary: 154 mg/dL — ABNORMAL HIGH (ref 70–99)
Glucose-Capillary: 244 mg/dL — ABNORMAL HIGH (ref 70–99)
Glucose-Capillary: 273 mg/dL — ABNORMAL HIGH (ref 70–99)
Glucose-Capillary: 336 mg/dL — ABNORMAL HIGH (ref 70–99)

## 2022-08-19 LAB — MAGNESIUM: Magnesium: 2.1 mg/dL (ref 1.7–2.4)

## 2022-08-19 MED ORDER — AMLODIPINE BESYLATE 10 MG PO TABS
10.0000 mg | ORAL_TABLET | Freq: Every day | ORAL | Status: DC
  Administered 2022-08-19 – 2022-08-21 (×3): 10 mg via ORAL
  Filled 2022-08-19 (×3): qty 1

## 2022-08-19 MED ORDER — LOPERAMIDE HCL 2 MG PO CAPS
2.0000 mg | ORAL_CAPSULE | ORAL | Status: DC | PRN
  Administered 2022-08-19: 2 mg via ORAL
  Filled 2022-08-19: qty 1

## 2022-08-19 MED ORDER — FLUTICASONE PROPIONATE 50 MCG/ACT NA SUSP: 1.0000 | Freq: Every day | NASAL | Status: DC | PRN

## 2022-08-19 MED ORDER — FINASTERIDE 5 MG PO TABS
5.0000 mg | ORAL_TABLET | Freq: Every day | ORAL | Status: DC
  Administered 2022-08-19 – 2022-08-21 (×3): 5 mg via ORAL
  Filled 2022-08-19 (×3): qty 1

## 2022-08-19 MED ORDER — POTASSIUM CHLORIDE CRYS ER 20 MEQ PO TBCR
40.0000 meq | EXTENDED_RELEASE_TABLET | Freq: Every day | ORAL | Status: DC
  Administered 2022-08-20 – 2022-08-21 (×2): 40 meq via ORAL
  Filled 2022-08-19 (×2): qty 2

## 2022-08-19 MED ORDER — METOPROLOL SUCCINATE ER 25 MG PO TB24
25.0000 mg | ORAL_TABLET | Freq: Every day | ORAL | Status: DC
  Administered 2022-08-19 – 2022-08-21 (×3): 25 mg via ORAL
  Filled 2022-08-19 (×3): qty 1

## 2022-08-19 MED ORDER — POTASSIUM CHLORIDE CRYS ER 20 MEQ PO TBCR
40.0000 meq | EXTENDED_RELEASE_TABLET | Freq: Once | ORAL | Status: AC
  Administered 2022-08-19: 40 meq via ORAL
  Filled 2022-08-19: qty 2

## 2022-08-19 MED ORDER — DORZOLAMIDE HCL-TIMOLOL MAL 2-0.5 % OP SOLN
1.0000 [drp] | Freq: Two times a day (BID) | OPHTHALMIC | Status: DC
  Administered 2022-08-19 – 2022-08-21 (×5): 1 [drp] via OPHTHALMIC
  Filled 2022-08-19: qty 10

## 2022-08-19 MED ORDER — PRAVASTATIN SODIUM 10 MG PO TABS
10.0000 mg | ORAL_TABLET | Freq: Every evening | ORAL | Status: DC
  Administered 2022-08-19 – 2022-08-20 (×2): 10 mg via ORAL
  Filled 2022-08-19 (×2): qty 1

## 2022-08-19 MED ORDER — TAMSULOSIN HCL 0.4 MG PO CAPS
0.4000 mg | ORAL_CAPSULE | Freq: Every day | ORAL | Status: DC
  Administered 2022-08-19 – 2022-08-20 (×2): 0.4 mg via ORAL
  Filled 2022-08-19 (×2): qty 1

## 2022-08-19 MED ORDER — LOPERAMIDE HCL 2 MG PO CAPS
2.0000 mg | ORAL_CAPSULE | Freq: Once | ORAL | Status: AC
  Administered 2022-08-19: 2 mg via ORAL
  Filled 2022-08-19: qty 1

## 2022-08-19 NOTE — TOC Progression Note (Addendum)
Transition of Care [facility]) - Progression Note    Patient Details  Name: Donald Calderon MRN: [id number] Date of Birth: 03-03-1941  Transition of Care [facility]) CM/SW [place], [place] Phone Number: 08/19/2022, 10:37 AM  Clinical Narrative:     CSW spoke with patient at bedside to follow up with SNF placement. Patient informed CSW he has decided he wants to return home when medically ready for dc.CSW spoke with patients daughter [name] who politely informed CSW she will be unable to provide supervison for patient at home. Patients daughter is hopeful that patient will do better with PT today. All questions answered. No further questions reported at this time.CSW informed CM.        Expected Discharge Plan and Services                                               Social Determinants of Health (SDOH) Interventions SDOH Screenings   Food Insecurity: No Food Insecurity (08/16/2022)  Housing: Low Risk  (08/16/2022)  Transportation Needs: No Transportation Needs (08/16/2022)  Utilities: Not At Risk (08/16/2022)  Tobacco Use: Medium Risk (08/16/2022)    Readmission Risk Interventions     No data to display

## 2022-08-19 NOTE — Progress Notes (Signed)
Inpatient Diabetes Program Recommendations  AACE/ADA: New Consensus Statement on Inpatient Glycemic Control (2015)  Target Ranges:  Prepandial:   less than 140 mg/dL      Peak postprandial:   less than 180 mg/dL (1-2 hours)      Critically ill patients:  140 - 180 mg/dL   Lab Results  Component Value Date   GLUCAP 244 (H) 08/19/2022   HGBA1C 6.1 (H) 07/03/2021    Review of Glycemic Control  Latest Reference Range & Units 08/18/22 11:43 08/18/22 15:53 08/18/22 21:32 08/19/22 07:59 08/19/22 11:40  Glucose-Capillary 70 - 99 mg/dL 209 (H) 211 (H) 309 (H) 154 (H) 244 (H)   Diabetes history: DM  Outpatient Diabetes medications:  Jardiance 10 mg daily Metformin 2000 mg bid Current orders for Inpatient glycemic control:  Novolog 0-9 units tid with meals and HS Prednisone 30 mg daily  Inpatient Diabetes Program Recommendations:    May consider adding Novolog meal coverage 3 units tid with meals (hold if patient eats less than 50% or NPO) while on steroids.   Thanks,  Adah Perl, RN, BC-ADM Inpatient Diabetes Coordinator Pager [phone]  (8a-5p)

## 2022-08-19 NOTE — Care Management Important Message (Signed)
Important Message  Patient Details  Name: TAZ WAGGLE MRN: [id number] Date of Birth: 08-21-1940   Medicare Important Message Given:  Yes     Shelda Altes 08/19/2022, 8:37 AM

## 2022-08-19 NOTE — Progress Notes (Signed)
Physical Therapy Treatment Patient Details Name: Donald Calderon MRN: [id number] DOB: [birth year] Today's Date: 08/19/2022   History of Present Illness Pt is an 82 y.o. male who presented 08/15/22 with SOB and chest pain. Troponin negative x 2. 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 Pt with UTI. 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    PT Comments    Pt received in bathroom, requesting assist for further ambulation and NT not available at that time, pt agreeable to second session to continue progressing gait and transfer training. Pt continuing to require up to minA for transfers from elevated surfaces and gait but did not need chair follow for second gait trial. Dense cues for safety and sequencing throughout as pt with decreased carryover of safe UE/RLE placement from previous session. Pt up in recliner with alarm on and ice pack to his R knee at end of session, also reviewed HEP program with handout given, pt will need reinforcement from therapies or family to read it for him as he is legally blind and may not be able to see text on handout. Pt continues to benefit from PT services to progress toward functional mobility goals, continue to recommend SNF for pt safety as he is a high fall risk and remains below his functional baseline due to pain/fatigue.[name]    Recommendations for follow up therapy are one component of a multi-disciplinary discharge planning process, led by the attending physician.  Recommendations may be updated based on patient status, additional functional criteria and insurance authorization.  Follow Up Recommendations  Skilled nursing-short term rehab (<3 hours/day) Can patient  physically be transported by private vehicle: Yes (may have trouble with higher SUV type vehicle)   Assistance Recommended at Discharge Intermittent Supervision/Assistance  Patient can return home with the following Assistance with cooking/housework;Direct supervision/assist for medications management;Direct supervision/assist for financial management;Assist for transportation;Help with stairs or ramp for entrance;A lot of help with walking and/or transfers;A lot of help with bathing/dressing/bathroom   Equipment Recommendations  BSC/3in1 (pt RLE pain is limiting his standing tolerance, especially at night this would help him transfer safely in time to get to bathroom)    Recommendations for Other Services       Precautions / Restrictions Precautions Precautions: Fall;Other (comment) (h/o right knee pain) Precaution Comments: legally blind Restrictions Weight Bearing Restrictions: No     Mobility  Bed Mobility Overal bed mobility: Needs Assistance Bed Mobility: Supine to Sit     Supine to sit: Min guard, HOB elevated     General bed mobility comments: pt up in bathroom    Transfers Overall transfer level: Needs assistance Equipment used: Rollator (4 wheels) Transfers: Sit to/from Stand Sit to Stand: From elevated surface, Min assist           General transfer comment: Pt stood with wall raill and minA for safety/stability, then stand>sit to recliner chair with minA. Dense cues for safe UE placement and RLE placement for reduced pain each transfer.    Ambulation/Gait Ambulation/Gait assistance: Min guard, Min assist Gait Distance (Feet): 25 Feet Assistive device: Rollator (4 wheels) Gait Pattern/deviations: Step-to pattern, Decreased step length - right, Decreased step length - left, Decreased weight shift to right, Knee flexed in stance - right, Decreased dorsiflexion - right,  Decreased dorsiflexion - left, Wide base of support Gait velocity: grossly <0.2 m/s      General Gait Details: Pt heavily reliant on rollator and with fair device management. He needed intermittent minA in narrow spaces in the room due to pt blindness and environmental obstacles/thresholds. Pt quick to fatigue but agreeable to sit up in recliner chair at end of second session.   Stairs             Wheelchair Mobility    Modified Rankin (Stroke Patients Only)       Balance Overall balance assessment: Needs assistance Sitting-balance support: No upper extremity supported, Feet supported Sitting balance-Leahy Scale: Good     Standing balance support: Bilateral upper extremity supported, During functional activity, Reliant on assistive device for balance Standing balance-Leahy Scale: Poor Standing balance comment: pt heavily reliant on BUE support due to pain and BLE weakness                            Cognition Arousal/Alertness: Awake/alert Behavior During Therapy: WFL for tasks assessed/performed Overall Cognitive Status: Within Functional Limits for tasks assessed                                          Exercises      General Comments General comments (skin integrity, edema, etc.): VSS on RA      Pertinent Vitals/Pain Pain Assessment Pain Assessment: 0-10 Pain Score: 7  Pain Location: R knee with ROM and WB Pain Descriptors / Indicators: Discomfort, Grimacing, Aching, Guarding, Sore, Tender Pain Intervention(s): Monitored during session, Repositioned, Limited activity within patient's tolerance, Premedicated before session, Ice applied    Home Living   Prior Function    PT Goals (current goals can now be found in the care plan section) Acute Rehab PT Goals Patient Stated Goal: to reduce his R knee pain PT Goal Formulation: With patient Time For Goal Achievement: 08/31/22 Progress towards PT goals: Progressing toward goals    Frequency    Min 3X/week      PT Plan Current plan remains appropriate        AM-PAC PT "6 Clicks" Mobility   Outcome Measure  Help needed turning from your back to your side while in a flat bed without using bedrails?: A Little Help needed moving from lying on your back to sitting on the side of a flat bed without using bedrails?: A Little Help needed moving to and from a bed to a chair (including a wheelchair)?: A Little Help needed standing up from a chair using your arms (e.g., wheelchair or bedside chair)?: A Little Help needed to walk in hospital room?: A Lot Help needed climbing 3-5 steps with a railing? : Total 6 Click Score: 15    End of Session Equipment Utilized During Treatment: Gait belt Activity Tolerance: Patient limited by fatigue;Patient limited by pain Patient left: in chair;with call bell/phone within reach;with chair alarm set (ice to R knee) Nurse Communication: Mobility status;Patient requests pain meds;Other (comment) (he wants voltaren gel for his R knee again) PT Visit Diagnosis: Unsteadiness on feet (R26.81);Muscle weakness (generalized) (M62.81);Difficulty in walking, not elsewhere classified (R26.2);Pain Pain - Right/Left: Right Pain - part of body: Knee     Time: [id number] PT Time Calculation (min) (ACUTE ONLY): 17 min  Charges:  $Gait Training: 8-22 mins $Therapeutic Activity: 8-22 mins  Houston Siren., PTA Acute Rehabilitation Services Secure Chat Preferred 9a-5:30pm Office: [place] 08/19/2022, 2:13 PM

## 2022-08-19 NOTE — Progress Notes (Signed)
Physical Therapy Treatment Patient Details Name: Donald Calderon MRN: [id number] DOB: Dec 19, 1940 Today's Date: 08/19/2022   History of Present Illness Pt is an 82 y.o. male who presented 08/15/22 with SOB and chest pain. Troponin negative x 2. 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 Pt with UTI. 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    PT Comments    Pt received in supine, agreeable to therapy session and with good participation and improved tolerance for transfer and gait training in the room. Pt standing tolerance limited due to pain and urinary urgency, pt needing up to minA with increased time to perform transfers and gait, and pt given +2 for safety with chair follow due to c/o moderate to severe pain with gait trial. Pt making good progress toward goals this date and reports pain management MD has put in place has helped, but that he is "still concerned and wants his R knee checked out more thoroughly at home". Pt currently requiring physical assist (lifting assist) for all transfers and assist for safety with gait short distances in the room, therefore continue to recommend short term low intensity post-acute rehab. Per chart review, pt not eligible for CIR (and does not have consistent assist) therefore recommendation updated below. Pt states he plans to DC home, in which case he would need close to 24/7 supervision/assist for safety until pt strength and R knee pain back to his baseline. He is currently at a high risk of falls due to pain/deconditioning and he lives alone at baseline. Discussed this recommendation with Mr Hassell Done and supervising PT Anessa P and updated below.   Recommendations for  follow up therapy are one component of a multi-disciplinary discharge planning process, led by the attending physician.  Recommendations may be updated based on patient status, additional functional criteria and insurance authorization.  Follow Up Recommendations  Skilled nursing-short term rehab (<3 hours/day) Can patient physically be transported by private vehicle: Yes (may have trouble with higher SUV type vehicle)   Assistance Recommended at Discharge Intermittent Supervision/Assistance  Patient can return home with the following Assistance with cooking/housework;Direct supervision/assist for medications management;Direct supervision/assist for financial management;Assist for transportation;Help with stairs or ramp for entrance;A lot of help with walking and/or transfers;A lot of help with bathing/dressing/bathroom   Equipment Recommendations  BSC/3in1 (pt RLE pain is limiting his standing tolerance, especially at night this would help him transfer safely in time to get to bathroom)    Recommendations for Other Services       Precautions / Restrictions Precautions Precautions: Fall;Other (comment) (h/o right knee pain) Precaution Comments: legally blind Restrictions Weight Bearing Restrictions: No     Mobility  Bed Mobility Overal bed mobility: Needs Assistance Bed Mobility: Supine to Sit     Supine to sit: Min guard, HOB elevated     General bed mobility comments: pt has hospital bed at home, so [facility] elevated to simulate home set-up and    Transfers Overall transfer level: Needs assistance Equipment used: Rollator (4 wheels) Transfers: Sit to/from Stand Sit to Stand: From elevated surface, Min assist    General transfer comment: Pt coming to stand x2 reps from elevated bed surface (to simulate home set-up) then sat to elevated BSC over toilet. Instability upon initial stand  from EOB and pt had to sit abruptly, then on second attempt was able to maintain upright with  heavy use of rollator. Pt did need safety cues for improved technique with hand placement as he was pushing rollator too forcefully but pt defers to use 2 wheeled RW which would have been more stable.    Ambulation/Gait Ambulation/Gait assistance: Min guard, Min assist Gait Distance (Feet): 25 Feet (to doorway then into bathroom) Assistive device: Rollator (4 wheels) Gait Pattern/deviations: Step-to pattern, Decreased step length - right, Decreased step length - left, Decreased weight shift to right, Knee flexed in stance - right, Decreased dorsiflexion - right, Decreased dorsiflexion - left, Wide base of support Gait velocity: grossly <0.2 m/s     General Gait Details: Dense cues for safety/sequencing for decreased pain with gait pattern initially, pt heavily reliant on rollator and with fair device management. He needed intermittent minA in narrow spaces in the room due to pt blindness and environmental obstacles/thresholds.       Balance Overall balance assessment: Needs assistance Sitting-balance support: No upper extremity supported, Feet supported Sitting balance-Leahy Scale: Good     Standing balance support: Bilateral upper extremity supported, During functional activity, Reliant on assistive device for balance Standing balance-Leahy Scale: Poor Standing balance comment: pt heavily reliant on BUE support due to pain and BLE weakness                            Cognition Arousal/Alertness: Awake/alert Behavior During Therapy: WFL for tasks assessed/performed Overall Cognitive Status: Within Functional Limits for tasks assessed                                             General Comments General comments (skin integrity, edema, etc.): Pt instructed on ice frequency/use for pain reduction as well as importance of OOB to chair frequently throughout the day, pt agreeable to sit up in chair for a while this date. SpO2 noisy signal during ambulation due  to tight grip on AD, however with pt hand relaxed, SpO2 WFL on RA sitting/standing.      Pertinent Vitals/Pain Pain Assessment Pain Assessment: 0-10 Pain Score: 6  Pain Location: R knee with ROM and WB Pain Descriptors / Indicators: Discomfort, Grimacing, Aching, Guarding, Sore, Tender Pain Intervention(s): Monitored during session, Premedicated before session, RN gave pain meds during session, Repositioned, Ice applied     PT Goals (current goals can now be found in the care plan section) Acute Rehab PT Goals Patient Stated Goal: to reduce his R knee pain PT Goal Formulation: With patient Time For Goal Achievement: 08/31/22 Progress towards PT goals: Progressing toward goals    Frequency    Min 3X/week      PT Plan Discharge plan needs to be updated       AM-PAC PT "6 Clicks" Mobility   Outcome Measure  Help needed turning from your back to your side while in a flat bed without using bedrails?: A Little Help needed moving from lying on your back to sitting on the side of a flat bed without using bedrails?: A Little Help needed moving to and from a bed to a chair (including a wheelchair)?: A Little Help needed standing up from a chair using your arms (e.g., wheelchair or bedside chair)?: A Little Help needed to walk in hospital room?: A  Lot Help needed climbing 3-5 steps with a railing? : Total 6 Click Score: 15    End of Session Equipment Utilized During Treatment: Gait belt Activity Tolerance: Patient limited by fatigue;Patient limited by pain Patient left: in chair;with call bell/phone within reach;with chair alarm set (ice to R knee) Nurse Communication: Mobility status;Patient requests pain meds;Other (comment) (he wants voltaren gel for his R knee again) PT Visit Diagnosis: Unsteadiness on feet (R26.81);Muscle weakness (generalized) (M62.81);Difficulty in walking, not elsewhere classified (R26.2);Pain Pain - Right/Left: Right Pain - part of body: Knee      Time: 1231-1250 PT Time Calculation (min) (ACUTE ONLY): 19 min  Charges:  $Gait Training: 8-22 mins                     Austan Nicholl P., PTA Acute Rehabilitation Services Secure Chat Preferred 9a-5:30pm Office: [place] 08/19/2022, 1:55 PM

## 2022-08-19 NOTE — Progress Notes (Addendum)
PROGRESS NOTE    KINDELL KREITLER  [id number] DOB: Aug 21, 1940 DOA: 08/15/2022 PCP: Medicine, [facility]    Brief Narrative:  Donald Calderon is a 82 y.o. 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 Caffie Pinto as outpatient, type 2 diabetes with neuropathy, hypertension, hyperlipidemia, obesity, BPH, anxiety, depression, GERD presented to hospital with shortness of breath and chest pain and was brought in the hospital when his vitals were noted to be stable.  WBC was elevated at 12.4 with hypokalemia at 3.0.  Troponins were negative.  Magnesium was low at 1.2.  COVID/influenza/RSV PCR negative.  UA with signs of infection (large amount of leukocytes and microscopy showing >50 WBCs and many bacteria).  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  Trace left pleural effusion seen. Patient was given oral potassium 40 mEq, IV calcium gluconate 1 g, ceftriaxone, and IV magnesium 2 gm and [facility] was consulted for admission to the hospital.  Assessment & Plan:   Chest pain: EKG without acute findings.  Troponins were negative.  CTA of the chest was negative for pulmonary embolism.  Likely atypical chest pain.  2D echocardiogram with preserved LV function at 70 to 75%.  Ascending thoracic aortic aneurysm: -Noted on CT.  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   Fever likely secondary to UTI:   On Rocephin.  Urine culture showing more than 100,000 colonies of group B strep.  Temperature max of 98.7 F today.  Mild leukocytosis noted.  Influenza COVID and RSV was negative.  Will consider discontinuation of Jardiance.  Diarrhea: GI pathogen panel and C. difficile was negative.  Likely secondary to antibiotics.   Had 2-3 episodes yesterday.  Still complains of loose stools.  Will give 1 dose of loperamide.   Hypomagnesemia: Replenished.  Latest magnesium of 2.3.   Hypocalcemia: Replenished   Hypokalemia: Mild.  Will continue to replenish.  Check levels in AM.  Has been having some diarrhea.   Normocytic anemia:  Hemoglobin today at 10.8.  No evidence of bleeding.   Mild AKI:  Likely due to poor intake and diarrhea and diuretic use. Improved with IV fluids.  Latest creatinine at 1.0.   Normal anion gap metabolic acidosis: Likely in the setting of diarrhea and dehydration Improved after IV fluids.  Anion gap has resolved at this time.   Hyponatremia: Latest sodium at 131 from 129.   History of alcohol abuse: Was on CIWA protocol during hospitalization.  No signs of withdrawal.   Non-insulin-dependent type 2 diabetes: Hemoglobin A1c is pending.  Continue sliding scale insulin.  Latest POC glucose of 244.  Was on Jardiance and metformin at home.  Will discontinue Jardiance.  Debility, deconditioning, bilateral  knee pain, ambulatory dysfunction. X-ray of the right knee with degenerative changes and mild effusion.  Uric acid level was 7.9.  Patient he states that he has been having bilateral knee pain from degenerative joint disease and uses IcyHot at home.  He was supposed to go for knee replacement but has been putting it off.  On Voltaren gel with improvement.  Received 1 dose of prednisone yesterday. .Patient has been seen by physical therapy and recommended skilled nursing facility but patient is adamant about going home.  Patient lives by himself at home  and has blindness and family has not been able to give time for him.  Communicated with TOC.  At this time likely to be unsafe disposition for him.     DVT prophylaxis: SCDs Start: 08/16/22 0127   Code Status:     Code Status: Full Code  Disposition: Home with home health/skilled nursing facility likely in 1 to 2 days.  TOC involved.   Currently unsafe disposition.  Status is: Inpatient  Remains inpatient appropriate because: Ambulatory dysfunction, debility, deconditioning,    Family Communication:  Spoke with patient's daughter Ms. Sissy Hoff on the phone and updated her about the clinical condition of the patient.  Consultants:  None  Procedures:  None  Antimicrobials:  Rocephin IV 08/16/2022>  Anti-infectives (From admission, onward)    Start     Dose/Rate Route Frequency Ordered Stop   08/16/22 2100  cefTRIAXone (ROCEPHIN) 1 g in sodium chloride 0.9 % 100 mL IVPB        1 g 200 mL/hr over 30 Minutes Intravenous Every 24 hours 08/16/22 0131     08/15/22 2145  cefTRIAXone (ROCEPHIN) 1 g in sodium chloride 0.9 % 100 mL IVPB        1 g 200 mL/hr over 30 Minutes Intravenous  Once 08/15/22 2144 08/15/22 2230      Subjective:  Today, patient was seen and examined at bedside.  Feels a little better with his knees with less pain.  States that he wants to go home but PT recommends short-term skilled nursing facility.    Objective: Vitals:   08/18/22 0727 08/18/22 0748 08/18/22 1929 08/19/22 0443  BP:  135/70 (!) 129/92 (!) 141/77  Pulse:  83 83 64  Resp:  '15 18 15  '$ Temp: 99.3 F (37.4 C) 98.6 F (37 C) 98.7 F (37.1 C) 98.6 F (37 C)  TempSrc: Oral Oral Oral Oral  SpO2:  97% 94% 98%  Weight:      Height:        Intake/Output Summary (Last 24 hours) at 08/19/2022 1349 Last data filed at 08/19/2022 1132 Gross per 24 hour  Intake 2250.18 ml  Output 2100 ml  Net 150.18 ml    Filed Weights   08/15/22 1306 08/16/22 0423  Weight: 98.9 kg 90.7 kg    Physical Examination: Body mass index is 29.53 kg/m.   General:  Average built, not in obvious distress elderly male, legally blind. HENT:   No scleral pallor or icterus noted. Oral mucosa is moist.  Chest:  Clear breath sounds.  No crackles or wheezes.  CVS: S1 &S2 heard. No murmur.  Regular rate and rhythm. Abdomen: Soft, nontender, nondistended.   Bowel sounds are heard.   Extremities: No cyanosis, clubbing or edema.  Peripheral pulses are palpable.  Improved tenderness over the right knee without any erythema.  Psych: Alert, awake and oriented, normal mood CNS:  No cranial nerve deficits.  Moves all extremities. Skin: Warm and dry.  No rashes noted.  Data Reviewed:   CBC: Recent Labs  Lab 08/15/22 1259 08/16/22 0342 08/17/22 0235 08/18/22 0237 08/19/22 0847  WBC 12.4* 12.3* 13.4* 9.8 11.5*  NEUTROABS 9.8*  --   --   --   --   HGB 10.5* 11.3* 10.6* 10.0* 10.8*  HCT 31.5* 33.8* 32.4* 30.3* 33.0*  MCV 89.0 86.4 87.6 87.1 86.8  PLT 232 243 251 258 311     Basic Metabolic Panel: Recent Labs  Lab 08/15/22 1259 08/16/22 0342 08/17/22 0235 08/18/22 0237 08/19/22 [id number]  NA 135 129* 127* 129* 131*  K 3.0* 3.7 3.3* 3.4* 3.3*  CL 109 96* 98 101 106  CO2 16* 20* 19* 22 21*  GLUCOSE 106* 136* 134* 161* 189*  BUN '9 11 8 11 11  '$ CREATININE 0.97 1.29* 1.18 1.27* 1.04  CALCIUM 6.1* 8.8* 8.2* 8.1* 8.3*  MG 1.2* 2.6* 2.2 2.3 2.1     Liver Function Tests: Recent Labs  Lab 08/16/22 0342  AST 11*  ALT 8  ALKPHOS 48  BILITOT 1.0  PROT 6.4*  ALBUMIN 2.5*      Radiology Studies: ECHOCARDIOGRAM COMPLETE  Result Date: 08/18/2022    ECHOCARDIOGRAM REPORT   Patient Name:   PRAYAG STUECK Date of Exam: 08/18/2022 Medical Rec #:  [id number]     Height:       69.0 in Accession #:    [id number]    Weight:       200.0 lb Date of Birth:  08-Jan-1941    BSA:          2.066 m Patient Age:    58 years      BP:           135/70 mmHg Patient Gender: M             HR:           84 bpm. Exam Location:  Inpatient Procedure: 2D Echo, Cardiac Doppler and Color Doppler Indications:    R07.9* Chest pain, unspecified  History:        Patient has prior history of Echocardiogram examinations. Risk                 Factors:Diabetes, Hypertension and Dyslipidemia.  Sonographer:    Phineas Douglas Referring Phys: [id number] [place]  1. Left  ventricular ejection fraction, by estimation, is 70 to 75%. The left ventricle has hyperdynamic function. The left ventricle has no regional wall motion abnormalities. There is mild concentric left ventricular hypertrophy. Left ventricular diastolic parameters were normal.  2. Right ventricular systolic function is normal. The right ventricular size is normal. There is normal pulmonary artery systolic pressure. The estimated right ventricular systolic pressure is [id number] mmHg.  3. The mitral valve is grossly normal. Trivial mitral valve regurgitation. No evidence of mitral stenosis.  4. The aortic valve is tricuspid. There is mild calcification of the aortic valve. There is mild thickening of the aortic valve. Aortic valve regurgitation is mild. Aortic valve sclerosis/calcification is present, without any evidence of aortic stenosis.  5. Aortic dilatation noted. Aneurysm of the ascending aorta, measuring 47 mm. There is mild dilatation of the aortic root, measuring 41 mm.  6. The inferior vena cava is normal in size with greater than 50% respiratory variability, suggesting right atrial pressure of 3 mmHg. FINDINGS  Left Ventricle: Left ventricular ejection fraction, by estimation, is 70 to 75%. The left ventricle has hyperdynamic function. The left ventricle has no regional wall motion abnormalities. The left ventricular internal cavity size was normal in size. There is mild concentric left ventricular hypertrophy. Left ventricular diastolic parameters were normal. Right Ventricle: The right ventricular size is normal. No increase in right ventricular wall thickness. Right ventricular systolic function is normal. There is normal pulmonary artery systolic pressure. The tricuspid regurgitant velocity is 2.62 m/s, and  with an assumed right atrial pressure of 3 mmHg, the estimated right ventricular systolic pressure is [id number] mmHg. Left Atrium: Left atrial size was normal in size. Right Atrium: Right atrial size was  normal  in size. Pericardium: There is no evidence of pericardial effusion. Mitral Valve: The mitral valve is grossly normal. Trivial mitral valve regurgitation. No evidence of mitral valve stenosis. Tricuspid Valve: The tricuspid valve is grossly normal. Tricuspid valve regurgitation is mild . No evidence of tricuspid stenosis. Aortic Valve: The aortic valve is tricuspid. There is mild calcification of the aortic valve. There is mild thickening of the aortic valve. Aortic valve regurgitation is mild. Aortic valve sclerosis/calcification is present, without any evidence of aortic stenosis. Pulmonic Valve: The pulmonic valve was grossly normal. Pulmonic valve regurgitation is trivial. No evidence of pulmonic stenosis. Aorta: Aortic dilatation noted. There is mild dilatation of the aortic root, measuring 41 mm. There is an aneurysm involving the ascending aorta measuring 47 mm. Venous: The inferior vena cava is normal in size with greater than 50% respiratory variability, suggesting right atrial pressure of 3 mmHg. IAS/Shunts: The atrial septum is grossly normal.  LEFT VENTRICLE PLAX 2D LVIDd:         4.30 cm      Diastology LVIDs:         2.30 cm      LV e' medial:    8.05 cm/s LV PW:         1.10 cm      LV E/e' medial:  7.3 LV IVS:        1.20 cm      LV e' lateral:   12.00 cm/s LVOT diam:     2.10 cm      LV E/e' lateral: 4.9 LV SV:         103 LV SV Index:   50 LVOT Area:     3.46 cm  LV Volumes (MOD) LV vol d, MOD A2C: 105.0 ml LV vol d, MOD A4C: 111.0 ml LV vol s, MOD A2C: 26.4 ml LV vol s, MOD A4C: 35.3 ml LV SV MOD A2C:     78.6 ml LV SV MOD A4C:     111.0 ml LV SV MOD BP:      78.2 ml RIGHT VENTRICLE             IVC RV Basal diam:  4.00 cm     IVC diam: 1.20 cm RV S prime:     19.70 cm/s TAPSE (M-mode): 2.5 cm LEFT ATRIUM           Index        RIGHT ATRIUM           Index LA diam:      3.40 cm 1.65 cm/m   RA Area:     20.70 cm LA Vol (A2C): 46.0 ml 22.27 ml/m  RA Volume:   55.50 ml  26.87 ml/m LA Vol (A4C):  69.5 ml 33.64 ml/m  AORTIC VALVE              PULMONIC VALVE LVOT Vmax:   148.00 cm/s  PR End Diast Vel: 5.48 msec LVOT Vmean:  117.000 cm/s LVOT VTI:    0.296 m  AORTA Ao Root diam: 4.10 cm Ao Asc diam:  4.70 cm Ao Arch diam: 3.7 cm MITRAL VALVE               TRICUSPID VALVE MV Area (PHT): 2.74 cm    TR Peak grad:   27.5 mmHg MV Decel Time: 277 msec    TR Vmax:        262.00 cm/s MV E velocity: 58.70 cm/s MV A velocity: 64.00  cm/s  SHUNTS MV E/A ratio:  0.92        Systemic VTI:  0.30 m                            Systemic Diam: 2.10 cm Eleonore Chiquito MD Electronically signed by Eleonore Chiquito MD Signature Date/Time: 08/18/2022/12:22:52 PM    Final    DG Knee 1-2 Views Right  Result Date: 08/17/2022 CLINICAL DATA:  Pain and swelling EXAM: RIGHT KNEE - 1-2 VIEW COMPARISON:  None Available. FINDINGS: No recent fracture or dislocation is seen. Degenerative changes are noted with bony spurs in medial, lateral and patellofemoral compartments. There is moderate to large effusion in suprapatellar bursa. There is narrowing of joint space in the medial compartment. 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 Scattered vascular calcifications are seen in soft tissues. IMPRESSION: No recent fracture or dislocation is seen in right knee. Degenerative changes are noted. Moderate to large effusion is present in suprapatellar bursa. Electronically Signed   By: Elmer Picker M.D.   On: 08/17/2022 16:35      LOS: 3 days    Flora Lipps, MD [facility] Available via Epic secure chat 7am-7pm After these hours, please refer to coverage provider listed on [email or web address] 08/19/2022, 1:49 PM

## 2022-08-20 DIAGNOSIS — R079 Chest pain, unspecified: Secondary | ICD-10-CM | POA: Diagnosis not present

## 2022-08-20 DIAGNOSIS — N390 Urinary tract infection, site not specified: Secondary | ICD-10-CM | POA: Diagnosis not present

## 2022-08-20 DIAGNOSIS — R197 Diarrhea, unspecified: Secondary | ICD-10-CM | POA: Diagnosis not present

## 2022-08-20 DIAGNOSIS — R051 Acute cough: Secondary | ICD-10-CM | POA: Diagnosis not present

## 2022-08-20 LAB — BASIC METABOLIC PANEL
Anion gap: 7 (ref 5–15)
BUN: 14 mg/dL (ref 8–23)
CO2: 22 mmol/L (ref 22–32)
Calcium: 8.4 mg/dL — ABNORMAL LOW (ref 8.9–10.3)
Chloride: 105 mmol/L (ref 98–111)
Creatinine, Ser: 1.15 mg/dL (ref 0.61–1.24)
GFR, Estimated: >60 mL/min (ref >60)
Glucose, Bld: 243 mg/dL — ABNORMAL HIGH (ref 70–99)
Potassium: 3.8 mmol/L (ref 3.5–5.1)
Sodium: 134 mmol/L — ABNORMAL LOW (ref 135–145)

## 2022-08-20 LAB — CBC
HCT: 28.5 % — ABNORMAL LOW (ref 39.0–52.0)
Hemoglobin: 9.8 g/dL — ABNORMAL LOW (ref 13.0–17.0)
MCH: 29.4 pg (ref 26.0–34.0)
MCHC: 34.4 g/dL (ref 30.0–36.0)
MCV: 85.6 fL (ref 80.0–100.0)
Platelets: 295 10*3/uL (ref 150–400)
RBC: 3.33 MIL/uL — ABNORMAL LOW (ref 4.22–5.81)
RDW: 13.7 % (ref 11.5–15.5)
WBC: 10.7 10*3/uL — ABNORMAL HIGH (ref 4.0–10.5)
nRBC: 0 % (ref 0.0–0.2)

## 2022-08-20 LAB — GLUCOSE, CAPILLARY
Glucose-Capillary: 184 mg/dL — ABNORMAL HIGH (ref 70–99)
Glucose-Capillary: 261 mg/dL — ABNORMAL HIGH (ref 70–99)
Glucose-Capillary: 222 mg/dL — ABNORMAL HIGH (ref 70–99)
Glucose-Capillary: 193 mg/dL — ABNORMAL HIGH (ref 70–99)

## 2022-08-20 LAB — MAGNESIUM: Magnesium: 2.2 mg/dL (ref 1.7–2.4)

## 2022-08-20 NOTE — Progress Notes (Signed)
PROGRESS NOTE    Donald Calderon  [id number] DOB: 1940-11-09 DOA: 08/15/2022 PCP: Medicine, [facility]    Brief Narrative:  Donald Calderon is a 82 y.o. 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 Caffie Pinto as outpatient, type 2 diabetes with neuropathy, hypertension, hyperlipidemia, obesity, BPH, anxiety, depression, GERD presented to hospital with shortness of breath and chest pain and was brought in the hospital when his vitals were noted to be stable.  WBC was elevated at 12.4 with hypokalemia at 3.0.  Troponins were negative.  Magnesium was low at 1.2.  COVID/influenza/RSV PCR negative.  UA with signs of infection (large amount of leukocytes and microscopy showing >50 WBCs and many bacteria).  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  Trace left pleural effusion seen. Patient was given oral potassium 40 mEq, IV calcium gluconate 1 g, ceftriaxone, and IV magnesium 2 gm and [facility] was consulted for admission to the hospital.  Assessment & Plan:   Chest pain: EKG without acute findings.  Troponins were negative.  CTA of the chest was negative for pulmonary embolism.  Likely atypical chest pain.  2D echocardiogram with preserved LV function at 70 to 75%.  No mention of further chest pain.  Ascending thoracic aortic aneurysm: -Noted on CT.  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   Fever likely secondary to UTI:   On Rocephin.  Urine culture showing more than 100,000 colonies of group B strep.  Temperature max of 98.7 F today.  Mild leukocytosis noted but trending down..  Influenza COVID and RSV was negative.  Will consider discontinuation of Jardiance on discharge..  Temperature max of 98.3  F.  Diarrhea: GI pathogen panel and C. difficile was negative.  Likely secondary to antibiotics.  Diarrhea has improved.  Hypomagnesemia: Replenished.  Latest magnesium of 2.2.   Hypocalcemia: Replenished   Hypokalemia: Improved after replacement.   Normocytic anemia:  Hemoglobin today at 9.8..  No evidence of bleeding.   Mild AKI:  Likely due to poor intake and diarrhea and diuretic use. Improved with IV fluids.  Latest creatinine at 1.1.   Normal anion gap metabolic acidosis: Likely in the setting of diarrhea and dehydration Improved after IV fluids.  Anion gap has resolved    Hyponatremia: Latest sodium at 134 from 129.   History of alcohol abuse: Was on CIWA protocol during hospitalization.  No signs of withdrawal.   Non-insulin-dependent type 2 diabetes: Hemoglobin A1c is pending.  Continue sliding scale insulin.  Latest POC glucose of 244.  Was on Jardiance and metformin at home.  Will discontinue Jardiance UTI.[name]  Debility, deconditioning, bilateral  knee pain, ambulatory dysfunction. X-ray of the right knee with degenerative changes and mild effusion.  Uric acid level was 7.9.  Patient he states that he has been having bilateral knee pain from degenerative joint disease and uses IcyHot at home.  He was supposed to go for knee replacement but has been putting it off.  On Voltaren gel with improvement. .Patient has been seen by physical therapy and recommended skilled nursing facility but patient wishes to go home.  Has a little bit improvement in terms of mobility today.  Patient lives by himself at home and has blindness and family has not been able  to give time for him.  TOC on board for safe disposition..     DVT prophylaxis: SCDs Start: 08/16/22 0127   Code Status:     Code Status: Full Code  Disposition: Home with home health likely by 08/21/2022.  TOC involved.   Status is: Inpatient  Remains inpatient appropriate because: Ambulatory dysfunction, debility,  deconditioning,    Family Communication:  Spoke with patient's daughter Ms. Sissy Hoff on the phone and updated her about the clinical condition of the patient on 08/19/2022.  Consultants:  None  Procedures:  None  Antimicrobials:  Rocephin IV 08/16/2022>  Anti-infectives (From admission, onward)    Start     Dose/Rate Route Frequency Ordered Stop   08/16/22 2100  cefTRIAXone (ROCEPHIN) 1 g in sodium chloride 0.9 % 100 mL IVPB        1 g 200 mL/hr over 30 Minutes Intravenous Every 24 hours 08/16/22 0131     08/15/22 2145  cefTRIAXone (ROCEPHIN) 1 g in sodium chloride 0.9 % 100 mL IVPB        1 g 200 mL/hr over 30 Minutes Intravenous  Once 08/15/22 2144 08/15/22 2230      Subjective:  Today, patient was seen and examined at bedside.  He states that he wants to go home.  Was able to ambulate a little better.  Denies any pain, nausea, vomiting, fever, chills or rigor.    Objective: Vitals:   08/19/22 1600 08/19/22 2014 08/20/22 0316 08/20/22 0807  BP:  127/75 127/70 127/80  Pulse: 78 71  61  Resp:  '18 18 12  '$ Temp:  98.3 F (36.8 C) 98 F (36.7 C) 98.1 F (36.7 C)  TempSrc:  Oral Oral Oral  SpO2:   98% 95%  Weight:      Height:        Intake/Output Summary (Last 24 hours) at 08/20/2022 1444 Last data filed at 08/20/2022 0803 Gross per 24 hour  Intake 100 ml  Output 1551 ml  Net -1451 ml    Filed Weights   08/15/22 1306 08/16/22 0423  Weight: 98.9 kg 90.7 kg    Physical Examination: Body mass index is 29.53 kg/m.   General: Alert awake and Communicative, elderly male, not in obvious distress, HENT:   No scleral pallor or icterus noted. Oral mucosa is moist.  Blindness. Chest:  Clear breath sounds.  No crackles or wheezes.  CVS: S1 &S2 heard. No murmur.  Regular rate and rhythm. Abdomen: Soft, nontender, nondistended.  Bowel sounds are heard.   Extremities: No cyanosis, clubbing or edema.  Peripheral pulses are palpable.  Nontender but mild joint effusion of the  knee bilaterally.   Psych: Alert, awake and oriented, normal mood CNS:  No cranial nerve deficits.  Moves all extremities. Skin: Warm and dry.  No rashes noted.  Data Reviewed:   CBC: Recent Labs  Lab 08/15/22 1259 08/16/22 0342 08/17/22 0235 08/18/22 0237 08/19/22 0847 08/20/22 0225  WBC 12.4* 12.3* 13.4* 9.8 11.5* 10.7*  NEUTROABS 9.8*  --   --   --   --   --   HGB 10.5* 11.3* 10.6* 10.0* 10.8* 9.8*  HCT 31.5* 33.8* 32.4* 30.3* 33.0* 28.5*  MCV 89.0 86.4 87.6 87.1 86.8 85.6  PLT 232 243 251 258 311 295     Basic Metabolic Panel: Recent Labs  Lab 08/16/22 0342 08/17/22 0235 08/18/22 0237 08/19/22 0847 08/20/22 0225  NA 129* 127* 129* 131* 134*  K 3.7 3.3* 3.4* 3.3* 3.8  CL  96* 98 101 106 105  CO2 20* 19* 22 21* 22  GLUCOSE 136* 134* 161* 189* 243*  BUN '11 8 11 11 14  '$ CREATININE 1.29* 1.18 1.27* 1.04 1.15  CALCIUM 8.8* 8.2* 8.1* 8.3* 8.4*  MG 2.6* 2.2 2.3 2.1 2.2     Liver Function Tests: Recent Labs  Lab 08/16/22 0342  AST 11*  ALT 8  ALKPHOS 48  BILITOT 1.0  PROT 6.4*  ALBUMIN 2.5*      Radiology Studies: No results found.    LOS: 4 days    Flora Lipps, MD [facility] Available via Epic secure chat 7am-7pm After these hours, please refer to coverage provider listed on [email or web address] 08/20/2022, 2:44 PM

## 2022-08-20 NOTE — Progress Notes (Signed)
Physical Therapy Treatment Patient Details Name: Donald Calderon MRN: [id number] DOB: 19-Mar-1941 Today's Date: 08/20/2022   History of Present Illness Pt is an 82 y.o. male who presented 08/15/22 with SOB and chest pain. Troponin negative x 2. 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 Pt with UTI. 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    PT Comments    Pt tolerated treatment well today. Pt was able to progress ambulation in the hallway with rollator Min guard/Min A. Pt continues to be limited by R knee pain but overall is steady. DC recs updated to HHPT as pt is still refusing SNF and currently progressing well. Pt would benefit from continued gait and transfer training during acute stay.  Recommendations for follow up therapy are one component of a multi-disciplinary discharge planning process, led by the attending physician.  Recommendations may be updated based on patient status, additional functional criteria and insurance authorization.  Follow Up Recommendations  Home health PT Can patient physically be transported by private vehicle: Yes   Assistance Recommended at Discharge Intermittent Supervision/Assistance  Patient can return home with the following Assistance with cooking/housework;Direct supervision/assist for medications management;Direct supervision/assist for financial management;Assist for transportation;Help with stairs or ramp for entrance;A lot of help with walking and/or transfers;A lot of help with bathing/dressing/bathroom   Equipment Recommendations  BSC/3in1    Recommendations for Other Services       Precautions / Restrictions Precautions Precautions: Fall;Other  (comment) Precaution Comments: legally blind, hx of R Knee pain Restrictions Weight Bearing Restrictions: No     Mobility  Bed Mobility               General bed mobility comments: Pt up in chair upon arrival    Transfers Overall transfer level: Needs assistance Equipment used: Rollator (4 wheels) Transfers: Sit to/from Stand Sit to Stand: Min guard           General transfer comment: Pt cued for standing up tall and bringing hips forward and feet back.    Ambulation/Gait Ambulation/Gait assistance: Min guard, Min assist, +2 safety/equipment Gait Distance (Feet): 75 Feet Assistive device: Rollator (4 wheels) Gait Pattern/deviations: Step-to pattern, Decreased step length - right, Decreased step length - left, Decreased weight shift to right, Knee flexed in stance - right, Decreased dorsiflexion - right, Decreased dorsiflexion - left, Wide base of support, Knees buckling Gait velocity: grossly <0.2 m/s     General Gait Details: Pt had one episode of R knee buckling however no LOB noted. Pt reported several times that his knee felt spongy.   Stairs             Wheelchair Mobility    Modified Rankin (Stroke Patients Only)       Balance Overall balance assessment: Needs assistance Sitting-balance support: No upper extremity supported, Feet supported Sitting balance-Leahy Scale: Good     Standing balance support: Bilateral upper extremity supported, During functional activity, Reliant on assistive device for balance Standing balance-Leahy Scale: Poor Standing balance comment: pt heavily reliant on BUE support due to pain and BLE weakness                            Cognition Arousal/Alertness: Awake/alert Behavior During  Therapy: WFL for tasks assessed/performed Overall Cognitive Status: Within Functional Limits for tasks assessed                                          Exercises      General Comments General  comments (skin integrity, edema, etc.): VSS on RA      Pertinent Vitals/Pain Pain Assessment Pain Assessment: Faces Faces Pain Scale: Hurts little more Pain Location: R knee Pain Descriptors / Indicators: Guarding, Grimacing Pain Intervention(s): Monitored during session (Pt kept stating that his knee felt "spongy")    Home Living                          Prior Function            PT Goals (current goals can now be found in the care plan section) Progress towards PT goals: Progressing toward goals    Frequency    Min 3X/week      PT Plan Discharge plan needs to be updated    Co-evaluation              AM-PAC PT "6 Clicks" Mobility   Outcome Measure  Help needed turning from your back to your side while in a flat bed without using bedrails?: A Little Help needed moving from lying on your back to sitting on the side of a flat bed without using bedrails?: A Little Help needed moving to and from a bed to a chair (including a wheelchair)?: A Little Help needed standing up from a chair using your arms (e.g., wheelchair or bedside chair)?: A Little Help needed to walk in hospital room?: A Little Help needed climbing 3-5 steps with a railing? : Total 6 Click Score: 16    End of Session Equipment Utilized During Treatment: Gait belt Activity Tolerance: Patient tolerated treatment well;Patient limited by pain Patient left: in chair;with call bell/phone within reach;with chair alarm set Nurse Communication: Mobility status PT Visit Diagnosis: Unsteadiness on feet (R26.81);Muscle weakness (generalized) (M62.81);Difficulty in walking, not elsewhere classified (R26.2);Pain Pain - Right/Left: Right Pain - part of body: Knee     Time: [id number] PT Time Calculation (min) (ACUTE ONLY): 19 min  Charges:  $Gait Training: 8-22 mins                     Shelby Mattocks, PT, DPT Acute Rehab Services [id number]    Viann Shove 08/20/2022, 12:10 PM

## 2022-08-20 NOTE — TOC Progression Note (Signed)
Transition of Care [facility]) - Progression Note    Patient Details  Name: Donald Calderon MRN: [id number] Date of Birth: April 09, 1941  Transition of Care [facility]) CM/SW Contact  Graves-Bigelow, Ocie Cornfield, RN Phone Number: 08/20/2022, 2:14 PM  Clinical Narrative: Case Manager received a message that the patient is declining SNF at this time and wants to return home. Case Manager received verbal permission to call daughter Sissy Hoff @ [phone]. PT/OT has recommended [facility] PT/OT since patient is declining SNF at this time. Daughter states she works a full time job and she visit the patient on MWF,Sunday. Daughter is agreeable to [place]- no agency preference. Case Manager discussed [place] and daughter chose [facility]. Referral for PT/OT/Aide submitted to [facility] and start of care to begin within 24-48 hours post transition home. Patient has DME rolling walker and recommendations are for [facility]- daughter states the patient knows his house and is not sure if he needs the [facility]. Case Manager will follow for DME needs. Case Manager discussed personal care services with [name] and discussed the out of pocket fee- she will need to discuss with the patient to see if he wants this service. [name] is aware that family has to arrange the personal care services. Case Manager will continue to follow for transition of care needs at the patient progresses.    Expected Discharge Plan: [place] Barriers to Discharge: No Barriers Identified  Expected Discharge Plan and Services In-house Referral: Clinical Social Work Discharge Planning Services: CM Consult Post Acute Care Choice: [place] arrangements for the past 2 months: Single Family Home                   DME Agency: NA       HH Arranged: PT, OT, Nurse's Aide [place] Agency: [place] Date [facility] Agency Contacted: 08/20/22 Time Kevin: [id number] Representative spoke with at [place]: [place]  Determinants of Health ([place]) Interventions SDOH Screenings   Food Insecurity: No Food Insecurity (08/16/2022)  Housing: Low Risk  (08/16/2022)  Transportation Needs: No Transportation Needs (08/16/2022)  Utilities: Not At Risk (08/16/2022)  Tobacco Use: Medium Risk (08/16/2022)    Readmission Risk Interventions     No data to display

## 2022-08-20 NOTE — Progress Notes (Signed)
Occupational Therapy Treatment Patient Details Name: Donald Calderon MRN: [id number] DOB: [birth year] Today's Date: 08/20/2022   History of present illness Pt is an 82 y.o. male who presented 08/15/22 with SOB and chest pain. Troponin negative x 2. 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 Pt with UTI. 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   OT comments  Pt making excellent progress towards OT goals. Focus on in-room mobility with Rollator at min guard, sit to stand transfer body mechanics with good carryover, and collaboration on task modification for daily tasks at home. Pt reports access to IADL assist at DC, still declining SNF rehab though making good progress for anticipated DC home with [place] follow up. Provided additional information for [facility] and audiobooks as pt was unaware of these resources.   Recommendations for follow up therapy are one component of a multi-disciplinary discharge planning process, led by the attending physician.  Recommendations may be updated based on patient status, additional functional criteria and insurance authorization.    Follow Up Recommendations  Home health OT (declining SNF)    Assistance Recommended at Discharge Frequent or constant Supervision/Assistance  Patient can return home with the following  A little help with walking and/or transfers;A little help with bathing/dressing/bathroom;Assistance with cooking/housework;Assist for transportation   Equipment Recommendations  None recommended by OT    Recommendations for Other Services      Precautions / Restrictions Precautions Precautions: Fall;Other (comment) Precaution Comments:  legally blind, hx of R Knee pain Restrictions Weight Bearing Restrictions: No       Mobility Bed Mobility Overal bed mobility: Modified Independent Bed Mobility: Supine to Sit                Transfers Overall transfer level: Needs assistance Equipment used: Rollator (4 wheels) Transfers: Sit to/from Stand Sit to Stand: Min guard           General transfer comment: standing from bedside and recliiner. pt noted with B feet sliding. Cued to bring feet as far back under pt as possible and lean forward when standing to gain balance better     Balance Overall balance assessment: Needs assistance Sitting-balance support: No upper extremity supported, Feet supported Sitting balance-Leahy Scale: Good     Standing balance support: Bilateral upper extremity supported, During functional activity, Reliant on assistive device for balance Standing balance-Leahy Scale: Poor                             ADL either performed or assessed with clinical judgement   ADL Overall ADL's : Needs assistance/impaired                     Lower Body Dressing: Minimal assistance;Sitting/lateral leans;Sit to/from stand Lower Body Dressing Details (indicate cue type and reason): able to bend to reach B feet to pull up socks well. assume some assist for donning around waist in standing as balance deficits still being addressed             Functional mobility during ADLs: Min guard;Rollator (4 wheels) General ADL Comments: Focus on in-room mobility with Rollator, body mechanics for sit to stand transfers and task modification for ADLs at home (  sponge bathing vs not getting in shower if feeling weak). Also discussed resources for low vision (services for the blind) and activities such as audiobooks as pt reported he used to love to read and only knew of braille for books. Educated on ways to access audiobooks but encouraged pt to discuss with family    Extremity/Trunk  Assessment Upper Extremity Assessment Upper Extremity Assessment: Overall WFL for tasks assessed   Lower Extremity Assessment Lower Extremity Assessment: Defer to PT evaluation        Vision   Vision Assessment?: No apparent visual deficits   Perception     Praxis      Cognition Arousal/Alertness: Awake/alert Behavior During Therapy: WFL for tasks assessed/performed Overall Cognitive Status: Within Functional Limits for tasks assessed                                          Exercises      Shoulder Instructions       General Comments VSS on RA    Pertinent Vitals/ Pain       Pain Assessment Pain Assessment: Faces Faces Pain Scale: Hurts little more Pain Location: R knee Pain Descriptors / Indicators: Guarding, Grimacing Pain Intervention(s): Monitored during session, Other (comment) (arthritis gel applied)  Home Living                                          Prior Functioning/Environment              Frequency  Min 2X/week        Progress Toward Goals  OT Goals(current goals can now be found in the care plan section)  Progress towards OT goals: Progressing toward goals  Acute Rehab OT Goals Patient Stated Goal: go home, look into audiobooks OT Goal Formulation: With patient Time For Goal Achievement: 09/01/22 Potential to Achieve Goals: Fair ADL Goals Pt Will Perform Grooming: with set-up;sitting;standing Pt Will Perform Lower Body Dressing: with set-up;with adaptive equipment;sitting/lateral leans;sit to/from stand;with min assist Pt Will Transfer to Toilet: with modified independence;stand pivot transfer;ambulating;bedside commode;regular height toilet Pt Will Perform Toileting - Clothing Manipulation and hygiene: sitting/lateral leans;sit to/from stand;with modified independence Additional ADL Goal #1: Pt will be Mod I bed mobility and sitting up at EOB for 8 min or greater in preparation for increased  participation in ADL's  Plan Discharge plan needs to be updated    Co-evaluation                 AM-PAC OT "6 Clicks" Daily Activity     Outcome Measure   Help from another person eating meals?: A Little Help from another person taking care of personal grooming?: A Little Help from another person toileting, which includes using toliet, bedpan, or urinal?: A Little Help from another person bathing (including washing, rinsing, drying)?: A Little Help from another person to put on and taking off regular upper body clothing?: A Little Help from another person to put on and taking off regular lower body clothing?: A Little 6 Click Score: 18    End of Session Equipment Utilized During Treatment: Gait belt;Rollator (4 wheels)  OT Visit Diagnosis: Muscle weakness (generalized) (M62.81);Unsteadiness on feet (R26.81);Pain Pain - Right/Left: Right Pain - part of body: Knee   Activity Tolerance Patient tolerated treatment  well   Patient Left in chair;with call bell/phone within reach;with chair alarm set   Nurse Communication Mobility status        Time: [id number] OT Time Calculation (min): 34 min  Charges: OT General Charges $OT Visit: 1 Visit OT Treatments $Self Care/Home Management : 8-22 mins $Therapeutic Activity: 8-22 mins  Malachy Chamber, OTR/L Acute Rehab Services Office: [phone]   Layla Maw 08/20/2022, 9:18 AM

## 2022-08-21 LAB — GLUCOSE, CAPILLARY
Glucose-Capillary: 202 mg/dL — ABNORMAL HIGH (ref 70–99)
Glucose-Capillary: 243 mg/dL — ABNORMAL HIGH (ref 70–99)
Glucose-Capillary: 289 mg/dL — ABNORMAL HIGH (ref 70–99)

## 2022-08-21 MED ORDER — DICLOFENAC SODIUM 1 % EX GEL: 2.0000 g | Freq: Four times a day (QID) | CUTANEOUS | Status: DC

## 2022-08-21 MED ORDER — ACETAMINOPHEN 325 MG PO TABS: 650.0000 mg | ORAL_TABLET | Freq: Four times a day (QID) | ORAL | Status: AC | PRN

## 2022-08-21 MED ORDER — CEPHALEXIN 500 MG PO CAPS: 500.0000 mg | ORAL_CAPSULE | Freq: Two times a day (BID) | ORAL | 0 refills | Status: AC

## 2022-08-21 NOTE — Discharge Summary (Signed)
Physician Discharge Summary  Donald Calderon [id number] DOB: [birth year] DOA: 08/15/2022  PCP: [facility]  Admit date: 08/15/2022 Discharge date: 08/21/2022  Admitted From: Home  Discharge disposition: SNF   Recommendations for Outpatient Follow-Up:   Follow up with your primary care provider at the skilled nursing facility in 3 to 5 days. Check CBC, BMP, magnesium in the next visit Patient would benefit from a orthopedic referral as outpatient for history of severe osteoarthritis of the knee.  Discharge Diagnosis:   Principal Problem:   Chest pain Active Problems:   Type 2 diabetes mellitus (HCC)   Hypokalemia   UTI (urinary tract infection)   Shortness of breath   Cough   Diarrhea   Hypomagnesemia   Hypocalcemia   Normal anion gap metabolic acidosis   History of alcohol abuse   Discharge Condition: Improved.  Diet recommendation: Low sodium, heart healthy.  Carbohydrate-modified.   Wound care: None.  Code status: Full.   History of Present Illness:   Donald Calderon is a 82 y.o. 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 Caffie Pinto as outpatient, type 2 diabetes with neuropathy, hypertension, hyperlipidemia, obesity, BPH, anxiety, depression, GERD presented to hospital with shortness of breath and chest pain and was brought in the hospital when his vitals were noted to be stable.  WBC was elevated at 12.4 with hypokalemia at 3.0.  Troponins were negative.  Magnesium was low at 1.2.  COVID/influenza/RSV PCR negative.  UA with signs of infection (large amount of leukocytes and microscopy showing >50 WBCs and many bacteria).  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  Trace left pleural effusion seen. Patient  was given oral potassium 40 mEq, IV calcium gluconate 1 g, ceftriaxone, and IV magnesium 2 gm and patient was considered for admission to the hospital.   Hospital Course:   Following conditions were addressed during hospitalization as listed below,  Chest pain: EKG without acute findings.  Troponins were negative.  CTA of the chest was negative for pulmonary embolism.  Likely atypical chest pain.  2D echocardiogram with preserved LV function at 70 to 75%.  No mention of further chest pain.   Ascending thoracic aortic aneurysm: -Noted on CT.  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  Will need outpatient follow-up.   Fever likely secondary to UTI: Received IV Rocephin during hospitalization..  Urine culture showing more than 100,000 colonies of group B strep.  Temperature max of 98.7 F today.  Mild leukocytosis noted but trending down.  Influenza COVID and RSV was negative.  Plan is to discontinue Jardiance on discharge due to UTI.  Temperature max of 98.3 F.  Thinly Keflex for 2 more days to complete 7-day course.   Diarrhea: GI pathogen panel and C. difficile was negative.  Likely secondary to antibiotics.  Diarrhea has improved.   Hypomagnesemia: Replenished.  Latest magnesium of 2.2.   Hypocalcemia: Replenished   Hypokalemia: Improved after replacement.   Normocytic anemia:  Hemoglobin today at 9.8..  No evidence of bleeding.   Mild AKI:  Likely due to poor intake and diarrhea and diuretic use. Improved with IV fluids.  Latest creatinine at 1.1.   Normal anion gap metabolic acidosis: Likely in the setting of diarrhea and dehydration Improved  after IV fluids.  Anion gap has resolved    Hyponatremia: Latest sodium at 134 from 129.   History of alcohol abuse: Was on CIWA protocol during hospitalization.  No signs of withdrawal.   Non-insulin-dependent type 2 diabetes: Hemoglobin A1c is pending.  Latest POC glucose of 244.  Was on  Jardiance and metformin at home.  Will discontinue Jardiance on discharge.   Debility, deconditioning, bilateral  knee pain, ambulatory dysfunction. X-ray of the right knee with degenerative changes and mild effusion.  Uric acid level was 7.9.  Patient he states that he has been having bilateral knee pain from degenerative joint disease and uses Icy[employer] at home.  He was supposed to go for knee replacement but has been putting it off.  On Voltaren gel with improvement.  Will continue Voltaren gel on discharge.  Disposition.  At this time, patient is stable for disposition home with outpatient PCP and orthopedics follow-up.  Medical Consultants:   None.  Procedures:    None Subjective:   Today, patient was seen and examined at bedside.  Patient stated that he changed his mind and wishes to go to rehabilitation at this time due to being by himself at home without good support, blindness and difficulty ambulating.    Discharge Exam:   Vitals:   08/21/22 0401 08/21/22 0800  BP: 121/75 (!) 152/77  Pulse: 68 73  Resp: 15 15  Temp: 98.3 F (36.8 C) 98 F (36.7 C)  SpO2: 96% 97%   Vitals:   08/20/22 1601 08/20/22 2001 08/21/22 0401 08/21/22 0800  BP: 136/79 137/73 121/75 (!) 152/77  Pulse: 68 70 68 73  Resp: '18 14 15 15  '$ Temp: 98.3 F (36.8 C) 98.3 F (36.8 C) 98.3 F (36.8 C) 98 F (36.7 C)  TempSrc: Oral Oral Oral Oral  SpO2: 96%  96% 97%  Weight:      Height:       Body mass index is 29.53 kg/m.  General: Alert awake and Communicative, not in obvious distress, elderly male, HENT:   No scleral pallor or icterus noted. Oral mucosa is moist.  Legally blindness. Chest:  Clear breath sounds.  No crackles or wheezes.  CVS: S1 &S2 heard. No murmur.  Regular rate and rhythm. Abdomen: Soft, nontender, nondistended.  Bowel sounds are heard.   Extremities: No cyanosis, clubbing or edema.  Peripheral pulses are palpable.  Mild bilateral knee joint effusion.   Psych: Alert, awake  and oriented, normal mood CNS:  No cranial nerve deficits.  Moves all the extremities. Skin: Warm and dry.  No rashes noted.  The results of significant diagnostics from this hospitalization (including imaging, microbiology, ancillary and laboratory) are listed below for reference.     Diagnostic Studies:   CT Angio Chest PE W and/or Wo Contrast  Result Date: 08/15/2022 CLINICAL DATA:  Chest pain, shortness of breath EXAM: CT ANGIOGRAPHY CHEST WITH CONTRAST TECHNIQUE: Multidetector CT imaging of the chest was performed using the standard protocol during bolus administration of intravenous contrast. Multiplanar CT image reconstructions and MIPs were obtained to evaluate the vascular anatomy. RADIATION DOSE REDUCTION: This exam was performed according to the departmental dose-optimization program which includes automated exposure control, adjustment of the mA and/or kV according to patient size and/or use of iterative reconstruction technique. CONTRAST:  93m OMNIPAQUE IOHEXOL 350 MG/ML SOLN COMPARISON:  07/08/2022 FINDINGS: Cardiovascular: Satisfactory opacification the bilateral pulmonary arteries to the lobar level. No evidence of pulmonary embolism. Stable 4.5 cm ascending thoracic aortic aneurysm. Stable  chronic focal dissection involving the mid descending thoracic aorta (series 5/image 58). Atherosclerotic calcifications of the aortic arch. Heart is normal in size. Small inferior pericardial effusion, unchanged. Moderate three-vessel coronary atherosclerosis. Mediastinum/Nodes: No suspicious mediastinal lymphadenopathy. Visualized thyroid is unremarkable. Lungs/Pleura: Mild dependent atelectasis in the bilateral lower lobes. Trace left pleural effusion. No suspicious pulmonary nodules. No pneumothorax. Upper Abdomen: Visualized upper abdomen is grossly unremarkable. Musculoskeletal: Degenerative changes of the visualized thoracolumbar spine. Review of the MIP images confirms the above findings.  IMPRESSION: No evidence of pulmonary embolism. Stable 4.5 cm ascending thoracic aortic aneurysm. 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 Trace left pleural effusion. Aortic Atherosclerosis (ICD10-I70.0). Electronically Signed   By: Julian Hy M.D.   On: 08/15/2022 20:22   DG Chest 2 View  Result Date: 08/15/2022 CLINICAL DATA:  chest pain EXAM: CHEST - 2 VIEW COMPARISON:  July 03, 2021, July 08, 2022 FINDINGS: The cardiomediastinal silhouette is unchanged in contour.Tortuous thoracic aorta. Atherosclerotic calcifications. No significant pleural effusion. Similar LEFT-sided pleural blunting the costophrenic border likely due to superimposed fat. No pneumothorax. Similar LEFT basilar platelike opacity most consistent with atelectasis. IMPRESSION: No acute cardiopulmonary abnormality. Electronically Signed   By: Valentino Saxon M.D.   On: 08/15/2022 14:52     Labs:   Basic Metabolic Panel: Recent Labs  Lab 08/16/22 0342 08/17/22 0235 08/18/22 0237 08/19/22 0847 08/20/22 0225  NA 129* 127* 129* 131* 134*  K 3.7 3.3* 3.4* 3.3* 3.8  CL 96* 98 101 106 105  CO2 20* 19* 22 21* 22  GLUCOSE 136* 134* 161* 189* 243*  BUN '11 8 11 11 14  '$ CREATININE 1.29* 1.18 1.27* 1.04 1.15  CALCIUM 8.8* 8.2* 8.1* 8.3* 8.4*  MG 2.6* 2.2 2.3 2.1 2.2   GFR Estimated Creatinine Clearance: 56.1 mL/min (by C-G formula based on SCr of 1.15 mg/dL). Liver Function Tests: Recent Labs  Lab 08/16/22 0342  AST 11*  ALT 8  ALKPHOS 48  BILITOT 1.0  PROT 6.4*  ALBUMIN 2.5*   No results for input(s): "LIPASE", "AMYLASE" in the last 168 hours. No results for input(s): "AMMONIA" in the last 168 hours. Coagulation profile No results for input(s): "INR", "PROTIME" in the last 168 hours.  CBC: Recent Labs  Lab 08/15/22 1259 08/16/22 0342 08/17/22 0235 08/18/22 0237 08/19/22 0847 08/20/22 0225  WBC 12.4* 12.3* 13.4* 9.8 11.5* 10.7*  NEUTROABS 9.8*  --   --   --    --   --   HGB 10.5* 11.3* 10.6* 10.0* 10.8* 9.8*  HCT 31.5* 33.8* 32.4* 30.3* 33.0* 28.5*  MCV 89.0 86.4 87.6 87.1 86.8 85.6  PLT 232 243 251 258 311 295   Cardiac Enzymes: No results for input(s): "CKTOTAL", "CKMB", "CKMBINDEX", "TROPONINI" in the last 168 hours. BNP: Invalid input(s): "POCBNP" CBG: Recent Labs  Lab 08/20/22 1148 08/20/22 1600 08/20/22 2115 08/21/22 0737 08/21/22 1218  GLUCAP 261* 222* 193* 202* 243*   D-Dimer No results for input(s): "DDIMER" in the last 72 hours. Hgb A1c No results for input(s): "HGBA1C" in the last 72 hours. Lipid Profile No results for input(s): "CHOL", "HDL", "LDLCALC", "TRIG", "CHOLHDL", "LDLDIRECT" in the last 72 hours. Thyroid function studies No results for input(s): "TSH", "T4TOTAL", "T3FREE", "THYROIDAB" in the last 72 hours.  Invalid input(s): "FREET3" Anemia work up No results for input(s): "VITAMINB12", "FOLATE", "FERRITIN", "TIBC", "IRON", "RETICCTPCT" in the last 72 hours. Microbiology Recent Results (from the past 240 hour(s))  Resp panel by RT-PCR (RSV, Flu A&B, Covid)  Anterior Nasal Swab     Status: None   Collection Time: 08/15/22  4:32 PM   Specimen: Anterior Nasal Swab  Result Value Ref Range Status   SARS Coronavirus 2 by RT PCR NEGATIVE NEGATIVE Final   Influenza A by PCR NEGATIVE NEGATIVE Final   Influenza B by PCR NEGATIVE NEGATIVE Final    Comment: (NOTE) The Xpert Xpress SARS-CoV-2/FLU/RSV plus assay is intended as an aid in the diagnosis of influenza from Nasopharyngeal swab specimens and should not be used as a sole basis for treatment. Nasal washings and aspirates are unacceptable for Xpert Xpress SARS-CoV-2/FLU/RSV testing.  Fact Sheet for Patients: EntrepreneurPulse.com.au  Fact Sheet for Healthcare Providers: IncredibleEmployment.be  This test is not yet approved or cleared by the [place] FDA and has been authorized for detection and/or diagnosis of  SARS-CoV-2 by FDA under an Emergency Use Authorization (EUA). This EUA will remain in effect (meaning this test can be used) for the duration of the COVID-19 declaration under Section 564(b)(1) of the Act, 21 U.S.C. section 360bbb-3(b)(1), unless the authorization is terminated or revoked.     Resp Syncytial Virus by PCR NEGATIVE NEGATIVE Final    Comment: (NOTE) Fact Sheet for Patients: EntrepreneurPulse.com.au  Fact Sheet for Healthcare Providers: IncredibleEmployment.be  This test is not yet approved or cleared by the [place] FDA and has been authorized for detection and/or diagnosis of SARS-CoV-2 by FDA under an Emergency Use Authorization (EUA). This EUA will remain in effect (meaning this test can be used) for the duration of the COVID-19 declaration under Section 564(b)(1) of the Act, 21 U.S.C. section 360bbb-3(b)(1), unless the authorization is terminated or revoked.  Performed at [facility], [place] [address], [place], [place] [address]   C Difficile Quick Screen w PCR reflex     Status: None   Collection Time: 08/16/22  5:07 AM   Specimen: STOOL  Result Value Ref Range Status   C Diff antigen NEGATIVE NEGATIVE Final   C Diff toxin NEGATIVE NEGATIVE Final   C Diff interpretation No C. difficile detected.  Final    Comment: Performed at [facility], [place] [address], [place], [place] [address]  Gastrointestinal Panel by PCR , Stool     Status: None   Collection Time: 08/16/22  5:07 AM   Specimen: STOOL  Result Value Ref Range Status   Campylobacter species NOT DETECTED NOT DETECTED Final   Plesimonas shigelloides NOT DETECTED NOT DETECTED Final   Salmonella species NOT DETECTED NOT DETECTED Final   Yersinia enterocolitica NOT DETECTED NOT DETECTED Final   Vibrio species NOT DETECTED NOT DETECTED Final   Vibrio cholerae NOT DETECTED NOT DETECTED Final   Enteroaggregative E coli (EAEC) NOT DETECTED NOT DETECTED  Final   Enteropathogenic E coli (EPEC) NOT DETECTED NOT DETECTED Final   Enterotoxigenic E coli (ETEC) NOT DETECTED NOT DETECTED Final   Shiga like toxin producing E coli (STEC) NOT DETECTED NOT DETECTED Final   Shigella/Enteroinvasive E coli (EIEC) NOT DETECTED NOT DETECTED Final   Cryptosporidium NOT DETECTED NOT DETECTED Final   Cyclospora cayetanensis NOT DETECTED NOT DETECTED Final   Entamoeba histolytica NOT DETECTED NOT DETECTED Final   Giardia lamblia NOT DETECTED NOT DETECTED Final   Adenovirus F40/41 NOT DETECTED NOT DETECTED Final   Astrovirus NOT DETECTED NOT DETECTED Final   Norovirus GI/GII NOT DETECTED NOT DETECTED Final   Rotavirus A NOT DETECTED NOT DETECTED Final   Sapovirus (I, II, IV, and V) NOT DETECTED NOT  DETECTED Final    Comment: Performed at [facility], [address] [place], [place] [address]  Urine Culture (for pregnant, neutropenic or urologic patients or patients with an indwelling urinary catheter)     Status: Abnormal   Collection Time: 08/16/22  5:09 AM   Specimen: Urine, Clean Catch  Result Value Ref Range Status   Specimen Description URINE, CLEAN CATCH  Final   Special Requests NONE  Final   Culture (A)  Final    >=100,000 COLONIES/mL GROUP B STREP(S.AGALACTIAE)ISOLATED TESTING AGAINST S. AGALACTIAE NOT ROUTINELY PERFORMED DUE TO PREDICTABILITY OF AMP/PEN/VAN SUSCEPTIBILITY. Performed at [facility], [place] [address], [place], [place] [address]    Report Status 08/17/2022 FINAL  Final     Discharge Instructions:   Discharge Instructions     Diet - low sodium heart healthy   Complete by: As directed    Diet Carb Modified   Complete by: As directed    Discharge instructions   Complete by: As directed    Follow-up with your primary care provider at the skilled nursing facility in 3 to 5 days.  Check blood work at that time.  Continue physical therapy.  Please discuss about orthopedic referral for your knee problem with your  primary care provider.   Increase activity slowly   Complete by: As directed       Allergies as of 08/21/2022       Reactions   Shellfish Allergy Anaphylaxis   Throat and tongue swells   Penicillins Itching, Rash   ** Tolerates cephalosporins Has patient had a PCN reaction causing immediate rash, facial/tongue/throat swelling, SOB or lightheadedness with hypotension: No Has patient had a PCN reaction causing severe rash involving mucus membranes or skin necrosis: No Has patient had a PCN reaction that required hospitalization: No Has patient had a PCN reaction occurring within the last 10 years: No If all of the above answers are "NO", then may proceed with Cephalosporin use. Tolerated Ancef        Medication List     STOP taking these medications    empagliflozin 10 MG Tabs tablet Commonly known as: Jardiance       TAKE these medications    acetaminophen 325 MG tablet Commonly known as: TYLENOL Take 2 tablets (650 mg total) by mouth every 6 (six) hours as needed for mild pain or moderate pain (or Fever >/= 101).   amLODipine 10 MG tablet Commonly known as: NORVASC Take 10 mg by mouth daily.   cephALEXin 500 MG capsule Commonly known as: KEFLEX Take 1 capsule (500 mg total) by mouth 2 (two) times daily for 2 days.   diclofenac Sodium 1 % Gel Commonly known as: VOLTAREN Apply 2 g topically 4 (four) times daily.   dorzolamide-timolol 2-0.5 % ophthalmic solution Commonly known as: COSOPT Place 1 drop into both eyes 2 (two) times daily.   finasteride 5 MG tablet Commonly known as: PROSCAR Take 5 mg by mouth daily.   fluticasone 50 MCG/ACT nasal spray Commonly known as: FLONASE Place 1 spray into both nostrils daily as needed for rhinitis.   furosemide 40 MG tablet Commonly known as: LASIX TAKE 1 TABLET BY MOUTH DAILY What changed: additional instructions   gabapentin 300 MG capsule Commonly known as: NEURONTIN Take 2 capsules (600 mg total) by mouth at  bedtime. What changed:  how much to take when to take this additional instructions   levocetirizine 5 MG tablet Commonly known as: XYZAL Take 5 mg  by mouth daily as needed for allergies.   losartan 25 MG tablet Commonly known as: COZAAR TAKE 1/2 TABLET(12.5 MG) BY MOUTH DAILY What changed: See the new instructions.   metFORMIN 1000 MG tablet Commonly known as: GLUCOPHAGE Take 2,000 mg by mouth 2 (two) times daily with a meal.   metoprolol succinate 25 MG 24 hr tablet Commonly known as: TOPROL-XL Take 1 tablet (25 mg total) by mouth daily.   polyethylene glycol 17 g packet Commonly known as: MIRALAX / GLYCOLAX Take 17 g by mouth daily as needed for mild constipation.   potassium chloride 10 MEQ tablet Commonly known as: KLOR-CON Take 10 mEq by mouth daily.   pravastatin 10 MG tablet Commonly known as: PRAVACHOL Take 1 tablet (10 mg total) by mouth every evening.   tamsulosin 0.4 MG Caps capsule Commonly known as: FLOMAX Take 1 capsule (0.4 mg total) by mouth daily after supper.        Contact information for follow-up providers     [facility], [facility] Follow up.   Specialty: Home Health Services Why: Physical and Occupational Therapy-Aide-office to call with visit times. Contact information: [place] [address] [phone]              Contact information for after-discharge care     Destination     [facility] Preferred SNF .   Service: Skilled Nursing Contact information: [id number] [place] [phone]                      Time coordinating discharge: 39 minutes  Signed:  Marijose Curington  [facility] 08/21/2022, 2:07 PM

## 2022-08-21 NOTE — Progress Notes (Signed)
Occupational Therapy Treatment Patient Details Name: Donald Calderon MRN: [id number] DOB: Apr 22, 1941 Today's Date: 08/21/2022   History of present illness Pt is an 82 y.o. male who presented 08/15/22 with SOB and chest pain. Troponin negative x 2. 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 Pt with UTI. 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   OT comments  Pt limited by increased R knee pain and fear of falling after reported "almost fall" yesterday getting back to bed from recliner. Pt requiring increased assist to stand with Rollator today, fearful of mobilizing and requiring increased assist for LB ADLs. Pt reports he is now agreeable for SNF rehab d/t yesterday's reported events. Updated DC recs to reflect.   Recommendations for follow up therapy are one component of a multi-disciplinary discharge planning process, led by the attending physician.  Recommendations may be updated based on patient status, additional functional criteria and insurance authorization.    Follow Up Recommendations  Skilled nursing-short term rehab (<3 hours/day) (as of 3/8, pt now reportedly agreeable with SNF rehab; if refuses SNF, would recommend [place])     Assistance Recommended at Discharge Frequent or constant Supervision/Assistance  Patient can return home with the following  A little help with walking and/or transfers;A little help with bathing/dressing/bathroom;Assistance with cooking/housework;Assist for transportation   Equipment Recommendations  None recommended by OT    Recommendations for Other Services      Precautions / Restrictions Precautions Precautions: Fall;Other (comment) Precaution Comments:  legally blind, hx of R Knee pain Restrictions Weight Bearing Restrictions: No       Mobility Bed Mobility Overal bed mobility: Modified Independent Bed Mobility: Supine to Sit, Sit to Supine                Transfers Overall transfer level: Needs assistance Equipment used: Rollator (4 wheels) Transfers: Sit to/from Stand Sit to Stand: Min assist, Mod assist           General transfer comment: First stand with Rollator at Mod A progressing to Min A for side steps at bedside. pt reporting fearful of walking over to chair due to reported "almost fall" yesterday getting back to bed from chair,. Cues for bringing feet back and leaning forward to stand tucking bottom in     Balance Overall balance assessment: Needs assistance Sitting-balance support: No upper extremity supported, Feet supported Sitting balance-Leahy Scale: Good     Standing balance support: Bilateral upper extremity supported, During functional activity, Reliant on assistive device for balance Standing balance-Leahy Scale: Poor                             ADL either performed or assessed with clinical judgement   ADL Overall ADL's : Needs assistance/impaired     Grooming: Set up;Sitting;Wash/dry face;Wash/dry hands   Upper Body Bathing: Minimal assistance;Sitting Upper Body Bathing Details (indicate cue type and reason): assist to bathe back Lower Body Bathing: Maximal assistance;Sit to/from stand Lower Body Bathing Details (indicate cue type and reason): able to bathe lower legs, assist for peri care in standing due to R knee pain and fearful of falling Upper Body Dressing : Minimal assistance;Sitting  General ADL Comments: Focus on EOB bathing, pt reporting fearful of falling and increased R knee pain    Extremity/Trunk Assessment Upper Extremity Assessment Upper Extremity Assessment: Overall WFL for tasks assessed   Lower Extremity Assessment Lower  Extremity Assessment: Defer to PT evaluation        Vision   Vision Assessment?: No apparent visual deficits   Perception     Praxis      Cognition Arousal/Alertness: Awake/alert Behavior During Therapy: WFL for tasks assessed/performed Overall Cognitive Status: Within Functional Limits for tasks assessed                                          Exercises      Shoulder Instructions       General Comments VSS on RA    Pertinent Vitals/ Pain       Pain Assessment Pain Assessment: No/denies pain Faces Pain Scale: Hurts little more Pain Location: R knee Pain Descriptors / Indicators: Guarding, Grimacing Pain Intervention(s): Monitored during session, Other (comment) (voltaren gel one hour prior)  Home Living                                          Prior Functioning/Environment              Frequency  Min 2X/week        Progress Toward Goals  OT Goals(current goals can now be found in the care plan section)  Progress towards OT goals: OT to reassess next treatment  Acute Rehab OT Goals Patient Stated Goal: go to rehab OT Goal Formulation: With patient Time For Goal Achievement: 09/01/22 Potential to Achieve Goals: Fair ADL Goals Pt Will Perform Grooming: with set-up;sitting;standing Pt Will Perform Lower Body Dressing: with set-up;with adaptive equipment;sitting/lateral leans;sit to/from stand;with min assist Pt Will Transfer to Toilet: with modified independence;stand pivot transfer;ambulating;bedside commode;regular height toilet Pt Will Perform Toileting - Clothing Manipulation and hygiene: sitting/lateral leans;sit to/from stand;with modified independence Additional ADL Goal #1: Pt will be Mod I bed mobility and sitting up at EOB for 8 min or greater in preparation for increased participation in ADL's  Plan Discharge plan needs to be updated    Co-evaluation                 AM-PAC OT "6 Clicks" Daily  Activity     Outcome Measure   Help from another person eating meals?: A Little Help from another person taking care of personal grooming?: A Little Help from another person toileting, which includes using toliet, bedpan, or urinal?: A Little Help from another person bathing (including washing, rinsing, drying)?: A Lot Help from another person to put on and taking off regular upper body clothing?: A Little Help from another person to put on and taking off regular lower body clothing?: A Little 6 Click Score: 17    End of Session Equipment Utilized During Treatment: Rollator (4 wheels)  OT Visit Diagnosis: Muscle weakness (generalized) (M62.81);Unsteadiness on feet (R26.81);Pain Pain - Right/Left: Right Pain - part of body: Knee   Activity Tolerance Patient limited by pain   Patient Left in bed;with call bell/phone within reach;with bed alarm set;with nursing/sitter in room   Nurse Communication Mobility status        Time: [id number] OT Time Calculation (  min): 27 min  Charges: OT General Charges $OT Visit: 1 Visit OT Treatments $Self Care/Home Management : 23-37 mins  Malachy Chamber, OTR/L Acute Rehab Services Office: [phone]   Layla Maw 08/21/2022, 11:46 AM

## 2022-08-21 NOTE — Progress Notes (Signed)
PROGRESS NOTE    Donald Calderon  [id number] DOB: 1940/10/31 DOA: 08/15/2022 PCP: [facility]    Brief Narrative:  Donald Calderon is a 82 y.o. 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 Caffie Pinto as outpatient, type 2 diabetes with neuropathy, hypertension, hyperlipidemia, obesity, BPH, anxiety, depression, GERD presented to hospital with shortness of breath and chest pain and was brought in the hospital when his vitals were noted to be stable.  WBC was elevated at 12.4 with hypokalemia at 3.0.  Troponins were negative.  Magnesium was low at 1.2.  COVID/influenza/RSV PCR negative.  UA with signs of infection (large amount of leukocytes and microscopy showing >50 WBCs and many bacteria).  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  Trace left pleural effusion seen. Patient was given oral potassium 40 mEq, IV calcium gluconate 1 g, ceftriaxone, and IV magnesium 2 gm and TR[facility] was consulted for admission to the hospital.  Assessment & Plan:   Chest pain: EKG without acute findings.  Troponins were negative.  CTA of the chest was negative for pulmonary embolism.  Likely atypical chest pain.  2D echocardiogram with preserved LV function at 70 to 75%.  Denies further chest pain.  Ascending thoracic aortic aneurysm: -Noted on CT.  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  Will need outpatient follow-up   Fever likely secondary to UTI:   On Rocephin.  Urine culture showing more than 100,000 colonies of group B strep.  Temperature max of 98.7 F today.  Mild leukocytosis noted but trending down. Influenza COVID and RSV was negative.  Will consider discontinuation of Jardiance on discharge..   Temperature max of 98.3 F.  Improved at this time.  Diarrhea: GI pathogen panel and C. difficile was negative.  Likely secondary to antibiotics.  Diarrhea has improved.  Hypomagnesemia: Replenished.  Latest magnesium of 2.2.   Hypocalcemia: Replenished   Hypokalemia: Improved after replacement.   Normocytic anemia:  Latest hemoglobin  at 9.8..  No evidence of bleeding.   Mild AKI:  Likely due to poor intake and diarrhea and diuretic use. Improved with IV fluids.  Latest creatinine at 1.1.   Normal anion gap metabolic acidosis: Likely in the setting of diarrhea and dehydration Improved after IV fluids.  Anion gap has resolved    Hyponatremia: Latest sodium at 134 from 129.   History of alcohol abuse: Was on CIWA protocol during hospitalization.  No signs of withdrawal.   Non-insulin-dependent type 2 diabetes: Hemoglobin A1c is pending.  Continue sliding scale insulin.  Latest POC glucose of 202.  Was on Jardiance and metformin at home.  Will discontinue Jardiance secondary to UTI.[name]  Debility, deconditioning, bilateral  knee pain, ambulatory dysfunction. X-ray of the right knee with degenerative changes and mild effusion.  Uric acid level was 7.9.  Patient he states that he has been having bilateral knee pain from degenerative joint disease and uses Icy[employer] at home.  He was supposed to go for knee replacement but has been putting it off.  On Voltaren gel with improvement. .Patient has been seen by physical therapy and recommended skilled nursing facility patient initially had refused going to skilled nursing facility but now is reconsidering.   DVT prophylaxis: SCDs Start: 08/16/22 0127  Code Status:     Code Status: Full Code  Disposition: Skilled nursing facility, TOC on board.  Status is: Inpatient  Remains inpatient appropriate because: Ambulatory dysfunction, debility, deconditioning, need for rehabilitation   Family Communication:  Spoke with patient's daughter Ms.  Sissy Hoff on the phone and updated her about the clinical condition of the patient on 08/19/2022.  Consultants:  None  Procedures:  None  Antimicrobials:  Rocephin IV 08/16/2022>  Anti-infectives (From admission, onward)    Start     Dose/Rate Route Frequency Ordered Stop   08/16/22 2100  cefTRIAXone (ROCEPHIN) 1 g in sodium chloride 0.9 % 100 mL IVPB        1 g 200 mL/hr over 30 Minutes Intravenous Every 24 hours 08/16/22 0131     08/15/22 2145  cefTRIAXone (ROCEPHIN) 1 g in sodium chloride 0.9 % 100 mL IVPB        1 g 200 mL/hr over 30 Minutes Intravenous  Once 08/15/22 2144 08/15/22 2230      Subjective:  Today, patient was seen and examined at bedside.  Patient stated that he changed his mind and wishes to go to rehabilitation at this time due to being by himself at home without good support, blindness and difficulty ambulating.   Objective: Vitals:   08/20/22 1601 08/20/22 2001 08/21/22 0401 08/21/22 0800  BP: 136/79 137/73 121/75 (!) 152/77  Pulse: 68 70 68 73  Resp: '18 14 15 15  '$ Temp: 98.3 F (36.8 C) 98.3 F (36.8 C) 98.3 F (36.8 C) 98 F (36.7 C)  TempSrc: Oral Oral Oral Oral  SpO2: 96%  96% 97%  Weight:      Height:        Intake/Output Summary (Last 24 hours) at 08/21/2022 1102 Last data filed at 08/21/2022 0700 Gross per 24 hour  Intake 100 ml  Output 601 ml  Net -501 ml    Filed Weights   08/15/22 1306 08/16/22 0423  Weight: 98.9 kg 90.7 kg    Physical Examination: Body mass index is 29.53 kg/m.   General: Alert awake and Communicative, not in obvious distress, elderly male, HENT:   No scleral pallor or icterus noted. Oral mucosa is moist.  Legally blindness. Chest:  Clear breath sounds.  No crackles or wheezes.  CVS: S1 &S2 heard. No murmur.  Regular rate and rhythm. Abdomen: Soft, nontender, nondistended.  Bowel sounds are heard.   Extremities: No cyanosis, clubbing or edema.  Peripheral pulses are palpable.  Mild bilateral knee joint  effusion.   Psych: Alert, awake and oriented, normal mood CNS:  No cranial nerve deficits.  Moves all the extremities. Skin: Warm and dry.  No rashes noted.  Data Reviewed:   CBC: Recent Labs  Lab 08/15/22 1259 08/16/22 0342 08/17/22 0235 08/18/22 0237 08/19/22 0847 08/20/22 0225  WBC 12.4* 12.3* 13.4* 9.8 11.5* 10.7*  NEUTROABS 9.8*  --   --   --   --   --   HGB 10.5* 11.3* 10.6* 10.0* 10.8* 9.8*  HCT 31.5* 33.8* 32.4* 30.3* 33.0* 28.5*  MCV 89.0 86.4 87.6 87.1 86.8 85.6  PLT 232 243 251 258 311 295     Basic Metabolic Panel: Recent Labs  Lab 08/16/22 0342 08/17/22 0235 08/18/22 0237 08/19/22 0847 08/20/22 0225  NA 129* 127* 129* 131* 134*  K 3.7 3.3* 3.4* 3.3* 3.8  CL 96* 98 101 106 105  CO2 20* 19* 22 21* 22  GLUCOSE 136* 134* 161* 189* 243*  BUN 11  $'8 11 11 14  'r$ CREATININE 1.29* 1.18 1.27* 1.04 1.15  CALCIUM 8.8* 8.2* 8.1* 8.3* 8.4*  MG 2.6* 2.2 2.3 2.1 2.2     Liver Function Tests: Recent Labs  Lab 08/16/22 0342  AST 11*  ALT 8  ALKPHOS 48  BILITOT 1.0  PROT 6.4*  ALBUMIN 2.5*      Radiology Studies: No results found.    LOS: 5 days    Flora Lipps, MD [facility] Available via Epic secure chat 7am-7pm After these hours, please refer to coverage provider listed on [email or web address] 08/21/2022, 11:02 AM

## 2022-08-21 NOTE — TOC Transition Note (Signed)
Transition of Care [facility]) - CM/SW Discharge Note   Patient Details  Name: Donald Calderon MRN: [id number] Date of Birth: 1940/12/07  [facility] CM/SW Contact:  Milas Gain, [place] Phone Number: 08/21/2022, 2:20 PM   Clinical Narrative:     Patient will DC to: [facility]   Anticipated DC date: 08/21/2022  Family notified: Stage manager by: Corey Harold  ?  Per MD patient ready for DC to [facility] . RN, patient, patient's family, and facility notified of DC. Discharge Summary sent to facility. RN given number for report tele# [phone] . DC packet on chart. Ambulance transport requested for patient.  CSW signing off.    Final next level of care: Skilled Nursing Facility Barriers to Discharge: No Barriers Identified   Patient Goals and CMS Choice CMS Medicare.gov Compare Post Acute Care list provided to:: Patient Choice offered to / list presented to : Patient  Discharge Placement                Patient chooses bed at: [facility] Patient to be transferred to facility by: [place] Name of family member notified: [name] Patient and family notified of of transfer: 08/21/22  Discharge Plan and Services Additional resources added to the After Visit Summary for   In-house Referral: Clinical Social Work Discharge Planning Services: CM Consult Post Acute Care Choice: Home Health            DME Agency: NA       HH Arranged: PT, OT, Nurse's Aide [place] Agency: [place] Date [place]: 08/20/22 Time [place]: [id number] Representative spoke with at [place]: [place] Determinants of Health ([place]) Interventions SDOH Screenings   Food Insecurity: No Food Insecurity (08/16/2022)  Housing: Low Risk  (08/16/2022)  Transportation Needs: No Transportation Needs (08/16/2022)  Utilities: Not At Risk (08/16/2022)  Tobacco Use: Medium Risk (08/16/2022)     Readmission Risk Interventions      No data to display

## 2022-08-21 NOTE — TOC Progression Note (Addendum)
Transition of Care [facility]) - Progression Note    Patient Details  Name: Donald Calderon MRN: [id number] Date of Birth: September 04, 1940  Transition of Care [facility]) CM/SW [place], [place] Phone Number: 08/21/2022, 10:24 AM  Clinical Narrative:     Update-1:48pm- CSW received insurance authorization approval for patient. Plan Auth ID# [id number], Candace Cruise ID# [id number]. Patients insurance authorization approval is from 3/8-3/12. CSW informed MD. Perrin Smack with [facility] confirmed facility can accept patient today if medically ready.  CSW informed by MD patient now wanting to go to short term rehab. CSW spoke with patient at bedside. Patient confirmed he is agreeable to SNF and agreeable to SN[facility] bed at [facility]. CSW spoke with Perrin Smack from [place] who confirmed SNF bed for patient. CSW started insurance authorization for patient. Candace Cruise ID# [id number]. CSW will continue to follow and assist with patients dc planning needs.   Expected Discharge Plan: [place] Barriers to Discharge: No Barriers Identified  Expected Discharge Plan and Services In-house Referral: Clinical Social Work Discharge Planning Services: CM Consult Post Acute Care Choice: [place] arrangements for the past 2 months: Single Family Home                   DME Agency: NA       HH Arranged: PT, OT, Nurse's Aide [place] Agency: [place] Date [facility] Agency Contacted: 08/20/22 Time [place]: [id number] Representative spoke with at [place]: [place] Determinants of Health ([place]) Interventions SDOH Screenings   Food Insecurity: No Food Insecurity (08/16/2022)  Housing: Low Risk  (08/16/2022)  Transportation Needs: No Transportation Needs (08/16/2022)  Utilities: Not At Risk (08/16/2022)  Tobacco Use: Medium Risk (08/16/2022)    Readmission Risk Interventions     No data to display

## 2022-08-24 LAB — HEMOGLOBIN A1C
Hgb A1c MFr Bld: 8 % — ABNORMAL HIGH (ref 4.8–5.6)
Mean Plasma Glucose: 183 mg/dL

## 2022-09-09 ENCOUNTER — Other Ambulatory Visit: Payer: Self-pay | Admitting: Internal Medicine

## 2023-04-01 ENCOUNTER — Telehealth: Payer: Self-pay | Admitting: Internal Medicine

## 2023-04-01 NOTE — Telephone Encounter (Signed)
Caller Early Osmond, RN) stated they had patient wear a heart monitor for 7 days and his results show abnormal reading.  Caller stated they will be sending over results for Dr. Trula Ore review.

## 2023-04-06 NOTE — Telephone Encounter (Signed)
Left message for patient or his daughter Donald Calderon to call back.

## 2023-04-06 NOTE — Telephone Encounter (Signed)
Spoke w patient's daughter.   Reviewed monitor result and recommendation for appointment.  She voices understanding and agreement.

## 2023-04-06 NOTE — Telephone Encounter (Signed)
Monitor report is scanned into chart.  Will forward to Dr. Lynnette Caffey for review and any recommendations.

## 2023-04-22 NOTE — Progress Notes (Signed)
Cardiology Office Note:  .   Date:  04/23/2023  ID:  Donald Calderon, DOB 08-02-1940, MRN [id number] PCP: Hillery Aldo, NP  [facility] Providers Cardiologist:  None    Patient Profile: .      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   Donald Calderon is a very pleasant 82 y.o. male who returns today after a 2 year absence, he is accompanied by his daughter. Recent cardiac monitor ordered by PCP revealed transient 3rd degree heart block early int he morning. He reports the monitor was ordered due to abnormal EKG when he established care with new PCP. He was asymptomatic at the time and felt well overall. He lives alone and is relatively sedentary, with no significant exertional activities. He reports occasional chest tightness, which is relieved with antacids. Has a home BP monitor but has not been monitoring consistently. Most bothered by leg pain, which is thought to be related to diabetes.  He denies palpitations, shortness of breath, orthopnea, PND, edema, presyncope, syncope.   Discussed the use of AI scribe software for clinical note transcription with the patient, who gave verbal consent to proceed.   ROS: See HPI       Studies Reviewed: [name]   EKG Interpretation Date/Time:  Friday April 23 2023 15:04:59 EST Ventricular Rate:  51 PR Interval:    QRS Duration:  88 QT Interval:  408 QTC Calculation: 376 R Axis:   -16  Text Interpretation: Sinus rhythm with A-V dissociation and Junctional rhythm with Sinus/atrial capture When compared with ECG of 16-Aug-2022 12:16, Junctional rhythm has replaced Sinus rhythm Vent. rate has decreased BY  28 BPM Confirmed by Eligha Bridegroom [phone]) on 04/23/2023 3:15:57 PM     Risk Assessment/Calculations:             Physical  Exam:   VS:  BP (!) 130/58 (BP Location: Left Arm, Patient Position: Sitting, Cuff Size: Large)   Pulse (!) 52   Resp 16   Ht 5\' 9"  (1.753 m)   Wt 197 lb (89.4 kg)   SpO2 96%   BMI 29.09 kg/m    Wt Readings from Last 3 Encounters:  04/23/23 197 lb (89.4 kg)  08/16/22 199 lb 15.3 oz (90.7 kg)  07/08/22 218 lb (98.9 kg)    GEN: Well nourished, well developed in no acute distress NECK: No JVD; No carotid bruits CARDIAC: RRR, no murmurs, rubs, gallops RESPIRATORY:  Clear to auscultation without rales, wheezing or rhonchi  ABDOMEN: Soft, non-tender, non-distended EXTREMITIES:  Bilateral LE edema; No deformity     ASSESSMENT AND PLAN: .    Heart block: Cardiac monitor completed 03/12/23 revealed varying degrees of heart block and bradycardia with minimum HR 27 bpm.  81 episodes of AV block lasting a total of 7 minutes 7 seconds occurred, additionally he had SVT and 2 episodes of NSVT with longest lasting 7 seconds. No presyncope or syncope but he admits he is not very active. EKG today reveals junctional rhythm. HR is 50 bpm. We will have him discontinue metoprolol and will refer him to EP for consideration of pacemaker. Advised him that if he becomes symptomatic to call us and seek immediate medication attention. Monitor heart rate and blood pressure at home several times a week.   Thoracic aortic  aneurysm:  Ascending aortic aneurysm stable at 4.5 cm on CT 08/2022. No current symptoms. Continue to avoid heavy lifting and straining, avoid fluoroquinolone antibiotics and continue annual monitoring.   Chronic HFpEF: LVEF 70-75%, no rwma, mild LVH, normal diastolic parameters on TTE 08/18/22. He does not monitor weight consistently. Bilateral LE edema, no dyspnea, orthopnea, PND. Volume status is stable.  We will continue furosemide, Jardiance, losartan. Discontinuing metoprolol in the setting of heart block as noted above.  Type 2 DM with neuropathy: A1c 7.2 on 11/30/2022.  He is limited by  neuropathy but continues to live independently and complete ADLs.  Management per PCP.       Dispo: Refer to EP for consideration of pacemaker implant  Signed, Eligha Bridegroom, NP-C

## 2023-04-23 ENCOUNTER — Ambulatory Visit: Payer: Medicare HMO | Attending: Nurse Practitioner | Admitting: Nurse Practitioner

## 2023-04-23 ENCOUNTER — Encounter: Payer: Self-pay | Admitting: Nurse Practitioner

## 2023-04-23 VITALS — BP 130/58 | HR 52 | Resp 16 | Ht 69.0 in | Wt 197.0 lb

## 2023-04-23 DIAGNOSIS — I442 Atrioventricular block, complete: Secondary | ICD-10-CM

## 2023-04-23 DIAGNOSIS — E0842 Diabetes mellitus due to underlying condition with diabetic polyneuropathy: Secondary | ICD-10-CM

## 2023-04-23 DIAGNOSIS — I7121 Aneurysm of the ascending aorta, without rupture: Secondary | ICD-10-CM | POA: Diagnosis not present

## 2023-04-23 DIAGNOSIS — I5032 Chronic diastolic (congestive) heart failure: Secondary | ICD-10-CM

## 2023-04-23 DIAGNOSIS — E1140 Type 2 diabetes mellitus with diabetic neuropathy, unspecified: Secondary | ICD-10-CM | POA: Diagnosis not present

## 2023-04-23 NOTE — Patient Instructions (Signed)
Medication Instructions:   DISCONTINUE Toprol.  *If you need a refill on your cardiac medications before your next appointment, please call your pharmacy*   Lab Work:  None ordered.  If you have labs (blood work) drawn today and your tests are completely normal, you will receive your results only by: MyChart Message (if you have MyChart) OR A paper copy in the mail If you have any lab test that is abnormal or we need to change your treatment, we will call you to review the results.   Testing/Procedures:  None ordered.   Follow-Up: At [facility], you and your health needs are our priority.  As part of our continuing mission to provide you with exceptional heart care, we have created designated Provider Care Teams.  These Care Teams include your primary Cardiologist (physician) and Advanced Practice Providers (APPs -  Physician Assistants and Nurse Practitioners) who all work together to provide you with the care you need, when you need it.  We recommend signing up for the patient portal called "MyChart".  Sign up information is provided on this After Visit Summary.  MyChart is used to connect with patients for Virtual Visits (Telemedicine).  Patients are able to view lab/test results, encounter notes, upcoming appointments, etc.  Non-urgent messages can be sent to your provider as well.   To learn more about what you can do with MyChart, go to [email or web address].    Your next appointment:   2 month(s)  Provider:   Steffanie Dunn, MD    Other Instructions  You have been referred to the EP doctor, Dr. Lalla Brothers for consideration of a pacemaker.    One of your tests has shown an aneurysm . The word "aneurysm" refers to a bulge in an artery (blood vessel). Most people think of them in the context of an emergency, but yours was found incidentally. At this point there is nothing you need to do from a procedure standpoint, but there are some important things to keep in  mind for day-to-day life.  Mainstays of therapy for aneurysms include very good blood pressure control, healthy lifestyle, and avoiding tobacco products and street drugs. Research has raised concern that antibiotics in the fluoroquinolone class could be associated with increased risk of having an aneurysm develop or tear. This includes medicines that end in "floxacin," like Cipro or Levaquin. Make sure to discuss this information with other healthcare providers if you require antibiotics.  Since aneurysms can run in families, you should discuss your diagnosis with first degree relatives as they may need to be screened for this. Regular mild-moderate physical exercise is important, but avoid heavy lifting/weight lifting over 30lbs, chopping wood, shoveling snow or digging heavy earth with a shovel. It is best to avoid activities that cause grunting or straining (medically referred to as a "Valsalva maneuver"). This happens when a person bears down against a closed throat to increase the strength of arm or abdominal muscles. There's often a tendency to do this when lifting heavy weights, doing sit-ups, push-ups or chin-ups, etc., but it may be harmful.  This is a finding I would expect to be monitored periodically by your cardiology team. Most unruptured thoracic aortic aneurysms cause no symptoms, so they are often found during exams for other conditions. Contact a health care provider if you develop any discomfort in your upper back, neck, abdomen, trouble swallowing, cough or hoarseness, or unexplained weight loss. Get help right away if you develop severe pain in your upper back or abdomen  that may move into your chest and arms, or any other concerning symptoms such as shortness of breath or fever.

## 2023-05-10 NOTE — Progress Notes (Deleted)
  Electrophysiology Office Note:   Date:  05/10/2023  ID:  Donald Calderon, DOB January 08, 1941, MRN [id number]  Primary Cardiologist: None Electrophysiologist: Nobie Putnam, MD  {Click to update primary MD,subspecialty MD or APP then REFRESH:1}    History of Present Illness:   Donald Calderon is a 82 y.o. male with h/o chronic diastolic heart failure, type II diabetes with neuropathy, thoracic aortic aneurysm who is being seen today for evaluation of bradycardia at the request of Eligha Bridegroom, NP.   Review of systems complete and found to be negative unless listed in HPI.   EP Information / Studies Reviewed:    {EKGtoday:28818}      EKG 04/23/23: High grade AV block   Echo 08/18/22: LVEF 70 to 75% hyperdynamic LV function.  Mild concentric LVH. Normal RV size and function. Mild aortic regurgitation.  Aortic sclerosis without stenosis. Ascending aortic aneurysm, measuring 47 mm.  Risk Assessment/Calculations:     No BP recorded.  {Refresh Note OR Click here to enter BP  :1}***        Physical Exam:   VS:  There were no vitals taken for this visit.   Wt Readings from Last 3 Encounters:  04/23/23 197 lb (89.4 kg)  08/16/22 199 lb 15.3 oz (90.7 kg)  07/08/22 218 lb (98.9 kg)     GEN: Well nourished, well developed in no acute distress NECK: No JVD; No carotid bruits CARDIAC: {EPRHYTHM:28826}, no murmurs, rubs, gallops RESPIRATORY:  Clear to auscultation without rales, wheezing or rhonchi  ABDOMEN: Soft, non-tender, non-distended EXTREMITIES:  No edema; No deformity   ASSESSMENT AND PLAN:   Donald Calderon is a 82 y.o. male with h/o chronic diastolic heart failure, type II diabetes with neuropathy, thoracic aortic aneurysm who is being seen today for evaluation of bradycardia at the request of Eligha Bridegroom, NP.    Follow up with {WGNFA:21308} {EPFOLLOW MV:78469}  Signed, Nobie Putnam, MD

## 2023-05-11 ENCOUNTER — Ambulatory Visit: Payer: Medicare HMO | Admitting: Cardiology

## 2023-05-12 ENCOUNTER — Ambulatory Visit: Payer: Medicare HMO | Attending: Cardiology | Admitting: Cardiology

## 2023-05-12 VITALS — BP 136/78 | HR 92 | Resp 18 | Ht 69.0 in | Wt 197.0 lb

## 2023-05-12 DIAGNOSIS — I442 Atrioventricular block, complete: Secondary | ICD-10-CM

## 2023-05-12 NOTE — Progress Notes (Signed)
Electrophysiology Office Note:   Date:  05/12/2023  ID:  Donald Calderon, DOB 1941/02/17, MRN [id number]  Primary Cardiologist: None Electrophysiologist: Nobie Putnam, MD      History of Present Illness:   Donald Calderon is a 82 y.o. male with h/o chronic diastolic heart failure, type II diabetes with neuropathy, thoracic aortic aneurysm who is being seen today for evaluation of bradycardia at the request of Eligha Bridegroom, NP.   Patient had Zio monitor ordered by his PCP. Circumstances of this are unclear from PCP notes. Zio monitor showed periods of AV block. Patient was referred to general cardiology. General cardiology saw patient on 11/8 and referred to EP for evaluation. ECG on day of general cardiology visit showed high grade AV block. Patient was on very low dose of metoprolol at the time. This was discontinued. Patient did not show up to his appointment with EP yesterday because he felt unwell. States that he felt weak in his legs, like he was not getting sufficient circulation. I called him and added him on to schedule today. Today, he is feeling better. He has frequent episodes of dizziness and lightheadedness but denies any syncopal events.   Review of systems complete and found to be negative unless listed in HPI.   EP Information / Studies Reviewed:    EKG is ordered today. Personal review as below.  EKG Interpretation Date/Time:  Wednesday May 12 2023 13:09:03 EST Ventricular Rate:  91 PR Interval:  208 QRS Duration:  72 QT Interval:  340 QTC Calculation: 418 R Axis:   -13  Text Interpretation: Sinus rhythm with Premature supraventricular complexes Inferior infarct , age undetermined When compared with ECG of 23-Apr-2023 15:04, Sinus rhythm has replaced Junctional rhythm and high grade AV block no longer present. Confirmed by Nobie Putnam [phone]) on 05/12/2023 2:29:58 PM   EKG 04/23/23: Sinus rhythm with high grade AV block and junctional escape beats   Echo  08/18/22: LVEF 70 to 75% hyperdynamic LV function.  Mild concentric LVH. Normal RV size and function. Mild aortic regurgitation.  Aortic sclerosis without stenosis. Ascending aortic aneurysm, measuring 47 mm.  Physical Exam:   VS:  BP 136/78 (BP Location: Left Arm, Patient Position: Sitting, Cuff Size: Normal)   Pulse 92   Resp 18   Ht 5\' 9"  (1.753 m)   Wt 197 lb (89.4 kg)   SpO2 98%   BMI 29.09 kg/m    Wt Readings from Last 3 Encounters:  05/12/23 197 lb (89.4 kg)  04/23/23 197 lb (89.4 kg)  08/16/22 199 lb 15.3 oz (90.7 kg)     GEN: Well nourished, well developed in no acute distress NECK: No JVD; No carotid bruits CARDIAC: Normal rate, regular rhythm RESPIRATORY:  Clear to auscultation without rales, wheezing or rhonchi  ABDOMEN: Soft, non-tender, non-distended EXTREMITIES:  No edema; No deformity   ASSESSMENT AND PLAN:   Donald Calderon is a 82 y.o. male with h/o chronic diastolic heart failure, type II diabetes with neuropathy, thoracic aortic aneurysm who is being seen today for evaluation of bradycardia at the request of Eligha Bridegroom, NP.   #. Intermittent high grade AV block: Seen on ECG 11/8 during office visit and on Zio monitor while reportedly awake.  - Symptomatic with frequent dizzy episodes. Today, he feels well and is conducting with narrow QRS.  - Patient had been on very low dose of metoprolol previously. He has a thoracic aortic aneurysm and would benefit from long term beta blocker therapy.  -  Plan for permanent pacemaker implant.  -Explained risks, benefits, and alternatives to PPM implantation, including but not limited to bleeding, infection, damage to heart or lungs, heart attack, stroke, or death.  Pt verbalized understanding and agrees to proceed if indicated.   Signed, Nobie Putnam, MD

## 2023-05-12 NOTE — Patient Instructions (Signed)
Medication Instructions:  Your physician recommends that you continue on your current medications as directed. Please refer to the Current Medication list given to you today. *If you need a refill on your cardiac medications before your next appointment, please call your pharmacy*   Lab Work: CBC and BMET Today If you have labs (blood work) drawn today and your tests are completely normal, you will receive your results only by: MyChart Message (if you have MyChart) OR A paper copy in the mail If you have any lab test that is abnormal or we need to change your treatment, we will call you to review the results.   Testing/Procedures: Pacemaker implant - see instruction letter Your physician has recommended that you have a pacemaker inserted. A pacemaker is a small device that is placed under the skin of your chest or abdomen to help control abnormal heart rhythms. This device uses electrical pulses to prompt the heart to beat at a normal rate. Pacemakers are used to treat heart rhythms that are too slow. Wire (leads) are attached to the pacemaker that goes into the chambers of you heart. This is done in the hospital and usually requires and overnight stay. Please see the instruction sheet given to you today for more information.   Follow-Up: At [facility], you and your health needs are our priority.  As part of our continuing mission to provide you with exceptional heart care, we have created designated Provider Care Teams.  These Care Teams include your primary Cardiologist (physician) and Advanced Practice Providers (APPs -  Physician Assistants and Nurse Practitioners) who all work together to provide you with the care you need, when you need it.  We recommend signing up for the patient portal called "MyChart".  Sign up information is provided on this After Visit Summary.  MyChart is used to connect with patients for Virtual Visits (Telemedicine).  Patients are able to view lab/test  results, encounter notes, upcoming appointments, etc.  Non-urgent messages can be sent to your provider as well.   To learn more about what you can do with MyChart, go to [email or web address].    Your next appointment:   We will schedule follow up after pacemaker implant  Provider:   Nobie Putnam, MD

## 2023-05-12 NOTE — H&P (View-Only) (Signed)
Electrophysiology Office Note:   Date:  05/12/2023  ID:  Donald Calderon, DOB 1941/02/17, MRN [id number]  Primary Cardiologist: None Electrophysiologist: Nobie Putnam, MD      History of Present Illness:   Donald Calderon is a 82 y.o. male with h/o chronic diastolic heart failure, type II diabetes with neuropathy, thoracic aortic aneurysm who is being seen today for evaluation of bradycardia at the request of Eligha Bridegroom, NP.   Patient had Zio monitor ordered by his PCP. Circumstances of this are unclear from PCP notes. Zio monitor showed periods of AV block. Patient was referred to general cardiology. General cardiology saw patient on 11/8 and referred to EP for evaluation. ECG on day of general cardiology visit showed high grade AV block. Patient was on very low dose of metoprolol at the time. This was discontinued. Patient did not show up to his appointment with EP yesterday because he felt unwell. States that he felt weak in his legs, like he was not getting sufficient circulation. I called him and added him on to schedule today. Today, he is feeling better. He has frequent episodes of dizziness and lightheadedness but denies any syncopal events.   Review of systems complete and found to be negative unless listed in HPI.   EP Information / Studies Reviewed:    EKG is ordered today. Personal review as below.  EKG Interpretation Date/Time:  Wednesday May 12 2023 13:09:03 EST Ventricular Rate:  91 PR Interval:  208 QRS Duration:  72 QT Interval:  340 QTC Calculation: 418 R Axis:   -13  Text Interpretation: Sinus rhythm with Premature supraventricular complexes Inferior infarct , age undetermined When compared with ECG of 23-Apr-2023 15:04, Sinus rhythm has replaced Junctional rhythm and high grade AV block no longer present. Confirmed by Nobie Putnam [phone]) on 05/12/2023 2:29:58 PM   EKG 04/23/23: Sinus rhythm with high grade AV block and junctional escape beats   Echo  08/18/22: LVEF 70 to 75% hyperdynamic LV function.  Mild concentric LVH. Normal RV size and function. Mild aortic regurgitation.  Aortic sclerosis without stenosis. Ascending aortic aneurysm, measuring 47 mm.  Physical Exam:   VS:  BP 136/78 (BP Location: Left Arm, Patient Position: Sitting, Cuff Size: Normal)   Pulse 92   Resp 18   Ht 5\' 9"  (1.753 m)   Wt 197 lb (89.4 kg)   SpO2 98%   BMI 29.09 kg/m    Wt Readings from Last 3 Encounters:  05/12/23 197 lb (89.4 kg)  04/23/23 197 lb (89.4 kg)  08/16/22 199 lb 15.3 oz (90.7 kg)     GEN: Well nourished, well developed in no acute distress NECK: No JVD; No carotid bruits CARDIAC: Normal rate, regular rhythm RESPIRATORY:  Clear to auscultation without rales, wheezing or rhonchi  ABDOMEN: Soft, non-tender, non-distended EXTREMITIES:  No edema; No deformity   ASSESSMENT AND PLAN:   Donald Calderon is a 82 y.o. male with h/o chronic diastolic heart failure, type II diabetes with neuropathy, thoracic aortic aneurysm who is being seen today for evaluation of bradycardia at the request of Eligha Bridegroom, NP.   #. Intermittent high grade AV block: Seen on ECG 11/8 during office visit and on Zio monitor while reportedly awake.  - Symptomatic with frequent dizzy episodes. Today, he feels well and is conducting with narrow QRS.  - Patient had been on very low dose of metoprolol previously. He has a thoracic aortic aneurysm and would benefit from long term beta blocker therapy.  -  Plan for permanent pacemaker implant.  -Explained risks, benefits, and alternatives to PPM implantation, including but not limited to bleeding, infection, damage to heart or lungs, heart attack, stroke, or death.  Pt verbalized understanding and agrees to proceed if indicated.   Signed, Nobie Putnam, MD

## 2023-05-13 LAB — BASIC METABOLIC PANEL
BUN/Creatinine Ratio: 6 — ABNORMAL LOW (ref 10–24)
BUN: 8 mg/dL (ref 8–27)
CO2: 23 mmol/L (ref 20–29)
Calcium: 8.7 mg/dL (ref 8.6–10.2)
Chloride: 101 mmol/L (ref 96–106)
Creatinine, Ser: 1.38 mg/dL — ABNORMAL HIGH (ref 0.76–1.27)
Glucose: 147 mg/dL — ABNORMAL HIGH (ref 70–99)
Potassium: 4.3 mmol/L (ref 3.5–5.2)
Sodium: 137 mmol/L (ref 134–144)
eGFR: 51 mL/min/{1.73_m2} — ABNORMAL LOW (ref >59)

## 2023-05-13 LAB — CBC
Hematocrit: 37 % — ABNORMAL LOW (ref 37.5–51.0)
Hemoglobin: 12 g/dL — ABNORMAL LOW (ref 13.0–17.7)
MCH: 30.8 pg (ref 26.6–33.0)
MCHC: 32.4 g/dL (ref 31.5–35.7)
MCV: 95 fL (ref 79–97)
Platelets: 279 10*3/uL (ref 150–450)
RBC: 3.89 x10E6/uL — ABNORMAL LOW (ref 4.14–5.80)
RDW: 13.6 % (ref 11.6–15.4)
WBC: 7.7 10*3/uL (ref 3.4–10.8)

## 2023-05-19 NOTE — Pre-Procedure Instructions (Signed)
Instructed patient on the following items: Arrival time 1030  Nothing to eat or drink after midnight No meds AM of procedure Responsible person to drive you home and stay with you for 24 hrs Wash with special soap night before and morning of procedure 

## 2023-05-20 ENCOUNTER — Other Ambulatory Visit: Payer: Self-pay

## 2023-05-20 ENCOUNTER — Ambulatory Visit (HOSPITAL_COMMUNITY)
Admission: RE | Admit: 2023-05-20 | Discharge: 2023-05-21 | Disposition: A | Discharge status: Home / Self Care | Payer: Medicare HMO | Source: Ambulatory Visit | Attending: Cardiology | Admitting: Cardiology

## 2023-05-20 ENCOUNTER — Encounter (HOSPITAL_COMMUNITY)
Admission: RE | Disposition: A | Discharge status: Home / Self Care | Payer: Self-pay | Source: Ambulatory Visit | Attending: Cardiology

## 2023-05-20 DIAGNOSIS — E1140 Type 2 diabetes mellitus with diabetic neuropathy, unspecified: Secondary | ICD-10-CM | POA: Diagnosis not present

## 2023-05-20 DIAGNOSIS — I712 Thoracic aortic aneurysm, without rupture, unspecified: Secondary | ICD-10-CM | POA: Diagnosis not present

## 2023-05-20 DIAGNOSIS — I5032 Chronic diastolic (congestive) heart failure: Secondary | ICD-10-CM | POA: Diagnosis not present

## 2023-05-20 DIAGNOSIS — I442 Atrioventricular block, complete: Secondary | ICD-10-CM | POA: Diagnosis present

## 2023-05-20 DIAGNOSIS — Z79899 Other long term (current) drug therapy: Secondary | ICD-10-CM | POA: Diagnosis not present

## 2023-05-20 HISTORY — PX: PACEMAKER IMPLANT: EP1218

## 2023-05-20 LAB — GLUCOSE, CAPILLARY
Glucose-Capillary: 87 mg/dL (ref 70–99)
Glucose-Capillary: 127 mg/dL — ABNORMAL HIGH (ref 70–99)

## 2023-05-20 SURGERY — PACEMAKER IMPLANT

## 2023-05-20 MED ORDER — SODIUM CHLORIDE 0.9 % IV SOLN
INTRAVENOUS | Status: AC
  Filled 2023-05-20: qty 2

## 2023-05-20 MED ORDER — EMPAGLIFLOZIN 10 MG PO TABS
10.0000 mg | ORAL_TABLET | Freq: Every day | ORAL | Status: DC
  Filled 2023-05-20: qty 1

## 2023-05-20 MED ORDER — FENTANYL CITRATE (PF) 100 MCG/2ML IJ SOLN
INTRAMUSCULAR | Status: AC
  Filled 2023-05-20: qty 2

## 2023-05-20 MED ORDER — SODIUM CHLORIDE 0.9 % IV SOLN: INTRAVENOUS | Status: DC

## 2023-05-20 MED ORDER — ACETAMINOPHEN 325 MG PO TABS: 650.0000 mg | ORAL_TABLET | Freq: Four times a day (QID) | ORAL | Status: DC | PRN

## 2023-05-20 MED ORDER — HEPARIN (PORCINE) IN NACL 1000-0.9 UT/500ML-% IV SOLN
INTRAVENOUS | Status: DC | PRN
  Administered 2023-05-20: 500 mL

## 2023-05-20 MED ORDER — MIDAZOLAM HCL 5 MG/5ML IJ SOLN
INTRAMUSCULAR | Status: DC | PRN
  Administered 2023-05-20: .5 mg via INTRAVENOUS
  Administered 2023-05-20 (×2): 1 mg via INTRAVENOUS
  Administered 2023-05-20: .5 mg via INTRAVENOUS

## 2023-05-20 MED ORDER — CEFAZOLIN SODIUM-DEXTROSE 2-4 GM/100ML-% IV SOLN
INTRAVENOUS | Status: AC
  Filled 2023-05-20: qty 100

## 2023-05-20 MED ORDER — VANCOMYCIN HCL IN DEXTROSE 1-5 GM/200ML-% IV SOLN: 1000.0000 mg | Freq: Once | INTRAVENOUS | Status: DC

## 2023-05-20 MED ORDER — LOSARTAN POTASSIUM 25 MG PO TABS
12.5000 mg | ORAL_TABLET | Freq: Every day | ORAL | Status: DC
  Administered 2023-05-21: 12.5 mg via ORAL
  Filled 2023-05-20: qty 1

## 2023-05-20 MED ORDER — SODIUM CHLORIDE 0.9 % IV SOLN
80.0000 mg | INTRAVENOUS | Status: AC
  Administered 2023-05-20: 80 mg

## 2023-05-20 MED ORDER — LIDOCAINE HCL (PF) 1 % IJ SOLN
INTRAMUSCULAR | Status: DC | PRN
  Administered 2023-05-20: 60 mL

## 2023-05-20 MED ORDER — FINASTERIDE 5 MG PO TABS
5.0000 mg | ORAL_TABLET | Freq: Every day | ORAL | Status: DC
  Administered 2023-05-21: 5 mg via ORAL
  Filled 2023-05-20: qty 1

## 2023-05-20 MED ORDER — CEFAZOLIN SODIUM-DEXTROSE 2-4 GM/100ML-% IV SOLN: 2.0000 g | INTRAVENOUS | Status: DC

## 2023-05-20 MED ORDER — LIDOCAINE HCL (PF) 1 % IJ SOLN
INTRAMUSCULAR | Status: AC
  Filled 2023-05-20: qty 30

## 2023-05-20 MED ORDER — PANTOPRAZOLE SODIUM 40 MG PO TBEC
40.0000 mg | DELAYED_RELEASE_TABLET | Freq: Every day | ORAL | Status: DC
  Administered 2023-05-21: 40 mg via ORAL
  Filled 2023-05-20: qty 1

## 2023-05-20 MED ORDER — AMLODIPINE BESYLATE 5 MG PO TABS
5.0000 mg | ORAL_TABLET | Freq: Every day | ORAL | Status: DC
  Administered 2023-05-21: 5 mg via ORAL
  Filled 2023-05-20 (×2): qty 1

## 2023-05-20 MED ORDER — DORZOLAMIDE HCL-TIMOLOL MAL 2-0.5 % OP SOLN
1.0000 [drp] | Freq: Two times a day (BID) | OPHTHALMIC | Status: DC
  Filled 2023-05-20: qty 10

## 2023-05-20 MED ORDER — LIDOCAINE HCL 1 % IJ SOLN
INTRAMUSCULAR | Status: AC
  Filled 2023-05-20: qty 60

## 2023-05-20 MED ORDER — TAMSULOSIN HCL 0.4 MG PO CAPS
0.4000 mg | ORAL_CAPSULE | Freq: Every day | ORAL | Status: DC
  Administered 2023-05-20: 0.4 mg via ORAL
  Filled 2023-05-20: qty 1

## 2023-05-20 MED ORDER — GABAPENTIN 300 MG PO CAPS
600.0000 mg | ORAL_CAPSULE | Freq: Every day | ORAL | Status: DC
Start: 2023-05-20 — End: 2023-05-21
  Administered 2023-05-20: 600 mg via ORAL
  Filled 2023-05-20: qty 2

## 2023-05-20 MED ORDER — VANCOMYCIN HCL IN DEXTROSE 1-5 GM/200ML-% IV SOLN
INTRAVENOUS | Status: AC
  Administered 2023-05-20: 1000 mg
  Filled 2023-05-20: qty 200

## 2023-05-20 MED ORDER — PRAVASTATIN SODIUM 10 MG PO TABS
10.0000 mg | ORAL_TABLET | Freq: Every day | ORAL | Status: DC
  Administered 2023-05-20: 10 mg via ORAL
  Filled 2023-05-20: qty 1

## 2023-05-20 MED ORDER — METFORMIN HCL 500 MG PO TABS
1000.0000 mg | ORAL_TABLET | Freq: Two times a day (BID) | ORAL | Status: DC
  Administered 2023-05-21: 1000 mg via ORAL
  Filled 2023-05-20: qty 2

## 2023-05-20 MED ORDER — ACETAMINOPHEN 325 MG PO TABS
ORAL_TABLET | ORAL | Status: AC
  Administered 2023-05-20: 650 mg via ORAL
  Filled 2023-05-20: qty 2

## 2023-05-20 MED ORDER — POVIDONE-IODINE 10 % EX SWAB
2.0000 | Freq: Once | CUTANEOUS | Status: AC
  Administered 2023-05-20: 2 via TOPICAL

## 2023-05-20 MED ORDER — FENTANYL CITRATE (PF) 100 MCG/2ML IJ SOLN
INTRAMUSCULAR | Status: DC | PRN
  Administered 2023-05-20 (×4): 50 ug via INTRAVENOUS

## 2023-05-20 MED ORDER — MIDAZOLAM HCL 5 MG/5ML IJ SOLN
INTRAMUSCULAR | Status: AC
  Filled 2023-05-20: qty 5

## 2023-05-20 SURGICAL SUPPLY — 13 items
CABLE SURGICAL S-101-97-12 (CABLE) ×1 IMPLANT
CATH RIGHTSITE C315HIS02 (CATHETERS) IMPLANT
IPG PACE AZUR XT DR MRI W1DR01 (Pacemaker) IMPLANT
LEAD CAPSURE NOVUS 5076-52CM (Lead) IMPLANT
LEAD SELECT SECURE 3830 383069 (Lead) IMPLANT
PACE AZURE XT DR MRI W1DR01 (Pacemaker) ×1 IMPLANT
PAD DEFIB RADIO PHYSIO CONN (PAD) ×1 IMPLANT
SELECT SECURE 3830 383069 (Lead) ×1 IMPLANT
SHEATH 7FR PRELUDE SNAP 13 (SHEATH) IMPLANT
SHEATH PROBE COVER 6X72 (BAG) IMPLANT
SLITTER 6232ADJ (MISCELLANEOUS) IMPLANT
TRAY PACEMAKER INSERTION (PACKS) ×1 IMPLANT
WIRE HI TORQ VERSACORE-J 145CM (WIRE) IMPLANT

## 2023-05-20 NOTE — Interval H&P Note (Signed)
History and Physical Interval Note:  05/20/2023 1:35 PM  Donald Calderon  has presented today for surgery, with the diagnosis of complete heart block.  The various methods of treatment have been discussed with the patient and family. After consideration of risks, benefits and other options for treatment, the patient has consented to  Procedure(s): PACEMAKER IMPLANT - DUAL CHAMBER (N/A) as a surgical intervention.  The patient's history has been reviewed, patient examined, no change in status, stable for surgery.  I have reviewed the patient's chart and labs.  Questions were answered to the patient's satisfaction.     Nobie Putnam

## 2023-05-20 NOTE — Progress Notes (Addendum)
1915 - Notified Dr. Jimmey Ralph via phone of increasing pain/discomfort in upper chest. Pt unable to describe, but say it worsens upon inspiration. EKG show sinus rhythm. Tylenol ordered by MD. No other new orders at this time.

## 2023-05-21 ENCOUNTER — Encounter (HOSPITAL_COMMUNITY): Payer: Self-pay | Admitting: Cardiology

## 2023-05-21 ENCOUNTER — Telehealth: Payer: Self-pay

## 2023-05-21 ENCOUNTER — Ambulatory Visit (HOSPITAL_COMMUNITY): Payer: Medicare HMO

## 2023-05-21 DIAGNOSIS — I442 Atrioventricular block, complete: Secondary | ICD-10-CM

## 2023-05-21 DIAGNOSIS — I5032 Chronic diastolic (congestive) heart failure: Secondary | ICD-10-CM | POA: Diagnosis not present

## 2023-05-21 DIAGNOSIS — I712 Thoracic aortic aneurysm, without rupture, unspecified: Secondary | ICD-10-CM | POA: Diagnosis not present

## 2023-05-21 DIAGNOSIS — E1140 Type 2 diabetes mellitus with diabetic neuropathy, unspecified: Secondary | ICD-10-CM | POA: Diagnosis not present

## 2023-05-21 MED ORDER — ACETAMINOPHEN 325 MG PO TABS
325.0000 mg | ORAL_TABLET | ORAL | Status: DC | PRN
  Administered 2023-05-21: 650 mg via ORAL
  Filled 2023-05-21: qty 2

## 2023-05-21 MED ORDER — ONDANSETRON HCL 4 MG/2ML IJ SOLN: 4.0000 mg | Freq: Four times a day (QID) | INTRAMUSCULAR | Status: DC | PRN

## 2023-05-21 NOTE — Progress Notes (Signed)
  Pt feeling OK this am. Site looks great.   Had significant pleuritic chest pain last night with deep inspiration that has almost completely resolved.   Will observe for several hours. If no further pain, will discharge and follow closely.   If pain fails to resolve, or worsens, will potentially need to consider lead revision per Dr. Jimmey Ralph.   Casimiro Needle [phone] [place], PA-C  05/21/2023 8:47 AM

## 2023-05-21 NOTE — TOC Transition Note (Signed)
Transition of Care [facility]) - CM/SW Discharge Note   Patient Details  Name: Donald Calderon MRN: [id number] Date of Birth: [birth year]  [facility] CM/SW Contact:  Leone Haven, RN Phone Number: 05/21/2023, 10:50 AM   Clinical Narrative:    For dc today, his son will transport him home. He has no needs.   Final next level of care: Home/Self Care Barriers to Discharge: No Barriers Identified   Patient Goals and CMS Choice      Discharge Placement                         Discharge Plan and Services Additional resources added to the After Visit Summary for   In-house Referral: NA Discharge Planning Services: CM Consult Post Acute Care Choice: NA          DME Arranged: N/A DME Agency: NA       HH Arranged: NA          Social Determinants of Health (SDOH) Interventions SDOH Screenings   Food Insecurity: No Food Insecurity (05/20/2023)  Housing: Medium Risk (05/21/2023)  Transportation Needs: No Transportation Needs (05/21/2023)  Utilities: Not At Risk (05/21/2023)  Financial Resource Strain: Not on File (10/02/2021)   Received from [place], [place]  Physical Activity: Not on File (10/02/2021)   Received from [place], [place]  Social Connections: Not on File (03/03/2023)   Received from [facility]  Stress: Not on File (10/02/2021)   Received from [place], [place]  Tobacco Use: Medium Risk (04/23/2023)     Readmission Risk Interventions     No data to display

## 2023-05-21 NOTE — Discharge Summary (Signed)
ELECTROPHYSIOLOGY PROCEDURE DISCHARGE SUMMARY    Patient ID: Donald Calderon,  MRN: [id number], DOB/AGE: 11-23-1940 82 y.o.  Admit date: 05/20/2023 Discharge date: 05/21/2023  Primary Care Physician: Karenann Cai, NP  Primary Cardiologist: None  Electrophysiologist: Dr. 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  Implantation of a Medtronic Dual Chamber PPM on 12/5 by Dr. Jimmey Ralph. The patient received a Medtronic Azure XT DR [id number]  with a Medtronic CapSureFix Novus 5076 right atrial lead and a Medtronic SelectSure 3830 right ventricular lead.  There were no immediate post procedure complications.   2.  CXR on 05/21/2023 demonstrated no pneumothorax status post device implantation.       Brief HPI: Donald Calderon is a 82 y.o. male was referred to electrophysiology in the outpatient setting for consideration of PPM implantation.  Past medical history includes above.  The patient has had AV block without reversible causes identified.  Risks, benefits, and alternatives to PPM implantation were reviewed with the patient who wished to proceed.   Hospital Course:  The patient was admitted and underwent implantation of a Medtronic dual chamber PPM with details as outlined above.  He was monitored on telemetry overnight which demonstrated appropriate pacing. Left chest was without hematoma or ecchymosis.  The device was interrogated and found to be functioning normally.  CXR was obtained and demonstrated no pneumothorax status post device implantation.  Wound care, arm mobility, and restrictions were reviewed with the patient.  The pt did  have new pleuritic chest pain post case that significantly improved overnight. Was examined by Dr. Jimmey Ralph and felt OK for discharge as long as pain continued to improve. Pt observed for several hours with continued resolution of discomfort; Reported now as only very mild with very deep breathing. Pt verbalizes understanding to return if symptoms recur or worsen.   Anticoagulation resumption This patient is not on anticoagulation     Physical Exam: Vitals:   05/21/23 0416 05/21/23 0649 05/21/23 0724 05/21/23 0850  BP:   121/71 121/71  Pulse:   72   Resp: 12  17   Temp:   98.4 F (36.9 C)   TempSrc:   Oral   SpO2:   97%   Weight:  89.4 kg    Height:        GEN- NAD. A&O x 3.  HEENT: Normocephalic, atraumatic Lungs- CTAB, Normal effort.  Heart- RRR, No M/G/R.  GI- Soft, NT, ND.  Extremities- No clubbing, cyanosis, or edema;  Skin- warm and dry, no rash or lesion, left chest without hematoma/ecchymosis  Discharge Medications:  Allergies as of 05/21/2023       Reactions   Shellfish Allergy Anaphylaxis   Throat and tongue swells   Penicillins Itching, Rash   ** Tolerates cephalosporins Tolerated Ancef        Medication List     STOP taking these medications    Jardiance 10 MG Tabs tablet Generic drug: empagliflozin       TAKE these medications    acetaminophen 325 MG tablet Commonly known as: TYLENOL Take 2 tablets (650 mg total) by mouth every 6 (six) hours as needed for  mild pain or moderate pain (or Fever >/= 101).   amLODipine 10 MG tablet Commonly known as: NORVASC Take 10 mg by mouth daily.   finasteride 5 MG tablet Commonly known as: PROSCAR Take 5 mg by mouth daily.   fluticasone 50 MCG/ACT nasal spray Commonly known as: FLONASE Place 1 spray into both nostrils daily as needed for rhinitis.   furosemide 40 MG tablet Commonly known as: LASIX TAKE 1 TABLET BY MOUTH DAILY   gabapentin 300 MG capsule Commonly known as: NEURONTIN Take 2 capsules  (600 mg total) by mouth at bedtime. What changed:  how much to take when to take this additional instructions   levocetirizine 5 MG tablet Commonly known as: XYZAL Take 5 mg by mouth daily as needed for allergies.   losartan 25 MG tablet Commonly known as: COZAAR TAKE 1/2 TABLET(12.5 MG) BY MOUTH DAILY What changed: See the new instructions.   metFORMIN 1000 MG tablet Commonly known as: GLUCOPHAGE Take 1,000 mg by mouth 2 (two) times daily with a meal.   metoprolol succinate 25 MG 24 hr tablet Commonly known as: TOPROL-XL Take 1 tablet by mouth daily.   pantoprazole 40 MG tablet Commonly known as: PROTONIX Take 40 mg by mouth daily.   polyethylene glycol 17 g packet Commonly known as: MIRALAX / GLYCOLAX Take 17 g by mouth daily as needed for mild constipation.   potassium chloride 10 MEQ tablet Commonly known as: KLOR-CON Take 10 mEq by mouth daily.   pravastatin 10 MG tablet Commonly known as: PRAVACHOL Take 1 tablet (10 mg total) by mouth every evening.   tamsulosin 0.4 MG Caps capsule Commonly known as: FLOMAX Take 1 capsule (0.4 mg total) by mouth daily after supper.        Disposition:  Home with usual follow up as in AVS   Duration of Discharge Encounter: Greater than 30 minutes including physician time.  Dustin Flock, PA-C  05/21/2023 10:31 AM

## 2023-05-21 NOTE — Discharge Instructions (Signed)
After Your Pacemaker   You have a Medtronic Pacemaker  ACTIVITY Do not lift your arm above shoulder height for 1 week after your procedure. After 7 days, you may progress as below.  You should remove your sling 24 hours after your procedure, unless otherwise instructed by your provider.     Friday May 28, 2023  Saturday May 29, 2023 Sunday May 30, 2023 Monday May 31, 2023   Do not lift, push, pull, or carry anything over 10 pounds with the affected arm until 6 weeks (Friday July 02, 2023 ) after your procedure.   You may drive AFTER your wound check, unless you have been told otherwise by your provider.   Ask your healthcare provider when you can go back to work   INCISION/Dressing If you are on a blood thinner such as Coumadin, Xarelto, Eliquis, Plavix, or Pradaxa please confirm with your provider when this should be resumed.   If large square, outer bandage is left in place, this can be removed after 24 hours from your procedure. Do not remove steri-strips or glue as below.   If a PRESSURE DRESSING (a bulky dressing that usually goes up over your shoulder) was applied or left in place, please follow instructions given by your provider on when to return to have this removed.   Monitor your Pacemaker site for redness, swelling, and drainage. Call the device clinic at [phone] if you experience these symptoms or fever/chills.  If your incision is sealed with Steri-strips or staples, you may shower 7 days after your procedure or when told by your provider. Do not remove the steri-strips or let the shower hit directly on your site. You may wash around your site with soap and water.    If you were discharged in a sling, please do not wear this during the day more than 48 hours after your surgery unless otherwise instructed. This may increase the risk of stiffness and soreness in your shoulder.   Avoid lotions, ointments, or perfumes over your incision until it  is well-healed.  You may use a hot tub or a pool AFTER your wound check appointment if the incision is completely closed.  Pacemaker Alerts:  Some alerts are vibratory and others beep. These are NOT emergencies. Please call our office to let us know. If this occurs at night or on weekends, it can wait until the next business day. Send a remote transmission.  If your device is capable of reading fluid status (for heart failure), you will be offered monthly monitoring to review this with you.   DEVICE MANAGEMENT Remote monitoring is used to monitor your pacemaker from home. This monitoring is scheduled every 91 days by our office. It allows [place] to keep an eye on the functioning of your device to ensure it is working properly. You will routinely see your Electrophysiologist annually (more often if necessary).   You should receive your ID card for your new device in 4-8 weeks. Keep this card with you at all times once received. Consider wearing a medical alert bracelet or necklace.  Your Pacemaker may be MRI compatible. This will be discussed at your next office visit/wound check.  You should avoid contact with strong electric or magnetic fields.   Do not use amateur (ham) radio equipment or electric (arc) welding torches. MP3 player headphones with magnets should not be used. Some devices are safe to use if held at least 12 inches (30 cm) from your Pacemaker. These include power tools, lawn  mowers, and speakers. If you are unsure if something is safe to use, ask your health care provider.  When using your cell phone, hold it to the ear that is on the opposite side from the Pacemaker. Do not leave your cell phone in a pocket over the Pacemaker.  You may safely use electric blankets, heating pads, computers, and microwave ovens.  Call the office right away if: You have chest pain. You feel more short of breath than you have felt before. You feel more light-headed than you have felt before. Your  incision starts to open up.  This information is not intended to replace advice given to you by your health care provider. Make sure you discuss any questions you have with your health care provider.

## 2023-05-21 NOTE — Telephone Encounter (Signed)
Follow-up after same day discharge: Implant date: 05/20/2023 MD: Nobie Putnam  Device: Medtronic [id number] Azure XT Dr MRI Pacemaker Location: Left Chest   Wound check visit: 06/04/2023 90 day MD follow-up: 09/10/2023  Remote Transmission received:05/21/2023  Dressing/sling removed: Dressing has been removed at hospital  Confirm [facility] restart on:   Please continue to monitor your cardiac device site for redness, swelling, and drainage. Call the device clinic at [phone] if you experience these symptoms, fever/chills, or have questions about your device.   Remote monitoring is used to monitor your cardiac device from home. This monitoring is scheduled every 91 days by our office. It allows [place] to keep an eye on the functioning of your device to ensure it is working properly.

## 2023-05-21 NOTE — TOC Initial Note (Signed)
Transition of Care [facility]) - Initial/Assessment Note    Patient Details  Name: Donald Calderon MRN: [id number] Date of Birth: [birth year]  Transition of Care [facility]) CM/SW Contact:    Leone Haven, RN Phone Number: 05/21/2023, 10:48 AM  Clinical Narrative:                 From home alone, has PCP and insurance on file, states has a person who comes by every day to help him with groceries, clean the house etc. He has a walker and a w/chair at home.  States family member will transport him home at [employer] and family is support system, states gets medications from [facility] [address].  Pta ambulatory with walker.  Expected Discharge Plan: Home/Self Care Barriers to Discharge: No Barriers Identified   Patient Goals and CMS Choice Patient states their goals for this hospitalization and ongoing recovery are:: return home          Expected Discharge Plan and Services In-house Referral: NA Discharge Planning Services: CM Consult Post Acute Care Choice: NA Living arrangements for the past 2 months: Single Family Home Expected Discharge Date: 05/21/23               DME Arranged: N/A DME Agency: NA       HH Arranged: NA          Prior Living Arrangements/Services Living arrangements for the past 2 months: Single Family Home Lives with:: Self Patient language and need for interpreter reviewed:: Yes Do you feel safe going back to the place where you live?: Yes      Need for Family Participation in Patient Care: Yes (Comment) Care giver support system in place?: Yes (comment) Current home services: DME (walker, w/chair) Criminal Activity/Legal Involvement Pertinent to Current Situation/Hospitalization: No - Comment as needed  Activities of Daily Living   ADL Screening (condition at time of admission) Independently performs ADLs?: Yes (appropriate for developmental age) Is the patient deaf or have difficulty hearing?: No Does the patient have difficulty seeing, even when  wearing glasses/contacts?: Yes Does the patient have difficulty concentrating, remembering, or making decisions?: No  Permission Sought/Granted Permission sought to share information with : Case Manager Permission granted to share information with : Yes, Verbal Permission Granted              Emotional Assessment Appearance:: Appears stated age Attitude/Demeanor/Rapport: Engaged Affect (typically observed): Appropriate Orientation: : Oriented to Self, Oriented to Place, Oriented to  Time, Oriented to Situation Alcohol / Substance Use: Not Applicable Psych Involvement: No (comment)  Admission diagnosis:  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] Patient Active Problem List   Diagnosis Date Noted   Complete heart block (HCC) 05/20/2023   Chest pain 08/16/2022   Shortness of breath 08/16/2022   Cough 08/16/2022   Diarrhea 08/16/2022   Hypomagnesemia 08/16/2022   Hypocalcemia 08/16/2022   Normal anion gap metabolic acidosis 08/16/2022   History of alcohol abuse 08/16/2022   Slurred speech 07/03/2021   Sepsis due to undetermined organism (HCC) 05/11/2019   Hyperkalemia 05/11/2019   Hyperbilirubinemia 05/11/2019   Pressure injury of skin 03/10/2017   UTI (urinary tract infection) 03/07/2017   Acute metabolic encephalopathy 03/07/2017   Encephalopathy 03/07/2017   Normocytic anemia 02/23/2017   Neurogenic bladder 02/23/2017   AKI (acute kidney injury) (HCC) 02/23/2017   Sepsis secondary to UTI (HCC) 02/22/2017   Postoperative ileus (HCC) 02/12/2017   Urine retention 01/29/2017   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   Lumbar stenosis 01/26/2017  Type 2 diabetes mellitus with obesity (HCC)    Hypertension associated with diabetes (HCC)    ETOH abuse    Chronic pain syndrome    Hyponatremia    Leukocytosis    Acute blood loss anemia    Epidural hematoma (HCC) 01/23/2017   Status post lumbar surgery 01/21/2017   Status post lumbar spine operation 01/21/2017   Chronic back pain  09/27/2011   Mononeuritis 08/08/2010   SWELLING MASS OR LUMP IN HEAD AND NECK 06/27/2010   BENIGN PROSTATIC HYPERTROPHY, WITH URINARY OBSTRUCTION 06/19/2010   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   Hypokalemia 03/18/2010   Lipoma 02/14/2010   Type 2 diabetes mellitus (HCC) 02/07/2010   HYPERLIPIDEMIA 02/07/2010   Alcohol abuse 02/07/2010   Essential hypertension 02/07/2010   SPINAL STENOSIS, LUMBAR 02/07/2010   PCP:  Karenann Cai, NP Pharmacy:   [facility] [phone] - [place], [place] - [address] AT [facility] [address] [place] [address] Phone: [phone] Fax: [phone]  [id number] - [place], [place] - [address] AT [facility] [address] [facility] [address] [place] [address] Phone: [phone] Fax: [phone]     Social Determinants of Health (SDOH) Social History: SDOH Screenings   Food Insecurity: No Food Insecurity (05/20/2023)  Housing: Medium Risk (05/21/2023)  Transportation Needs: No Transportation Needs (05/21/2023)  Utilities: Not At Risk (05/21/2023)  Financial Resource Strain: Not on File (10/02/2021)   Received from [place], [place]  Physical Activity: Not on File (10/02/2021)   Received from [place], [place]  Social Connections: Not on File (03/03/2023)   Received from [facility]  Stress: Not on File (10/02/2021)   Received from [place], [place]  Tobacco Use: Medium Risk (04/23/2023)   SDOH Interventions:     Readmission Risk Interventions     No data to display

## 2023-05-21 NOTE — Plan of Care (Signed)
Discharging to home

## 2023-06-04 ENCOUNTER — Ambulatory Visit: Payer: Medicare HMO | Admitting: Student

## 2023-06-09 NOTE — Progress Notes (Unsigned)
  Cardiology Office Note:  .   Date:  06/09/2023  ID:  Donald Calderon, DOB Apr 12, 1941, MRN [id number] PCP: Karenann Cai, NP  [facility] Providers Cardiologist:  Dr. Lynnette Caffey Electrophysiologist:  Nobie Putnam, MD {  History of Present Illness: .   Donald Calderon is a 82 y.o. male w/PMHx of DM (w/neuropathy), HTN, HLD, HFpEF, SVT, CHB w/PPM  Monitoring via his PMD noted variable degrees of heart block, saw cardiology, his BB stopped and referred to EP. At this visit he was in high degree AVBlock.  He saw Dr. Jimmey Ralph 05/12/23, pt reported ongoing episodes of weakness, dizziness off the BB. With hx of dilated aorta, felt he would benefit from resuming his BB and recommended he undergo PPM  PPM implanted 05/21/23, stayed overnight at pt/family request given late day procedure  Today's visit is scheduled as a post implant wound check  ROS:   He feels "fine" No recurrent dizzy or lightheaded spells post apcing No wound concerns No CP, palpitations or SOB He has poor vision, his daughter helps with his medicines  Metoprolol is back on his list of medicines   Device information MDT dual chamber PPM implanted 05/20/23   Studies Reviewed: [name]    EKG not done today  DEVICE interrogation done today and reviewed by myself Battery and lead measurements are good Acute implant outputs remain One high AV episode is 1:1, ST/AT 29 seconds 11 AF episodes, EGMs available are tru , longest 9 min 11sec No HVR episodes AP 2.4% VP 0.2%   Echo 08/18/22: LVEF 70 to 75% hyperdynamic LV function.  Mild concentric LVH. Normal RV size and function. Mild aortic regurgitation.  Aortic sclerosis without stenosis. Ascending aortic aneurysm, measuring 47 mm.   Risk Assessment/Calculations:    Physical Exam:   VS:  There were no vitals taken for this visit.   Wt Readings from Last 3 Encounters:  05/21/23 197 lb 1.5 oz (89.4 kg)  05/12/23 197 lb (89.4 kg)  04/23/23 197 lb (89.4 kg)     GEN: Well nourished, well developed in no acute distress NECK: No JVD; No carotid bruits CARDIAC: RRR, no murmurs, rubs, gallops RESPIRATORY:  CTA b/l without rales, wheezing or rhonchi  ABDOMEN: Soft, non-tender, non-distended EXTREMITIES:  No edema; No deformity   PPM site: steri strips are removed without difficulty, skin egdes are well approximated No erythema, edema, heat. No bleeding/drainage There is a stitch very medial corner of the site > trimmed to level of the skin   ASSESSMENT AND PLAN: .    PPM intatct function no programming changes made acute implant outputs remain  Stitch trimmed as above Will have him back in 2 weeks to f/u Remaining LUE activity restrictions reviewed  HTN SVT Thoracic aortic aneurysm Ascending aortic aneurysm stable at 4.5 cm on CT 08/2022  Back on his Toprol C/w Dr. Kris Mouton  5. SCAF Discussed with the patient finding, his stroke risk associated with Afib (score is 5 including HFpEF) No symptoms, awareness of arrhythmia Longest 9 min 11 sec 0.1% In shared decision, will continue to monitor if burden increases will need to consider a/c   Dispo: back in 2 weeks to check wound/stitch, as scheduled otherwise.  Signed, Sheilah Pigeon, PA-C

## 2023-06-10 ENCOUNTER — Encounter: Payer: Self-pay | Admitting: Physician Assistant

## 2023-06-10 ENCOUNTER — Ambulatory Visit: Payer: Medicare HMO | Admitting: Physician Assistant

## 2023-06-10 ENCOUNTER — Ambulatory Visit: Payer: Medicare HMO | Attending: Internal Medicine | Admitting: Physician Assistant

## 2023-06-10 VITALS — BP 146/98 | HR 84 | Ht 69.0 in | Wt 195.6 lb

## 2023-06-10 DIAGNOSIS — Z5189 Encounter for other specified aftercare: Secondary | ICD-10-CM

## 2023-06-10 DIAGNOSIS — I10 Essential (primary) hypertension: Secondary | ICD-10-CM | POA: Diagnosis not present

## 2023-06-10 DIAGNOSIS — Z950 Presence of cardiac pacemaker: Secondary | ICD-10-CM | POA: Diagnosis not present

## 2023-06-10 DIAGNOSIS — I471 Supraventricular tachycardia, unspecified: Secondary | ICD-10-CM

## 2023-06-10 DIAGNOSIS — I4891 Unspecified atrial fibrillation: Secondary | ICD-10-CM

## 2023-06-10 LAB — CUP PACEART INCLINIC DEVICE CHECK
Battery Remaining Longevity: 183 mo
Battery Voltage: 3.23 V
Brady Statistic AP VP Percent: 0.08 %
Brady Statistic AP VS Percent: 2.43 %
Brady Statistic AS VP Percent: 0.16 %
Brady Statistic AS VS Percent: 97.33 %
Brady Statistic RA Percent Paced: 2.4 %
Brady Statistic RV Percent Paced: 0.25 %
Date Time Interrogation Session: 20241226181446
Implantable Lead Connection Status: 753985
Implantable Lead Connection Status: 753985
Implantable Lead Implant Date: 20241205
Implantable Lead Implant Date: 20241205
Implantable Lead Location: 753859
Implantable Lead Location: 753860
Implantable Lead Model: 3830
Implantable Lead Model: 5076
Implantable Pulse Generator Implant Date: 20241205
Lead Channel Impedance Value: 285 Ohm
Lead Channel Impedance Value: 418 Ohm
Lead Channel Impedance Value: 437 Ohm
Lead Channel Impedance Value: 589 Ohm
Lead Channel Pacing Threshold Amplitude: 0.5 V
Lead Channel Pacing Threshold Amplitude: 0.75 V
Lead Channel Pacing Threshold Pulse Width: 0.4 ms
Lead Channel Pacing Threshold Pulse Width: 0.4 ms
Lead Channel Sensing Intrinsic Amplitude: 2.125 mV
Lead Channel Sensing Intrinsic Amplitude: 2.5 mV
Lead Channel Sensing Intrinsic Amplitude: 20 mV
Lead Channel Sensing Intrinsic Amplitude: 22.375 mV
Lead Channel Setting Pacing Amplitude: 3.5 V
Lead Channel Setting Pacing Amplitude: 3.5 V
Lead Channel Setting Pacing Pulse Width: 0.4 ms
Lead Channel Setting Sensing Sensitivity: 1.2 mV
Zone Setting Status: 755011

## 2023-06-10 NOTE — Patient Instructions (Addendum)
Medication Instructions:    Your physician recommends that you continue on your current medications as directed. Please refer to the Current Medication list given to you today.   *If you need a refill on your cardiac medications before your next appointment, please call your pharmacy*   Lab Work: NONE ORDERED  TODAY   If you have labs (blood work) drawn today and your tests are completely normal, you will receive your results only by: MyChart Message (if you have MyChart) OR A paper copy in the mail If you have any lab test that is abnormal or we need to change your treatment, we will call you to review the results.   Testing/Procedures: NONE ORDERED  TODAY    Follow-Up: At [facility], you and your health needs are our priority.  As part of our continuing mission to provide you with exceptional heart care, we have created designated Provider Care Teams.  These Care Teams include your primary Cardiologist (physician) and Advanced Practice Providers (APPs -  Physician Assistants and Nurse Practitioners) who all work together to provide you with the care you need, when you need it.  We recommend signing up for the patient portal called "MyChart".  Sign up information is provided on this After Visit Summary.  MyChart is used to connect with patients for Virtual Visits (Telemedicine).  Patients are able to view lab/test results, encounter notes, upcoming appointments, etc.  Non-urgent messages can be sent to your provider as well.   To learn more about what you can do with MyChart, go to [email or web address].     Your next appointment:  DEVICE CLINIC NEXT WEEK ( CONTACT  CASSIE HALL/ ANGELINE HAMMER FOR EP SCHEDULING ISSUES )   AS SCHEDULED    Provider:   Nobie Putnam, MD    Other Instructions

## 2023-06-17 ENCOUNTER — Ambulatory Visit: Payer: Medicare HMO

## 2023-06-18 ENCOUNTER — Institutional Professional Consult (permissible substitution): Payer: Medicare HMO | Admitting: Cardiology

## 2023-06-24 ENCOUNTER — Ambulatory Visit: Payer: Medicare HMO | Attending: Cardiology

## 2023-06-24 DIAGNOSIS — I442 Atrioventricular block, complete: Secondary | ICD-10-CM

## 2023-06-24 NOTE — Progress Notes (Signed)
 Patient seen in device clinic for wound recheck to assess stitch. Wound appears well healed with no redness, swelling or drainage. Stitch like material was removed from proximal end of incision with just tweezers. No clipping required. Patient advised on wound care and call if any questions or concerns arise.

## 2023-06-24 NOTE — Patient Instructions (Signed)
 ? ?  After Your Pacemaker ? ? ?Monitor your pacemaker site for redness, swelling, and drainage. Call the device clinic at [phone] if you experience these symptoms or fever/chills. ? ?

## 2023-08-20 ENCOUNTER — Ambulatory Visit (INDEPENDENT_AMBULATORY_CARE_PROVIDER_SITE_OTHER): Payer: Medicare HMO

## 2023-08-20 DIAGNOSIS — I442 Atrioventricular block, complete: Secondary | ICD-10-CM | POA: Diagnosis not present

## 2023-08-22 LAB — CUP PACEART REMOTE DEVICE CHECK
Battery Remaining Longevity: 178 mo
Battery Voltage: 3.22 V
Brady Statistic AP VP Percent: 0.27 %
Brady Statistic AP VS Percent: 3.38 %
Brady Statistic AS VP Percent: 0.7 %
Brady Statistic AS VS Percent: 95.65 %
Brady Statistic RA Percent Paced: 3.47 %
Brady Statistic RV Percent Paced: 0.97 %
Date Time Interrogation Session: 20250307051224
Implantable Lead Connection Status: 753985
Implantable Lead Connection Status: 753985
Implantable Lead Implant Date: 20241205
Implantable Lead Implant Date: 20241205
Implantable Lead Location: 753859
Implantable Lead Location: 753860
Implantable Lead Model: 3830
Implantable Lead Model: 5076
Implantable Pulse Generator Implant Date: 20241205
Lead Channel Impedance Value: 266 Ohm
Lead Channel Impedance Value: 380 Ohm
Lead Channel Impedance Value: 399 Ohm
Lead Channel Impedance Value: 570 Ohm
Lead Channel Pacing Threshold Amplitude: 0.625 V
Lead Channel Pacing Threshold Amplitude: 0.875 V
Lead Channel Pacing Threshold Pulse Width: 0.4 ms
Lead Channel Pacing Threshold Pulse Width: 0.4 ms
Lead Channel Sensing Intrinsic Amplitude: 20.75 mV
Lead Channel Sensing Intrinsic Amplitude: 20.75 mV
Lead Channel Sensing Intrinsic Amplitude: 3.25 mV
Lead Channel Sensing Intrinsic Amplitude: 3.25 mV
Lead Channel Setting Pacing Amplitude: 3.25 V
Lead Channel Setting Pacing Amplitude: 3.25 V
Lead Channel Setting Pacing Pulse Width: 0.4 ms
Lead Channel Setting Sensing Sensitivity: 1.2 mV
Zone Setting Status: 755011

## 2023-09-10 ENCOUNTER — Encounter: Payer: Self-pay | Admitting: Cardiology

## 2023-09-10 ENCOUNTER — Ambulatory Visit: Payer: Medicare HMO | Attending: Cardiology | Admitting: Cardiology

## 2023-09-10 VITALS — BP 126/76 | HR 80 | Ht 69.0 in | Wt 202.4 lb

## 2023-09-10 DIAGNOSIS — I442 Atrioventricular block, complete: Secondary | ICD-10-CM | POA: Diagnosis not present

## 2023-09-10 DIAGNOSIS — Z950 Presence of cardiac pacemaker: Secondary | ICD-10-CM | POA: Diagnosis not present

## 2023-09-10 LAB — CUP PACEART INCLINIC DEVICE CHECK
Battery Remaining Longevity: 180 mo
Battery Voltage: 3.22 V
Brady Statistic AP VP Percent: 0.34 %
Brady Statistic AP VS Percent: 2.95 %
Brady Statistic AS VP Percent: 1.38 %
Brady Statistic AS VS Percent: 95.33 %
Brady Statistic RA Percent Paced: 3.13 %
Brady Statistic RV Percent Paced: 1.72 %
Date Time Interrogation Session: 20250328141355
Implantable Lead Connection Status: 753985
Implantable Lead Connection Status: 753985
Implantable Lead Implant Date: 20241205
Implantable Lead Implant Date: 20241205
Implantable Lead Location: 753859
Implantable Lead Location: 753860
Implantable Lead Model: 3830
Implantable Lead Model: 5076
Implantable Pulse Generator Implant Date: 20241205
Lead Channel Impedance Value: 247 Ohm
Lead Channel Impedance Value: 418 Ohm
Lead Channel Impedance Value: 418 Ohm
Lead Channel Impedance Value: 608 Ohm
Lead Channel Pacing Threshold Amplitude: 0.625 V
Lead Channel Pacing Threshold Amplitude: 0.75 V
Lead Channel Pacing Threshold Pulse Width: 0.4 ms
Lead Channel Pacing Threshold Pulse Width: 0.4 ms
Lead Channel Sensing Intrinsic Amplitude: 2.25 mV
Lead Channel Sensing Intrinsic Amplitude: 21.5 mV
Lead Channel Sensing Intrinsic Amplitude: 23.25 mV
Lead Channel Sensing Intrinsic Amplitude: 3.375 mV
Lead Channel Setting Pacing Amplitude: 1.5 V
Lead Channel Setting Pacing Amplitude: 2 V
Lead Channel Setting Pacing Pulse Width: 0.4 ms
Lead Channel Setting Sensing Sensitivity: 1.2 mV
Zone Setting Status: 755011

## 2023-09-10 NOTE — Progress Notes (Signed)
 Electrophysiology Office Note:   Date:  09/10/2023  ID:  Donald Calderon, DOB 1941/05/22, MRN [id number]  Primary Cardiologist: None Electrophysiologist: Nobie Putnam, MD      History of Present Illness:   Donald Calderon is a 83 y.o. male with h/o chronic diastolic heart failure, type II diabetes with neuropathy, thoracic aortic aneurysm and high grade AV block s/p pacemaker on 05/20/23 who is being seen today for follow up evaluation after pacemaker implant.   Discussed the use of AI scribe software for clinical note transcription with the patient, who gave verbal consent to proceed.  History of Present Illness He has a history of intermittent high-grade atrioventricular (AV) blocks, which led to the pacemaker implantation. Currently, the pacemaker serves as a backup for his heart's electrical system, with a low pacing burden and a battery life of approximately 15 years remaining.  Patient reports doing relatively well after pacemaker implant.  He is living independently and is able to do all of his activities.  e notes swelling in his legs, which he attributes to fluid retention, possibly due to excessive salt intake. He is taking Lasix (furosemide) to manage the fluid retention. No shortness of breath, orthopnea, chest pain.  He endorses some occasional twinge of pain at his pacemaker site but is not constant and is nontender to palpation.  Otherwise he has no new or acute complaints today.  Review of systems complete and found to be negative unless listed in HPI.   EP Information / Studies Reviewed:    EKG is ordered today. Personal review as below.  EKG Interpretation Date/Time:  Friday September 10 2023 13:41:32 EDT Ventricular Rate:  81 PR Interval:  198 QRS Duration:  142 QT Interval:  370 QTC Calculation: 429 R Axis:   -29  Text Interpretation: Normal sinus rhythm Right bundle branch block When compared with ECG of 21-May-2023 04:52, Right bundle branch block has replaced  Non-specific intra-ventricular conduction block Minimal criteria for Anterior infarct are no longer Present Confirmed by Nobie Putnam [phone]) on 09/10/2023 9:12:02 PM   EKG 04/23/23: Sinus rhythm with high grade AV block and junctional escape beats   Echo 08/18/22: LVEF 70 to 75% hyperdynamic LV function.  Mild concentric LVH. Normal RV size and function. Mild aortic regurgitation.  Aortic sclerosis without stenosis. Ascending aortic aneurysm, measuring 47 mm.     Physical Exam:   VS:  BP 126/76   Pulse 80   Ht 5\' 9"  (1.753 m)   Wt 202 lb 6.4 oz (91.8 kg)   SpO2 95%   BMI 29.89 kg/m    Wt Readings from Last 3 Encounters:  09/10/23 202 lb 6.4 oz (91.8 kg)  06/10/23 195 lb 9.6 oz (88.7 kg)  05/21/23 197 lb 1.5 oz (89.4 kg)     GEN: Well nourished, well developed in no acute distress NECK: No JVD CARDIAC: Normal rate, regular rhythm.  Left chest pacer pocket well-healed, nontender. 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 -In clinic device interrogation was performed today.  Appropriate device function and stable lead parameters. -AT/AF burden less than 1%.  His longest episode lasted 11 minutes.  We will continue to monitor if burden is increasing then we will discuss starting oral anticoagulation for stroke  prophylaxis. -Continue regular scheduled remote monitoring.  Follow up with Dr. Jimmey Ralph in 12 months  Signed, Nobie Putnam, MD

## 2023-09-10 NOTE — Patient Instructions (Signed)
 Medication Instructions:  Your physician recommends that you continue on your current medications as directed. Please refer to the Current Medication list given to you today.  *If you need a refill on your cardiac medications before your next appointment, please call your pharmacy*  Follow-Up: At [facility], you and your health needs are our priority.  As part of our continuing mission to provide you with exceptional heart care, our providers are all part of one team.  This team includes your primary Cardiologist (physician) and Advanced Practice Providers or APPs (Physician Assistants and Nurse Practitioners) who all work together to provide you with the care you need, when you need it.  Your next appointment:   1 year  Provider:   You may see Nobie Putnam, MD or one of the following Advanced Practice Providers on your designated Care Team:   Francis Dowse, [place] Casimiro Needle "Mardelle Matte" [place], [place] Sherie Don, NP Canary Brim, NP       1st Floor: - Lobby - Registration  - Pharmacy  - Lab - Cafe  2nd Floor: - PV Lab - Diagnostic Testing (echo, CT, nuclear med)  3rd Floor: - Vacant  4th Floor: - TCTS (cardiothoracic surgery) - AFib Clinic - Structural Heart Clinic - Vascular Surgery  - Vascular Ultrasound  5th Floor: - HeartCare Cardiology (general and EP) - Clinical Pharmacy for coumadin, hypertension, lipid, weight-loss medications, and med management appointments    Valet parking services will be available as well.

## 2023-09-21 NOTE — Progress Notes (Signed)
 Remote pacemaker transmission.

## 2023-09-21 NOTE — Addendum Note (Signed)
 Addended by: Elease Etienne A on: 09/21/2023 11:06 AM   Modules accepted: Orders

## 2023-11-19 ENCOUNTER — Ambulatory Visit (INDEPENDENT_AMBULATORY_CARE_PROVIDER_SITE_OTHER): Payer: Medicare HMO

## 2023-11-19 DIAGNOSIS — I442 Atrioventricular block, complete: Secondary | ICD-10-CM | POA: Diagnosis not present

## 2023-11-19 LAB — CUP PACEART REMOTE DEVICE CHECK
Battery Remaining Longevity: 177 mo
Battery Voltage: 3.2 V
Brady Statistic AP VP Percent: 2.54 %
Brady Statistic AP VS Percent: 4.81 %
Brady Statistic AS VP Percent: 4.16 %
Brady Statistic AS VS Percent: 88.49 %
Brady Statistic RA Percent Paced: 7.17 %
Brady Statistic RV Percent Paced: 6.7 %
Date Time Interrogation Session: 20250606060519
Implantable Lead Connection Status: 753985
Implantable Lead Connection Status: 753985
Implantable Lead Implant Date: 20241205
Implantable Lead Implant Date: 20241205
Implantable Lead Location: 753859
Implantable Lead Location: 753860
Implantable Lead Model: 3830
Implantable Lead Model: 5076
Implantable Pulse Generator Implant Date: 20241205
Lead Channel Impedance Value: 266 Ohm
Lead Channel Impedance Value: 380 Ohm
Lead Channel Impedance Value: 513 Ohm
Lead Channel Impedance Value: 570 Ohm
Lead Channel Pacing Threshold Amplitude: 0.75 V
Lead Channel Pacing Threshold Amplitude: 0.875 V
Lead Channel Pacing Threshold Pulse Width: 0.4 ms
Lead Channel Pacing Threshold Pulse Width: 0.4 ms
Lead Channel Sensing Intrinsic Amplitude: 21.25 mV
Lead Channel Sensing Intrinsic Amplitude: 21.25 mV
Lead Channel Sensing Intrinsic Amplitude: 3 mV
Lead Channel Sensing Intrinsic Amplitude: 3 mV
Lead Channel Setting Pacing Amplitude: 1.75 V
Lead Channel Setting Pacing Amplitude: 2 V
Lead Channel Setting Pacing Pulse Width: 0.4 ms
Lead Channel Setting Sensing Sensitivity: 1.2 mV
Zone Setting Status: 755011

## 2023-11-22 ENCOUNTER — Ambulatory Visit: Payer: Self-pay | Admitting: Cardiology

## 2024-01-04 NOTE — Progress Notes (Signed)
 Remote pacemaker transmission.

## 2024-01-07 ENCOUNTER — Other Ambulatory Visit: Payer: Self-pay

## 2024-01-07 ENCOUNTER — Emergency Department (HOSPITAL_COMMUNITY)
Admission: EM | Admit: 2024-01-07 | Discharge: 2024-01-08 | Disposition: A | Discharge status: Home / Self Care | Attending: Emergency Medicine | Admitting: Emergency Medicine

## 2024-01-07 ENCOUNTER — Encounter (HOSPITAL_COMMUNITY): Payer: Self-pay | Admitting: Emergency Medicine

## 2024-01-07 DIAGNOSIS — Z7984 Long term (current) use of oral hypoglycemic drugs: Secondary | ICD-10-CM | POA: Insufficient documentation

## 2024-01-07 DIAGNOSIS — I10 Essential (primary) hypertension: Secondary | ICD-10-CM | POA: Insufficient documentation

## 2024-01-07 DIAGNOSIS — M7989 Other specified soft tissue disorders: Secondary | ICD-10-CM | POA: Diagnosis not present

## 2024-01-07 DIAGNOSIS — E11649 Type 2 diabetes mellitus with hypoglycemia without coma: Secondary | ICD-10-CM | POA: Insufficient documentation

## 2024-01-07 DIAGNOSIS — R600 Localized edema: Secondary | ICD-10-CM | POA: Diagnosis not present

## 2024-01-07 DIAGNOSIS — Z79899 Other long term (current) drug therapy: Secondary | ICD-10-CM | POA: Insufficient documentation

## 2024-01-07 LAB — URINALYSIS, ROUTINE W REFLEX MICROSCOPIC
Bacteria, UA: NONE SEEN
Bilirubin Urine: NEGATIVE
Glucose, UA: >=500 mg/dL — AB
Hgb urine dipstick: NEGATIVE
Ketones, ur: NEGATIVE mg/dL
Leukocytes,Ua: NEGATIVE
Nitrite: NEGATIVE
Protein, ur: NEGATIVE mg/dL
Specific Gravity, Urine: 1.003 — ABNORMAL LOW (ref 1.005–1.030)
pH: 8 (ref 5.0–8.0)

## 2024-01-07 LAB — CBC
HCT: 38.8 % — ABNORMAL LOW (ref 39.0–52.0)
Hemoglobin: 12.5 g/dL — ABNORMAL LOW (ref 13.0–17.0)
MCH: 28 pg (ref 26.0–34.0)
MCHC: 32.2 g/dL (ref 30.0–36.0)
MCV: 86.8 fL (ref 80.0–100.0)
Platelets: 213 K/uL (ref 150–400)
RBC: 4.47 MIL/uL (ref 4.22–5.81)
RDW: 12.8 % (ref 11.5–15.5)
WBC: 7.1 K/uL (ref 4.0–10.5)
nRBC: 0 % (ref 0.0–0.2)

## 2024-01-07 LAB — COMPREHENSIVE METABOLIC PANEL WITH GFR
ALT: 9 U/L (ref 0–44)
AST: 17 U/L (ref 15–41)
Albumin: 3.3 g/dL — ABNORMAL LOW (ref 3.5–5.0)
Alkaline Phosphatase: 52 U/L (ref 38–126)
Anion gap: 8 (ref 5–15)
BUN: 9 mg/dL (ref 8–23)
CO2: 28 mmol/L (ref 22–32)
Calcium: 9.4 mg/dL (ref 8.9–10.3)
Chloride: 95 mmol/L — ABNORMAL LOW (ref 98–111)
Creatinine, Ser: 1.29 mg/dL — ABNORMAL HIGH (ref 0.61–1.24)
GFR, Estimated: 55 mL/min — ABNORMAL LOW (ref >60)
Glucose, Bld: 131 mg/dL — ABNORMAL HIGH (ref 70–99)
Potassium: 4.1 mmol/L (ref 3.5–5.1)
Sodium: 131 mmol/L — ABNORMAL LOW (ref 135–145)
Total Bilirubin: 0.7 mg/dL (ref 0.0–1.2)
Total Protein: 6.4 g/dL — ABNORMAL LOW (ref 6.5–8.1)

## 2024-01-07 LAB — TROPONIN I (HIGH SENSITIVITY)
Troponin I (High Sensitivity): 6 ng/L (ref <18)
Troponin I (High Sensitivity): 6 ng/L (ref <18)

## 2024-01-07 LAB — CBG MONITORING, ED: Glucose-Capillary: 114 mg/dL — ABNORMAL HIGH (ref 70–99)

## 2024-01-07 MED ORDER — GLUCOSE 40 % PO GEL
1.0000 | Freq: Once | ORAL | Status: AC
  Administered 2024-01-07: 31 g via ORAL
  Filled 2024-01-07: qty 1.21

## 2024-01-07 NOTE — ED Triage Notes (Signed)
 Pt via [facility] c/o hypertension; BP on scene 170/90 and CBG 70. Pt received 15g oral glucose with CBG increase to 96. A/O x 4. Hx DM2 on metformin .   BP 160/80 HR 80 O2 98% RA

## 2024-01-07 NOTE — ED Provider Triage Note (Signed)
 Emergency Medicine Provider Triage Evaluation Note  Donald Calderon , a 83 y.o. male  was evaluated in triage.  Pt complains of hypoglycemia and hypertension.  Patient has had blood pressure readings in 170 at home.  Also on arrival EMS check blood glucose which was 70, patient does report feeling weak and thus was given 15 g of oral glucose.  On recheck blood sugar and symptoms had improved.  Blood sugar here is 120.  He feels that he could tolerate oral intake.  He denies any other symptoms like headache chest pain shortness of breath.  Review of Systems  Positive: HTN and hypoglycemia.  Negative: CP, SOB  Physical Exam  BP (!) 154/85   Pulse 87   Temp 98.6 F (37 C) (Oral)   Resp 18   Ht 5' 9 (1.753 m)   Wt 90.7 kg   SpO2 97%   BMI 29.53 kg/m  Gen:   Awake, no distress   Resp:  Normal effort  MSK:   Moves extremities without difficulty  Other:    Medical Decision Making  Medically screening exam initiated at 7:21 PM.  Appropriate orders placed.  CECILIA NISHIKAWA was informed that the remainder of the evaluation will be completed by another provider, this initial triage assessment does not replace that evaluation, and the importance of remaining in the ED until their evaluation is complete.     Shermon Warren SAILOR, [place] 01/07/24 1924

## 2024-01-08 NOTE — Discharge Instructions (Signed)
 Your labs and EKG in the emergency department are all reassuring. You can be safely discharged home and should plan to follow up with your doctor in one week. Take your blood pressure daily at the same time and record the measurement on the form provided. You can take this information with you to follow up with your primary care doctor.

## 2024-01-08 NOTE — ED Provider Notes (Signed)
 [place] [facility]ERGENCY DEPARTMENT AT [facility] Provider Note   CSN: [id number] Arrival date & time: 01/07/24  1756     Patient presents with: Hypertension and Hypoglycemia   Donald Calderon is a 83 y.o. male.   Patient BIB family due to concern that his blood pressure was found to be elevated this morning (7/25). He denies chest pain, SOB. He states he has had some swelling of his legs today, which does not occur often. He is taking his medications as prescribed and there has been no recent change in his regimen. He does not know what his blood pressure was at home.   The history is provided by the patient. No language interpreter was used.  Hypertension  Hypoglycemia      Prior to Admission medications   Medication Sig Start Date End Date Taking? Authorizing Provider  acetaminophen  (TYLENOL ) 325 MG tablet Take 2 tablets (650 mg total) by mouth every 6 (six) hours as needed for mild pain or moderate pain (or Fever >/= 101). 08/21/22   Pokhrel, Laxman, MD  amLODipine  (NORVASC ) 10 MG tablet Take 10 mg by mouth daily. 07/31/22   [provider]  finasteride  (PROSCAR ) 5 MG tablet Take 5 mg by mouth daily. 07/14/22   [provider]  fluticasone  (FLONASE ) 50 MCG/ACT nasal spray Place 1 spray into both nostrils daily as needed for rhinitis. 11/20/20   [provider]  furosemide  (LASIX ) 40 MG tablet TAKE 1 TABLET BY MOUTH DAILY 09/09/22   Chandrasekhar, Mahesh A, MD  gabapentin  (NEURONTIN ) 600 MG tablet Take 600 mg by mouth daily at 6 (six) AM. Pt takes 2 tablet 300mg  each daily. 08/23/23   [provider]  JARDIANCE  10 MG TABS tablet Take 10 mg by mouth daily. 11/30/22   [provider]  levocetirizine (XYZAL) 5 MG tablet Take 5 mg by mouth daily as needed for allergies.    [provider]  losartan  (COZAAR ) 25 MG tablet TAKE 1/2 TABLET(12.5 MG) BY MOUTH DAILY Patient taking differently: Take 12.5 mg by mouth daily. 07/22/22   Thukkani,  Arun K, MD  metFORMIN  (GLUCOPHAGE ) 1000 MG tablet Take 1,000 mg by mouth 2 (two) times daily with a meal. 03/30/19   [provider]  metoprolol  succinate (TOPROL -XL) 25 MG 24 hr tablet Take 1 tablet by mouth daily. 11/30/22   [provider]  pantoprazole  (PROTONIX ) 40 MG tablet Take 40 mg by mouth daily. 11/30/22   [provider]  polyethylene glycol (MIRALAX  / GLYCOLAX ) 17 g packet Take 17 g by mouth daily as needed for mild constipation. 12/05/20   Samtani, Jai-Gurmukh, MD  potassium chloride  (KLOR-CON ) 10 MEQ tablet Take 10 mEq by mouth daily. 09/19/20   [provider]  pravastatin  (PRAVACHOL ) 10 MG tablet Take 1 tablet (10 mg total) by mouth every evening. 02/12/17   Love, Sharlet RAMAN, PA-C  tamsulosin  (FLOMAX ) 0.4 MG CAPS capsule Take 1 capsule (0.4 mg total) by mouth daily after supper. 12/05/20   Samtani, Jai-Gurmukh, MD  albuterol  (PROVENTIL ) (2.5 MG/3ML) 0.083% nebulizer solution Take 3 mLs (2.5 mg total) by nebulization every 2 (two) hours as needed for wheezing. Patient not taking: Reported on 11/28/2020 03/11/17 12/01/20  Billy Junnie HERO, MD    Allergies: Shellfish allergy and Penicillins    Review of Systems  Updated Vital Signs BP (!) 162/103   Pulse 66   Temp 98.4 F (36.9 C)   Resp 16   Ht 5' 9 (1.753 m)   Wt  90.7 kg   SpO2 100%   BMI 29.53 kg/m   Physical Exam Vitals and nursing note reviewed.  Constitutional:      Appearance: He is well-developed.  HENT:     Head: Normocephalic.  Neck:     Vascular: No carotid bruit.  Cardiovascular:     Rate and Rhythm: Normal rate and regular rhythm.     Heart sounds: No murmur heard. Pulmonary:     Effort: Pulmonary effort is normal.     Breath sounds: Normal breath sounds. No wheezing, rhonchi or rales.  Abdominal:     General: Bowel sounds are normal.     Palpations: Abdomen is soft.     Tenderness: There is no abdominal tenderness. There is no guarding or rebound.  Musculoskeletal:         General: Normal range of motion.     Cervical back: Normal range of motion and neck supple.     Comments: Trace edema of bilateral LE's.   Skin:    General: Skin is warm and dry.  Neurological:     General: No focal deficit present.     Mental Status: He is alert and oriented to person, place, and time.     (all labs ordered are listed, but only abnormal results are displayed) Labs Reviewed  COMPREHENSIVE METABOLIC PANEL WITH GFR - Abnormal; Notable for the following components:      Result Value   Sodium 131 (*)    Chloride 95 (*)    Glucose, Bld 131 (*)    Creatinine, Ser 1.29 (*)    Total Protein 6.4 (*)    Albumin  3.3 (*)    GFR, Estimated 55 (*)    All other components within normal limits  CBC - Abnormal; Notable for the following components:   Hemoglobin 12.5 (*)    HCT 38.8 (*)    All other components within normal limits  URINALYSIS, ROUTINE W REFLEX MICROSCOPIC - Abnormal; Notable for the following components:   Color, Urine STRAW (*)    Specific Gravity, Urine 1.003 (*)    Glucose, UA >=500 (*)    All other components within normal limits  CBG MONITORING, ED - Abnormal; Notable for the following components:   Glucose-Capillary 114 (*)    All other components within normal limits  TROPONIN I (HIGH SENSITIVITY)  TROPONIN I (HIGH SENSITIVITY)   Results for orders placed or performed during the hospital encounter of 01/07/24  CBG monitoring, ED   Collection Time: 01/07/24  6:00 PM  Result Value Ref Range   Glucose-Capillary 114 (H) 70 - 99 mg/dL   Comment 1 Notify RN    Comment 2 Document in Chart   Urinalysis, Routine w reflex microscopic -Urine, Clean Catch   Collection Time: 01/07/24  7:22 PM  Result Value Ref Range   Color, Urine STRAW (A) YELLOW   APPearance CLEAR CLEAR   Specific Gravity, Urine 1.003 (L) 1.005 - 1.030   pH 8.0 5.0 - 8.0   Glucose, UA >=500 (A) NEGATIVE mg/dL   Hgb urine dipstick NEGATIVE NEGATIVE   Bilirubin Urine NEGATIVE NEGATIVE    Ketones, ur NEGATIVE NEGATIVE mg/dL   Protein, ur NEGATIVE NEGATIVE mg/dL   Nitrite NEGATIVE NEGATIVE   Leukocytes,Ua NEGATIVE NEGATIVE   RBC / HPF 0-5 0 - 5 RBC/hpf   WBC, UA 0-5 0 - 5 WBC/hpf   Bacteria, UA NONE SEEN NONE SEEN   Squamous Epithelial / HPF 0-5 0 - 5 /HPF  Comprehensive metabolic panel  Collection Time: 01/07/24  7:37 PM  Result Value Ref Range   Sodium 131 (L) 135 - 145 mmol/L   Potassium 4.1 3.5 - 5.1 mmol/L   Chloride 95 (L) 98 - 111 mmol/L   CO2 28 22 - 32 mmol/L   Glucose, Bld 131 (H) 70 - 99 mg/dL   BUN 9 8 - 23 mg/dL   Creatinine, Ser 8.70 (H) 0.61 - 1.24 mg/dL   Calcium  9.4 8.9 - 10.3 mg/dL   Total Protein 6.4 (L) 6.5 - 8.1 g/dL   Albumin  3.3 (L) 3.5 - 5.0 g/dL   AST 17 15 - 41 U/L   ALT 9 0 - 44 U/L   Alkaline Phosphatase 52 38 - 126 U/L   Total Bilirubin 0.7 0.0 - 1.2 mg/dL   GFR, Estimated 55 (L) >60 mL/min   Anion gap 8 5 - 15  CBC   Collection Time: 01/07/24  7:37 PM  Result Value Ref Range   WBC 7.1 4.0 - 10.5 K/uL   RBC 4.47 4.22 - 5.81 MIL/uL   Hemoglobin 12.5 (L) 13.0 - 17.0 g/dL   HCT 61.1 (L) 60.9 - 47.9 %   MCV 86.8 80.0 - 100.0 fL   MCH 28.0 26.0 - 34.0 pg   MCHC 32.2 30.0 - 36.0 g/dL   RDW 87.1 88.4 - 84.4 %   Platelets 213 150 - 400 K/uL   nRBC 0.0 0.0 - 0.2 %  Troponin I (High Sensitivity)   Collection Time: 01/07/24  7:37 PM  Result Value Ref Range   Troponin I (High Sensitivity) 6 <18 ng/L  Troponin I (High Sensitivity)   Collection Time: 01/07/24 10:27 PM  Result Value Ref Range   Troponin I (High Sensitivity) 6 <18 ng/L    EKG: EKG Interpretation Date/Time:  Friday January 07 2024 19:53:38 EDT Ventricular Rate:  74 PR Interval:  226 QRS Duration:  132 QT Interval:  396 QTC Calculation: 439 R Axis:   -24  Text Interpretation: Sinus rhythm with 1st degree A-V block Right bundle branch block Inferior infarct , age undetermined Abnormal ECG No significant change since last tracing Confirmed by Midge Golas  [phone]) on 01/08/2024 1:33:06 AM  Radiology: No results found.   Procedures   Medications Ordered in the ED  dextrose  (GLUTOSE) oral gel 40% (peds > 20kg and adults) (31 g Oral Given 01/07/24 1903)                                    Medical Decision Making This patient presents to the ED for concern of hypertension, this involves an extensive number of treatment options, and is a complaint that carries with it a high risk of complications and morbidity.  The differential diagnosis includes CHF, hypertensive urgency/crisis, CAD/ACS   Co morbidities that complicate the patient evaluation  HTN, T2DM, HLD, GERD, AAA, peripheral edema, pacemaker 2024   Additional history obtained:  Additional history and/or information obtained from chart review, notable for last admission 05/2023. Has regular visits with cardiology   Lab Tests:  I Ordered, and personally interpreted labs.  The pertinent results include:   Cmet: Na 131, Cl 95, Cr 1.29 CBC: no leukocytosis, hgb 12.5, normal plts UA - no infection Troponin neg x 2    Imaging Studies ordered:  I ordered imaging studies including n/a I independently visualized and interpreted imaging which showed n/a I agree with the radiologist interpretation  Cardiac Monitoring:  The patient was maintained on a cardiac monitor.  I personally viewed and interpreted the cardiac monitored which showed an underlying rhythm of:  EKG Interpretation Date/Time:  Friday January 07 2024 19:53:38 EDT Ventricular Rate:  74 PR Interval:  226 QRS Duration:  132 QT Interval:  396 QTC Calculation: 439 R Axis:   -24  Text Interpretation: Sinus rhythm with 1st degree A-V block Right bundle branch block Inferior infarct , age undetermined Abnormal ECG No significant change since last tracing Confirmed by Midge Golas ([id number]) on 01/08/2024 1:33:06 AM     Test Considered:  CXR - no SOB, normal o2 saturation, no cough or fever - CXR not  ordered   Critical Interventions:  N/a   Consultations Obtained:  I requested consultation with the n/a,  and discussed lab and imaging findings as well as pertinent plan - they recommend: n/a   Problem List / ED Course:  Here due to concern for elevated blood pressure The patient denies symptoms. Is well appearing.  VSS, mild HTN He is seen by Dr. Midge. All questions answered. He is felt appropriate for discharge home.   Of note, CBG at home was 70. Given oral glucose. CBG stable through ED encounter.    Reevaluation:  After the interventions noted above, I reevaluated the patient and found that they have :stayed the same   Social Determinants of Health:  Lives with family   Disposition:  After consideration of the diagnostic results and the patients response to treatment, I feel that the patient would benefit from discharge home. .   Risk OTC drugs.        Final diagnoses:  Hypertension, unspecified type    ED Discharge Orders     None          Odell Margit RIGGERS 01/08/24 0215    Midge Golas, MD 01/08/24 [phone]

## 2024-02-18 ENCOUNTER — Ambulatory Visit (INDEPENDENT_AMBULATORY_CARE_PROVIDER_SITE_OTHER): Payer: Medicare HMO

## 2024-02-18 DIAGNOSIS — I442 Atrioventricular block, complete: Secondary | ICD-10-CM

## 2024-02-19 LAB — CUP PACEART REMOTE DEVICE CHECK
Battery Remaining Longevity: 172 mo
Battery Voltage: 3.17 V
Brady Statistic AP VP Percent: 2.61 %
Brady Statistic AP VS Percent: 7.23 %
Brady Statistic AS VP Percent: 5.16 %
Brady Statistic AS VS Percent: 85.01 %
Brady Statistic RA Percent Paced: 9.54 %
Brady Statistic RV Percent Paced: 7.77 %
Date Time Interrogation Session: 20250904222151
Implantable Lead Connection Status: 753985
Implantable Lead Connection Status: 753985
Implantable Lead Implant Date: 20241205
Implantable Lead Implant Date: 20241205
Implantable Lead Location: 753859
Implantable Lead Location: 753860
Implantable Lead Model: 3830
Implantable Lead Model: 5076
Implantable Pulse Generator Implant Date: 20241205
Lead Channel Impedance Value: 266 Ohm
Lead Channel Impedance Value: 361 Ohm
Lead Channel Impedance Value: 437 Ohm
Lead Channel Impedance Value: 532 Ohm
Lead Channel Pacing Threshold Amplitude: 0.75 V
Lead Channel Pacing Threshold Amplitude: 0.75 V
Lead Channel Pacing Threshold Pulse Width: 0.4 ms
Lead Channel Pacing Threshold Pulse Width: 0.4 ms
Lead Channel Sensing Intrinsic Amplitude: 19.875 mV
Lead Channel Sensing Intrinsic Amplitude: 19.875 mV
Lead Channel Sensing Intrinsic Amplitude: 2.75 mV
Lead Channel Sensing Intrinsic Amplitude: 2.75 mV
Lead Channel Setting Pacing Amplitude: 2 V
Lead Channel Setting Pacing Amplitude: 2 V
Lead Channel Setting Pacing Pulse Width: 0.4 ms
Lead Channel Setting Sensing Sensitivity: 1.2 mV
Zone Setting Status: 755011

## 2024-02-20 ENCOUNTER — Ambulatory Visit: Payer: Self-pay | Admitting: Cardiology

## 2024-03-02 NOTE — Progress Notes (Signed)
 Remote PPM Transmission

## 2024-05-19 ENCOUNTER — Ambulatory Visit: Payer: Medicare HMO

## 2024-05-19 DIAGNOSIS — I4891 Unspecified atrial fibrillation: Secondary | ICD-10-CM

## 2024-05-22 LAB — CUP PACEART REMOTE DEVICE CHECK
Battery Remaining Longevity: 167 mo
Battery Voltage: 3.14 V
Brady Statistic AP VP Percent: 4.29 %
Brady Statistic AP VS Percent: 2.91 %
Brady Statistic AS VP Percent: 32.6 %
Brady Statistic AS VS Percent: 60.19 %
Brady Statistic RA Percent Paced: 6.55 %
Brady Statistic RV Percent Paced: 36.89 %
Date Time Interrogation Session: 20251204233447
Implantable Lead Connection Status: 753985
Implantable Lead Connection Status: 753985
Implantable Lead Implant Date: 20241205
Implantable Lead Implant Date: 20241205
Implantable Lead Location: 753859
Implantable Lead Location: 753860
Implantable Lead Model: 3830
Implantable Lead Model: 5076
Implantable Pulse Generator Implant Date: 20241205
Lead Channel Impedance Value: 266 Ohm
Lead Channel Impedance Value: 361 Ohm
Lead Channel Impedance Value: 456 Ohm
Lead Channel Impedance Value: 513 Ohm
Lead Channel Pacing Threshold Amplitude: 0.875 V
Lead Channel Pacing Threshold Amplitude: 0.875 V
Lead Channel Pacing Threshold Pulse Width: 0.4 ms
Lead Channel Pacing Threshold Pulse Width: 0.4 ms
Lead Channel Sensing Intrinsic Amplitude: 20.875 mV
Lead Channel Sensing Intrinsic Amplitude: 20.875 mV
Lead Channel Sensing Intrinsic Amplitude: 3.25 mV
Lead Channel Sensing Intrinsic Amplitude: 3.25 mV
Lead Channel Setting Pacing Amplitude: 1.75 V
Lead Channel Setting Pacing Amplitude: 2 V
Lead Channel Setting Pacing Pulse Width: 0.4 ms
Lead Channel Setting Sensing Sensitivity: 1.2 mV
Zone Setting Status: 755011

## 2024-05-23 NOTE — Progress Notes (Signed)
 Remote PPM Transmission

## 2024-05-25 ENCOUNTER — Other Ambulatory Visit: Payer: Self-pay | Admitting: Surgery

## 2024-05-25 DIAGNOSIS — I7121 Aneurysm of the ascending aorta, without rupture: Secondary | ICD-10-CM

## 2024-06-02 ENCOUNTER — Ambulatory Visit: Payer: Self-pay | Admitting: Cardiology

## 2024-06-21 ENCOUNTER — Ambulatory Visit
